# Patient Record
Sex: Female | Born: 1940
Health system: Southern US, Community
[De-identification: ages and names within clinical notes are randomized; demographics above are authoritative.]

## PROBLEM LIST (undated history)

## (undated) DIAGNOSIS — I10 Essential (primary) hypertension: Secondary | ICD-10-CM

## (undated) DIAGNOSIS — I209 Angina pectoris, unspecified: Secondary | ICD-10-CM

## (undated) DIAGNOSIS — E039 Hypothyroidism, unspecified: Secondary | ICD-10-CM

## (undated) DIAGNOSIS — K219 Gastro-esophageal reflux disease without esophagitis: Secondary | ICD-10-CM

## (undated) DIAGNOSIS — J449 Chronic obstructive pulmonary disease, unspecified: Secondary | ICD-10-CM

## (undated) DIAGNOSIS — M810 Age-related osteoporosis without current pathological fracture: Secondary | ICD-10-CM

## (undated) DIAGNOSIS — M4316 Spondylolisthesis, lumbar region: Secondary | ICD-10-CM

## (undated) DIAGNOSIS — I350 Nonrheumatic aortic (valve) stenosis: Secondary | ICD-10-CM

## (undated) DIAGNOSIS — M1990 Unspecified osteoarthritis, unspecified site: Secondary | ICD-10-CM

## (undated) DIAGNOSIS — K449 Diaphragmatic hernia without obstruction or gangrene: Secondary | ICD-10-CM

## (undated) DIAGNOSIS — E785 Hyperlipidemia, unspecified: Secondary | ICD-10-CM

## (undated) DIAGNOSIS — R0600 Dyspnea, unspecified: Secondary | ICD-10-CM

## (undated) DIAGNOSIS — C801 Malignant (primary) neoplasm, unspecified: Secondary | ICD-10-CM

## (undated) DIAGNOSIS — J189 Pneumonia, unspecified organism: Secondary | ICD-10-CM

## (undated) HISTORY — DX: Chronic obstructive pulmonary disease, unspecified: J44.9

## (undated) HISTORY — DX: Angina pectoris, unspecified: I20.9

## (undated) HISTORY — PX: APPENDECTOMY: SHX54

## (undated) HISTORY — DX: Unspecified osteoarthritis, unspecified site: M19.90

## (undated) HISTORY — PX: CERVICAL DISC ARTHROPLASTY: SHX587

## (undated) HISTORY — DX: Age-related osteoporosis without current pathological fracture: M81.0

## (undated) HISTORY — PX: COLONOSCOPY: SHX174

## (undated) HISTORY — DX: Essential (primary) hypertension: I10

## (undated) HISTORY — DX: Diaphragmatic hernia without obstruction or gangrene: K44.9

## (undated) HISTORY — DX: Hyperlipidemia, unspecified: E78.5

## (undated) HISTORY — PX: CATARACT EXTRACTION, BILATERAL: SHX1313

## (undated) HISTORY — DX: Gastro-esophageal reflux disease without esophagitis: K21.9

## (undated) HISTORY — DX: Malignant (primary) neoplasm, unspecified: C80.1

---

## 1999-04-24 ENCOUNTER — Other Ambulatory Visit: Admission: RE | Admit: 1999-04-24 | Discharge: 1999-04-24 | Payer: Self-pay | Admitting: Obstetrics and Gynecology

## 2000-06-15 ENCOUNTER — Encounter: Admission: RE | Admit: 2000-06-15 | Discharge: 2000-06-15 | Payer: Self-pay | Admitting: Internal Medicine

## 2000-06-15 ENCOUNTER — Encounter: Payer: Self-pay | Admitting: Internal Medicine

## 2000-07-19 ENCOUNTER — Encounter (INDEPENDENT_AMBULATORY_CARE_PROVIDER_SITE_OTHER): Payer: Self-pay

## 2000-07-19 ENCOUNTER — Ambulatory Visit (HOSPITAL_COMMUNITY): Admission: RE | Admit: 2000-07-19 | Discharge: 2000-07-19 | Payer: Self-pay | Admitting: *Deleted

## 2000-07-19 HISTORY — PX: ESOPHAGOGASTRODUODENOSCOPY: SHX1529

## 2001-07-25 ENCOUNTER — Other Ambulatory Visit: Admission: RE | Admit: 2001-07-25 | Discharge: 2001-07-25 | Payer: Self-pay | Admitting: Obstetrics and Gynecology

## 2003-07-03 ENCOUNTER — Other Ambulatory Visit: Admission: RE | Admit: 2003-07-03 | Discharge: 2003-07-03 | Payer: Self-pay | Admitting: Obstetrics and Gynecology

## 2003-08-08 ENCOUNTER — Ambulatory Visit (HOSPITAL_COMMUNITY): Admission: RE | Admit: 2003-08-08 | Discharge: 2003-08-08 | Payer: Self-pay | Admitting: Cardiology

## 2004-12-22 ENCOUNTER — Other Ambulatory Visit: Admission: RE | Admit: 2004-12-22 | Discharge: 2004-12-22 | Payer: Self-pay | Admitting: Obstetrics and Gynecology

## 2007-04-03 ENCOUNTER — Ambulatory Visit (HOSPITAL_COMMUNITY): Admission: RE | Admit: 2007-04-03 | Discharge: 2007-04-04 | Payer: Self-pay | Admitting: Orthopaedic Surgery

## 2009-07-03 ENCOUNTER — Encounter: Admission: RE | Admit: 2009-07-03 | Discharge: 2009-07-03 | Payer: Self-pay | Admitting: Internal Medicine

## 2009-07-23 ENCOUNTER — Inpatient Hospital Stay (HOSPITAL_COMMUNITY): Admission: RE | Admit: 2009-07-23 | Discharge: 2009-07-23 | Payer: Self-pay | Admitting: Neurosurgery

## 2009-10-23 ENCOUNTER — Encounter (HOSPITAL_COMMUNITY): Admission: RE | Admit: 2009-10-23 | Discharge: 2010-01-09 | Payer: Self-pay | Admitting: Internal Medicine

## 2009-11-18 ENCOUNTER — Encounter: Admission: RE | Admit: 2009-11-18 | Discharge: 2009-11-18 | Payer: Self-pay | Admitting: Neurosurgery

## 2010-01-23 ENCOUNTER — Encounter (HOSPITAL_COMMUNITY): Admission: RE | Admit: 2010-01-23 | Discharge: 2010-04-02 | Payer: Self-pay | Admitting: Internal Medicine

## 2010-04-09 ENCOUNTER — Encounter (HOSPITAL_COMMUNITY)
Admission: RE | Admit: 2010-04-09 | Discharge: 2010-06-02 | Payer: Self-pay | Source: Home / Self Care | Attending: Internal Medicine | Admitting: Internal Medicine

## 2010-05-25 ENCOUNTER — Encounter
Admission: RE | Admit: 2010-05-25 | Discharge: 2010-05-25 | Payer: Self-pay | Source: Home / Self Care | Attending: Gastroenterology | Admitting: Gastroenterology

## 2010-07-20 ENCOUNTER — Ambulatory Visit (HOSPITAL_COMMUNITY): Payer: Medicare Other | Attending: Internal Medicine

## 2010-07-20 DIAGNOSIS — M810 Age-related osteoporosis without current pathological fracture: Secondary | ICD-10-CM | POA: Insufficient documentation

## 2010-07-26 LAB — BASIC METABOLIC PANEL
BUN: 12 mg/dL (ref 6–23)
CO2: 29 mEq/L (ref 19–32)
Calcium: 9.8 mg/dL (ref 8.4–10.5)
Chloride: 103 mEq/L (ref 96–112)
Creatinine, Ser: 1.06 mg/dL (ref 0.4–1.2)
GFR calc Af Amer: >60 mL/min (ref >60)
GFR calc non Af Amer: 51 mL/min — ABNORMAL LOW (ref >60)
Glucose, Bld: 77 mg/dL (ref 70–99)
Potassium: 3.8 mEq/L (ref 3.5–5.1)
Sodium: 139 mEq/L (ref 135–145)

## 2010-07-26 LAB — CBC
HCT: 36.6 % (ref 36.0–46.0)
Hemoglobin: 12.2 g/dL (ref 12.0–15.0)
MCHC: 33.4 g/dL (ref 30.0–36.0)
MCV: 81.7 fL (ref 78.0–100.0)
Platelets: 473 10*3/uL — ABNORMAL HIGH (ref 150–400)
RBC: 4.48 MIL/uL (ref 3.87–5.11)
RDW: 17.3 % — ABNORMAL HIGH (ref 11.5–15.5)
WBC: 9.9 10*3/uL (ref 4.0–10.5)

## 2010-07-26 LAB — SURGICAL PCR SCREEN
MRSA, PCR: NEGATIVE
Staphylococcus aureus: NEGATIVE

## 2010-09-15 NOTE — Op Note (Signed)
Joann Barnes, Joann Barnes                 ACCOUNT NO.:  [id number]   MEDICAL RECORD NO.:  [id number]          PATIENT TYPE:  OIB   LOCATION:  5033                         FACILITY:  [facility]   PHYSICIAN:  Mark C. Ophelia Charter, M.D.    DATE OF BIRTH:  [birth year]   DATE OF PROCEDURE:  04/03/2007  DATE OF DISCHARGE:                               OPERATIVE REPORT   PREOPERATIVE DIAGNOSIS:  C4-5 spondylosis.   POSTOPERATIVE DIAGNOSIS:  C4-5 spondylosis.   PROCEDURE:  C4-5 anterior cervical diskectomy and fusion with allograft  and plating.   SURGEON:  Mark C. Ophelia Charter, M.D.   ASSISTANT:  Wende Neighbors, P.A.   ANESTHESIA:  GOT plus Marcaine skin local 5 mL.   ESTIMATED BLOOD LOSS:  Minimal.   DRAINS:  One Hemovac neck.   This patient had a previous cervical fusion at C5-6 and had  pseudoarthrosis and eventually had posterior wiring.  She has done well  for many years, then developed progressive spondylosis above the fusion,  with pain primarily axial and neck and shoulder pain.  MRI scan showed  spondylitic changes above the fusion.  All other levels looked good.  Her fusion had healed solidly after the posterior grafting and wiring.   PROCEDURE:  After induction of general anesthesia and ___HH_______  traction, prepping the neck with DuraPrep, the area was squared with  towels.  Betadine and VyDrape applied.  Sterile Mayo stand at the head,  followed by sheets and drapes.  The old incision  was used, after time-  out procedure was discussed and confirmed with all in the room.  Old  incision was opened.  Platysma was elevated proximally.  Blunt  dissection down and above the omohyoid was performed on the level of  longus colli.  There was a prominent band over some scar tissue, running  directly over the periosteum.  A needle was placed in a prominent spur.  It turned out this was the C3-4; one level above the operative level --  which made it even easier since this was closer in line with the  skin  incision.   After moving the needle one level down; cross-table lateral x-ray  confirmed this was C4-5.  A portion of the disk was removed with a 15  scalpel blade, and then ___teethed_______ Cloward plates were placed  right and left; smooth blades up and down.  Longus coli was used to  protect the esophagus on the right side.  Diskectomy was performed with  a scalpel blade and Cloward curets.  Following curets,  power bur and  then rasp.  Operative microscope was draped and brought in.  The  posterior longitudinal ligament was taken down.  Large spurs were  removed.  The disk space was relatively narrow and would barely allow  the 4 mm bur to fit.  Once spurs were trimmed back, the dura was  visualized and uncovertebral joints were stripped.  The bone spurs were  removed from that area.  Traction was applied by the C.R.N.A. and the 6  mm graft was inserted with tight fit; countersunk 1-2  mm.  Then a 16 mm  plate was selected.  I would confirm by checking AP and lateral  fluoroscopy and all four holes were then filled.  The hole where the  prong had been holding did not require hand drilling, and just had screw  insertion.  AP and lateral fluoroscopic pictures confirmed excellent  position and alignment.  The patient tolerated the procedure  well.  The wound was irrigated.  Hemovac was placed with in-and-out  technique in line with the skin incision.  The platysma closed with 3-0  Vicryl; 4-0 Vicryl subcuticular closure.  Tincture of Benzoin, Steri-  Strips, 4x4s, soft collar and tape.  The patient tolerated the procedure  well and taken to recovery room.      Mark C. Ophelia Charter, M.D.  Electronically Signed     MCY/MEDQ  D:  04/03/2007  T:  04/04/2007  Job:  [id number]

## 2010-09-18 NOTE — Procedures (Signed)
[facility]  Patient:    Joann Barnes, Joann Barnes                        MRN: [id number] Proc. Date: 07/19/00 Adm. Date:  [id number] Attending:  Sabino Gasser                           Procedure Report  PROCEDURE:  Upper endoscopy with biopsy.  INDICATION FOR PROCEDURE:  Reflux.  ANESTHESIA:  Demerol 70 mg, Versed 7 mg.  DESCRIPTION OF PROCEDURE:  With the patient mildly sedated in the left lateral decubitus position, the Olympus video endoscope was inserted in the mouth and passed under direct vision through the esophagus which appeared normal. The distal esophagus was approached and appeared relatively normal. We photographed this area and entered into the stomach. The fundus, body, antrum, duodenal bulb, and second portion of the duodenum were all well visualized and appeared normal. From this point, the endoscope was slowly withdrawn taking circumferential views of the entire duodenal mucosa until the endoscope was then pulled back into the stomach, placed in retroflexion to view the stomach from below and the gastroesophageal junction was widely open allowing a view of the distal esophagus from the stomach and this was photographed. The endoscope was then straightened and withdrawn taking circumferential views of the entire gastric and subsequently esophageal mucosa stopping only in the distal esophagus to biopsy the GE junction area to rule out Barretts. The endoscope was withdrawn. The patients vital signs and pulse oximeter remained stable. The patient tolerated the procedure well without apparent complications.  FINDINGS:  Broad Barretts esophagus by biopsy although this is probably normal. Large hiatal hernia as evidenced above.  PLAN:  Continue aggressive antireflux measures. Will have the patient follow-up with me as an outpatient. DD:  07/19/00 TD:  07/20/00 Job: [id number]

## 2010-10-21 ENCOUNTER — Encounter: Payer: Self-pay | Admitting: Internal Medicine

## 2010-10-22 ENCOUNTER — Ambulatory Visit (INDEPENDENT_AMBULATORY_CARE_PROVIDER_SITE_OTHER): Payer: Medicare Other | Admitting: Internal Medicine

## 2010-10-22 ENCOUNTER — Encounter: Payer: Self-pay | Admitting: Internal Medicine

## 2010-10-22 VITALS — BP 140/66 | HR 86 | Temp 98.3°F | Ht 66.0 in | Wt 158.8 lb

## 2010-10-22 DIAGNOSIS — R0600 Dyspnea, unspecified: Secondary | ICD-10-CM | POA: Insufficient documentation

## 2010-10-22 DIAGNOSIS — R0989 Other specified symptoms and signs involving the circulatory and respiratory systems: Secondary | ICD-10-CM

## 2010-10-22 DIAGNOSIS — F17200 Nicotine dependence, unspecified, uncomplicated: Secondary | ICD-10-CM

## 2010-10-22 DIAGNOSIS — R0609 Other forms of dyspnea: Secondary | ICD-10-CM

## 2010-10-22 NOTE — Assessment & Plan Note (Addendum)
Likely Due to copd or deconditioning or both. Doubt angina equivalent. Med technique and compliance could be issue as well. Did not desaturate walking 185 feet x 3 laps  Plan Full PFT Fu with NP for med calendar and PFT review and medication adjustment REfer pulmonary rehab for deconditoning AT fu will check alpha 1

## 2010-10-22 NOTE — Assessment & Plan Note (Signed)
3 min counseling done. For now work on quittin by self. Referred to [facility]. Counseling at rehab should help too. At fu wil discuss zyban

## 2010-10-22 NOTE — Progress Notes (Signed)
Subjective:    Patient ID: Joann Barnes, female    DOB: [birth year], 70 y.o.   MRN: [id number]  Shortness of Breath This is a new (70 year old overweight female.  Smoker 1/2 ppd minium since age 51) problem. The current episode started more than 1 year ago (Insidous onset, several years ago). The problem occurs daily. The problem has been gradually worsening (Progressively worse past few months. Unable to breahe as deep as before. Currently dyspnic walking to mail box and back. Notices it dueing yard work as well). Pertinent negatives include no abdominal pain, chest pain, claudication, coryza, ear pain, fever, headaches, hemoptysis, leg swelling, neck pain, orthopnea, PND, rash, rhinorrhea, sore throat, sputum production, swollen glands, syncope, vomiting or wheezing. The symptoms are aggravated by exercise, any activity and weather changes (heat makes dyspnea worse. Walking to mail box and back makes dyspnea worse). Associated symptoms comments: Feels she is unable to take a deep breath. Asspociated cough + but feels currently unable to expectorate. Reports normal cardiac stress test by Dr Aleen Campi 2005 approximately. Reports normal resting annual ekg. Risk factors include smoking. She has tried ipratropium inhalers and oral steroids (is on spiriva currently along with albuterol but no relief. has been on prednisone bursts with cold and cough which has helped  always. Sh e is confused about her medications) for the symptoms. The treatment provided no relief. There is no history of allergies, aspirin allergies, asthma, bronchiolitis, CAD, chronic lung disease, COPD, DVT, a heart failure, PE, pneumonia or a recent surgery. (Cxr nov 2008 reviewed - suggests hyperinflation. CT abd lung cut  05/25/2010 - kyphotic, possible emphsyemai in lung field)  Walking 185 feet x 3 laps in office she did not desaturate. Though she denies COPD, I noticed on med list she is on spirivia and advair; I did not get a chance to  question her about it   Review of Systems  Constitutional: Negative for fever and unexpected weight change.  HENT: Negative for ear pain, nosebleeds, congestion, sore throat, rhinorrhea, sneezing, trouble swallowing, neck pain, dental problem, postnasal drip and sinus pressure.   Eyes: Negative for redness and itching.  Respiratory: Positive for cough and shortness of breath. Negative for hemoptysis, sputum production, chest tightness and wheezing.   Cardiovascular: Negative for chest pain, palpitations, orthopnea, claudication, leg swelling, syncope and PND.  Gastrointestinal: Negative for nausea, vomiting and abdominal pain.  Genitourinary: Negative for dysuria.  Musculoskeletal: Positive for joint swelling.  Skin: Negative for rash.  Neurological: Negative for headaches.  Hematological: Does not bruise/bleed easily.  Psychiatric/Behavioral: Negative for dysphoric mood. The patient is not nervous/anxious.        Objective:   Physical Exam  Vitals reviewed. Constitutional: She is oriented to person, place, and time. She appears well-developed and well-nourished. No distress.  HENT:  Head: Normocephalic and atraumatic.  Right Ear: External ear normal.  Left Ear: External ear normal.  Mouth/Throat: Oropharynx is clear and moist. No oropharyngeal exudate.  Eyes: Conjunctivae and EOM are normal. Pupils are equal, round, and reactive to light. Right eye exhibits no discharge. Left eye exhibits no discharge. No scleral icterus.  Neck: Normal range of motion. Neck supple. No JVD present. No tracheal deviation present. No thyromegaly present.  Cardiovascular: Normal rate, regular rhythm, normal heart sounds and intact distal pulses.  Exam reveals no gallop and no friction rub.   No murmur heard. Pulmonary/Chest: Effort normal. No respiratory distress. She has wheezes. She has no rales. She exhibits no tenderness.  No distress Faint wheeze only Full sentences No acc muscle use    Abdominal: Soft. Bowel sounds are normal. She exhibits no distension and no mass. There is no tenderness. There is no rebound and no guarding.  Musculoskeletal: Normal range of motion. She exhibits no edema and no tenderness.  Lymphadenopathy:    She has no cervical adenopathy.  Neurological: She is alert and oriented to person, place, and time. She has normal reflexes. No cranial nerve deficit. She exhibits normal muscle tone. Coordination normal.  Skin: Skin is warm and dry. No rash noted. She is not diaphoretic. No erythema. No pallor.  Psychiatric: She has a normal mood and affect. Her behavior is normal. Judgment and thought content normal.          Assessment & Plan:

## 2010-10-22 NOTE — Patient Instructions (Signed)
#  Shortness of Breath -You likely have copd -You need to breathing test called PFT -Followup PFT results with my nurse Tammy. Based on results, she will adjust your medications -When you see Tammy please bring all your medications with you for her to do a med calendar and adjust your medications -I am also setting up pulmonary rehab exercise program for you which will help you #smoking  - work on quitting by self  - my nurse will give you contact for [facility] - at followup will discuss zyban #Followoup   With NP Tammy after PFT and for med calendar

## 2010-10-28 ENCOUNTER — Ambulatory Visit (HOSPITAL_COMMUNITY)
Admission: RE | Admit: 2010-10-28 | Discharge: 2010-10-28 | Disposition: A | Discharge status: Home / Self Care | Payer: Medicare Other | Source: Ambulatory Visit | Attending: Internal Medicine | Admitting: Internal Medicine

## 2010-10-28 DIAGNOSIS — M810 Age-related osteoporosis without current pathological fracture: Secondary | ICD-10-CM | POA: Insufficient documentation

## 2010-11-09 ENCOUNTER — Ambulatory Visit (INDEPENDENT_AMBULATORY_CARE_PROVIDER_SITE_OTHER): Payer: Medicare Other | Admitting: Internal Medicine

## 2010-11-09 DIAGNOSIS — R0989 Other specified symptoms and signs involving the circulatory and respiratory systems: Secondary | ICD-10-CM

## 2010-11-09 DIAGNOSIS — R0609 Other forms of dyspnea: Secondary | ICD-10-CM

## 2010-11-09 DIAGNOSIS — R0600 Dyspnea, unspecified: Secondary | ICD-10-CM

## 2010-11-09 DIAGNOSIS — F17200 Nicotine dependence, unspecified, uncomplicated: Secondary | ICD-10-CM

## 2010-11-09 LAB — PULMONARY FUNCTION TEST

## 2010-11-09 NOTE — Progress Notes (Signed)
PFT done today. 

## 2010-11-16 ENCOUNTER — Ambulatory Visit (INDEPENDENT_AMBULATORY_CARE_PROVIDER_SITE_OTHER)
Admission: RE | Admit: 2010-11-16 | Discharge: 2010-11-16 | Disposition: A | Discharge status: Home / Self Care | Payer: Medicare Other | Source: Ambulatory Visit | Attending: Adult Health | Admitting: Adult Health

## 2010-11-16 ENCOUNTER — Encounter: Payer: Self-pay | Admitting: Adult Health

## 2010-11-16 ENCOUNTER — Ambulatory Visit (INDEPENDENT_AMBULATORY_CARE_PROVIDER_SITE_OTHER): Payer: Medicare Other | Admitting: Adult Health

## 2010-11-16 ENCOUNTER — Other Ambulatory Visit: Payer: Self-pay | Admitting: *Deleted

## 2010-11-16 VITALS — BP 132/74 | HR 74 | Temp 96.9°F | Ht 66.0 in | Wt 157.4 lb

## 2010-11-16 DIAGNOSIS — J449 Chronic obstructive pulmonary disease, unspecified: Secondary | ICD-10-CM

## 2010-11-16 MED ORDER — FLUTICASONE PROPIONATE HFA 110 MCG/ACT IN AERO: 2.0000 | INHALATION_SPRAY | Freq: Two times a day (BID) | RESPIRATORY_TRACT | Status: DC

## 2010-11-16 MED ORDER — ATORVASTATIN CALCIUM 20 MG PO TABS: 20.0000 mg | ORAL_TABLET | Freq: Every day | ORAL | Status: DC

## 2010-11-16 MED ORDER — BUPROPION HCL ER (SMOKING DET) 150 MG PO TB12: 150.0000 mg | ORAL_TABLET | Freq: Two times a day (BID) | ORAL | Status: AC

## 2010-11-16 MED ORDER — OMEPRAZOLE 20 MG PO CPDR: 20.0000 mg | DELAYED_RELEASE_CAPSULE | Freq: Every day | ORAL | Status: DC

## 2010-11-16 MED ORDER — IBANDRONATE SODIUM 3 MG/3ML IV SOLN: 3.0000 mg | INTRAVENOUS | Status: DC

## 2010-11-16 MED ORDER — IPRATROPIUM-ALBUTEROL 0.5-2.5 (3) MG/3ML IN SOLN: 3.0000 mL | RESPIRATORY_TRACT | Status: DC | PRN

## 2010-11-16 MED ORDER — BUDESONIDE 3 MG PO CP24: ORAL_CAPSULE | ORAL | Status: DC

## 2010-11-16 MED ORDER — DM-GUAIFENESIN ER 30-600 MG PO TB12
1.0000 | ORAL_TABLET | Freq: Two times a day (BID) | ORAL | Status: AC | PRN
Start: 2010-11-16 — End: 2010-11-26

## 2010-11-16 MED ORDER — ALBUTEROL 90 MCG/ACT IN AERS: 2.0000 | INHALATION_SPRAY | RESPIRATORY_TRACT | Status: DC | PRN

## 2010-11-16 NOTE — Patient Instructions (Signed)
Begin Zyban 150mg  Twice daily  For smoking cesstation for 3 months  Set taget date to quit and then  No smoking after 1 week .  Use sugarless candy , gum, stress reducers .  Continue on Spiriva daily  Most important goal is to quit smoking.  follow up Dr. Marchelle Gearing in 4-6 weeks

## 2010-11-16 NOTE — Progress Notes (Signed)
Subjective:    Patient ID: Joann Barnes, female    DOB: [birth year], 70 y.o.   MRN: [id number]  HPI 70 yo female, smoker, began at age 51 ~1PPD   seen for initial consult 10/22/10 for dyspnea PFT 11/09/2010>>FEV1 2.00 l/m  (92%), ratio 68, no sign. Change with SABA Decreased mid flows 42%, DLCO 66%    10/22/10 Initial Consult  This is a new (70 year old overweight female.  Smoker 1/2 ppd minium since age 89) problem. The current episode started more than 1 year ago (Insidous onset, several years ago). The problem occurs daily. The problem has been gradually worsening (Progressively worse past few months. Unable to breahe as deep as before. Currently dyspnic walking to mail box and back. Notices it dueing yard work as well). Pertinent negatives include no abdominal pain, chest pain, claudication, coryza, ear pain, fever, headaches, hemoptysis, leg swelling, neck pain, orthopnea, PND, rash, rhinorrhea, sore throat, sputum production, swollen glands, syncope, vomiting or wheezing. The symptoms are aggravated by exercise, any activity and weather changes (heat makes dyspnea worse. Walking to mail box and back makes dyspnea worse). Associated symptoms comments: Feels she is unable to take a deep breath. Asspociated cough + but feels currently unable to expectorate. Reports normal cardiac stress test by Dr Aleen Campi 2005 approximately. Reports normal resting annual ekg. Risk factors include smoking. She has tried ipratropium inhalers and oral steroids (is on spiriva currently along with albuterol but no relief. has been on prednisone bursts with cold and cough which has helped  always. Sh e is confused about her medications) for the symptoms. The treatment provided no relief. There is no history of allergies, aspirin allergies, asthma, bronchiolitis, CAD, chronic lung disease, COPD, DVT, a heart failure, PE, pneumonia or a recent surgery. (Cxr nov 2008 reviewed - suggests hyperinflation. CT abd lung cut  05/25/2010 -  kyphotic, possible emphsyemai in lung field)  Walking 185 feet x 3 laps in office she did not desaturate. Though she denies COPD, I noticed on med list she is on spirivia and advair; I did not get a chance to question her about it >>referred to Pulm . Rehab.   11/16/2010 Follow up and Med review.  Pt returns for follow up and med review.  We reviewed all her meds and organized med into a med calendar with pt education .  Pt wants to start on Zyban to help with quitting smoking. She has tried Chantix in past with intolerance.  Nicotine patches cause localized rash.  She is currently on flovent and spiriva daily . Last week she had PFT which showed preserved lung fxn . Mid flows were decreased   PFT 11/09/2010>>FEV1 2.00 l/m  (92%), ratio 68, no sign. Change with SABA Decreased mid flows 42%, DLCO 66%        Review of Systems Constitutional:   No  weight loss, night sweats,  Fevers, chills, fatigue, or  lassitude.  HEENT:   No headaches,  Difficulty swallowing,  Tooth/dental problems, or  Sore throat,                No sneezing, itching, ear ache, nasal congestion, post nasal drip,   CV:  No chest pain,  Orthopnea, PND, swelling in lower extremities, anasarca, dizziness, palpitations, syncope.   GI  No heartburn, indigestion, abdominal pain, nausea, vomiting, diarrhea, change in bowel habits, loss of appetite, bloody stools.   Resp:    No coughing up of blood.  No change in color of mucus.  No wheezing.  No chest wall deformity  Skin: no rash or lesions.  GU: no dysuria, change in color of urine, no urgency or frequency.  No flank pain, no hematuria   MS:  No joint pain or swelling.  No decreased range of motion.  No back pain.  Psych:  No change in mood or affect. No depression or anxiety.  No memory loss.         Objective:   Physical Exam GEN: A/Ox3; pleasant , NAD,   HEENT:  [place]/AT,  EACs-clear, TMs-wnl, NOSE-clear, THROAT-clear, no lesions, no postnasal drip or exudate  noted.   NECK:  Supple w/ fair ROM; no JVD; normal carotid impulses w/o bruits; no thyromegaly or nodules palpated; no lymphadenopathy.  RESP  Clear  P & A; w/o, wheezes/ rales/ or rhonchi.no accessory muscle use, no dullness to percussion  CARD:  RRR, no m/r/g  , no peripheral edema, pulses intact, no cyanosis or clubbing.  GI:   Soft & nt; nml bowel sounds; no organomegaly or masses detected.  Musco: Warm bil, no deformities or joint swelling noted.   Neuro: alert, no focal deficits noted.    Skin: Warm, no lesions or rashes         Assessment & Plan:

## 2010-11-16 NOTE — Progress Notes (Signed)
7.16.12 med calendar update.

## 2010-11-16 NOTE — Assessment & Plan Note (Signed)
Mild COPD w/ reduction in mid flows on PFTs Advised on smoking cesstation -has agreed to begin on Zyban Helpful hints given.  Pulmonary rehab pending.   cxr pending. (no xray since 2011)

## 2011-01-08 ENCOUNTER — Encounter: Payer: Self-pay | Admitting: Internal Medicine

## 2011-01-08 ENCOUNTER — Ambulatory Visit (INDEPENDENT_AMBULATORY_CARE_PROVIDER_SITE_OTHER): Payer: Medicare Other | Admitting: Internal Medicine

## 2011-01-08 VITALS — BP 140/80 | HR 72 | Temp 97.8°F | Ht 66.0 in | Wt 157.8 lb

## 2011-01-08 DIAGNOSIS — F17200 Nicotine dependence, unspecified, uncomplicated: Secondary | ICD-10-CM

## 2011-01-08 DIAGNOSIS — Z23 Encounter for immunization: Secondary | ICD-10-CM

## 2011-01-08 DIAGNOSIS — J449 Chronic obstructive pulmonary disease, unspecified: Secondary | ICD-10-CM

## 2011-01-08 NOTE — Patient Instructions (Signed)
#  COPD  - continue medications per med calendar  - have flu shot today  - we will refer you to  Pulmonary rehab today - gold stage 1 copd and dyspnea  - next visit check alpha 1 genetic test for copd #Smoking  - cut down to 1 tablet zyban daily - take it after meals  - drink lot of water  - 1 week after starting zyban stop smoking - no need for nicotine patch for now #Followup  - 1 month with nurse Tammy or sooner if needed

## 2011-01-08 NOTE — Assessment & Plan Note (Signed)
Stable disease. GOld stage 1 copd (done while on spiriva and flovent)  PLAN #COPD  - continue medications per med calendar  - have flu shot today  - we will refer you to  Pulmonary rehab today - gold stage 1 copd and dyspnea  - next visit check alpha 1 genetic test for copd

## 2011-01-08 NOTE — Assessment & Plan Note (Addendum)
Intolerance to twice daily zyban. Having same problem as with chantix - dyspnea and nausea. Also, rash with nicotine patch. Benadryl for itching and rash making her sleepy  PLAN -= 5 minutes counseling   - RESTART ZYBAN AT  1 tablet zyban  daily - take it after meals; hopefull this will avoid nausea  - drink lot of water  - 1 week after starting zyban stop smoking

## 2011-01-08 NOTE — Progress Notes (Signed)
Subjective:    Patient ID: Joann Barnes, female    DOB: 1940-07-01, 70 y.o.   MRN: [id number]  HPI  70 yo female, smoker, began at age 53 ~1PPD    10/22/10 Initial Consult  DYspnea: This is a new (70 year old overweight female.  Smoker 1/2 ppd minium since age 59) problem. The current episode started more than 1 year ago (Insidous onset, several years ago). The problem occurs daily. The problem has been gradually worsening (Progressively worse past few months. Unable to breahe as deep as before. Currently dyspnic walking to mail box and back. Notices it dueing yard work as well). Pertinent negatives include no abdominal pain, chest pain, claudication, coryza, ear pain, fever, headaches, hemoptysis, leg swelling, neck pain, orthopnea, PND, rash, rhinorrhea, sore throat, sputum production, swollen glands, syncope, vomiting or wheezing. The symptoms are aggravated by exercise, any activity and weather changes (heat makes dyspnea worse. Walking to mail box and back makes dyspnea worse). Associated symptoms comments: Feels she is unable to take a deep breath. Asspociated cough + but feels currently unable to expectorate. Reports normal cardiac stress test by Dr Aleen Campi 2005 approximately. Reports normal resting annual ekg. Risk factors include smoking. She has tried ipratropium inhalers and oral steroids (is on spiriva currently along with albuterol but no relief. has been on prednisone bursts with cold and cough which has helped  always. Sh e is confused about her medications) for the symptoms. The treatment provided no relief. There is no history of allergies, aspirin allergies, asthma, bronchiolitis, CAD, chronic lung disease, COPD, DVT, a heart failure, PE, pneumonia or a recent surgery. (Cxr nov 2008 reviewed - suggests hyperinflation. CT abd lung cut  05/25/2010 - kyphotic, possible emphsyemai in lung field)  Walking 185 feet x 3 laps in office she did not desaturate. Though she denies COPD, I noticed on  med list she is on spirivia and advair; I did not get a chance to question her about it >>referred to Pulm . Rehab.    OV 01/08/2011:  Followup Gold stage 1 copd and Smoking. Last OV NP started her on zyban but having same intolerance to it like chantix; nausea and dyspnea. So she quit zyban. She started off with bid regimen on zyban and was not taking it after meals. Nicotine patch made her itch and local rash. Benadryl for it made sleepy. In terms of dyspnea, compliant with spiriva and inhaled steroid. Clas 1-2 dyspnea is stable. She is willing to attend rehab. Willing to have flu shot today. No cough. Some associated baseline edema that is unchanged. No fever, no sputum.  PFT 11/09/2010>>FEV1 2.00 l/m  (92%), ratio 68, no significant change with SABA. Decreased mid flows 42%, DLCO 66%  And reduced. RV 133% and positive for air trapping  Past Medical History  Diagnosis Date  . Hyperlipidemia   . HTN (hypertension)   . Other and unspecified angina pectoris   . GERD (gastroesophageal reflux disease)   . Hernia, hiatal   . Migraine   . OA (osteoarthritis)   . OP (osteoporosis)   . Hematuria   . COPD (chronic obstructive pulmonary disease)      Family History  Problem Relation Age of Onset  . Heart disease Mother   . Heart disease Father   . Heart disease Brother   . Cancer Sister   . Cancer Sister      History   Social History  . Marital Status: Widowed    Spouse Name: N/A  Number of Children: N/A  . Years of Education: N/A   Occupational History  . retired    Social History Main Topics  . Smoking status: Current Everyday Smoker -- 0.5 packs/day for 45 years    Types: Cigarettes  . Smokeless tobacco: Not on file   Comment: intolerance to chantix 2010 - got rash and got admited. Failed nicotine patch/gum - itching.  [place] tried zyban/wellbutrin  . Alcohol Use: Not on file  . Drug Use: Not on file  . Sexually Active: Not on file   Other Topics Concern  . Not on file    Social History Narrative  . No narrative on file     Allergies  Allergen Reactions  . Zyban (Bupropion Hcl) Shortness Of Breath and Nausea And Vomiting  . Ambien     Bad dreams  . Codeine Itching and Nausea And Vomiting  . Valium Nausea And Vomiting  . Benadryl (Altaryl)     drowsiness  . Chantix (Varenicline Tartrate) Hives and Rash  . Nicotine     Rash and itch with patch locally     Outpatient Prescriptions Prior to Visit  Medication Sig Dispense Refill  . albuterol (PROVENTIL,VENTOLIN) 90 MCG/ACT inhaler Inhale 2 puffs into the lungs every 4 (four) hours as needed for wheezing or shortness of breath.      Marland Kitchen amLODipine (NORVASC) 5 MG tablet Take 5 mg by mouth daily.        Marland Kitchen atorvastatin (LIPITOR) 20 MG tablet Take 1 tablet (20 mg total) by mouth at bedtime.      . budesonide (ENTOCORT EC) 3 MG 24 hr capsule 1 every 8 hours as needed for colitis flare      . fluticasone (FLOVENT HFA) 110 MCG/ACT inhaler Inhale 2 puffs into the lungs 2 (two) times daily.      . hydrochlorothiazide 25 MG tablet Take 25 mg by mouth daily.        Marland Kitchen ibandronate (BONIVA) 3 MG/3ML SOLN injection Inject 3 mLs (3 mg total) into the vein every 3 (three) months.      Marland Kitchen ipratropium-albuterol (DUONEB) 0.5-2.5 (3) MG/3ML SOLN Take 3 mLs by nebulization every 4 (four) hours as needed (for wheezing / shortness of breath).      Marland Kitchen levothyroxine (LEVOXYL) 25 MCG tablet Take 25 mcg by mouth daily.        Marland Kitchen omeprazole (PRILOSEC) 20 MG capsule Take 1 capsule (20 mg total) by mouth daily before breakfast.      . sertraline (ZOLOFT) 100 MG tablet Take 1 tablet by mouth Daily.      Marland Kitchen tiotropium (SPIRIVA) 18 MCG inhalation capsule Place 18 mcg into inhaler and inhale daily.           Review of Systems  Constitutional: Negative for fever and unexpected weight change.  HENT: Negative for ear pain, nosebleeds, congestion, sore throat, rhinorrhea, sneezing, trouble swallowing, dental problem, postnasal drip and sinus  pressure.   Eyes: Negative for redness and itching.  Respiratory: Negative for cough, chest tightness, shortness of breath and wheezing.   Cardiovascular: Negative for palpitations and leg swelling.  Gastrointestinal: Negative for nausea and vomiting.  Genitourinary: Negative for dysuria.  Musculoskeletal: Negative for joint swelling.  Skin: Negative for rash.  Neurological: Negative for headaches.  Hematological: Does not bruise/bleed easily.  Psychiatric/Behavioral: Negative for dysphoric mood. The patient is not nervous/anxious.            Objective:   Physical Exam  GEN: A/Ox3; pleasant ,  NAD,   HEENT:  [place]/AT,  EACs-clear, TMs-wnl, NOSE-clear, THROAT-clear, no lesions, no postnasal drip or exudate noted.   NECK:  Supple w/ fair ROM; no JVD; normal carotid impulses w/o bruits; no thyromegaly or nodules palpated; no lymphadenopathy.  RESP  Clear  P & A; w/o, wheezes/ rales/ or rhonchi.no accessory muscle use, no dullness to percussion  CARD:  RRR, no m/r/g  , no peripheral edema, pulses intact, no cyanosis or clubbing.  GI:   Soft & nt; nml bowel sounds; no organomegaly or masses detected.  Musco: Warm bil, no deformities or joint swelling noted.   Neuro: alert, no focal deficits noted.    Skin: Warm, no lesions or rashes         Assessment & Plan:

## 2011-01-20 ENCOUNTER — Encounter (HOSPITAL_COMMUNITY): Payer: Medicare Other

## 2011-02-08 ENCOUNTER — Ambulatory Visit: Payer: Medicare Other | Admitting: Adult Health

## 2011-02-09 ENCOUNTER — Encounter: Payer: Self-pay | Admitting: Adult Health

## 2011-02-09 ENCOUNTER — Ambulatory Visit (INDEPENDENT_AMBULATORY_CARE_PROVIDER_SITE_OTHER): Payer: Medicare Other | Admitting: Adult Health

## 2011-02-09 VITALS — BP 140/70 | HR 76 | Temp 97.6°F | Ht 66.0 in | Wt 162.8 lb

## 2011-02-09 DIAGNOSIS — J449 Chronic obstructive pulmonary disease, unspecified: Secondary | ICD-10-CM

## 2011-02-09 LAB — URINE MICROSCOPIC-ADD ON

## 2011-02-09 LAB — DIFFERENTIAL
Basophils Absolute: 0
Basophils Relative: 0
Eosinophils Absolute: 0 — ABNORMAL LOW
Eosinophils Relative: 1
Lymphocytes Relative: 21
Lymphs Abs: 2.1
Monocytes Absolute: 0.8
Monocytes Relative: 8
Neutro Abs: 7
Neutrophils Relative %: 70

## 2011-02-09 LAB — COMPREHENSIVE METABOLIC PANEL
ALT: 17
AST: 22
Albumin: 4.2
Alkaline Phosphatase: 90
BUN: 12
CO2: 28
Calcium: 9.7
Chloride: 100
Creatinine, Ser: 1.25 — ABNORMAL HIGH
GFR calc Af Amer: 52 — ABNORMAL LOW
GFR calc non Af Amer: 43 — ABNORMAL LOW
Glucose, Bld: 77
Potassium: 3.6
Sodium: 136
Total Bilirubin: 0.8
Total Protein: 6.9

## 2011-02-09 LAB — URINALYSIS, ROUTINE W REFLEX MICROSCOPIC
Bilirubin Urine: NEGATIVE
Glucose, UA: NEGATIVE
Hgb urine dipstick: NEGATIVE
Ketones, ur: 15 — AB
Nitrite: NEGATIVE
Protein, ur: NEGATIVE
Specific Gravity, Urine: 1.023
Urobilinogen, UA: 1
pH: 7

## 2011-02-09 LAB — CBC
HCT: 40.8
Hemoglobin: 13.7
MCHC: 33.5
MCV: 88.7
Platelets: 454 — ABNORMAL HIGH
RBC: 4.59
RDW: 13.6
WBC: 9.9

## 2011-02-09 LAB — PROTIME-INR
INR: 0.9
Prothrombin Time: 12.8

## 2011-02-09 LAB — APTT: aPTT: 28

## 2011-02-09 NOTE — Assessment & Plan Note (Signed)
Compensated on present regimen Alpha 1 today  Encouraged on smoking cesstation .   Plan;

## 2011-02-09 NOTE — Patient Instructions (Signed)
Continue on Spiriva  daily  MOST IMPORTANT GOAL IS TO QUIT SMOKING We will call with Alpha 1 genetic test.  follow up Dr. Marchelle Gearing in 2 months  And As needed

## 2011-02-09 NOTE — Progress Notes (Signed)
Subjective:    Patient ID: Joann Barnes, female    DOB: 07-05-1940, 70 y.o.   MRN: [id number]  HPI 70 yo female, smoker, began at age 53 ~1PPD   10/22/10 Initial Consult  DYspnea: This is a new (70 year old overweight female.  Smoker 1/2 ppd minium since age 57) problem. The current episode started more than 1 year ago (Insidous onset, several years ago). The problem occurs daily. The problem has been gradually worsening (Progressively worse past few months. Unable to breahe as deep as before. Currently dyspnic walking to mail box and back. Notices it dueing yard work as well). Pertinent negatives include no abdominal pain, chest pain, claudication, coryza, ear pain, fever, headaches, hemoptysis, leg swelling, neck pain, orthopnea, PND, rash, rhinorrhea, sore throat, sputum production, swollen glands, syncope, vomiting or wheezing. The symptoms are aggravated by exercise, any activity and weather changes (heat makes dyspnea worse. Walking to mail box and back makes dyspnea worse). Associated symptoms comments: Feels she is unable to take a deep breath. Asspociated cough + but feels currently unable to expectorate. Reports normal cardiac stress test by Dr Aleen Campi 2005 approximately. Reports normal resting annual ekg. Risk factors include smoking. She has tried ipratropium inhalers and oral steroids (is on spiriva currently along with albuterol but no relief. has been on prednisone bursts with cold and cough which has helped  always. Sh e is confused about her medications) for the symptoms. The treatment provided no relief. There is no history of allergies, aspirin allergies, asthma, bronchiolitis, CAD, chronic lung disease, COPD, DVT, a heart failure, PE, pneumonia or a recent surgery. (Cxr nov 2008 reviewed - suggests hyperinflation. CT abd lung cut  05/25/2010 - kyphotic, possible emphsyemai in lung field)  Walking 185 feet x 3 laps in office she did not desaturate. Though she denies COPD, I noticed on med  list she is on spirivia and advair; I did not get a chance to question her about it >>referred to Pulm . Rehab.    OV 01/08/2011:  Followup Gold stage 1 copd and Smoking. Last OV NP started her on zyban but having same intolerance to it like chantix; nausea and dyspnea. So she quit zyban. She started off with bid regimen on zyban and was not taking it after meals. Nicotine patch made her itch and local rash. Benadryl for it made sleepy. In terms of dyspnea, compliant with spiriva and inhaled steroid. Clas 1-2 dyspnea is stable. She is willing to attend rehab. Willing to have flu shot today. No cough. Some associated baseline edema that is unchanged. No fever, no sputum.  PFT 11/09/2010>>FEV1 2.00 l/m  (92%), ratio 68, no significant change with SABA. Decreased mid flows 42%, DLCO 66%  And reduced. RV 133% and positive for air trapping >>restarted Zyban   02/09/2011 Follow UP OV  Pt returns for follow up .  Last visit , restarted on Zyban . Unable to tolerate due to nausea and anxiety.  Plans on electronic cigarette this week.  Tolerating Spiriva well.  No flare , dyspnea, ER or Hospital visits. No hemoptysis or weight loss.     Past Medical History  Diagnosis Date  . Hyperlipidemia   . HTN (hypertension)   . Other and unspecified angina pectoris   . GERD (gastroesophageal reflux disease)   . Hernia, hiatal   . Migraine   . OA (osteoarthritis)   . OP (osteoporosis)   . Hematuria   . COPD (chronic obstructive pulmonary disease)  Family History  Problem Relation Age of Onset  . Heart disease Mother   . Heart disease Father   . Heart disease Brother   . Cancer Sister   . Cancer Sister      History   Social History  . Marital Status: Widowed    Spouse Name: N/A    Number of Children: N/A  . Years of Education: N/A   Occupational History  . retired    Social History Main Topics  . Smoking status: Current Everyday Smoker -- 0.5 packs/day for 45 years    Types: Cigarettes    . Smokeless tobacco: Not on file   Comment: intolerance to chantix 2010 - got rash and got admited. Failed nicotine patch/gum - itching.  [place] tried zyban/wellbutrin  . Alcohol Use: Not on file  . Drug Use: Not on file  . Sexually Active: Not on file   Other Topics Concern  . Not on file   Social History Narrative  . No narrative on file     Allergies  Allergen Reactions  . Zyban (Bupropion Hcl) Shortness Of Breath and Nausea And Vomiting  . Ambien     Bad dreams  . Codeine Itching and Nausea And Vomiting  . Valium Nausea And Vomiting  . Benadryl (Altaryl)     drowsiness  . Chantix (Varenicline Tartrate) Hives and Rash  . Nicotine     Rash and itch with patch locally     Outpatient Prescriptions Prior to Visit  Medication Sig Dispense Refill  . albuterol (PROVENTIL,VENTOLIN) 90 MCG/ACT inhaler Inhale 2 puffs into the lungs every 4 (four) hours as needed for wheezing or shortness of breath.      Marland Kitchen amLODipine (NORVASC) 5 MG tablet Take 5 mg by mouth daily.        Marland Kitchen atorvastatin (LIPITOR) 20 MG tablet Take 1 tablet (20 mg total) by mouth at bedtime.      . budesonide (ENTOCORT EC) 3 MG 24 hr capsule 1 every 8 hours as needed for colitis flare      . fluticasone (FLOVENT HFA) 110 MCG/ACT inhaler Inhale 2 puffs into the lungs 2 (two) times daily.      . hydrochlorothiazide 25 MG tablet Take 25 mg by mouth daily.        Marland Kitchen ibandronate (BONIVA) 3 MG/3ML SOLN injection Inject 3 mLs (3 mg total) into the vein every 3 (three) months.      Marland Kitchen ipratropium-albuterol (DUONEB) 0.5-2.5 (3) MG/3ML SOLN Take 3 mLs by nebulization every 4 (four) hours as needed (for wheezing / shortness of breath).      Marland Kitchen levothyroxine (LEVOXYL) 25 MCG tablet Take 25 mcg by mouth daily.        Marland Kitchen omeprazole (PRILOSEC) 20 MG capsule Take 1 capsule (20 mg total) by mouth daily before breakfast.      . sertraline (ZOLOFT) 100 MG tablet Take 1 tablet by mouth Daily.      Marland Kitchen tiotropium (SPIRIVA) 18 MCG inhalation  capsule Place 18 mcg into inhaler and inhale daily.           Review of Systems  Constitutional: Negative for fever and unexpected weight change.  HENT: Negative for ear pain, nosebleeds, congestion, sore throat, rhinorrhea, sneezing, trouble swallowing, dental problem, postnasal drip and sinus pressure.   Eyes: Negative for redness and itching.  Respiratory: Negative for cough, chest tightness Cardiovascular: Negative for palpitations and leg swelling.  Gastrointestinal: Negative for nausea and vomiting.  Genitourinary: Negative for dysuria.  Musculoskeletal: Negative for joint swelling.  Skin: Negative for rash.  Neurological: Negative for headaches.  Hematological: Does not bruise/bleed easily.  Psychiatric/Behavioral: Negative for dysphoric mood. The patient + nervous/anxious.            Objective:   Physical Exam  GEN: A/Ox3; pleasant , NAD,   HEENT:  [place]/AT,  EACs-clear, TMs-wnl, NOSE-clear, THROAT-clear, no lesions, no postnasal drip or exudate noted.   NECK:  Supple w/ fair ROM; no JVD; normal carotid impulses w/o bruits; no thyromegaly or nodules palpated; no lymphadenopathy.  RESP  Coarse BS : no accessory muscle use, no dullness to percussion  CARD:  RRR, no m/r/g  , no peripheral edema, pulses intact, no cyanosis or clubbing.  GI:   Soft & nt; nml bowel sounds; no organomegaly or masses detected.  Musco: Warm bil, no deformities or joint swelling noted.   Neuro: alert, no focal deficits noted.    Skin: Warm, no lesions or rashes         Assessment & Plan:

## 2011-03-04 ENCOUNTER — Encounter: Payer: Self-pay | Admitting: Internal Medicine

## 2011-03-18 ENCOUNTER — Encounter: Payer: Self-pay | Admitting: Internal Medicine

## 2011-03-23 ENCOUNTER — Other Ambulatory Visit (HOSPITAL_COMMUNITY): Payer: Self-pay | Admitting: *Deleted

## 2011-03-29 ENCOUNTER — Encounter (HOSPITAL_COMMUNITY)
Admission: RE | Admit: 2011-03-29 | Discharge: 2011-03-29 | Disposition: A | Discharge status: Home / Self Care | Payer: Medicare Other | Source: Ambulatory Visit | Attending: Internal Medicine | Admitting: Internal Medicine

## 2011-03-29 DIAGNOSIS — M1990 Unspecified osteoarthritis, unspecified site: Secondary | ICD-10-CM | POA: Insufficient documentation

## 2011-03-29 DIAGNOSIS — M810 Age-related osteoporosis without current pathological fracture: Secondary | ICD-10-CM | POA: Insufficient documentation

## 2011-03-29 MED ORDER — IBANDRONATE SODIUM 3 MG/3ML IV SOLN
3.0000 mg | Freq: Once | INTRAVENOUS | Status: AC
  Administered 2011-03-29: 3 mg via INTRAVENOUS
  Filled 2011-03-29: qty 3

## 2011-04-12 ENCOUNTER — Ambulatory Visit: Payer: Medicare Other | Admitting: Internal Medicine

## 2011-07-21 ENCOUNTER — Other Ambulatory Visit (HOSPITAL_COMMUNITY): Payer: Self-pay | Admitting: *Deleted

## 2011-07-26 ENCOUNTER — Encounter (HOSPITAL_COMMUNITY)
Admission: RE | Admit: 2011-07-26 | Discharge: 2011-07-26 | Disposition: A | Discharge status: Home / Self Care | Payer: Medicare Other | Source: Ambulatory Visit | Attending: Internal Medicine | Admitting: Internal Medicine

## 2011-07-26 DIAGNOSIS — M1990 Unspecified osteoarthritis, unspecified site: Secondary | ICD-10-CM | POA: Insufficient documentation

## 2011-07-26 DIAGNOSIS — M810 Age-related osteoporosis without current pathological fracture: Secondary | ICD-10-CM | POA: Insufficient documentation

## 2011-07-26 MED ORDER — IBANDRONATE SODIUM 3 MG/3ML IV SOLN
3.0000 mg | Freq: Once | INTRAVENOUS | Status: AC
  Administered 2011-07-26: 3 mg via INTRAVENOUS
  Filled 2011-07-26: qty 3

## 2011-07-26 MED ORDER — SODIUM CHLORIDE 0.9 % IV SOLN
Freq: Once | INTRAVENOUS | Status: AC
  Administered 2011-07-26: 10:00:00 via INTRAVENOUS

## 2011-09-10 ENCOUNTER — Other Ambulatory Visit: Payer: Self-pay | Admitting: Neurosurgery

## 2011-09-10 DIAGNOSIS — IMO0002 Reserved for concepts with insufficient information to code with codable children: Secondary | ICD-10-CM

## 2011-09-10 DIAGNOSIS — M545 Low back pain, unspecified: Secondary | ICD-10-CM

## 2011-09-16 ENCOUNTER — Ambulatory Visit
Admission: RE | Admit: 2011-09-16 | Discharge: 2011-09-16 | Disposition: A | Discharge status: Home / Self Care | Payer: Medicare Other | Source: Ambulatory Visit | Attending: Neurosurgery | Admitting: Neurosurgery

## 2011-09-16 DIAGNOSIS — M545 Low back pain, unspecified: Secondary | ICD-10-CM

## 2011-09-16 DIAGNOSIS — IMO0002 Reserved for concepts with insufficient information to code with codable children: Secondary | ICD-10-CM

## 2011-09-16 MED ORDER — GADOBENATE DIMEGLUMINE 529 MG/ML IV SOLN
15.0000 mL | Freq: Once | INTRAVENOUS | Status: AC | PRN
  Administered 2011-09-16: 15 mL via INTRAVENOUS

## 2011-10-25 ENCOUNTER — Other Ambulatory Visit (HOSPITAL_COMMUNITY): Payer: Self-pay | Admitting: *Deleted

## 2011-10-26 ENCOUNTER — Encounter (HOSPITAL_COMMUNITY)
Admission: RE | Admit: 2011-10-26 | Discharge: 2011-10-26 | Disposition: A | Discharge status: Home / Self Care | Payer: Medicare Other | Source: Ambulatory Visit | Attending: Internal Medicine | Admitting: Internal Medicine

## 2011-10-26 DIAGNOSIS — M810 Age-related osteoporosis without current pathological fracture: Secondary | ICD-10-CM | POA: Insufficient documentation

## 2011-10-26 DIAGNOSIS — M1990 Unspecified osteoarthritis, unspecified site: Secondary | ICD-10-CM | POA: Insufficient documentation

## 2011-10-26 MED ORDER — IBANDRONATE SODIUM 3 MG/3ML IV SOLN
3.0000 mg | Freq: Once | INTRAVENOUS | Status: AC
  Administered 2011-10-26: 3 mg via INTRAVENOUS
  Filled 2011-10-26: qty 3

## 2011-10-26 MED ORDER — SODIUM CHLORIDE 0.9 % IV SOLN
Freq: Once | INTRAVENOUS | Status: AC
  Administered 2011-10-26: 10:00:00 via INTRAVENOUS

## 2012-01-17 ENCOUNTER — Other Ambulatory Visit (HOSPITAL_COMMUNITY): Payer: Self-pay | Admitting: *Deleted

## 2012-01-18 ENCOUNTER — Encounter (HOSPITAL_COMMUNITY)
Admission: RE | Admit: 2012-01-18 | Discharge: 2012-01-18 | Disposition: A | Discharge status: Home / Self Care | Payer: Medicare Other | Source: Ambulatory Visit | Attending: Internal Medicine | Admitting: Internal Medicine

## 2012-01-18 DIAGNOSIS — M810 Age-related osteoporosis without current pathological fracture: Secondary | ICD-10-CM | POA: Insufficient documentation

## 2012-01-18 MED ORDER — IBANDRONATE SODIUM 3 MG/3ML IV SOLN
3.0000 mg | Freq: Once | INTRAVENOUS | Status: AC
  Administered 2012-01-18: 3 mg via INTRAVENOUS
  Filled 2012-01-18: qty 3

## 2012-01-18 MED ORDER — SODIUM CHLORIDE 0.9 % IV SOLN
Freq: Once | INTRAVENOUS | Status: AC
  Administered 2012-01-18: 250 mL via INTRAVENOUS

## 2012-04-17 ENCOUNTER — Other Ambulatory Visit (HOSPITAL_COMMUNITY): Payer: Self-pay | Admitting: *Deleted

## 2012-04-18 ENCOUNTER — Encounter (HOSPITAL_COMMUNITY)
Admission: RE | Admit: 2012-04-18 | Discharge: 2012-04-18 | Disposition: A | Discharge status: Home / Self Care | Payer: Medicare Other | Source: Ambulatory Visit | Attending: Internal Medicine | Admitting: Internal Medicine

## 2012-04-18 DIAGNOSIS — M810 Age-related osteoporosis without current pathological fracture: Secondary | ICD-10-CM | POA: Insufficient documentation

## 2012-04-18 MED ORDER — IBANDRONATE SODIUM 3 MG/3ML IV SOLN
3.0000 mg | Freq: Once | INTRAVENOUS | Status: AC
  Administered 2012-04-18: 3 mg via INTRAVENOUS

## 2012-04-18 MED ORDER — IBANDRONATE SODIUM 3 MG/3ML IV SOLN
INTRAVENOUS | Status: AC
  Filled 2012-04-18: qty 3

## 2012-04-18 MED ORDER — SODIUM CHLORIDE 0.9 % IV SOLN
INTRAVENOUS | Status: DC
  Administered 2012-04-18: 10:00:00 via INTRAVENOUS

## 2012-05-03 HISTORY — PX: HAND SURGERY: SHX662

## 2012-09-01 ENCOUNTER — Other Ambulatory Visit (HOSPITAL_COMMUNITY): Payer: Self-pay | Admitting: *Deleted

## 2012-09-04 ENCOUNTER — Encounter (HOSPITAL_COMMUNITY)
Admission: RE | Admit: 2012-09-04 | Discharge: 2012-09-04 | Disposition: A | Discharge status: Home / Self Care | Payer: Medicare Other | Source: Ambulatory Visit | Attending: Internal Medicine | Admitting: Internal Medicine

## 2012-09-04 DIAGNOSIS — M810 Age-related osteoporosis without current pathological fracture: Secondary | ICD-10-CM | POA: Insufficient documentation

## 2012-09-04 MED ORDER — IBANDRONATE SODIUM 3 MG/3ML IV SOLN
3.0000 mg | Freq: Once | INTRAVENOUS | Status: AC
  Administered 2012-09-04: 3 mg via INTRAVENOUS

## 2012-09-04 MED ORDER — IBANDRONATE SODIUM 3 MG/3ML IV SOLN
INTRAVENOUS | Status: AC
  Filled 2012-09-04: qty 3

## 2012-12-04 ENCOUNTER — Other Ambulatory Visit (HOSPITAL_COMMUNITY): Payer: Self-pay | Admitting: *Deleted

## 2012-12-05 ENCOUNTER — Encounter (HOSPITAL_COMMUNITY)
Admission: RE | Admit: 2012-12-05 | Discharge: 2012-12-05 | Disposition: A | Discharge status: Home / Self Care | Payer: Medicare Other | Source: Ambulatory Visit | Attending: Internal Medicine | Admitting: Internal Medicine

## 2012-12-05 DIAGNOSIS — M810 Age-related osteoporosis without current pathological fracture: Secondary | ICD-10-CM | POA: Insufficient documentation

## 2012-12-05 MED ORDER — SODIUM CHLORIDE 0.9 % IV SOLN
Freq: Once | INTRAVENOUS | Status: AC
  Administered 2012-12-05: 250 mL via INTRAVENOUS

## 2012-12-05 MED ORDER — IBANDRONATE SODIUM 3 MG/3ML IV SOLN
3.0000 mg | Freq: Once | INTRAVENOUS | Status: AC
  Administered 2012-12-05: 3 mg via INTRAVENOUS

## 2012-12-05 MED ORDER — IBANDRONATE SODIUM 3 MG/3ML IV SOLN
INTRAVENOUS | Status: AC
  Administered 2012-12-05: 3 mg via INTRAVENOUS
  Filled 2012-12-05: qty 3

## 2013-03-05 ENCOUNTER — Other Ambulatory Visit (HOSPITAL_COMMUNITY): Payer: Self-pay | Admitting: *Deleted

## 2013-03-06 ENCOUNTER — Encounter (HOSPITAL_COMMUNITY)
Admission: RE | Admit: 2013-03-06 | Discharge: 2013-03-06 | Disposition: A | Discharge status: Home / Self Care | Payer: Medicare Other | Source: Ambulatory Visit | Attending: Internal Medicine | Admitting: Internal Medicine

## 2013-03-06 DIAGNOSIS — M810 Age-related osteoporosis without current pathological fracture: Secondary | ICD-10-CM | POA: Insufficient documentation

## 2013-03-06 MED ORDER — IBANDRONATE SODIUM 3 MG/3ML IV SOLN
INTRAVENOUS | Status: AC
  Administered 2013-03-06: 3 mg via INTRAVENOUS
  Filled 2013-03-06: qty 3

## 2013-03-06 MED ORDER — IBANDRONATE SODIUM 3 MG/3ML IV SOLN
3.0000 mg | Freq: Once | INTRAVENOUS | Status: AC
  Administered 2013-03-06: 3 mg via INTRAVENOUS

## 2013-06-04 ENCOUNTER — Other Ambulatory Visit (HOSPITAL_COMMUNITY): Payer: Self-pay | Admitting: *Deleted

## 2013-06-05 ENCOUNTER — Encounter (HOSPITAL_COMMUNITY)
Admission: RE | Admit: 2013-06-05 | Discharge: 2013-06-05 | Disposition: A | Discharge status: Home / Self Care | Payer: Medicare Other | Source: Ambulatory Visit | Attending: Internal Medicine | Admitting: Internal Medicine

## 2013-06-05 DIAGNOSIS — M810 Age-related osteoporosis without current pathological fracture: Secondary | ICD-10-CM | POA: Insufficient documentation

## 2013-06-05 MED ORDER — IBANDRONATE SODIUM 3 MG/3ML IV SOLN
3.0000 mg | Freq: Once | INTRAVENOUS | Status: AC
  Administered 2013-06-05: 3 mg via INTRAVENOUS

## 2013-06-05 MED ORDER — SODIUM CHLORIDE 0.9 % IV SOLN
Freq: Once | INTRAVENOUS | Status: AC
  Administered 2013-06-05: 10:00:00 via INTRAVENOUS

## 2013-06-05 MED ORDER — IBANDRONATE SODIUM 3 MG/3ML IV SOLN
INTRAVENOUS | Status: AC
  Filled 2013-06-05: qty 3

## 2013-09-04 ENCOUNTER — Encounter (HOSPITAL_COMMUNITY)
Admission: RE | Admit: 2013-09-04 | Discharge: 2013-09-04 | Disposition: A | Discharge status: Home / Self Care | Payer: Medicare Other | Source: Ambulatory Visit | Attending: Internal Medicine | Admitting: Internal Medicine

## 2013-09-04 DIAGNOSIS — M810 Age-related osteoporosis without current pathological fracture: Secondary | ICD-10-CM | POA: Insufficient documentation

## 2013-09-04 MED ORDER — IBANDRONATE SODIUM 3 MG/3ML IV SOLN
INTRAVENOUS | Status: AC
  Administered 2013-09-04: 3 mg via INTRAVENOUS
  Filled 2013-09-04: qty 3

## 2013-09-04 MED ORDER — IBANDRONATE SODIUM 3 MG/3ML IV SOLN
3.0000 mg | Freq: Once | INTRAVENOUS | Status: AC
  Administered 2013-09-04: 3 mg via INTRAVENOUS

## 2013-12-04 ENCOUNTER — Other Ambulatory Visit (HOSPITAL_COMMUNITY): Payer: Self-pay | Admitting: *Deleted

## 2013-12-05 ENCOUNTER — Encounter (HOSPITAL_COMMUNITY)
Admission: RE | Admit: 2013-12-05 | Discharge: 2013-12-05 | Disposition: A | Discharge status: Home / Self Care | Payer: Medicare Other | Source: Ambulatory Visit | Attending: Internal Medicine | Admitting: Internal Medicine

## 2013-12-05 DIAGNOSIS — M810 Age-related osteoporosis without current pathological fracture: Secondary | ICD-10-CM | POA: Diagnosis not present

## 2013-12-05 MED ORDER — IBANDRONATE SODIUM 3 MG/3ML IV SOLN
3.0000 mg | Freq: Once | INTRAVENOUS | Status: AC
  Administered 2013-12-05: 3 mg via INTRAVENOUS

## 2013-12-05 MED ORDER — SODIUM CHLORIDE 0.9 % IV SOLN: INTRAVENOUS | Status: DC

## 2013-12-05 MED ORDER — IBANDRONATE SODIUM 3 MG/3ML IV SOLN
INTRAVENOUS | Status: AC
  Filled 2013-12-05: qty 3

## 2014-03-05 ENCOUNTER — Other Ambulatory Visit (HOSPITAL_COMMUNITY): Payer: Self-pay | Admitting: *Deleted

## 2014-03-06 ENCOUNTER — Encounter (HOSPITAL_COMMUNITY): Payer: Self-pay

## 2014-03-12 ENCOUNTER — Encounter (HOSPITAL_COMMUNITY)
Admission: RE | Admit: 2014-03-12 | Discharge: 2014-03-12 | Disposition: A | Discharge status: Home / Self Care | Payer: Medicare Other | Source: Ambulatory Visit | Attending: Internal Medicine | Admitting: Internal Medicine

## 2014-03-12 DIAGNOSIS — M810 Age-related osteoporosis without current pathological fracture: Secondary | ICD-10-CM | POA: Diagnosis present

## 2014-03-12 MED ORDER — IBANDRONATE SODIUM 3 MG/3ML IV SOLN
3.0000 mg | INTRAVENOUS | Status: DC
  Administered 2014-03-12: 3 mg via INTRAVENOUS

## 2014-03-12 MED ORDER — IBANDRONATE SODIUM 3 MG/3ML IV SOLN
INTRAVENOUS | Status: AC
  Filled 2014-03-12: qty 3

## 2014-03-21 ENCOUNTER — Encounter (HOSPITAL_COMMUNITY): Payer: Medicare Other

## 2014-04-22 ENCOUNTER — Emergency Department (HOSPITAL_COMMUNITY)
Admission: EM | Admit: 2014-04-22 | Discharge: 2014-04-22 | Disposition: A | Discharge status: Home / Self Care | Payer: Medicare Other | Attending: Emergency Medicine | Admitting: Emergency Medicine

## 2014-04-22 ENCOUNTER — Emergency Department (HOSPITAL_COMMUNITY): Payer: Medicare Other

## 2014-04-22 ENCOUNTER — Encounter (HOSPITAL_COMMUNITY): Payer: Self-pay | Admitting: Family Medicine

## 2014-04-22 DIAGNOSIS — K219 Gastro-esophageal reflux disease without esophagitis: Secondary | ICD-10-CM | POA: Insufficient documentation

## 2014-04-22 DIAGNOSIS — R0789 Other chest pain: Secondary | ICD-10-CM | POA: Insufficient documentation

## 2014-04-22 DIAGNOSIS — E785 Hyperlipidemia, unspecified: Secondary | ICD-10-CM | POA: Diagnosis not present

## 2014-04-22 DIAGNOSIS — Z720 Tobacco use: Secondary | ICD-10-CM | POA: Diagnosis not present

## 2014-04-22 DIAGNOSIS — Z79899 Other long term (current) drug therapy: Secondary | ICD-10-CM | POA: Insufficient documentation

## 2014-04-22 DIAGNOSIS — Z7951 Long term (current) use of inhaled steroids: Secondary | ICD-10-CM | POA: Diagnosis not present

## 2014-04-22 DIAGNOSIS — R200 Anesthesia of skin: Secondary | ICD-10-CM | POA: Insufficient documentation

## 2014-04-22 DIAGNOSIS — R202 Paresthesia of skin: Secondary | ICD-10-CM

## 2014-04-22 DIAGNOSIS — G43909 Migraine, unspecified, not intractable, without status migrainosus: Secondary | ICD-10-CM | POA: Diagnosis not present

## 2014-04-22 DIAGNOSIS — H02401 Unspecified ptosis of right eyelid: Secondary | ICD-10-CM | POA: Insufficient documentation

## 2014-04-22 DIAGNOSIS — R079 Chest pain, unspecified: Secondary | ICD-10-CM | POA: Diagnosis present

## 2014-04-22 DIAGNOSIS — I10 Essential (primary) hypertension: Secondary | ICD-10-CM | POA: Insufficient documentation

## 2014-04-22 DIAGNOSIS — Z791 Long term (current) use of non-steroidal anti-inflammatories (NSAID): Secondary | ICD-10-CM | POA: Insufficient documentation

## 2014-04-22 DIAGNOSIS — J449 Chronic obstructive pulmonary disease, unspecified: Secondary | ICD-10-CM | POA: Diagnosis not present

## 2014-04-22 LAB — BASIC METABOLIC PANEL
Anion gap: 14 (ref 5–15)
BUN: 22 mg/dL (ref 6–23)
CO2: 23 mEq/L (ref 19–32)
Calcium: 8.9 mg/dL (ref 8.4–10.5)
Chloride: 103 mEq/L (ref 96–112)
Creatinine, Ser: 1.15 mg/dL — ABNORMAL HIGH (ref 0.50–1.10)
GFR calc Af Amer: 53 mL/min — ABNORMAL LOW (ref >90)
GFR calc non Af Amer: 46 mL/min — ABNORMAL LOW (ref >90)
Glucose, Bld: 105 mg/dL — ABNORMAL HIGH (ref 70–99)
Potassium: 4 mEq/L (ref 3.7–5.3)
Sodium: 140 mEq/L (ref 137–147)

## 2014-04-22 LAB — CBC
HCT: 37.5 % (ref 36.0–46.0)
Hemoglobin: 12.6 g/dL (ref 12.0–15.0)
MCH: 29.7 pg (ref 26.0–34.0)
MCHC: 33.6 g/dL (ref 30.0–36.0)
MCV: 88.4 fL (ref 78.0–100.0)
Platelets: 329 10*3/uL (ref 150–400)
RBC: 4.24 MIL/uL (ref 3.87–5.11)
RDW: 14.7 % (ref 11.5–15.5)
WBC: 11.5 10*3/uL — ABNORMAL HIGH (ref 4.0–10.5)

## 2014-04-22 LAB — I-STAT TROPONIN, ED: Troponin i, poc: 0.01 ng/mL (ref 0.00–0.08)

## 2014-04-22 LAB — TROPONIN I: Troponin I: <0.3 ng/mL (ref <0.30)

## 2014-04-22 LAB — PRO B NATRIURETIC PEPTIDE: Pro B Natriuretic peptide (BNP): 134.7 pg/mL — ABNORMAL HIGH (ref 0–125)

## 2014-04-22 NOTE — ED Notes (Signed)
Pt off floor with xray

## 2014-04-22 NOTE — ED Provider Notes (Signed)
CSN: [id number]     Arrival date & time 04/22/14  1804 History   First MD Initiated Contact with Patient 04/22/14 1816     Chief Complaint  Patient presents with  . Chest Pain  . Numbness      HPI  Patient presents for evaluation of 3 complaints. One is that she has some tingling in her right hand and all fingers. That she little burning in her forearm earlier in the week and this is gone. States that she's had some numbness in her fingertips for a few years. Had a tendon transfer surgery done a few years ago by a Copy. She states that she did ask about her numbness at that time was told to take B-6 and additional testing may be required. This is not different today. However the burning in her forearm a few days ago was new. This has resolved.  She states that she is also noted recently the right eyelid is "droopy" she knows [place] this when putting on her mascara. No vision changes. No erythema or an undue swelling of the face. No pupil changes. No vision changes. No lower face weakness. No right arm or leg weakness.  She states that she has some tenderness in her right chest. She she fell about 3 weeks ago and did hurt for a few days. She states that it seemed to go away. However, in the last few days it is been noticeable to her again. She states it is tender to touch on. She presents for evaluation.  He has not had cough sputum pressure difficult breathing. No difficulty with swallowing or speech. No numbness weakness of the left upper lower extremity. No difficult with balance or coordination or vision.    Past Medical History  Diagnosis Date  . Hyperlipidemia   . HTN (hypertension)   . Other and unspecified angina pectoris   . GERD (gastroesophageal reflux disease)   . Hernia, hiatal   . Migraine   . OA (osteoarthritis)   . OP (osteoporosis)   . Hematuria   . COPD (chronic obstructive pulmonary disease)    Past Surgical History  Procedure Laterality Date  . Cervical  disc arthroplasty      x 2  . Appendectomy    . Esophagogastroduodenoscopy  07/19/00   Family History  Problem Relation Age of Onset  . Heart disease Mother   . Heart disease Father   . Heart disease Brother   . Cancer Sister   . Cancer Sister    History  Substance Use Topics  . Smoking status: Current Every Day Smoker -- 0.50 packs/day for 45 years    Types: Cigarettes  . Smokeless tobacco: Not on file     Comment: intolerance to chantix 2010 - got rash and got admited. Failed nicotine patch/gum - itching.  [place] tried zyban/wellbutrin  . Alcohol Use: No   OB History    No data available     Review of Systems  Constitutional: Negative for fever, chills, diaphoresis, appetite change and fatigue.  HENT: Negative for mouth sores, sore throat and trouble swallowing.   Eyes: Negative for visual disturbance.       "Droopy lid".  Respiratory: Negative for cough, chest tightness, shortness of breath and wheezing.   Cardiovascular: Positive for chest pain.  Gastrointestinal: Negative for nausea, vomiting, abdominal pain, diarrhea and abdominal distention.  Endocrine: Negative for polydipsia, polyphagia and polyuria.  Genitourinary: Negative for dysuria, frequency and hematuria.  Musculoskeletal: Negative for gait problem.  Skin: Negative for color change, pallor and rash.  Neurological: Positive for numbness. Negative for dizziness, syncope, light-headedness and headaches.       Numbness to the tips of the right hand, all fingers.  Hematological: Does not bruise/bleed easily.  Psychiatric/Behavioral: Negative for behavioral problems and confusion.      Allergies  Bactrim; Zyban; Codeine; Zolpidem tartrate; Benadryl; Chantix; and Nicotine  Home Medications   Prior to Admission medications   Medication Sig Start Date End Date Taking? Authorizing Provider  albuterol (PROVENTIL,VENTOLIN) 90 MCG/ACT inhaler Inhale 2 puffs into the lungs every 4 (four) hours as needed for  wheezing or shortness of breath. 11/16/10   Tammy S Parrett, NP  amitriptyline (ELAVIL) 25 MG tablet Take 1 tablet by mouth At bedtime as needed. 12/08/10   Historical Provider, MD  amLODipine (NORVASC) 5 MG tablet Take 5 mg by mouth daily.      Historical Provider, MD  atorvastatin (LIPITOR) 20 MG tablet Take 1 tablet (20 mg total) by mouth at bedtime. 11/16/10   Tammy S Parrett, NP  budesonide (ENTOCORT EC) 3 MG 24 hr capsule 1 every 8 hours as needed for colitis flare 11/16/10   Tammy S Parrett, NP  escitalopram (LEXAPRO) 10 MG tablet Take 10 mg by mouth daily.    Historical Provider, MD  fluticasone (FLOVENT HFA) 110 MCG/ACT inhaler Inhale 2 puffs into the lungs 2 (two) times daily. 11/16/10   Tammy S Parrett, NP  hydrochlorothiazide 25 MG tablet Take 25 mg by mouth daily.      Historical Provider, MD  ibandronate (BONIVA) 3 MG/3ML SOLN injection Inject 3 mLs (3 mg total) into the vein every 3 (three) months. 11/16/10   Tammy S Parrett, NP  ibuprofen (ADVIL,MOTRIN) 200 MG tablet Take 400 mg by mouth 2 (two) times daily.    Historical Provider, MD  ipratropium-albuterol (DUONEB) 0.5-2.5 (3) MG/3ML SOLN Take 3 mLs by nebulization every 4 (four) hours as needed (for wheezing / shortness of breath). 11/16/10   Tammy S Parrett, NP  levothyroxine (LEVOXYL) 25 MCG tablet Take 25 mcg by mouth daily.      Historical Provider, MD  omeprazole (PRILOSEC) 20 MG capsule Take 1 capsule (20 mg total) by mouth daily before breakfast. 11/16/10   Tammy S Parrett, NP  pyridOXINE (VITAMIN B-6) 100 MG tablet Take 200 mg by mouth daily.    Historical Provider, MD  sertraline (ZOLOFT) 100 MG tablet Take 1 tablet by mouth Daily. 10/14/10   Historical Provider, MD  tiotropium (SPIRIVA) 18 MCG inhalation capsule Place 18 mcg into inhaler and inhale daily.      Historical Provider, MD   BP 154/57 mmHg  Pulse 80  Temp(Src) 98.3 F (36.8 C) (Oral)  Resp 18  SpO2 97% Physical Exam  Constitutional: She is oriented to person,  place, and time. She appears well-developed and well-nourished. No distress.  Awake alert. Interactive. Appears younger than her stated age.  HENT:  Head: Normocephalic.    Eyes: Conjunctivae are normal. Pupils are equal, round, and reactive to light. No scleral icterus.    Neck: Normal range of motion. Neck supple. No thyromegaly present.  Cardiovascular: Normal rate and regular rhythm.  Exam reveals no gallop and no friction rub.   No murmur heard. Pulmonary/Chest: Effort normal and breath sounds normal. No respiratory distress. She has no wheezes. She has no rales.    Tenderness across right lateral chest. No rash or vesicles across the chest neck back or arm.  Abdominal: Soft. Bowel  sounds are normal. She exhibits no distension. There is no tenderness. There is no rebound.  Musculoskeletal: Normal range of motion.       Arms: Neurological: She is alert and oriented to person, place, and time.  Skin: Skin is warm and dry. No rash noted.  Psychiatric: She has a normal mood and affect. Her behavior is normal.    ED Course  Procedures (including critical care time) Labs Review Labs Reviewed  CBC - Abnormal; Notable for the following:    WBC 11.5 (*)    All other components within normal limits  BASIC METABOLIC PANEL - Abnormal; Notable for the following:    Glucose, Bld 105 (*)    Creatinine, Ser 1.15 (*)    GFR calc non Af Amer 46 (*)    GFR calc Af Amer 53 (*)    All other components within normal limits  PRO B NATRIURETIC PEPTIDE - Abnormal; Notable for the following:    Pro B Natriuretic peptide (BNP) 134.7 (*)    All other components within normal limits  TROPONIN I  I-STAT TROPOININ, ED    Imaging Review Dg Chest 2 View  04/22/2014   CLINICAL DATA:  Right-sided chest pain radiating down the right arm, smoker  EXAM: CHEST  2 VIEW  COMPARISON:  11/16/2010  FINDINGS: Heart size is normal. Coarsening of the interstitial markings and hyperinflation suggests emphysema.  No pleural effusion. No focal pulmonary opacity. Biapical pleural thickening noted. Right humeral bone anchors are present.  IMPRESSION: No acute abnormality.  Emphysematous change reidentified.   Electronically Signed   By: Conchita Paris M.D.   On: 04/22/2014 21:10   Mr Brain Wo Contrast  04/22/2014   CLINICAL DATA:  73 year old hypertensive female with hyperlipidemia and history of migraine headaches presenting with right arm numbness and burning for the past 5 days. Right eye droop more than usual. Initial encounter.  EXAM: MRI HEAD WITHOUT CONTRAST  TECHNIQUE: Multiplanar, multiecho pulse sequences of the brain and surrounding structures were obtained without intravenous contrast.  COMPARISON:  None.  FINDINGS: No acute infarct.  No intracranial hemorrhage.  Minimal white matter changes.  No intracranial mass lesion noted on this unenhanced exam.  Major intracranial vascular structures are patent with what appears to be a congenitally small left vertebral artery.  No hydrocephalus.  Degenerative changes C1-2 with bony erosion of the dens and mild transverse ligament hypertrophy. Prior cervical spine surgery.  Cervical medullary junction, pituitary region, pineal region and orbital structures unremarkable.  Mild age related atrophy without hydrocephalus.  Partial opacification right mastoid air cells without obstructing lesion of the eustachian tube detected.  IMPRESSION: No acute infarct.  Partial opacification right mastoid air cells.  C1-2 degenerative changes with bony erosion of the dens and transverse ligament hypertrophy.   Electronically Signed   By: Chauncey Cruel M.D.   On: 04/22/2014 20:10     EKG Interpretation   Date/Time:  Monday April 22 2014 18:12:17 EST Ventricular Rate:  87 PR Interval:  148 QRS Duration: 74 QT Interval:  374 QTC Calculation: 450 R Axis:   -20 Text Interpretation:  Normal sinus rhythm Low voltage QRS Nonspecific ST  abnormality Abnormal ECG Confirmed by  Jeneen Rinks  MD, [place] ([id number]) on  04/22/2014 6:41:22 PM      MDM   Final diagnoses:  Chest wall pain  Ptosis, right  Numbness and tingling in right hand    Patient's evaluation is reassuring. Has a normal EKG. She has a normal troponin after  4 days of symptoms.  Has simple ptosis of the right eye without signs of Horner syndrome. No stroke on MRI. No lower facial weakness or sign of Bell's palsy. I think this may be very likely be age-related ptosis and not neurogenic.  She states that she has had the tingling in her fingers for several years. She states that her hand surgeon told her to take B-6 for a year, and if it didn't get better they would do "nerve testing". This is not changed today. She has normal exam with exception of decreased sensation to her fingertips of all digits of the right finger. Otherwise normal neurovascular exam. Her chest wall tenderness is reproducible. I think she is appropriate for outpatient treatment. Tylenol for pain. Avoid heavy lifting or exertion. Follow-up with her primary care physician in her hand surgeon.    Tanna Furry, MD 04/22/14 2131

## 2014-04-22 NOTE — ED Notes (Signed)
Pt back to floor from x-ray

## 2014-04-22 NOTE — ED Notes (Signed)
Patient transported to X-ray 

## 2014-04-22 NOTE — ED Notes (Signed)
MD at bedside. Dr. Jeneen Rinks.

## 2014-04-22 NOTE — ED Notes (Signed)
Per pt sts right sided chest pain radiating down right arm with numbness and burning since Thursday. sts also she noticed her right eye more drooped then normal. No other deficits noted.

## 2014-04-22 NOTE — ED Notes (Signed)
Patient transported to MRI 

## 2014-04-22 NOTE — Discharge Instructions (Signed)
°  Continue to follow with your hand Dr. About your right hand numbness. The drooping of your eyelid (Ptosis) is not from a stroke. It can simply be from aging and weakness of the eyelid muscle. Avoid heavy lifting, or strenuous activity until the pain in your right chest wall goes away. Tylenol for pain. Chest Wall Pain Chest wall pain is pain felt in or around the chest bones and muscles. It may take up to 6 weeks to get better. It may take longer if you are active. Chest wall pain can happen on its own. Other times, things like germs, injury, coughing, or exercise can cause the pain. HOME CARE   Avoid activities that make you tired or cause pain. Try not to use your chest, belly (abdominal), or side muscles. Do not use heavy weights.  Put ice on the sore area.  Put ice in a plastic bag.  Place a towel between your skin and the bag.  Leave the ice on for 15-20 minutes for the first 2 days.  Only take medicine as told by your doctor. GET HELP RIGHT AWAY IF:   You have more pain or are very uncomfortable.  You have a fever.  Your chest pain gets worse.  You have new problems.  You feel sick to your stomach (nauseous) or throw up (vomit).  You start to sweat or feel lightheaded.  You have a cough with mucus (phlegm).  You cough up blood. MAKE SURE YOU:   Understand these instructions.  Will watch your condition.  Will get help right away if you are not doing well or get worse. Document Released: 10/06/2007 Document Revised: 07/12/2011 Document Reviewed: 12/14/2010 [facility] Patient Information 2015 [place], [place]. This information is not intended to replace advice given to you by your health care provider. Make sure you discuss any questions you have with your health care provider.

## 2014-05-15 ENCOUNTER — Other Ambulatory Visit: Payer: Self-pay | Admitting: Orthopaedic Surgery

## 2014-05-15 DIAGNOSIS — M542 Cervicalgia: Secondary | ICD-10-CM

## 2014-05-16 ENCOUNTER — Ambulatory Visit
Admission: RE | Admit: 2014-05-16 | Discharge: 2014-05-16 | Disposition: A | Discharge status: Home / Self Care | Payer: Medicare Other | Source: Ambulatory Visit | Attending: Orthopaedic Surgery | Admitting: Orthopaedic Surgery

## 2014-05-16 DIAGNOSIS — M542 Cervicalgia: Secondary | ICD-10-CM

## 2014-06-11 ENCOUNTER — Other Ambulatory Visit (HOSPITAL_COMMUNITY): Payer: Self-pay | Admitting: *Deleted

## 2014-06-11 ENCOUNTER — Encounter (HOSPITAL_COMMUNITY)
Admission: RE | Admit: 2014-06-11 | Discharge: 2014-06-11 | Disposition: A | Discharge status: Home / Self Care | Payer: Medicare Other | Source: Ambulatory Visit | Attending: Internal Medicine | Admitting: Internal Medicine

## 2014-06-11 DIAGNOSIS — M810 Age-related osteoporosis without current pathological fracture: Secondary | ICD-10-CM | POA: Insufficient documentation

## 2014-06-11 MED ORDER — IBANDRONATE SODIUM 3 MG/3ML IV SOLN
3.0000 mg | Freq: Once | INTRAVENOUS | Status: AC
  Administered 2014-06-11: 3 mg via INTRAVENOUS

## 2014-06-11 MED ORDER — IBANDRONATE SODIUM 3 MG/3ML IV SOLN
INTRAVENOUS | Status: AC
  Administered 2014-06-11: 3 mg via INTRAVENOUS
  Filled 2014-06-11: qty 3

## 2014-09-10 ENCOUNTER — Encounter (HOSPITAL_COMMUNITY): Payer: Medicare Other

## 2014-09-11 ENCOUNTER — Encounter (HOSPITAL_COMMUNITY)
Admission: RE | Admit: 2014-09-11 | Discharge: 2014-09-11 | Disposition: A | Discharge status: Home / Self Care | Payer: Medicare Other | Source: Ambulatory Visit | Attending: Internal Medicine | Admitting: Internal Medicine

## 2014-09-11 DIAGNOSIS — M810 Age-related osteoporosis without current pathological fracture: Secondary | ICD-10-CM | POA: Insufficient documentation

## 2014-09-11 MED ORDER — IBANDRONATE SODIUM 3 MG/3ML IV SOLN
INTRAVENOUS | Status: AC
  Filled 2014-09-11: qty 3

## 2014-09-11 MED ORDER — IBANDRONATE SODIUM 3 MG/3ML IV SOLN
3.0000 mg | Freq: Once | INTRAVENOUS | Status: AC
  Administered 2014-09-11: 3 mg via INTRAVENOUS

## 2014-12-11 ENCOUNTER — Encounter (HOSPITAL_COMMUNITY)
Admission: RE | Admit: 2014-12-11 | Discharge: 2014-12-11 | Disposition: A | Discharge status: Home / Self Care | Payer: Medicare Other | Source: Ambulatory Visit | Attending: Internal Medicine | Admitting: Internal Medicine

## 2014-12-11 DIAGNOSIS — M810 Age-related osteoporosis without current pathological fracture: Secondary | ICD-10-CM | POA: Diagnosis not present

## 2014-12-11 MED ORDER — IBANDRONATE SODIUM 3 MG/3ML IV SOLN
3.0000 mg | Freq: Once | INTRAVENOUS | Status: AC
  Administered 2014-12-11: 3 mg via INTRAVENOUS

## 2014-12-11 MED ORDER — IBANDRONATE SODIUM 3 MG/3ML IV SOLN
INTRAVENOUS | Status: AC
  Filled 2014-12-11: qty 3

## 2015-03-12 ENCOUNTER — Other Ambulatory Visit (HOSPITAL_COMMUNITY): Payer: Self-pay | Admitting: *Deleted

## 2015-03-13 ENCOUNTER — Encounter (HOSPITAL_COMMUNITY)
Admission: RE | Admit: 2015-03-13 | Discharge: 2015-03-13 | Disposition: A | Discharge status: Home / Self Care | Payer: Medicare Other | Source: Ambulatory Visit | Attending: Internal Medicine | Admitting: Internal Medicine

## 2015-03-13 DIAGNOSIS — M810 Age-related osteoporosis without current pathological fracture: Secondary | ICD-10-CM | POA: Insufficient documentation

## 2015-03-13 MED ORDER — IBANDRONATE SODIUM 3 MG/3ML IV SOLN
INTRAVENOUS | Status: AC
  Filled 2015-03-13: qty 3

## 2015-03-13 MED ORDER — IBANDRONATE SODIUM 3 MG/3ML IV SOLN
3.0000 mg | Freq: Once | INTRAVENOUS | Status: AC
  Administered 2015-03-13: 3 mg via INTRAVENOUS

## 2015-06-18 ENCOUNTER — Other Ambulatory Visit (HOSPITAL_COMMUNITY): Payer: Self-pay

## 2015-06-19 ENCOUNTER — Ambulatory Visit (HOSPITAL_COMMUNITY)
Admission: RE | Admit: 2015-06-19 | Discharge: 2015-06-19 | Disposition: A | Discharge status: Home / Self Care | Payer: Medicare Other | Source: Ambulatory Visit | Attending: Internal Medicine | Admitting: Internal Medicine

## 2015-06-19 DIAGNOSIS — M810 Age-related osteoporosis without current pathological fracture: Secondary | ICD-10-CM | POA: Insufficient documentation

## 2015-06-19 MED ORDER — IBANDRONATE SODIUM 3 MG/3ML IV SOLN: 3.0000 mg | Freq: Once | INTRAVENOUS | Status: DC

## 2015-06-19 MED ORDER — IBANDRONATE SODIUM 3 MG/3ML IV SOLN
INTRAVENOUS | Status: AC
  Administered 2015-06-19: 3 mg via INTRAVENOUS
  Filled 2015-06-19: qty 3

## 2015-09-18 ENCOUNTER — Encounter (HOSPITAL_COMMUNITY)
Admission: RE | Admit: 2015-09-18 | Discharge: 2015-09-18 | Disposition: A | Discharge status: Home / Self Care | Payer: Medicare Other | Source: Ambulatory Visit | Attending: Internal Medicine | Admitting: Internal Medicine

## 2015-09-18 DIAGNOSIS — M810 Age-related osteoporosis without current pathological fracture: Secondary | ICD-10-CM | POA: Diagnosis present

## 2015-09-18 MED ORDER — IBANDRONATE SODIUM 3 MG/3ML IV SOLN
3.0000 mg | Freq: Once | INTRAVENOUS | Status: AC
  Administered 2015-09-18: 3 mg via INTRAVENOUS

## 2015-09-18 MED ORDER — IBANDRONATE SODIUM 3 MG/3ML IV SOLN
INTRAVENOUS | Status: AC
  Administered 2015-09-18: 3 mg via INTRAVENOUS
  Filled 2015-09-18: qty 3

## 2016-01-07 ENCOUNTER — Other Ambulatory Visit (HOSPITAL_COMMUNITY): Payer: Self-pay | Admitting: *Deleted

## 2016-01-08 ENCOUNTER — Encounter (HOSPITAL_COMMUNITY)
Admission: RE | Admit: 2016-01-08 | Discharge: 2016-01-08 | Disposition: A | Discharge status: Home / Self Care | Payer: Medicare Other | Source: Ambulatory Visit | Attending: Family Medicine | Admitting: Family Medicine

## 2016-01-08 DIAGNOSIS — M810 Age-related osteoporosis without current pathological fracture: Secondary | ICD-10-CM | POA: Diagnosis present

## 2016-01-08 MED ORDER — IBANDRONATE SODIUM 3 MG/3ML IV SOLN
INTRAVENOUS | Status: AC
  Filled 2016-01-08: qty 3

## 2016-01-08 MED ORDER — IBANDRONATE SODIUM 3 MG/3ML IV SOLN
3.0000 mg | Freq: Once | INTRAVENOUS | Status: AC
  Administered 2016-01-08: 3 mg via INTRAVENOUS

## 2016-01-08 NOTE — Progress Notes (Signed)
Pt tolerated boniva well, VSS upon DC, pt DC without complaint.

## 2016-02-04 ENCOUNTER — Observation Stay (HOSPITAL_COMMUNITY)
Admission: EM | Admit: 2016-02-04 | Discharge: 2016-02-06 | Disposition: A | Discharge status: Home / Self Care | Payer: Medicare Other | Attending: Internal Medicine | Admitting: Internal Medicine

## 2016-02-04 ENCOUNTER — Emergency Department (HOSPITAL_COMMUNITY): Payer: Medicare Other

## 2016-02-04 ENCOUNTER — Encounter (HOSPITAL_COMMUNITY): Payer: Self-pay | Admitting: Internal Medicine

## 2016-02-04 DIAGNOSIS — I10 Essential (primary) hypertension: Secondary | ICD-10-CM | POA: Diagnosis present

## 2016-02-04 DIAGNOSIS — E039 Hypothyroidism, unspecified: Secondary | ICD-10-CM | POA: Diagnosis not present

## 2016-02-04 DIAGNOSIS — R0789 Other chest pain: Secondary | ICD-10-CM | POA: Diagnosis present

## 2016-02-04 DIAGNOSIS — J449 Chronic obstructive pulmonary disease, unspecified: Secondary | ICD-10-CM | POA: Diagnosis present

## 2016-02-04 DIAGNOSIS — I129 Hypertensive chronic kidney disease with stage 1 through stage 4 chronic kidney disease, or unspecified chronic kidney disease: Secondary | ICD-10-CM | POA: Diagnosis not present

## 2016-02-04 DIAGNOSIS — R079 Chest pain, unspecified: Secondary | ICD-10-CM | POA: Diagnosis not present

## 2016-02-04 DIAGNOSIS — N189 Chronic kidney disease, unspecified: Secondary | ICD-10-CM | POA: Diagnosis not present

## 2016-02-04 DIAGNOSIS — F17210 Nicotine dependence, cigarettes, uncomplicated: Secondary | ICD-10-CM | POA: Insufficient documentation

## 2016-02-04 DIAGNOSIS — IMO0001 Reserved for inherently not codable concepts without codable children: Secondary | ICD-10-CM | POA: Diagnosis present

## 2016-02-04 DIAGNOSIS — Z79899 Other long term (current) drug therapy: Secondary | ICD-10-CM | POA: Diagnosis not present

## 2016-02-04 DIAGNOSIS — I350 Nonrheumatic aortic (valve) stenosis: Secondary | ICD-10-CM | POA: Diagnosis not present

## 2016-02-04 DIAGNOSIS — K219 Gastro-esophageal reflux disease without esophagitis: Secondary | ICD-10-CM | POA: Diagnosis present

## 2016-02-04 DIAGNOSIS — E7800 Pure hypercholesterolemia, unspecified: Secondary | ICD-10-CM | POA: Diagnosis not present

## 2016-02-04 DIAGNOSIS — J438 Other emphysema: Secondary | ICD-10-CM | POA: Diagnosis not present

## 2016-02-04 DIAGNOSIS — R011 Cardiac murmur, unspecified: Secondary | ICD-10-CM

## 2016-02-04 DIAGNOSIS — E7849 Other hyperlipidemia: Secondary | ICD-10-CM

## 2016-02-04 DIAGNOSIS — F17200 Nicotine dependence, unspecified, uncomplicated: Secondary | ICD-10-CM | POA: Diagnosis present

## 2016-02-04 DIAGNOSIS — E876 Hypokalemia: Secondary | ICD-10-CM | POA: Diagnosis present

## 2016-02-04 DIAGNOSIS — E785 Hyperlipidemia, unspecified: Secondary | ICD-10-CM | POA: Diagnosis present

## 2016-02-04 DIAGNOSIS — N179 Acute kidney failure, unspecified: Secondary | ICD-10-CM | POA: Diagnosis present

## 2016-02-04 DIAGNOSIS — E038 Other specified hypothyroidism: Secondary | ICD-10-CM

## 2016-02-04 DIAGNOSIS — Z720 Tobacco use: Secondary | ICD-10-CM | POA: Insufficient documentation

## 2016-02-04 HISTORY — DX: Hypothyroidism, unspecified: E03.9

## 2016-02-04 HISTORY — DX: Dyspnea, unspecified: R06.00

## 2016-02-04 LAB — TROPONIN I
Troponin I: <0.03 ng/mL (ref <0.03)
Troponin I: <0.03 ng/mL (ref <0.03)

## 2016-02-04 LAB — DIFFERENTIAL
Basophils Absolute: 0 10*3/uL (ref 0.0–0.1)
Basophils Relative: 0 %
Eosinophils Absolute: 0.2 10*3/uL (ref 0.0–0.7)
Eosinophils Relative: 1 %
Lymphocytes Relative: 10 %
Lymphs Abs: 1.4 10*3/uL (ref 0.7–4.0)
Monocytes Absolute: 1.4 10*3/uL — ABNORMAL HIGH (ref 0.1–1.0)
Monocytes Relative: 10 %
Neutro Abs: 11.5 10*3/uL — ABNORMAL HIGH (ref 1.7–7.7)
Neutrophils Relative %: 79 %

## 2016-02-04 LAB — CBC
HCT: 41.9 % (ref 36.0–46.0)
Hemoglobin: 14.2 g/dL (ref 12.0–15.0)
MCH: 31 pg (ref 26.0–34.0)
MCHC: 33.9 g/dL (ref 30.0–36.0)
MCV: 91.5 fL (ref 78.0–100.0)
Platelets: 325 10*3/uL (ref 150–400)
RBC: 4.58 MIL/uL (ref 3.87–5.11)
RDW: 14 % (ref 11.5–15.5)
WBC: 14.5 10*3/uL — ABNORMAL HIGH (ref 4.0–10.5)

## 2016-02-04 LAB — TSH: TSH: 2.428 u[IU]/mL (ref 0.350–4.500)

## 2016-02-04 LAB — LIPID PANEL
Cholesterol: 207 mg/dL — ABNORMAL HIGH (ref 0–200)
Cholesterol: 207 mg/dL — ABNORMAL HIGH (ref 0–200)
HDL: 47 mg/dL (ref >40)
HDL: 48 mg/dL (ref >40)
LDL Cholesterol: 138 mg/dL — ABNORMAL HIGH (ref 0–99)
LDL Cholesterol: 135 mg/dL — ABNORMAL HIGH (ref 0–99)
Total CHOL/HDL Ratio: 4.4 RATIO
Total CHOL/HDL Ratio: 4.3 RATIO
Triglycerides: 111 mg/dL (ref <150)
Triglycerides: 118 mg/dL (ref <150)
VLDL: 22 mg/dL (ref 0–40)
VLDL: 24 mg/dL (ref 0–40)

## 2016-02-04 LAB — APTT: aPTT: 31 seconds (ref 24–36)

## 2016-02-04 LAB — COMPREHENSIVE METABOLIC PANEL
ALT: 13 U/L — ABNORMAL LOW (ref 14–54)
AST: 14 U/L — ABNORMAL LOW (ref 15–41)
Albumin: 3.4 g/dL — ABNORMAL LOW (ref 3.5–5.0)
Alkaline Phosphatase: 58 U/L (ref 38–126)
Anion gap: 10 (ref 5–15)
BUN: 16 mg/dL (ref 6–20)
CO2: 27 mmol/L (ref 22–32)
Calcium: 9.3 mg/dL (ref 8.9–10.3)
Chloride: 101 mmol/L (ref 101–111)
Creatinine, Ser: 1.47 mg/dL — ABNORMAL HIGH (ref 0.44–1.00)
GFR calc Af Amer: 39 mL/min — ABNORMAL LOW (ref >60)
GFR calc non Af Amer: 33 mL/min — ABNORMAL LOW (ref >60)
Glucose, Bld: 104 mg/dL — ABNORMAL HIGH (ref 65–99)
Potassium: 3.4 mmol/L — ABNORMAL LOW (ref 3.5–5.1)
Sodium: 138 mmol/L (ref 135–145)
Total Bilirubin: 0.8 mg/dL (ref 0.3–1.2)
Total Protein: 6.7 g/dL (ref 6.5–8.1)

## 2016-02-04 LAB — PROTIME-INR
INR: 1.05
Prothrombin Time: 13.7 seconds (ref 11.4–15.2)

## 2016-02-04 LAB — I-STAT TROPONIN, ED: Troponin i, poc: 0 ng/mL (ref 0.00–0.08)

## 2016-02-04 MED ORDER — IBANDRONATE SODIUM 3 MG/3ML IV SOLN: 3.0000 mg | INTRAVENOUS | Status: DC

## 2016-02-04 MED ORDER — PRAVASTATIN SODIUM 40 MG PO TABS
40.0000 mg | ORAL_TABLET | Freq: Every day | ORAL | Status: DC
  Administered 2016-02-04 – 2016-02-05 (×3): 40 mg via ORAL
  Filled 2016-02-04 (×2): qty 1

## 2016-02-04 MED ORDER — GI COCKTAIL ~~LOC~~
30.0000 mL | Freq: Once | ORAL | Status: AC
  Administered 2016-02-04: 30 mL via ORAL
  Filled 2016-02-04: qty 30

## 2016-02-04 MED ORDER — ASPIRIN 81 MG PO CHEW: 324.0000 mg | CHEWABLE_TABLET | Freq: Once | ORAL | Status: DC

## 2016-02-04 MED ORDER — AMLODIPINE BESYLATE 5 MG PO TABS
5.0000 mg | ORAL_TABLET | Freq: Every day | ORAL | Status: DC
  Administered 2016-02-04 – 2016-02-06 (×3): 5 mg via ORAL
  Filled 2016-02-04 (×3): qty 1

## 2016-02-04 MED ORDER — LEVOTHYROXINE SODIUM 25 MCG PO TABS
25.0000 ug | ORAL_TABLET | Freq: Every day | ORAL | Status: DC
  Filled 2016-02-04: qty 1

## 2016-02-04 MED ORDER — DARIFENACIN HYDROBROMIDE ER 7.5 MG PO TB24
7.5000 mg | ORAL_TABLET | Freq: Every day | ORAL | Status: DC
  Filled 2016-02-04: qty 1

## 2016-02-04 MED ORDER — DICLOFENAC POTASSIUM 50 MG PO TABS: 50.0000 mg | ORAL_TABLET | Freq: Every day | ORAL | Status: DC

## 2016-02-04 MED ORDER — IBUPROFEN 200 MG PO TABS: 600.0000 mg | ORAL_TABLET | Freq: Three times a day (TID) | ORAL | Status: DC | PRN

## 2016-02-04 MED ORDER — ONDANSETRON HCL 4 MG/2ML IJ SOLN: 4.0000 mg | Freq: Four times a day (QID) | INTRAMUSCULAR | Status: DC | PRN

## 2016-02-04 MED ORDER — ALBUTEROL SULFATE (2.5 MG/3ML) 0.083% IN NEBU: 2.5000 mg | INHALATION_SOLUTION | RESPIRATORY_TRACT | Status: DC | PRN

## 2016-02-04 MED ORDER — ASPIRIN EC 325 MG PO TBEC
325.0000 mg | DELAYED_RELEASE_TABLET | Freq: Every day | ORAL | Status: DC
  Administered 2016-02-05: 325 mg via ORAL
  Filled 2016-02-04: qty 1

## 2016-02-04 MED ORDER — ALBUTEROL 90 MCG/ACT IN AERS: 2.0000 | INHALATION_SPRAY | RESPIRATORY_TRACT | Status: DC | PRN

## 2016-02-04 MED ORDER — SERTRALINE HCL 50 MG PO TABS: 100.0000 mg | ORAL_TABLET | Freq: Every day | ORAL | Status: DC

## 2016-02-04 MED ORDER — GABAPENTIN 300 MG PO CAPS
300.0000 mg | ORAL_CAPSULE | Freq: Every day | ORAL | Status: DC
  Administered 2016-02-04 – 2016-02-06 (×3): 300 mg via ORAL
  Filled 2016-02-04 (×3): qty 1

## 2016-02-04 MED ORDER — TIOTROPIUM BROMIDE MONOHYDRATE 18 MCG IN CAPS
18.0000 ug | ORAL_CAPSULE | Freq: Every day | RESPIRATORY_TRACT | Status: DC
  Administered 2016-02-05: 18 ug via RESPIRATORY_TRACT
  Filled 2016-02-04: qty 5

## 2016-02-04 MED ORDER — ENOXAPARIN SODIUM 40 MG/0.4ML ~~LOC~~ SOLN
40.0000 mg | SUBCUTANEOUS | Status: DC
  Administered 2016-02-04 – 2016-02-05 (×2): 40 mg via SUBCUTANEOUS
  Filled 2016-02-04 (×2): qty 0.4

## 2016-02-04 MED ORDER — DIAZEPAM 5 MG PO TABS: 5.0000 mg | ORAL_TABLET | Freq: Two times a day (BID) | ORAL | Status: DC | PRN

## 2016-02-04 MED ORDER — KETOROLAC TROMETHAMINE 10 MG PO TABS
10.0000 mg | ORAL_TABLET | Freq: Four times a day (QID) | ORAL | Status: DC | PRN
  Filled 2016-02-04: qty 1

## 2016-02-04 MED ORDER — ESCITALOPRAM OXALATE 10 MG PO TABS
10.0000 mg | ORAL_TABLET | Freq: Every day | ORAL | Status: DC
  Administered 2016-02-04 – 2016-02-06 (×3): 10 mg via ORAL
  Filled 2016-02-04 (×3): qty 1

## 2016-02-04 MED ORDER — GI COCKTAIL ~~LOC~~: 30.0000 mL | Freq: Four times a day (QID) | ORAL | Status: DC | PRN

## 2016-02-04 MED ORDER — POTASSIUM CHLORIDE CRYS ER 20 MEQ PO TBCR
40.0000 meq | EXTENDED_RELEASE_TABLET | Freq: Once | ORAL | Status: AC
  Administered 2016-02-04: 40 meq via ORAL
  Filled 2016-02-04: qty 2

## 2016-02-04 MED ORDER — MORPHINE SULFATE (PF) 2 MG/ML IV SOLN
2.0000 mg | INTRAVENOUS | Status: DC | PRN
  Administered 2016-02-05 – 2016-02-06 (×4): 2 mg via INTRAVENOUS
  Filled 2016-02-04 (×4): qty 1

## 2016-02-04 MED ORDER — SODIUM CHLORIDE 0.9 % IV SOLN: 10.0000 mL/h | INTRAVENOUS | Status: DC

## 2016-02-04 MED ORDER — MOMETASONE FURO-FORMOTEROL FUM 200-5 MCG/ACT IN AERO
2.0000 | INHALATION_SPRAY | Freq: Two times a day (BID) | RESPIRATORY_TRACT | Status: DC
  Administered 2016-02-04 – 2016-02-05 (×3): 2 via RESPIRATORY_TRACT
  Filled 2016-02-04: qty 8.8

## 2016-02-04 MED ORDER — PANTOPRAZOLE SODIUM 40 MG PO TBEC
40.0000 mg | DELAYED_RELEASE_TABLET | Freq: Every day | ORAL | Status: DC
  Administered 2016-02-04 – 2016-02-06 (×3): 40 mg via ORAL
  Filled 2016-02-04 (×3): qty 1

## 2016-02-04 MED ORDER — SODIUM CHLORIDE 0.9 % IV SOLN
INTRAVENOUS | Status: DC
  Administered 2016-02-04: 19:00:00 via INTRAVENOUS

## 2016-02-04 MED ORDER — TIZANIDINE HCL 4 MG PO TABS: 4.0000 mg | ORAL_TABLET | Freq: Three times a day (TID) | ORAL | Status: DC | PRN

## 2016-02-04 MED ORDER — AMITRIPTYLINE HCL 10 MG PO TABS
10.0000 mg | ORAL_TABLET | Freq: Every evening | ORAL | Status: DC | PRN
  Filled 2016-02-04: qty 1

## 2016-02-04 MED ORDER — ACETAMINOPHEN 325 MG PO TABS
650.0000 mg | ORAL_TABLET | ORAL | Status: DC | PRN
Start: 2016-02-04 — End: 2016-02-06

## 2016-02-04 MED ORDER — DICLOFENAC SODIUM 50 MG PO TBEC
50.0000 mg | DELAYED_RELEASE_TABLET | Freq: Every day | ORAL | Status: DC
  Administered 2016-02-05: 50 mg via ORAL
  Filled 2016-02-04 (×2): qty 1

## 2016-02-04 MED ORDER — ALBUTEROL SULFATE 1.25 MG/3ML IN NEBU: 1.0000 | INHALATION_SOLUTION | Freq: Two times a day (BID) | RESPIRATORY_TRACT | Status: DC | PRN

## 2016-02-04 NOTE — Consult Note (Signed)
Reason for Consult:   Chest pain  Requesting Physician: [facility] Cardiologist New  HPI:   Pleasant 75 y/o widow, lives with her sister, with a history of COPD-1ppd smoker, HTN, and HLD. She had seen Dr Glade Lloyd in 2005 for chest pain-nuclear stress then was normal. The pt tells me she developed Lt side chest pain "like an ache" that radiated to her Lt arm on Sunday prior to admission. She took a muscle relaxer with some relief. Monday she had recurrent discomfort and took "cough syrup" thinking her symptoms might be bronchitis. Tuesday she again noted chest discomfort and took one of her old NTG pills with relief of her chest pain. She made an appointment with her PCP this am and he sent to [facility] ED. She is currently pain free. Troponin negative x 2. EKG NSR with atrial bigeminy. She denies any associated nausea, vomiting, or diaphoresis. She thinks her discomfort did get worse when she used her Lt arm.   PMHx:  Past Medical History:  Diagnosis Date  . COPD (chronic obstructive pulmonary disease) ([place])   . GERD (gastroesophageal reflux disease)   . Hematuria   . Hernia, hiatal   . HTN (hypertension)   . Hyperlipidemia   . Hypothyroid   . Migraine   . OA (osteoarthritis)   . OP (osteoporosis)   . Other and unspecified angina pectoris     Past Surgical History:  Procedure Laterality Date  . APPENDECTOMY    . CERVICAL DISC ARTHROPLASTY     x 2  . ESOPHAGOGASTRODUODENOSCOPY  07/19/00    SOCHx:  reports that she has been smoking Cigarettes.  She has a 22.50 pack-year smoking history. She does not have any smokeless tobacco history on file. She reports that she does not drink alcohol or use drugs.  FAMHx: Family History  Problem Relation Age of Onset  . Heart disease Mother   . Heart disease Father   . Heart disease Brother   . Cancer Sister   . Cancer Sister     ALLERGIES: Allergies  Allergen Reactions  . Bactrim [Sulfamethoxazole-Trimethoprim]  Shortness Of Breath  . Zyban [Bupropion Hcl] Shortness Of Breath and Nausea And Vomiting  . Codeine Itching and Nausea And Vomiting  . Benadryl [Diphenhydramine Hcl] Other (See Comments)    drowsiness  . Chantix [Varenicline Tartrate] Hives and Rash  . Nicotine Other (See Comments)    Rash and itch with patch locally  . Zolpidem Tartrate Other (See Comments)    Bad dreams    ROS: Review of Systems: General: negative for chills, fever, night sweats or weight changes.  Cardiovascular: negative for dyspnea on exertion, edema, orthopnea, palpitations, paroxysmal nocturnal dyspnea or shortness of breath HEENT: negative for any visual disturbances, blindness, glaucoma Dermatological: negative for rash Respiratory: negative for cough, hemoptysis, or wheezing Urologic: negative for hematuria or dysuria Abdominal: negative for nausea, vomiting, diarrhea, bright red blood per rectum, melena, or hematemesis Neurologic: negative for visual changes, syncope, or dizziness Musculoskeletal: negative for back pain, joint pain, or swelling Psych: cooperative and appropriate All other systems reviewed and are otherwise negative except as noted above.   HOME MEDICATIONS: Prior to Admission medications   Medication Sig Start Date End Date Taking? Authorizing Provider  albuterol (ACCUNEB) 1.25 MG/3ML nebulizer solution Take 1 ampule by nebulization 2 (two) times daily as needed for wheezing or shortness of breath.   Yes Historical Provider, MD  albuterol (PROVENTIL,VENTOLIN) 90 MCG/ACT inhaler Inhale 2  puffs into the lungs every 4 (four) hours as needed for wheezing or shortness of breath. 11/16/10  Yes Tammy S Parrett, NP  amitriptyline (ELAVIL) 10 MG tablet Take 10 mg by mouth At bedtime as needed for sleep.  12/08/10  Yes Historical Provider, MD  amLODipine (NORVASC) 5 MG tablet Take 5 mg by mouth daily.     Yes Historical Provider, MD  Budesonide (UCERIS) 9 MG TB24 Take 9 mg by mouth daily as needed  (for bowels).   Yes Historical Provider, MD  diazepam (VALIUM) 5 MG tablet Take 5 mg by mouth 2 (two) times daily as needed for anxiety.   Yes Historical Provider, MD  diclofenac (CATAFLAM) 50 MG tablet Take 50 mg by mouth daily.   Yes Historical Provider, MD  escitalopram (LEXAPRO) 10 MG tablet Take 10 mg by mouth daily.   Yes Historical Provider, MD  Fluticasone-Salmeterol (ADVAIR) 250-50 MCG/DOSE AEPB Inhale 1 puff into the lungs 2 (two) times daily.   Yes Historical Provider, MD  gabapentin (NEURONTIN) 300 MG capsule Take 300 mg by mouth daily.   Yes Historical Provider, MD  hydrochlorothiazide 25 MG tablet Take 25 mg by mouth daily.     Yes Historical Provider, MD  ibandronate (BONIVA) 3 MG/3ML SOLN injection Inject 3 mLs (3 mg total) into the vein every 3 (three) months. 11/16/10  Yes Tammy S Parrett, NP  ibuprofen (ADVIL,MOTRIN) 200 MG tablet Take 600 mg by mouth every 8 (eight) hours as needed for headache or moderate pain.    Yes Historical Provider, MD  levothyroxine (LEVOXYL) 25 MCG tablet Take 25 mcg by mouth daily.     Yes Historical Provider, MD  omeprazole (PRILOSEC) 20 MG capsule Take 1 capsule (20 mg total) by mouth daily before breakfast. 11/16/10  Yes Tammy S Parrett, NP  pravastatin (PRAVACHOL) 40 MG tablet Take 40 mg by mouth at bedtime.   Yes Historical Provider, MD  solifenacin (VESICARE) 5 MG tablet Take 5 mg by mouth at bedtime.   Yes Historical Provider, MD  tiotropium (SPIRIVA) 18 MCG inhalation capsule Place 18 mcg into inhaler and inhale daily.     Yes Historical Provider, MD  tiZANidine (ZANAFLEX) 4 MG tablet Take 4 mg by mouth every 8 (eight) hours as needed for muscle spasms.   Yes Historical Provider, MD  sertraline (ZOLOFT) 100 MG tablet Take 1 tablet by mouth Daily. 10/14/10   Historical Provider, MD    HOSPITAL MEDICATIONS: I have reviewed the patient's current medications.  VITALS: Blood pressure 123/67, pulse 73, temperature 98.1 F (36.7 C), temperature  source Oral, resp. rate 18, SpO2 97 %.  PHYSICAL EXAM: General appearance: alert, cooperative and no distress Neck: no JVD and transmitted murmur to LCA Lungs: clear to auscultation bilaterally Heart: regular rate and rhythm and 2/6 systolic mumrur heard at AOV and Lt axillary area Abdomen: soft, non-tender; bowel sounds normal; no masses,  no organomegaly Extremities: extremities normal, atraumatic, no cyanosis or edema Pulses: 2+ and symmetric Skin: Skin color, texture, turgor normal. No rashes or lesions Neurologic: Grossly normal  LABS: Results for orders placed or performed during the hospital encounter of 02/04/16 (from the past 24 hour(s))  CBC     Status: Abnormal   Collection Time: 02/04/16 12:34 PM  Result Value Ref Range   WBC 14.5 (H) 4.0 - 10.5 K/uL   RBC 4.58 3.87 - 5.11 MIL/uL   Hemoglobin 14.2 12.0 - 15.0 g/dL   HCT 41.9 36.0 - 46.0 %   MCV 91.5  78.0 - 100.0 fL   MCH 31.0 26.0 - 34.0 pg   MCHC 33.9 30.0 - 36.0 g/dL   RDW 14.0 11.5 - 15.5 %   Platelets 325 150 - 400 K/uL  Differential     Status: Abnormal   Collection Time: 02/04/16 12:34 PM  Result Value Ref Range   Neutrophils Relative % 79 %   Neutro Abs 11.5 (H) 1.7 - 7.7 K/uL   Lymphocytes Relative 10 %   Lymphs Abs 1.4 0.7 - 4.0 K/uL   Monocytes Relative 10 %   Monocytes Absolute 1.4 (H) 0.1 - 1.0 K/uL   Eosinophils Relative 1 %   Eosinophils Absolute 0.2 0.0 - 0.7 K/uL   Basophils Relative 0 %   Basophils Absolute 0.0 0.0 - 0.1 K/uL  Protime-INR     Status: None   Collection Time: 02/04/16 12:34 PM  Result Value Ref Range   Prothrombin Time 13.7 11.4 - 15.2 seconds   INR 1.05   APTT     Status: None   Collection Time: 02/04/16 12:34 PM  Result Value Ref Range   aPTT 31 24 - 36 seconds  Comprehensive metabolic panel     Status: Abnormal   Collection Time: 02/04/16 12:34 PM  Result Value Ref Range   Sodium 138 135 - 145 mmol/L   Potassium 3.4 (L) 3.5 - 5.1 mmol/L   Chloride 101 101 - 111  mmol/L   CO2 27 22 - 32 mmol/L   Glucose, Bld 104 (H) 65 - 99 mg/dL   BUN 16 6 - 20 mg/dL   Creatinine, Ser 1.47 (H) 0.44 - 1.00 mg/dL   Calcium 9.3 8.9 - 10.3 mg/dL   Total Protein 6.7 6.5 - 8.1 g/dL   Albumin 3.4 (L) 3.5 - 5.0 g/dL   AST 14 (L) 15 - 41 U/L   ALT 13 (L) 14 - 54 U/L   Alkaline Phosphatase 58 38 - 126 U/L   Total Bilirubin 0.8 0.3 - 1.2 mg/dL   GFR calc non Af Amer 33 (L) >60 mL/min   GFR calc Af Amer 39 (L) >60 mL/min   Anion gap 10 5 - 15  Troponin I     Status: None   Collection Time: 02/04/16 12:34 PM  Result Value Ref Range   Troponin I <0.03 <0.03 ng/mL  Lipid panel     Status: Abnormal   Collection Time: 02/04/16 12:34 PM  Result Value Ref Range   Cholesterol 207 (H) 0 - 200 mg/dL   Triglycerides 111 <150 mg/dL   HDL 47 >40 mg/dL   Total CHOL/HDL Ratio 4.4 RATIO   VLDL 22 0 - 40 mg/dL   LDL Cholesterol 138 (H) 0 - 99 mg/dL  I-stat troponin, ED (not at [facility], [facility]     Status: None   Collection Time: 02/04/16 12:45 PM  Result Value Ref Range   Troponin i, poc 0.00 0.00 - 0.08 ng/mL   Comment 3            EKG: NSR, atrial bigimeny  IMAGING: Dg Chest 2 View  Result Date: 02/04/2016 CLINICAL DATA:  Abnormal electrocardiogram. Chest pain. Tobacco use. EXAM: CHEST  2 VIEW COMPARISON:  April 22, 2014 FINDINGS: There is bilateral apical scarring, stable. There is no evident edema or consolidation. Heart size and pulmonary vascularity are normal. There is atherosclerotic calcification in the aorta. No adenopathy. There is degenerative change in the thoracic spine. There is calcification in the left anterior descending coronary artery. No pneumothorax.  There is postoperative change in the lower cervical spine. IMPRESSION: Stable bilateral apical scarring. No edema or consolidation. Aortic atherosclerosis noted as well as coronary artery calcification. Electronically Signed   By: Lowella Grip III M.D.   On: 02/04/2016 13:51    IMPRESSION: Principal  Problem:   Chest pain with moderate risk of acute coronary syndrome Active Problems:   Smoking   COPD- 1ppday smoker   Hyperlipidemia   GERD (gastroesophageal reflux disease)   HTN (hypertension)   Acute kidney injury- SCr 1.47    RECOMMENDATION: Chest pain with some typical and atypical symptoms. Troponin negative so far reassuring. She has CAD (lateral view on CXR shows Ca++ CAD) and a murmur suspicious for AS. Check echo. Continue ASA and statin, try either PO or topical NTG if she has more chest pain.   MD to see- ? Myoview unless echo shows severe AS or significant LVD.   Time Spent Directly with Patient: 8 minutes  Kerin Ransom, [place] beeper 02/04/2016, 4:20 PM   Personally seen and examined. Agree with above.  75 year old female with COPD, smoker, hyperlipidemia, essential hypertension with acute kidney injury here with chest discomfort, somewhat atypical, systolic murmur right upper sternal border.   - Agree with echocardiogram to assess for possible aortic stenosis.  - Nuclear stress test to exclude ischemia is certainly a reasonable way to proceed unless significant left ventricular dysfunction is noted on echocardiogram.   - Troponin thus far negative. CP mostly right sided. May be worse with cough.   - EKG demonstrates atrial bigeminy with nonspecific ST-T wave changes. Personally viewed.   - Tobacco cessation discussed.   Candee Furbish, MD

## 2016-02-04 NOTE — ED Provider Notes (Signed)
[facility] DEPT Provider Note   CSN: [id number] Arrival date & time: 02/04/16  1216     History   Chief Complaint Chief Complaint  Patient presents with  . Chest Pain    HPI  Blood pressure (!) 116/52, pulse 70, temperature 98.1 F (36.7 C), temperature source Oral, resp. rate 24, SpO2 98 %.  Joann Barnes is a 75 y.o. female medical history significant for tobacco use, COPD, hypertension, hyperlipidemia and angina complaining of a left-sided pressure-like chest pain onset 4 days ago described as like a elephant sitting on my chest, radiating to left arm. Not alleviated with muscle relaxers, no associated diaphoresis, nausea, vomiting, cough, shortness of breath, dizziness. She states that the pain lasted for 3 days and was completely relieved with sublingual nitroglycerin. She woke up this morning and had a right sided chest pain which was milder, she saw her primary care physician who advised her to come to the ED for evaluation of EKG changes and chest pain suspicious for angina. Infrequent this time, she was given a full dose aspirin and route. She does not take a daily aspirin. She states that her sister took her blood pressure this morning and her systolic was 76.  HPI  Past Medical History:  Diagnosis Date  . COPD (chronic obstructive pulmonary disease) ([place])   . GERD (gastroesophageal reflux disease)   . Hematuria   . Hernia, hiatal   . HTN (hypertension)   . Hyperlipidemia   . Hypothyroid   . Migraine   . OA (osteoarthritis)   . OP (osteoporosis)   . Other and unspecified angina pectoris     Patient Active Problem List   Diagnosis Date Noted  . Acute kidney injury superimposed on chronic kidney disease ([place]) 02/04/2016  . Chest pain with moderate risk of acute coronary syndrome 02/04/2016  . Tobacco use 02/04/2016  . Hypokalemia 02/04/2016  . Murmur, cardiac-suspect this is AS 02/04/2016  . Hyperlipidemia   . GERD (gastroesophageal reflux disease)   . HTN  (hypertension)   . Hypothyroid   . COPD (chronic obstructive pulmonary disease) ([place]) 11/16/2010  . Dyspnea 10/22/2010  . Smoking 10/22/2010    Past Surgical History:  Procedure Laterality Date  . APPENDECTOMY    . CERVICAL DISC ARTHROPLASTY     x 2  . ESOPHAGOGASTRODUODENOSCOPY  07/19/00    OB History    No data available       Home Medications    Prior to Admission medications   Medication Sig Start Date End Date Taking? Authorizing Provider  albuterol (ACCUNEB) 1.25 MG/3ML nebulizer solution Take 1 ampule by nebulization 2 (two) times daily as needed for wheezing or shortness of breath.   Yes Historical Provider, MD  albuterol (PROVENTIL,VENTOLIN) 90 MCG/ACT inhaler Inhale 2 puffs into the lungs every 4 (four) hours as needed for wheezing or shortness of breath. 11/16/10  Yes Tammy S Parrett, NP  amitriptyline (ELAVIL) 10 MG tablet Take 10 mg by mouth At bedtime as needed for sleep.  12/08/10  Yes Historical Provider, MD  amLODipine (NORVASC) 5 MG tablet Take 5 mg by mouth daily.     Yes Historical Provider, MD  Budesonide (UCERIS) 9 MG TB24 Take 9 mg by mouth daily as needed (for bowels).   Yes Historical Provider, MD  diazepam (VALIUM) 5 MG tablet Take 5 mg by mouth 2 (two) times daily as needed for anxiety.   Yes Historical Provider, MD  diclofenac (CATAFLAM) 50 MG tablet Take 50 mg by mouth  daily.   Yes Historical Provider, MD  escitalopram (LEXAPRO) 10 MG tablet Take 10 mg by mouth daily.   Yes Historical Provider, MD  Fluticasone-Salmeterol (ADVAIR) 250-50 MCG/DOSE AEPB Inhale 1 puff into the lungs 2 (two) times daily.   Yes Historical Provider, MD  gabapentin (NEURONTIN) 300 MG capsule Take 300 mg by mouth daily.   Yes Historical Provider, MD  hydrochlorothiazide 25 MG tablet Take 25 mg by mouth daily.     Yes Historical Provider, MD  ibandronate (BONIVA) 3 MG/3ML SOLN injection Inject 3 mLs (3 mg total) into the vein every 3 (three) months. 11/16/10  Yes Tammy S Parrett, NP    ibuprofen (ADVIL,MOTRIN) 200 MG tablet Take 600 mg by mouth every 8 (eight) hours as needed for headache or moderate pain.    Yes Historical Provider, MD  levothyroxine (LEVOXYL) 25 MCG tablet Take 25 mcg by mouth daily.     Yes Historical Provider, MD  omeprazole (PRILOSEC) 20 MG capsule Take 1 capsule (20 mg total) by mouth daily before breakfast. 11/16/10  Yes Tammy S Parrett, NP  pravastatin (PRAVACHOL) 40 MG tablet Take 40 mg by mouth at bedtime.   Yes Historical Provider, MD  solifenacin (VESICARE) 5 MG tablet Take 5 mg by mouth at bedtime.   Yes Historical Provider, MD  tiotropium (SPIRIVA) 18 MCG inhalation capsule Place 18 mcg into inhaler and inhale daily.     Yes Historical Provider, MD  tiZANidine (ZANAFLEX) 4 MG tablet Take 4 mg by mouth every 8 (eight) hours as needed for muscle spasms.   Yes Historical Provider, MD  sertraline (ZOLOFT) 100 MG tablet Take 1 tablet by mouth Daily. 10/14/10   Historical Provider, MD    Family History Family History  Problem Relation Age of Onset  . Heart disease Mother   . Heart disease Father   . Heart disease Brother   . Cancer Sister   . Cancer Sister     Social History Social History  Substance Use Topics  . Smoking status: Current Every Day Smoker    Packs/day: 0.50    Years: 45.00    Types: Cigarettes  . Smokeless tobacco: Not on file     Comment: intolerance to chantix 2010 - got rash and got admited. Failed nicotine patch/gum - itching.  [place] tried zyban/wellbutrin  . Alcohol use No     Allergies   Bactrim [sulfamethoxazole-trimethoprim]; Zyban [bupropion hcl]; Codeine; Benadryl [diphenhydramine hcl]; Chantix [varenicline tartrate]; Nicotine; and Zolpidem tartrate   Review of Systems Review of Systems  10 systems reviewed and found to be negative, except as noted in the HPI.   Physical Exam Updated Vital Signs BP 123/67   Pulse 73   Temp 98.1 F (36.7 C) (Oral)   Resp 18   SpO2 97%   Physical Exam   Constitutional: She is oriented to person, place, and time. She appears well-developed and well-nourished. No distress.  HENT:  Head: Normocephalic and atraumatic.  Mouth/Throat: Oropharynx is clear and moist.  Eyes: Conjunctivae and EOM are normal. Pupils are equal, round, and reactive to light.  Neck: Normal range of motion. No JVD present. No tracheal deviation present.  Cardiovascular: Normal rate, regular rhythm and intact distal pulses.   Radial pulse equal bilaterally  Pulmonary/Chest: Effort normal and breath sounds normal. No stridor. No respiratory distress. She has no wheezes. She has no rales. She exhibits no tenderness.  Abdominal: Soft. She exhibits no distension and no mass. There is no tenderness. There is no rebound  and no guarding.  Musculoskeletal: Normal range of motion. She exhibits no edema or tenderness.  No calf asymmetry, superficial collaterals, palpable cords, edema, Homans sign negative bilaterally.    Neurological: She is alert and oriented to person, place, and time.  Skin: Skin is warm. She is not diaphoretic.  Psychiatric: She has a normal mood and affect.  Nursing note and vitals reviewed.    ED Treatments / Results  Labs (all labs ordered are listed, but only abnormal results are displayed) Labs Reviewed  CBC - Abnormal; Notable for the following:       Result Value   WBC 14.5 (*)    All other components within normal limits  DIFFERENTIAL - Abnormal; Notable for the following:    Neutro Abs 11.5 (*)    Monocytes Absolute 1.4 (*)    All other components within normal limits  COMPREHENSIVE METABOLIC PANEL - Abnormal; Notable for the following:    Potassium 3.4 (*)    Glucose, Bld 104 (*)    Creatinine, Ser 1.47 (*)    Albumin 3.4 (*)    AST 14 (*)    ALT 13 (*)    GFR calc non Af Amer 33 (*)    GFR calc Af Amer 39 (*)    All other components within normal limits  LIPID PANEL - Abnormal; Notable for the following:    Cholesterol 207 (*)     LDL Cholesterol 138 (*)    All other components within normal limits  PROTIME-INR  APTT  TROPONIN I  TROPONIN I  LIPID PANEL  CBC  TSH  I-STAT TROPOININ, ED    EKG  EKG Interpretation  Date/Time:  Wednesday February 04 2016 12:33:36 EDT Ventricular Rate:  81 PR Interval:    QRS Duration: 95 QT Interval:  419 QTC Calculation: 422 R Axis:   2 Text Interpretation:  Sinus rhythm Supraventricular bigeminy Borderline low voltage, extremity leads Abnormal R-wave progression, early transition Confirmed by Wilson Singer  MD, STEPHEN [phone]) on 02/04/2016 1:00:13 PM       Radiology Dg Chest 2 View  Result Date: 02/04/2016 CLINICAL DATA:  Abnormal electrocardiogram. Chest pain. Tobacco use. EXAM: CHEST  2 VIEW COMPARISON:  April 22, 2014 FINDINGS: There is bilateral apical scarring, stable. There is no evident edema or consolidation. Heart size and pulmonary vascularity are normal. There is atherosclerotic calcification in the aorta. No adenopathy. There is degenerative change in the thoracic spine. There is calcification in the left anterior descending coronary artery. No pneumothorax. There is postoperative change in the lower cervical spine. IMPRESSION: Stable bilateral apical scarring. No edema or consolidation. Aortic atherosclerosis noted as well as coronary artery calcification. Electronically Signed   By: Lowella Grip III M.D.   On: 02/04/2016 13:51    Procedures Procedures (including critical care time)  Medications Ordered in ED Medications  0.9 %  sodium chloride infusion (not administered)  albuterol (ACCUNEB) nebulizer solution 1.25 mg (not administered)  diazepam (VALIUM) tablet 5 mg (not administered)  diclofenac (CATAFLAM) tablet 50 mg (not administered)  mometasone-formoterol (DULERA) 200-5 MCG/ACT inhaler 2 puff (not administered)  gabapentin (NEURONTIN) capsule 300 mg (not administered)  pravastatin (PRAVACHOL) tablet 40 mg (not administered)  darifenacin (ENABLEX)  24 hr tablet 7.5 mg (not administered)  escitalopram (LEXAPRO) tablet 10 mg (not administered)  amitriptyline (ELAVIL) tablet 10 mg (not administered)  albuterol (PROVENTIL,VENTOLIN) inhaler 2 puff (not administered)  ibandronate (BONIVA) injection 3 mg (not administered)  pantoprazole (PROTONIX) EC tablet 40 mg (not administered)  amLODipine (NORVASC) tablet 5 mg (not administered)  levothyroxine (SYNTHROID, LEVOTHROID) tablet 25 mcg (not administered)  tiotropium (SPIRIVA) inhalation capsule 18 mcg (not administered)  acetaminophen (TYLENOL) tablet 650 mg (not administered)  ondansetron (ZOFRAN) injection 4 mg (not administered)  enoxaparin (LOVENOX) injection 40 mg (not administered)  morphine 2 MG/ML injection 2 mg (not administered)  gi cocktail (Maalox,Lidocaine,Donnatal) (not administered)  aspirin EC tablet 325 mg (not administered)  potassium chloride SA (K-DUR,KLOR-CON) CR tablet 40 mEq (not administered)  gi cocktail (Maalox,Lidocaine,Donnatal) (not administered)  ketorolac (TORADOL) tablet 10 mg (not administered)  0.9 %  sodium chloride infusion (not administered)     Initial Impression / Assessment and Plan / ED Course  I have reviewed the triage vital signs and the nursing notes.  Pertinent labs & imaging results that were available during my care of the patient were reviewed by me and considered in my medical decision making (see chart for details).  Clinical Course    Vitals:   02/04/16 1415 02/04/16 1545 02/04/16 1600 02/04/16 1615  BP: 127/66 120/84 132/59 123/67  Pulse: 70 70 (!) 58 73  Resp:      Temp:      TempSrc:      SpO2: 93% 96% 97% 97%    Medications  0.9 %  sodium chloride infusion (not administered)  albuterol (ACCUNEB) nebulizer solution 1.25 mg (not administered)  diazepam (VALIUM) tablet 5 mg (not administered)  diclofenac (CATAFLAM) tablet 50 mg (not administered)  mometasone-formoterol (DULERA) 200-5 MCG/ACT inhaler 2 puff (not  administered)  gabapentin (NEURONTIN) capsule 300 mg (not administered)  pravastatin (PRAVACHOL) tablet 40 mg (not administered)  darifenacin (ENABLEX) 24 hr tablet 7.5 mg (not administered)  escitalopram (LEXAPRO) tablet 10 mg (not administered)  amitriptyline (ELAVIL) tablet 10 mg (not administered)  albuterol (PROVENTIL,VENTOLIN) inhaler 2 puff (not administered)  ibandronate (BONIVA) injection 3 mg (not administered)  pantoprazole (PROTONIX) EC tablet 40 mg (not administered)  amLODipine (NORVASC) tablet 5 mg (not administered)  levothyroxine (SYNTHROID, LEVOTHROID) tablet 25 mcg (not administered)  tiotropium (SPIRIVA) inhalation capsule 18 mcg (not administered)  acetaminophen (TYLENOL) tablet 650 mg (not administered)  ondansetron (ZOFRAN) injection 4 mg (not administered)  enoxaparin (LOVENOX) injection 40 mg (not administered)  morphine 2 MG/ML injection 2 mg (not administered)  gi cocktail (Maalox,Lidocaine,Donnatal) (not administered)  aspirin EC tablet 325 mg (not administered)  potassium chloride SA (K-DUR,KLOR-CON) CR tablet 40 mEq (not administered)  gi cocktail (Maalox,Lidocaine,Donnatal) (not administered)  ketorolac (TORADOL) tablet 10 mg (not administered)  0.9 %  sodium chloride infusion (not administered)    Joann Barnes is 75 y.o. female presenting with Chest pain concerning for ACS, now fully resolved after nitroglycerin was taken yesterday. Significant cardiac risk factors. Patient has bigeminy with some ST depression in the inferior leads and no reciprocal changes. Troponin negative, chest x-ray negative. Patient will need admission for chest pain rule out. Discussed with NP black who accepts admission, discussed with Trish, cardiology will evaluate. Patient received full dose aspirin by EMS this a.m.  Final Clinical Impressions(s) / ED Diagnoses   Final diagnoses:  Chest pain, unspecified type    New Prescriptions New Prescriptions   No medications on file      Monico Blitz, PA-C 02/04/16 [place], PA-C 02/04/16 1639    Virgel Manifold, MD 02/09/16 [phone]

## 2016-02-04 NOTE — ED Notes (Signed)
MD at bedside. 

## 2016-02-04 NOTE — H&P (Signed)
History and Physical    Joann Barnes [id number] DOB: Mar 17, 1941 DOA: 02/04/2016  PCP: Merrilee Seashore, MD Patient coming from: home  Chief Complaint: chest pain  HPI: Joann Barnes is a very pleasant  75 y.o. female with medical history significant for tobacco use, hypertension, hyperlipidemia since emergency Department chief complaint three-day history of chest pain. Presentation concerning for angina.  Information is obtained from the patient. She reports aching up 3 days ago with left anterior chest pain. She describes the pain as "1000 alpha onset normal chest" radiated to the left shoulder worse with movement of the left arm. She denies any headache dizziness palpitations shortness of breath. She reports she does smoke so she stays somewhat short of breath all the time but no worse. She reports the pain has been constant for 3 days. She took a muscle relaxer and got "some relief". She went to her PCP today was given nitroglycerin with "good relief". She states that 2 days prior to the onset of this pain she spent a good portion of the day "raking leaves". She also reports 2 weeks ago she had 2 nights where she had emesis that she believes is related to "indigestion". She denies coffee ground emesis. She denies any diaphoresis abdominal pain nausea vomiting. She denies cough fever chills lower extremity edema or orthopnea. She denies dysuria hematuria frequency or urgency. She denies diarrhea constipation melena or bright red blood per rectum. She states she went to see her primary care provider who advised she come to the emergency department for evaluation of EKG changes   ED Course: In the emergency department she is given an aspirin and gentle IV fluids. She is afebrile hemodynamically stable and not hypoxic  Review of Systems: As per HPI otherwise 10 point review of systems negative.   Ambulatory Status: She ambulates independently no recent falls  Past Medical History:    Diagnosis Date  . COPD (chronic obstructive pulmonary disease) ([place])   . GERD (gastroesophageal reflux disease)   . Hematuria   . Hernia, hiatal   . HTN (hypertension)   . Hyperlipidemia   . Hypothyroid   . Migraine   . OA (osteoarthritis)   . OP (osteoporosis)   . Other and unspecified angina pectoris     Past Surgical History:  Procedure Laterality Date  . APPENDECTOMY    . CERVICAL DISC ARTHROPLASTY     x 2  . ESOPHAGOGASTRODUODENOSCOPY  07/19/00    Social History   Social History  . Marital status: Widowed    Spouse name: N/A  . Number of children: N/A  . Years of education: N/A   Occupational History  . retired Retired   Social History Main Topics  . Smoking status: Current Every Day Smoker    Packs/day: 0.50    Years: 45.00    Types: Cigarettes  . Smokeless tobacco: Not on file     Comment: intolerance to chantix 2010 - got rash and got admited. Failed nicotine patch/gum - itching.  [place] tried zyban/wellbutrin  . Alcohol use No  . Drug use: No  . Sexual activity: Not on file   Other Topics Concern  . Not on file   Social History Narrative  . No narrative on file    Allergies  Allergen Reactions  . Bactrim [Sulfamethoxazole-Trimethoprim] Shortness Of Breath  . Zyban [Bupropion Hcl] Shortness Of Breath and Nausea And Vomiting  . Codeine Itching and Nausea And Vomiting  . Benadryl [Diphenhydramine Hcl] Other (See Comments)  drowsiness  . Chantix [Varenicline Tartrate] Hives and Rash  . Nicotine Other (See Comments)    Rash and itch with patch locally  . Zolpidem Tartrate Other (See Comments)    Bad dreams    Family History  Problem Relation Age of Onset  . Heart disease Mother   . Heart disease Father   . Heart disease Brother   . Cancer Sister   . Cancer Sister     Prior to Admission medications   Medication Sig Start Date End Date Taking? Authorizing Provider  albuterol (ACCUNEB) 1.25 MG/3ML nebulizer solution Take 1 ampule by  nebulization 2 (two) times daily as needed for wheezing or shortness of breath.   Yes Historical Provider, MD  albuterol (PROVENTIL,VENTOLIN) 90 MCG/ACT inhaler Inhale 2 puffs into the lungs every 4 (four) hours as needed for wheezing or shortness of breath. 11/16/10  Yes Tammy S Parrett, NP  amitriptyline (ELAVIL) 10 MG tablet Take 10 mg by mouth At bedtime as needed for sleep.  12/08/10  Yes Historical Provider, MD  amLODipine (NORVASC) 5 MG tablet Take 5 mg by mouth daily.     Yes Historical Provider, MD  Budesonide (UCERIS) 9 MG TB24 Take 9 mg by mouth daily as needed (for bowels).   Yes Historical Provider, MD  diazepam (VALIUM) 5 MG tablet Take 5 mg by mouth 2 (two) times daily as needed for anxiety.   Yes Historical Provider, MD  diclofenac (CATAFLAM) 50 MG tablet Take 50 mg by mouth daily.   Yes Historical Provider, MD  escitalopram (LEXAPRO) 10 MG tablet Take 10 mg by mouth daily.   Yes Historical Provider, MD  Fluticasone-Salmeterol (ADVAIR) 250-50 MCG/DOSE AEPB Inhale 1 puff into the lungs 2 (two) times daily.   Yes Historical Provider, MD  gabapentin (NEURONTIN) 300 MG capsule Take 300 mg by mouth daily.   Yes Historical Provider, MD  hydrochlorothiazide 25 MG tablet Take 25 mg by mouth daily.     Yes Historical Provider, MD  ibandronate (BONIVA) 3 MG/3ML SOLN injection Inject 3 mLs (3 mg total) into the vein every 3 (three) months. 11/16/10  Yes Tammy S Parrett, NP  ibuprofen (ADVIL,MOTRIN) 200 MG tablet Take 600 mg by mouth every 8 (eight) hours as needed for headache or moderate pain.    Yes Historical Provider, MD  levothyroxine (LEVOXYL) 25 MCG tablet Take 25 mcg by mouth daily.     Yes Historical Provider, MD  omeprazole (PRILOSEC) 20 MG capsule Take 1 capsule (20 mg total) by mouth daily before breakfast. 11/16/10  Yes Tammy S Parrett, NP  pravastatin (PRAVACHOL) 40 MG tablet Take 40 mg by mouth at bedtime.   Yes Historical Provider, MD  solifenacin (VESICARE) 5 MG tablet Take 5 mg by  mouth at bedtime.   Yes Historical Provider, MD  tiotropium (SPIRIVA) 18 MCG inhalation capsule Place 18 mcg into inhaler and inhale daily.     Yes Historical Provider, MD  tiZANidine (ZANAFLEX) 4 MG tablet Take 4 mg by mouth every 8 (eight) hours as needed for muscle spasms.   Yes Historical Provider, MD  sertraline (ZOLOFT) 100 MG tablet Take 1 tablet by mouth Daily. 10/14/10   Historical Provider, MD    Physical Exam: Vitals:   02/04/16 1300 02/04/16 1315 02/04/16 1407 02/04/16 1415  BP: 139/82 132/66 136/57 127/66  Pulse: 69 77 74 70  Resp: 16 26 18    Temp:   98.1 F (36.7 C)   TempSrc:   Oral   SpO2: 99% 94%  97% 93%     General:  Appears calm and comfortable, sitting up in bed no acute distress Eyes:  PERRL, EOMI, normal lids, iris ENT:  grossly normal hearing, lips & tongue, mucous membranes of her mouth are moist and pink Neck:  no LAD, masses or thyromegaly Cardiovascular:  regularly irregular positive murmur. No LE edema. Pedal pulses present and palpable. Left chest slightly tender to palpation Respiratory:  CTA bilaterally, no w/r/r. Normal respiratory effort. Abdomen:  soft, ntnd, positive bowel sounds throughout no guarding or rebounding Skin:  no rash or induration seen on limited exam Musculoskeletal:  grossly normal tone BUE/BLE, good ROM, no bony abnormality Psychiatric:  grossly normal mood and affect, speech fluent and appropriate, AOx3 Neurologic:  CN 2-12 grossly intact, moves all extremities in coordinated fashion, sensation intact  Labs on Admission: I have personally reviewed following labs and imaging studies  CBC:  Recent Labs Lab 02/04/16 1234  WBC 14.5*  NEUTROABS 11.5*  HGB 14.2  HCT 41.9  MCV 91.5  PLT [id number]   Basic Metabolic Panel:  Recent Labs Lab 02/04/16 1234  NA 138  K 3.4*  CL 101  CO2 27  GLUCOSE 104*  BUN 16  CREATININE 1.47*  CALCIUM 9.3   GFR: CrCl cannot be calculated (Unknown ideal weight.). Liver Function  Tests:  Recent Labs Lab 02/04/16 1234  AST 14*  ALT 13*  ALKPHOS 58  BILITOT 0.8  PROT 6.7  ALBUMIN 3.4*   No results for input(s): LIPASE, AMYLASE in the last 168 hours. No results for input(s): AMMONIA in the last 168 hours. Coagulation Profile:  Recent Labs Lab 02/04/16 1234  INR 1.05   Cardiac Enzymes:  Recent Labs Lab 02/04/16 1234  TROPONINI <0.03   BNP (last 3 results) No results for input(s): PROBNP in the last 8760 hours. HbA1C: No results for input(s): HGBA1C in the last 72 hours. CBG: No results for input(s): GLUCAP in the last 168 hours. Lipid Profile:  Recent Labs  02/04/16 1234  CHOL 207*  HDL 47  LDLCALC 138*  TRIG 111  CHOLHDL 4.4   Thyroid Function Tests: No results for input(s): TSH, T4TOTAL, FREET4, T3FREE, THYROIDAB in the last 72 hours. Anemia Panel: No results for input(s): VITAMINB12, FOLATE, FERRITIN, TIBC, IRON, RETICCTPCT in the last 72 hours. Urine analysis:    Component Value Date/Time   COLORURINE YELLOW 03/29/2007 [place] 03/29/2007 1449   LABSPEC 1.023 03/29/2007 1449   PHURINE 7.0 03/29/2007 1449   GLUCOSEU NEGATIVE 03/29/2007 1449   HGBUR NEGATIVE 03/29/2007 1449   BILIRUBINUR NEGATIVE 03/29/2007 1449   KETONESUR 15 (A) 03/29/2007 1449   PROTEINUR NEGATIVE 03/29/2007 1449   UROBILINOGEN 1.0 03/29/2007 1449   NITRITE NEGATIVE 03/29/2007 1449   LEUKOCYTESUR SMALL (A) 03/29/2007 1449    Creatinine Clearance: CrCl cannot be calculated (Unknown ideal weight.).  Sepsis Labs: @LABRCNTIP (procalcitonin:4,lacticidven:4) )No results found for this or any previous visit (from the past 240 hour(s)).   Radiological Exams on Admission: Dg Chest 2 View  Result Date: 02/04/2016 CLINICAL DATA:  Abnormal electrocardiogram. Chest pain. Tobacco use. EXAM: CHEST  2 VIEW COMPARISON:  April 22, 2014 FINDINGS: There is bilateral apical scarring, stable. There is no evident edema or consolidation. Heart size and  pulmonary vascularity are normal. There is atherosclerotic calcification in the aorta. No adenopathy. There is degenerative change in the thoracic spine. There is calcification in the left anterior descending coronary artery. No pneumothorax. There is postoperative change in the lower cervical spine. IMPRESSION:  Stable bilateral apical scarring. No edema or consolidation. Aortic atherosclerosis noted as well as coronary artery calcification. Electronically Signed   By: Lowella Grip III M.D.   On: 02/04/2016 13:51    EKG: Independently reviewed. Sinus rhythm Supraventricular bigeminy Borderline low voltage, extremity leads Abnormal R-wave progression, early transition  Assessment/Plan Principal Problem:   Chest pain at rest Active Problems:   Smoking   COPD (chronic obstructive pulmonary disease) ([facility])   Hyperlipidemia   GERD (gastroesophageal reflux disease)   HTN (hypertension)   Acute kidney injury superimposed on chronic kidney disease ([facility])   Hypokalemia   Hypothyroid   #1. Chest pain at rest. Etiology uncertain. Cardiac vs Yoe vs GI. Some typical and atypical features. Heart score is 5. Risk factors include hypertension and hyperlipidemia, long-term smoker family history. Could be delayed onset muscle soreness after raking leaves. Worsening GI symptoms last week. NTG and muscle relaxer helped pain.  Initial troponin negative. EKG is sinus rhythm supraventricular bigeminy. Chest x-ray with aortic atherosclerosis as well as coronary artery calcification no infiltrate. -Admit to telemetry -Cycle troponin -Serial EKG -Obtain a lipid panel -Continue daily aspirin -Continue  Statin -GI cocktail -NSAID -Supportive therapy -Cardiology consult. May benefit from stress test but could likely be done outpatient if appropriate  #2. Acute kidney injury superimposed on chronic kidney disease stage II. Creatinine 1.4 on admission. Chart review indicates creatinine 1.1 last year 1.25 several  years ago. -Gentle IV fluids -Hold nephrotoxins -Monitor urine output -Recheck in the morning  #3. Hypokalemia. Mild. Potassium 3.4 on admission. -Replete -Recheck  #4. Hypertension. Controlled in the emergency department. Home medication includes hydrochlorothiazide and amlodipine. -We'll hold hydrochlorothiazide for now -Monitor -anticipate resuming hydrochlorothiazide once #2 resolved  #5. Hypothyroidism. Home medications include levoxyl -Check a TSH -Continue home meds  #6. GERD. Stable at baseline -Continue home meds -GI cocktail as noted above  #7. smoker -Cessation counseling offered  #8. COPD. Not on home oxygen. Didn't smoking for decades. Appears stable at baseline. Oxygen saturation level greater than 90% on room air -Continue home meds -Monitor oxygen saturation level  DVT prophylaxis: lovenox  Code Status: full  Family Communication: sister at bedside  Disposition Plan: home when ready  Consults called: cardiology per ED MD  Admission status: obs    Radene Gunning MD [facility]  If 7PM-7AM, please contact night-coverage [email or web address] Password [email or web address]  02/04/2016, 3:40 PM

## 2016-02-04 NOTE — ED Notes (Signed)
Joann Barnes in lab notified of add on lipid panel

## 2016-02-04 NOTE — ED Triage Notes (Signed)
Per EMS - pt from PCP. C/o intermittent CP since Sunday. Pt took muscle relaxer w/o relief. Took nitro w/ some relief. Denies CP at this time. Sinus arrhythmia on 12-lead. Denies shortness of breath. 324mg  aspirin. Placed on 4L [place]. Hx HTN.

## 2016-02-05 ENCOUNTER — Observation Stay (HOSPITAL_BASED_OUTPATIENT_CLINIC_OR_DEPARTMENT_OTHER): Payer: Medicare Other

## 2016-02-05 ENCOUNTER — Encounter (HOSPITAL_COMMUNITY): Payer: Self-pay | Admitting: General Practice

## 2016-02-05 DIAGNOSIS — R0789 Other chest pain: Secondary | ICD-10-CM | POA: Diagnosis not present

## 2016-02-05 DIAGNOSIS — R079 Chest pain, unspecified: Secondary | ICD-10-CM

## 2016-02-05 DIAGNOSIS — J449 Chronic obstructive pulmonary disease, unspecified: Secondary | ICD-10-CM | POA: Diagnosis not present

## 2016-02-05 DIAGNOSIS — I129 Hypertensive chronic kidney disease with stage 1 through stage 4 chronic kidney disease, or unspecified chronic kidney disease: Secondary | ICD-10-CM | POA: Diagnosis not present

## 2016-02-05 DIAGNOSIS — E039 Hypothyroidism, unspecified: Secondary | ICD-10-CM | POA: Diagnosis not present

## 2016-02-05 LAB — ECHOCARDIOGRAM COMPLETE
AO mean calculated velocity dopler: 135 cm/s
AV Area VTI index: 0.56 cm2/m2
AV Area VTI: 0.93 cm2
AV Area mean vel: 0.97 cm2
AV Mean grad: 9 mmHg
AV Peak grad: 17 mmHg
AV VEL mean LVOT/AV: 0.55
AV area mean vel ind: 0.56 cm2/m2
AV peak Index: 0.54
AV pk vel: 208 cm/s
AV vel: 0.97
Ao pk vel: 0.52 m/s
E decel time: 218 msec
E/e' ratio: 11.69
FS: 27 % — AB (ref 28–44)
Height: 63 in
IVS/LV PW RATIO, ED: 0.93
LA ID, A-P, ES: 39 mm
LA diam end sys: 39 mm
LA diam index: 2.25 cm/m2
LA vol A4C: 51.8 ml
LA vol index: 29.9 mL/m2
LA vol: 51.8 mL
LV E/e' medial: 11.69
LV E/e'average: 11.69
LV PW d: 10.4 mm — AB (ref 0.6–1.1)
LV e' LATERAL: 8.05 cm/s
LVOT SV: 45 mL
LVOT VTI: 25.5 cm
LVOT area: 1.77 cm2
LVOT diameter: 15 mm
LVOT peak VTI: 0.55 cm
LVOT peak vel: 109 cm/s
Lateral S' vel: 16 cm/s
MV Dec: 218
MV Peak grad: 4 mmHg
MV pk A vel: 75.2 m/s
MV pk E vel: 94.1 m/s
RV sys press: 28 mmHg
Reg peak vel: 248 cm/s
TDI e' lateral: 8.05
TDI e' medial: 7.62
TR max vel: 248 cm/s
VTI: 46.3 cm
Valve area index: 0.56
Valve area: 0.97 cm2
Weight: 2459.2 oz

## 2016-02-05 LAB — BASIC METABOLIC PANEL
Anion gap: 12 (ref 5–15)
BUN: 15 mg/dL (ref 6–20)
CO2: 26 mmol/L (ref 22–32)
Calcium: 8.7 mg/dL — ABNORMAL LOW (ref 8.9–10.3)
Chloride: 101 mmol/L (ref 101–111)
Creatinine, Ser: 1.34 mg/dL — ABNORMAL HIGH (ref 0.44–1.00)
GFR calc Af Amer: 43 mL/min — ABNORMAL LOW (ref >60)
GFR calc non Af Amer: 37 mL/min — ABNORMAL LOW (ref >60)
Glucose, Bld: 99 mg/dL (ref 65–99)
Potassium: 3.7 mmol/L (ref 3.5–5.1)
Sodium: 139 mmol/L (ref 135–145)

## 2016-02-05 LAB — CBC
HCT: 36.4 % (ref 36.0–46.0)
Hemoglobin: 12.1 g/dL (ref 12.0–15.0)
MCH: 30.3 pg (ref 26.0–34.0)
MCHC: 33.2 g/dL (ref 30.0–36.0)
MCV: 91 fL (ref 78.0–100.0)
Platelets: 293 10*3/uL (ref 150–400)
RBC: 4 MIL/uL (ref 3.87–5.11)
RDW: 13.9 % (ref 11.5–15.5)
WBC: 12 10*3/uL — ABNORMAL HIGH (ref 4.0–10.5)

## 2016-02-05 MED ORDER — ASPIRIN EC 81 MG PO TBEC
81.0000 mg | DELAYED_RELEASE_TABLET | Freq: Every day | ORAL | Status: DC
  Administered 2016-02-06: 81 mg via ORAL
  Filled 2016-02-05: qty 1

## 2016-02-05 NOTE — Progress Notes (Signed)
  Echocardiogram 2D Echocardiogram has been performed.  Tresa Res 02/05/2016, 12:37 PM

## 2016-02-05 NOTE — Progress Notes (Addendum)
The client had chest pain this morning 7 out of 10. An EKG was taken, VS taken (please see flow sheet) morphine was given. The client is resting now pain free. I notified Schorr with [facility] to see if she wanted nitro SL ordered or more tropoins. Two negative yesterday in the ED. Awaiting a call back. I will continue to monitor the client closely.   Saddie Benders RN  Addendum:  Schorr placed orders to contact Cardiology with chest pain this morning. I notified Bhagat on call. Awaiting call back. Client still pain free at this time. Will continue to monitor the client closely.   Saddie Benders RN

## 2016-02-05 NOTE — Progress Notes (Signed)
Nuc study could not be done today will be done in AM.

## 2016-02-05 NOTE — Care Management Obs Status (Signed)
[place] [facility] NOTIFICATION   Patient Details  Name: Joann Barnes MRN: [id number] Date of Birth: [birth year]   Medicare Observation Status Notification Given:  Yes    Bethena Roys, RN 02/05/2016, 1:32 PM

## 2016-02-05 NOTE — Progress Notes (Addendum)
Patient Name: Joann Barnes Date of Encounter: 02/05/2016  Primary Cardiologist: [facility] Problem List     Principal Problem:   Chest pain with moderate risk of acute coronary syndrome Active Problems:   Smoking   COPD (chronic obstructive pulmonary disease) (H[facility])   Hyperlipidemia   GERD (gastroesophageal reflux disease)   HTN (hypertension)   Acute kidney injury superimposed on chronic kidney disease ([facility])   Hypokalemia   Hypothyroid   Murmur, cardiac-suspect this is AS   Chest pain     Subjective   Had an episode of CP earlier this AM. Right sided. Pain free now. Eager to get something done.   Inpatient Medications    Scheduled Meds: . amLODipine  5 mg Oral Daily  . aspirin EC  325 mg Oral Daily  . diclofenac  50 mg Oral Daily  . enoxaparin (LOVENOX) injection  40 mg Subcutaneous Q24H  . escitalopram  10 mg Oral Daily  . gabapentin  300 mg Oral Daily  . levothyroxine  25 mcg Oral QAC breakfast  . mometasone-formoterol  2 puff Inhalation BID  . pantoprazole  40 mg Oral Daily  . pravastatin  40 mg Oral QHS  . tiotropium  18 mcg Inhalation Daily   Continuous Infusions: . sodium chloride     PRN Meds:.acetaminophen, albuterol, amitriptyline, diazepam, gi cocktail, ketorolac, morphine injection, ondansetron (ZOFRAN) IV   Vital Signs    Vitals:   02/05/16 0526 02/05/16 0533 02/05/16 0934 02/05/16 0936  BP: (!) 131/46     Pulse: 72     Resp: 18     Temp: 99.2 F (37.3 C)     TempSrc: Oral     SpO2: 93%  94% 94%  Weight:  153 lb 11.2 oz (69.7 kg)    Height:        Intake/Output Summary (Last 24 hours) at 02/05/16 1016 Last data filed at 02/05/16 0800  Gross per 24 hour  Intake           396.25 ml  Output              350 ml  Net            46.25 ml   Filed Weights   02/04/16 1809 02/05/16 0533  Weight: 153 lb 4.8 oz (69.5 kg) 153 lb 11.2 oz (69.7 kg)    Physical Exam    GEN: Well nourished, well developed, in no acute distress.  HEENT:  Grossly normal.  Neck: Supple, no JVD, carotid bruits, or masses. Cardiac: RRR, 2/6 SEM, no rubs, or gallops. No clubbing, cyanosis, edema.  Radials/DP/PT 2+ and equal bilaterally.  Respiratory:  Respirations regular and unlabored, clear to auscultation bilaterally. GI: Soft, nontender, nondistended, BS + x 4. MS: no deformity or atrophy. Skin: warm and dry, no rash. Neuro:  Strength and sensation are intact. Psych: AAOx3.  Normal affect.  Labs    CBC  Recent Labs  02/04/16 1234 02/05/16 0512  WBC 14.5* 12.0*  NEUTROABS 11.5*  --   HGB 14.2 12.1  HCT 41.9 36.4  MCV 91.5 91.0  PLT 325 0000000   Basic Metabolic Panel  Recent Labs  02/04/16 1234 02/05/16 0512  NA 138 139  K 3.4* 3.7  CL 101 101  CO2 27 26  GLUCOSE 104* 99  BUN 16 15  CREATININE 1.47* 1.34*  CALCIUM 9.3 8.7*   Liver Function Tests  Recent Labs  02/04/16 1234  AST 14*  ALT 13*  ALKPHOS  58  BILITOT 0.8  PROT 6.7  ALBUMIN 3.4*   No results for input(s): LIPASE, AMYLASE in the last 72 hours. Cardiac Enzymes  Recent Labs  02/04/16 1234 02/04/16 1652  TROPONINI <0.03 <0.03   Recent Labs  02/04/16 1234  CHOL 207*  207*  HDL 48  47  LDLCALC 135*  138*  TRIG 118  111  CHOLHDL 4.3  4.4   Thyroid Function Tests  Recent Labs  02/04/16 1653  TSH 2.428    Telemetry    No adverse rhythms, occasional PVC.  - Personally Reviewed  ECG    No ST changes - Personally Reviewed  Radiology    Dg Chest 2 View  Result Date: 02/04/2016 CLINICAL DATA:  Abnormal electrocardiogram. Chest pain. Tobacco use. EXAM: CHEST  2 VIEW COMPARISON:  April 22, 2014 FINDINGS: There is bilateral apical scarring, stable. There is no evident edema or consolidation. Heart size and pulmonary vascularity are normal. There is atherosclerotic calcification in the aorta. No adenopathy. There is degenerative change in the thoracic spine. There is calcification in the left anterior descending coronary artery. No  pneumothorax. There is postoperative change in the lower cervical spine. IMPRESSION: Stable bilateral apical scarring. No edema or consolidation. Aortic atherosclerosis noted as well as coronary artery calcification. Electronically Signed   By: Lowella Grip III M.D.   On: 02/04/2016 13:51     Cardiac Studies   ECHO pending  Patient Profile     75 year old female with COPD, smoker, hyperlipidemia, essential hypertension with acute kidney injury here with chest discomfort, somewhat atypical, systolic murmur right upper sternal border.  Assessment & Plan     - Agree with echocardiogram to assess for possible aortic stenosis. Have asked nursing to call ECHO lab to see if we can get this earlier to help [place] make decision about further testing.   - Nuclear stress test to exclude ischemia is certainly a reasonable way to proceed unless significant left ventricular dysfunction is noted on echocardiogram.   - Troponin thus far negative. CP mostly right sided. May be worse with cough.   - EKG demonstrates atrial bigeminy with nonspecific ST-T wave changes. Personally viewed.   - Tobacco cessation discussed.    Signed, Candee Furbish, MD  02/05/2016, 10:16 AM   ADDEN: - Left ventricle: The cavity size was normal. Wall thickness was   normal. Systolic function was normal. The estimated ejection   fraction was in the range of 60% to 65%. Left ventricular   diastolic function parameters were normal. - Aortic valve: There was mild stenosis. Valve area (VTI): 0.97   cm^2. Valve area (Vmax): 0.93 cm^2. Valve area (Vmean): 0.97   cm^2. - Mitral valve: There was mild regurgitation. - Left atrium: The atrium was mildly dilated. - Atrial septum: No defect or patent foramen ovale was identified.  -order NUC stress  Candee Furbish, MD

## 2016-02-05 NOTE — Progress Notes (Signed)
PROGRESS NOTE    Joann Barnes  [id number] DOB: 01/05/1941 DOA: 02/04/2016 PCP: Merrilee Seashore, MD   Outpatient Specialists:     Brief Narrative:  Joann Barnes is a very pleasant  75 y.o. female with medical history significant for tobacco use, hypertension, hyperlipidemia since emergency Department chief complaint three-day history of chest pain. Presentation concerning for angina.  Information is obtained from the patient. She reports aching up 3 days ago with left anterior chest pain. She describes the pain as "1000 alpha onset normal chest" radiated to the left shoulder worse with movement of the left arm. She denies any headache dizziness palpitations shortness of breath. She reports she does smoke so she stays somewhat short of breath all the time but no worse. She reports the pain has been constant for 3 days. She took a muscle relaxer and got "some relief". She went to her PCP today was given nitroglycerin with "good relief". She states that 2 days prior to the onset of this pain she spent a good portion of the day "raking leaves". She also reports 2 weeks ago she had 2 nights where she had emesis that she believes is related to "indigestion". She denies coffee ground emesis. She denies any diaphoresis abdominal pain nausea vomiting. She denies cough fever chills lower extremity edema or orthopnea. She denies dysuria hematuria frequency or urgency. She denies diarrhea constipation melena or bright red blood per rectum. She states she went to see her primary care provider who advised she come to the emergency department for evaluation of EKG changes    Assessment & Plan:   Principal Problem:   Chest pain with moderate risk of acute coronary syndrome Active Problems:   Smoking   COPD (chronic obstructive pulmonary disease) (HCC)   Hyperlipidemia   GERD (gastroesophageal reflux disease)   HTN (hypertension)   Acute kidney injury superimposed on chronic kidney disease (HCC)  Hypokalemia   Hypothyroid   Murmur, cardiac-suspect this is AS   Chest pain    Chest pain at rest CE negative -for stress test in AM  Acute kidney injury superimposed on chronic kidney disease stage II. Creatinine 1.4 on admission. Chart review indicates creatinine 1.1 last year 1.25 several years ago. -BMP in AM  Hypokalemia. repleted and recheck in AM  Hypertension.  -stable   Hypothyroidism.  -resume home meds   GERD. Stable at baseline -Continue home meds -GI cocktail as noted above   smoker -Cessation counseling offered   COPD. Not on home oxygen.  -Oxygen saturation level greater than 90% on room air   DVT prophylaxis:  SCD's  Code Status: Full Code   Family Communication: patient  Disposition Plan:  Stress test in AM   Consultants:   cardiology      Subjective: No further chest pain  Objective: Vitals:   02/05/16 0526 02/05/16 0533 02/05/16 0934 02/05/16 0936  BP: (!) 131/46     Pulse: 72     Resp: 18     Temp: 99.2 F (37.3 C)     TempSrc: Oral     SpO2: 93%  94% 94%  Weight:  69.7 kg (153 lb 11.2 oz)    Height:        Intake/Output Summary (Last 24 hours) at 02/05/16 1352 Last data filed at 02/05/16 0800  Gross per 24 hour  Intake           396.25 ml  Output  350 ml  Net            46.25 ml   Filed Weights   02/04/16 1809 02/05/16 0533  Weight: 69.5 kg (153 lb 4.8 oz) 69.7 kg (153 lb 11.2 oz)    Examination:  General exam: Appears calm and comfortable  Respiratory system: Clear to auscultation. Respiratory effort normal. Cardiovascular system: S1 & S2 heard, RRR. No JVD, murmurs, rubs, gallops or clicks. No pedal edema. Gastrointestinal system: Abdomen is nondistended, soft and nontender. No organomegaly or masses felt. Normal bowel sounds heard.     Data Reviewed: I have personally reviewed following labs and imaging studies  CBC:  Recent Labs Lab 02/04/16 1234 02/05/16 0512  WBC 14.5*  12.0*  NEUTROABS 11.5*  --   HGB 14.2 12.1  HCT 41.9 36.4  MCV 91.5 91.0  PLT 325 0000000   Basic Metabolic Panel:  Recent Labs Lab 02/04/16 1234 02/05/16 0512  NA 138 139  K 3.4* 3.7  CL 101 101  CO2 27 26  GLUCOSE 104* 99  BUN 16 15  CREATININE 1.47* 1.34*  CALCIUM 9.3 8.7*   GFR: Estimated Creatinine Clearance: 33.4 mL/min (by C-G formula based on SCr of 1.34 mg/dL (H)). Liver Function Tests:  Recent Labs Lab 02/04/16 1234  AST 14*  ALT 13*  ALKPHOS 58  BILITOT 0.8  PROT 6.7  ALBUMIN 3.4*   No results for input(s): LIPASE, AMYLASE in the last 168 hours. No results for input(s): AMMONIA in the last 168 hours. Coagulation Profile:  Recent Labs Lab 02/04/16 1234  INR 1.05   Cardiac Enzymes:  Recent Labs Lab 02/04/16 1234 02/04/16 1652  TROPONINI <0.03 <0.03   BNP (last 3 results) No results for input(s): PROBNP in the last 8760 hours. HbA1C: No results for input(s): HGBA1C in the last 72 hours. CBG: No results for input(s): GLUCAP in the last 168 hours. Lipid Profile:  Recent Labs  02/04/16 1234  CHOL 207*  207*  HDL 48  47  LDLCALC 135*  138*  TRIG 118  111  CHOLHDL 4.3  4.4   Thyroid Function Tests:  Recent Labs  02/04/16 1653  TSH 2.428   Anemia Panel: No results for input(s): VITAMINB12, FOLATE, FERRITIN, TIBC, IRON, RETICCTPCT in the last 72 hours. Urine analysis:    Component Value Date/Time   COLORURINE YELLOW 03/29/2007 [place] 03/29/2007 1449   LABSPEC 1.023 03/29/2007 1449   PHURINE 7.0 03/29/2007 1449   GLUCOSEU NEGATIVE 03/29/2007 1449   HGBUR NEGATIVE 03/29/2007 1449   BILIRUBINUR NEGATIVE 03/29/2007 1449   KETONESUR 15 (A) 03/29/2007 1449   PROTEINUR NEGATIVE 03/29/2007 1449   UROBILINOGEN 1.0 03/29/2007 1449   NITRITE NEGATIVE 03/29/2007 1449   LEUKOCYTESUR SMALL (A) 03/29/2007 1449    )No results found for this or any previous visit (from the past 240 hour(s)).    Anti-infectives     None       Radiology Studies: Dg Chest 2 View  Result Date: 02/04/2016 CLINICAL DATA:  Abnormal electrocardiogram. Chest pain. Tobacco use. EXAM: CHEST  2 VIEW COMPARISON:  April 22, 2014 FINDINGS: There is bilateral apical scarring, stable. There is no evident edema or consolidation. Heart size and pulmonary vascularity are normal. There is atherosclerotic calcification in the aorta. No adenopathy. There is degenerative change in the thoracic spine. There is calcification in the left anterior descending coronary artery. No pneumothorax. There is postoperative change in the lower cervical spine. IMPRESSION: Stable bilateral apical scarring. No edema  or consolidation. Aortic atherosclerosis noted as well as coronary artery calcification. Electronically Signed   By: Lowella Grip III M.D.   On: 02/04/2016 13:51        Scheduled Meds: . amLODipine  5 mg Oral Daily  . [START ON 02/06/2016] aspirin EC  81 mg Oral Daily  . diclofenac  50 mg Oral Daily  . enoxaparin (LOVENOX) injection  40 mg Subcutaneous Q24H  . escitalopram  10 mg Oral Daily  . gabapentin  300 mg Oral Daily  . levothyroxine  25 mcg Oral QAC breakfast  . mometasone-formoterol  2 puff Inhalation BID  . pantoprazole  40 mg Oral Daily  . pravastatin  40 mg Oral QHS  . tiotropium  18 mcg Inhalation Daily   Continuous Infusions: . sodium chloride       LOS: 0 days    Time spent:25 min    Joann Barnes Alison Stalling, DO [facility] Pager [phone]  If 7PM-7AM, please contact night-coverage [email or web address] Password [email or web address] 02/05/2016, 1:52 PM

## 2016-02-06 ENCOUNTER — Observation Stay (HOSPITAL_BASED_OUTPATIENT_CLINIC_OR_DEPARTMENT_OTHER): Payer: Medicare Other

## 2016-02-06 ENCOUNTER — Observation Stay (HOSPITAL_COMMUNITY): Payer: Medicare Other

## 2016-02-06 DIAGNOSIS — J438 Other emphysema: Secondary | ICD-10-CM | POA: Diagnosis not present

## 2016-02-06 DIAGNOSIS — E7800 Pure hypercholesterolemia, unspecified: Secondary | ICD-10-CM | POA: Diagnosis not present

## 2016-02-06 DIAGNOSIS — E038 Other specified hypothyroidism: Secondary | ICD-10-CM

## 2016-02-06 DIAGNOSIS — N189 Chronic kidney disease, unspecified: Secondary | ICD-10-CM

## 2016-02-06 DIAGNOSIS — I10 Essential (primary) hypertension: Secondary | ICD-10-CM

## 2016-02-06 DIAGNOSIS — R0789 Other chest pain: Secondary | ICD-10-CM | POA: Diagnosis not present

## 2016-02-06 DIAGNOSIS — N179 Acute kidney failure, unspecified: Secondary | ICD-10-CM | POA: Diagnosis not present

## 2016-02-06 DIAGNOSIS — I350 Nonrheumatic aortic (valve) stenosis: Secondary | ICD-10-CM | POA: Diagnosis not present

## 2016-02-06 DIAGNOSIS — R079 Chest pain, unspecified: Secondary | ICD-10-CM | POA: Diagnosis not present

## 2016-02-06 LAB — CBC
HCT: 35.9 % — ABNORMAL LOW (ref 36.0–46.0)
Hemoglobin: 11.6 g/dL — ABNORMAL LOW (ref 12.0–15.0)
MCH: 29.6 pg (ref 26.0–34.0)
MCHC: 32.3 g/dL (ref 30.0–36.0)
MCV: 91.6 fL (ref 78.0–100.0)
Platelets: 301 10*3/uL (ref 150–400)
RBC: 3.92 MIL/uL (ref 3.87–5.11)
RDW: 14 % (ref 11.5–15.5)
WBC: 11 10*3/uL — ABNORMAL HIGH (ref 4.0–10.5)

## 2016-02-06 LAB — NM MYOCAR MULTI W/SPECT W/WALL MOTION / EF
Estimated workload: 1 METS
Exercise duration (min): 0 min
Exercise duration (sec): 0 s
LV dias vol: 49 mL (ref 46–106)
LV sys vol: 14 mL
MPHR: 144 {beats}/min
Peak HR: 83 {beats}/min
Percent HR: 57 %
RATE: 0
Rest HR: 67 {beats}/min
SDS: 2
SRS: 3
SSS: 5
TID: 1.07

## 2016-02-06 LAB — BASIC METABOLIC PANEL
Anion gap: 10 (ref 5–15)
BUN: 20 mg/dL (ref 6–20)
CO2: 27 mmol/L (ref 22–32)
Calcium: 8.7 mg/dL — ABNORMAL LOW (ref 8.9–10.3)
Chloride: 102 mmol/L (ref 101–111)
Creatinine, Ser: 1.42 mg/dL — ABNORMAL HIGH (ref 0.44–1.00)
GFR calc Af Amer: 40 mL/min — ABNORMAL LOW (ref >60)
GFR calc non Af Amer: 35 mL/min — ABNORMAL LOW (ref >60)
Glucose, Bld: 100 mg/dL — ABNORMAL HIGH (ref 65–99)
Potassium: 4 mmol/L (ref 3.5–5.1)
Sodium: 139 mmol/L (ref 135–145)

## 2016-02-06 MED ORDER — TECHNETIUM TC 99M TETROFOSMIN IV KIT
30.0000 | PACK | Freq: Once | INTRAVENOUS | Status: AC | PRN
  Administered 2016-02-06: 30 via INTRAVENOUS

## 2016-02-06 MED ORDER — ASPIRIN 81 MG PO TBEC: 81.0000 mg | DELAYED_RELEASE_TABLET | Freq: Every day | ORAL | Status: DC

## 2016-02-06 MED ORDER — REGADENOSON 0.4 MG/5ML IV SOLN
0.4000 mg | Freq: Once | INTRAVENOUS | Status: AC
Start: 2016-02-06 — End: 2016-02-06
  Administered 2016-02-06: 0.4 mg via INTRAVENOUS
  Filled 2016-02-06: qty 5

## 2016-02-06 MED ORDER — REGADENOSON 0.4 MG/5ML IV SOLN
INTRAVENOUS | Status: AC
  Filled 2016-02-06: qty 5

## 2016-02-06 MED ORDER — TECHNETIUM TC 99M TETROFOSMIN IV KIT
10.0000 | PACK | Freq: Once | INTRAVENOUS | Status: AC | PRN
  Administered 2016-02-06: 10 via INTRAVENOUS

## 2016-02-06 NOTE — Discharge Instructions (Signed)

## 2016-02-06 NOTE — Progress Notes (Addendum)
  1-day Nuclear Stress Test performed, images and final report pending. Being read by [facility]  Signed, Erma Heritage, PA-C 02/06/2016, 9:28 AM Pager: [phone]   Stress test showed:    There was no ST segment deviation noted during stress.  No T wave inversion was noted during stress.  Defect 1: There is a defect present in the basal inferior location.  This is a low risk study.  The left ventricular ejection fraction is hyperdynamic (>65%).  Normal perfusion and minimal soft tissue attenuation (diaphragm) NO ischemia or scar   Admitting team made aware. No further cardiac workup indicated at this time.   Signed, Erma Heritage, PA-C 02/06/2016, 3:02 PM Pager: [phone]

## 2016-02-06 NOTE — Discharge Summary (Signed)
Physician Discharge Summary  Joann Barnes [id number] DOB: [birth year] DOA: 02/04/2016  PCP: Merrilee Seashore, MD  Admit date: 02/04/2016 Discharge date: 02/06/2016   Recommendations for Outpatient Follow-Up:   1. Smoking cessation   Discharge Diagnosis:   Principal Problem:   Chest pain with moderate risk of acute coronary syndrome Active Problems:   Smoking   COPD (chronic obstructive pulmonary disease) (HCC)   Hyperlipidemia   GERD (gastroesophageal reflux disease)   HTN (hypertension)   Acute kidney injury superimposed on chronic kidney disease (HCC)   Hypokalemia   Hypothyroid   Murmur, cardiac-suspect this is AS   Chest pain   Discharge disposition:  Home.  Discharge Condition: Improved.  Diet recommendation: Low sodium, heart healthy.  Ca  Wound care: None.   History of Present Illness:   Joann Barnes is a very pleasant  75 y.o. female with medical history significant for tobacco use, hypertension, hyperlipidemia since emergency Department chief complaint three-day history of chest pain. Presentation concerning for angina.  Information is obtained from the patient. She reports aching up 3 days ago with left anterior chest pain. She describes the pain as "1000 alpha onset normal chest" radiated to the left shoulder worse with movement of the left arm. She denies any headache dizziness palpitations shortness of breath. She reports she does smoke so she stays somewhat short of breath all the time but no worse. She reports the pain has been constant for 3 days. She took a muscle relaxer and got "some relief". She went to her PCP today was given nitroglycerin with "good relief". She states that 2 days prior to the onset of this pain she spent a good portion of the day "raking leaves". She also reports 2 weeks ago she had 2 nights where she had emesis that she believes is related to "indigestion". She denies coffee ground emesis. She denies any diaphoresis abdominal  pain nausea vomiting. She denies cough fever chills lower extremity edema or orthopnea. She denies dysuria hematuria frequency or urgency. She denies diarrhea constipation melena or bright red blood per rectum. She states she went to see her primary care provider who advised she come to the emergency department for evaluation of EKG changes   Hospital Course by Problem:   Chest pain at rest CE negative -stress test low risk  Acute kidney injury superimposed on chronic kidney disease stage II. Creatinine 1.4 on admission.Chart review indicates creatinine 1.1 last year 1.25 several years ago. -follow up outpatient  Hypokalemia. repleted   Hypertension.  -stable   Hypothyroidism.  -resume home meds   GERD. Stable at baseline -Continue home meds -GI cocktail as noted above   smoker -Cessation counseling offered   COPD. Not on home oxygen.  -Oxygen saturation level greater than 90% on room air    Medical Consultants:    cardiology.   Discharge Exam:   Vitals:   02/06/16 0927 02/06/16 1333  BP: (!) 139/52 (!) 115/40  Pulse:  66  Resp:  17  Temp:  97.9 F (36.6 C)   Vitals:   02/06/16 0924 02/06/16 0926 02/06/16 0927 02/06/16 1333  BP: 136/70 133/60 (!) 139/52 (!) 115/40  Pulse:    66  Resp:    17  Temp:    97.9 F (36.6 C)  TempSrc:    Oral  SpO2:    94%  Weight:      Height:        Gen:  NAD   The results of significant diagnostics  from this hospitalization (including imaging, microbiology, ancillary and laboratory) are listed below for reference.     Procedures and Diagnostic Studies:   Dg Chest 2 View  Result Date: 02/04/2016 CLINICAL DATA:  Abnormal electrocardiogram. Chest pain. Tobacco use. EXAM: CHEST  2 VIEW COMPARISON:  April 22, 2014 FINDINGS: There is bilateral apical scarring, stable. There is no evident edema or consolidation. Heart size and pulmonary vascularity are normal. There is atherosclerotic calcification in the aorta.  No adenopathy. There is degenerative change in the thoracic spine. There is calcification in the left anterior descending coronary artery. No pneumothorax. There is postoperative change in the lower cervical spine. IMPRESSION: Stable bilateral apical scarring. No edema or consolidation. Aortic atherosclerosis noted as well as coronary artery calcification. Electronically Signed   By: Lowella Grip III M.D.   On: 02/04/2016 13:51     Labs:   Basic Metabolic Panel:  Recent Labs Lab 02/04/16 1234 02/05/16 0512 02/06/16 0409  NA 138 139 139  K 3.4* 3.7 4.0  CL 101 101 102  CO2 27 26 27   GLUCOSE 104* 99 100*  BUN 16 15 20   CREATININE 1.47* 1.34* 1.42*  CALCIUM 9.3 8.7* 8.7*   GFR Estimated Creatinine Clearance: 31.6 mL/min (by C-G formula based on SCr of 1.42 mg/dL (H)). Liver Function Tests:  Recent Labs Lab 02/04/16 1234  AST 14*  ALT 13*  ALKPHOS 58  BILITOT 0.8  PROT 6.7  ALBUMIN 3.4*   No results for input(s): LIPASE, AMYLASE in the last 168 hours. No results for input(s): AMMONIA in the last 168 hours. Coagulation profile  Recent Labs Lab 02/04/16 1234  INR 1.05    CBC:  Recent Labs Lab 02/04/16 1234 02/05/16 0512 02/06/16 0409  WBC 14.5* 12.0* 11.0*  NEUTROABS 11.5*  --   --   HGB 14.2 12.1 11.6*  HCT 41.9 36.4 35.9*  MCV 91.5 91.0 91.6  PLT 325 293 301   Cardiac Enzymes:  Recent Labs Lab 02/04/16 1234 02/04/16 1652  TROPONINI <0.03 <0.03   BNP: Invalid input(s): POCBNP CBG: No results for input(s): GLUCAP in the last 168 hours. D-Dimer No results for input(s): DDIMER in the last 72 hours. Hgb A1c No results for input(s): HGBA1C in the last 72 hours. Lipid Profile  Recent Labs  02/04/16 1234  CHOL 207*  207*  HDL 48  47  LDLCALC 135*  138*  TRIG 118  111  CHOLHDL 4.3  4.4   Thyroid function studies  Recent Labs  02/04/16 1653  TSH 2.428   Anemia work up No results for input(s): VITAMINB12, FOLATE, FERRITIN,  TIBC, IRON, RETICCTPCT in the last 72 hours. Microbiology No results found for this or any previous visit (from the past 240 hour(s)).   Discharge Instructions:   Discharge Instructions    Diet - low sodium heart healthy    Complete by:  As directed    Increase activity slowly    Complete by:  As directed        Medication List    STOP taking these medications   hydrochlorothiazide 25 MG tablet Commonly known as:  HYDRODIURIL     TAKE these medications   albuterol 1.25 MG/3ML nebulizer solution Commonly known as:  ACCUNEB Take 1 ampule by nebulization 2 (two) times daily as needed for wheezing or shortness of breath.   albuterol 90 MCG/ACT inhaler Commonly known as:  PROVENTIL,VENTOLIN Inhale 2 puffs into the lungs every 4 (four) hours as needed for wheezing or shortness of  breath.   amitriptyline 10 MG tablet Commonly known as:  ELAVIL Take 10 mg by mouth At bedtime as needed for sleep.   amLODipine 5 MG tablet Commonly known as:  NORVASC Take 5 mg by mouth daily.   aspirin 81 MG EC tablet Take 1 tablet (81 mg total) by mouth daily. Start taking on:  02/07/2016   diazepam 5 MG tablet Commonly known as:  VALIUM Take 5 mg by mouth 2 (two) times daily as needed for anxiety.   diclofenac 50 MG tablet Commonly known as:  CATAFLAM Take 50 mg by mouth daily.   escitalopram 10 MG tablet Commonly known as:  LEXAPRO Take 10 mg by mouth daily.   Fluticasone-Salmeterol 250-50 MCG/DOSE Aepb Commonly known as:  ADVAIR Inhale 1 puff into the lungs 2 (two) times daily.   gabapentin 300 MG capsule Commonly known as:  NEURONTIN Take 300 mg by mouth daily.   ibandronate 3 MG/3ML Soln injection Commonly known as:  BONIVA Inject 3 mLs (3 mg total) into the vein every 3 (three) months.   ibuprofen 200 MG tablet Commonly known as:  ADVIL,MOTRIN Take 600 mg by mouth every 8 (eight) hours as needed for headache or moderate pain.   LEVOXYL 25 MCG tablet Generic drug:   levothyroxine Take 25 mcg by mouth daily.   omeprazole 20 MG capsule Commonly known as:  PRILOSEC Take 1 capsule (20 mg total) by mouth daily before breakfast.   pravastatin 40 MG tablet Commonly known as:  PRAVACHOL Take 40 mg by mouth at bedtime.   sertraline 100 MG tablet Commonly known as:  ZOLOFT Take 1 tablet by mouth Daily.   solifenacin 5 MG tablet Commonly known as:  VESICARE Take 5 mg by mouth at bedtime.   tiotropium 18 MCG inhalation capsule Commonly known as:  SPIRIVA Place 18 mcg into inhaler and inhale daily.   tiZANidine 4 MG tablet Commonly known as:  ZANAFLEX Take 4 mg by mouth every 8 (eight) hours as needed for muscle spasms.   UCERIS 9 MG Tb24 Generic drug:  Budesonide Take 9 mg by mouth daily as needed (for bowels).      Follow-up Information    RAMACHANDRAN,AJITH, MD Follow up in 1 week(s).   Specialty:  Internal Medicine Contact information: [address] [place] [address] [phone]            Time coordinating discharge: 35 min  Signed:  Alissah Redmon Alison Stalling   [facility] 02/06/2016, 2:50 PM

## 2016-02-06 NOTE — Progress Notes (Signed)
Patient Name: Joann Barnes Date of Encounter: 02/06/2016  Primary Cardiologist: [facility] Problem List     Principal Problem:   Chest pain with moderate risk of acute coronary syndrome Active Problems:   Smoking   COPD (chronic obstructive pulmonary disease) (HCC)   Hyperlipidemia   GERD (gastroesophageal reflux disease)   HTN (hypertension)   Acute kidney injury superimposed on chronic kidney disease (HCC)   Hypokalemia   Hypothyroid   Murmur, cardiac-suspect this is AS   Chest pain     Subjective   Saw her in hallway going for NUC stress. No complaints.   Inpatient Medications    Scheduled Meds: . amLODipine  5 mg Oral Daily  . aspirin EC  81 mg Oral Daily  . diclofenac  50 mg Oral Daily  . enoxaparin (LOVENOX) injection  40 mg Subcutaneous Q24H  . escitalopram  10 mg Oral Daily  . gabapentin  300 mg Oral Daily  . levothyroxine  25 mcg Oral QAC breakfast  . mometasone-formoterol  2 puff Inhalation BID  . pantoprazole  40 mg Oral Daily  . pravastatin  40 mg Oral QHS  . tiotropium  18 mcg Inhalation Daily   Continuous Infusions: . sodium chloride     PRN Meds:.acetaminophen, albuterol, amitriptyline, diazepam, gi cocktail, ketorolac, morphine injection, ondansetron (ZOFRAN) IV   Vital Signs    Vitals:   02/05/16 0934 02/05/16 0936 02/05/16 1435 02/05/16 2038  BP:   (!) 102/55 (!) 142/49  Pulse:   78 72  Resp:   16 16  Temp:   97.8 F (36.6 C) 98 F (36.7 C)  TempSrc:   Axillary Axillary  SpO2: 94% 94% 95% 98%  Weight:      Height:        Intake/Output Summary (Last 24 hours) at 02/06/16 0746 Last data filed at 02/05/16 2000  Gross per 24 hour  Intake              480 ml  Output                0 ml  Net              480 ml   Filed Weights   02/04/16 1809 02/05/16 0533  Weight: 153 lb 4.8 oz (69.5 kg) 153 lb 11.2 oz (69.7 kg)    Physical Exam    GEN: Well nourished, well developed, in no acute distress.  HEENT: Grossly normal.    Neck: Supple, no JVD, carotid bruits, or masses. Cardiac: RRR, 2/6 SEM, no rubs, or gallops. No clubbing, cyanosis, edema.  Radials/DP/PT 2+ and equal bilaterally.  Respiratory:  Respirations regular and unlabored, clear to auscultation bilaterally. GI: Soft, nontender, nondistended, BS + x 4. MS: no deformity or atrophy. Skin: warm and dry, no rash. Neuro:  Strength and sensation are intact. Psych: AAOx3.  Normal affect.  Labs    CBC  Recent Labs  02/04/16 1234 02/05/16 0512 02/06/16 0409  WBC 14.5* 12.0* 11.0*  NEUTROABS 11.5*  --   --   HGB 14.2 12.1 11.6*  HCT 41.9 36.4 35.9*  MCV 91.5 91.0 91.6  PLT 325 293 [id number]   Basic Metabolic Panel  Recent Labs  02/05/16 0512 02/06/16 0409  NA 139 139  K 3.7 4.0  CL 101 102  CO2 26 27  GLUCOSE 99 100*  BUN 15 20  CREATININE 1.34* 1.42*  CALCIUM 8.7* 8.7*   Liver Function Tests  Recent Labs  02/04/16  1234  AST 14*  ALT 13*  ALKPHOS 58  BILITOT 0.8  PROT 6.7  ALBUMIN 3.4*   No results for input(s): LIPASE, AMYLASE in the last 72 hours. Cardiac Enzymes  Recent Labs  02/04/16 1234 02/04/16 1652  TROPONINI <0.03 <0.03    Recent Labs  02/04/16 1234  CHOL 207*  207*  HDL 48  47  LDLCALC 135*  138*  TRIG 118  111  CHOLHDL 4.3  4.4   Thyroid Function Tests  Recent Labs  02/04/16 1653  TSH 2.428    Telemetry    No adverse rhythms, occasional PVC.  - Personally Reviewed  ECG    No ST changes - Personally Reviewed  Radiology    Dg Chest 2 View  Result Date: 02/04/2016 CLINICAL DATA:  Abnormal electrocardiogram. Chest pain. Tobacco use. EXAM: CHEST  2 VIEW COMPARISON:  April 22, 2014 FINDINGS: There is bilateral apical scarring, stable. There is no evident edema or consolidation. Heart size and pulmonary vascularity are normal. There is atherosclerotic calcification in the aorta. No adenopathy. There is degenerative change in the thoracic spine. There is calcification in the left anterior  descending coronary artery. No pneumothorax. There is postoperative change in the lower cervical spine. IMPRESSION: Stable bilateral apical scarring. No edema or consolidation. Aortic atherosclerosis noted as well as coronary artery calcification. Electronically Signed   By: Lowella Grip III M.D.   On: 02/04/2016 13:51     Cardiac Studies   ECHO 02/05/16: - Left ventricle: The cavity size was normal. Wall thickness was   normal. Systolic function was normal. The estimated ejection   fraction was in the range of 60% to 65%. Left ventricular   diastolic function parameters were normal. - Aortic valve: There was mild stenosis. Valve area (VTI): 0.97   cm^2. Valve area (Vmax): 0.93 cm^2. Valve area (Vmean): 0.97   cm^2. - Mitral valve: There was mild regurgitation. - Left atrium: The atrium was mildly dilated. - Atrial septum: No defect or patent foramen ovale was identified.  Patient Profile     75 year old female with COPD, smoker, hyperlipidemia, essential hypertension with acute kidney injury here with chest discomfort, somewhat atypical, systolic murmur right upper sternal border.  Assessment & Plan     - Only mild aortic stenosis noted. Reassuring EF.  - Nuclear stress test to exclude ischemia  - Troponin negative. CP mostly right sided. May be worse with cough.   - EKG demonstrates atrial bigeminy with nonspecific ST-T wave changes. Personally viewed.   - Tobacco cessation discussed.   If NUC low risk, OK from cardiac perspective for DC. May follow up with PCP.   Signed, Candee Furbish, MD  02/06/2016, 7:46 AM

## 2016-04-15 ENCOUNTER — Encounter (HOSPITAL_COMMUNITY): Payer: Medicare Other

## 2016-04-16 ENCOUNTER — Other Ambulatory Visit (HOSPITAL_COMMUNITY): Payer: Self-pay | Admitting: *Deleted

## 2016-04-19 ENCOUNTER — Encounter (HOSPITAL_COMMUNITY)
Admission: RE | Admit: 2016-04-19 | Discharge: 2016-04-19 | Disposition: A | Discharge status: Home / Self Care | Payer: Medicare Other | Source: Ambulatory Visit | Attending: Internal Medicine | Admitting: Internal Medicine

## 2016-04-19 DIAGNOSIS — M810 Age-related osteoporosis without current pathological fracture: Secondary | ICD-10-CM | POA: Insufficient documentation

## 2016-04-19 MED ORDER — IBANDRONATE SODIUM 3 MG/3ML IV SOLN
INTRAVENOUS | Status: AC
  Filled 2016-04-19: qty 3

## 2016-04-19 MED ORDER — IBANDRONATE SODIUM 3 MG/3ML IV SOLN
3.0000 mg | Freq: Once | INTRAVENOUS | Status: AC
  Administered 2016-04-19: 3 mg via INTRAVENOUS

## 2016-06-24 DIAGNOSIS — J449 Chronic obstructive pulmonary disease, unspecified: Secondary | ICD-10-CM | POA: Diagnosis not present

## 2016-06-24 DIAGNOSIS — E782 Mixed hyperlipidemia: Secondary | ICD-10-CM | POA: Diagnosis not present

## 2016-07-01 DIAGNOSIS — J449 Chronic obstructive pulmonary disease, unspecified: Secondary | ICD-10-CM | POA: Diagnosis not present

## 2016-07-01 DIAGNOSIS — G43109 Migraine with aura, not intractable, without status migrainosus: Secondary | ICD-10-CM | POA: Diagnosis not present

## 2016-07-01 DIAGNOSIS — E039 Hypothyroidism, unspecified: Secondary | ICD-10-CM | POA: Diagnosis not present

## 2016-07-01 DIAGNOSIS — Z0000 Encounter for general adult medical examination without abnormal findings: Secondary | ICD-10-CM | POA: Diagnosis not present

## 2016-07-01 DIAGNOSIS — E782 Mixed hyperlipidemia: Secondary | ICD-10-CM | POA: Diagnosis not present

## 2016-07-28 ENCOUNTER — Other Ambulatory Visit (HOSPITAL_COMMUNITY): Payer: Self-pay | Admitting: *Deleted

## 2016-07-29 ENCOUNTER — Ambulatory Visit (HOSPITAL_COMMUNITY)
Admission: RE | Admit: 2016-07-29 | Discharge: 2016-07-29 | Disposition: A | Discharge status: Home / Self Care | Payer: PPO | Source: Ambulatory Visit | Attending: Internal Medicine | Admitting: Internal Medicine

## 2016-07-29 DIAGNOSIS — M810 Age-related osteoporosis without current pathological fracture: Secondary | ICD-10-CM | POA: Insufficient documentation

## 2016-07-29 MED ORDER — IBANDRONATE SODIUM 3 MG/3ML IV SOLN
INTRAVENOUS | Status: AC
Start: 2016-07-29 — End: 2016-07-29
  Filled 2016-07-29: qty 3

## 2016-07-29 MED ORDER — IBANDRONATE SODIUM 3 MG/3ML IV SOLN
3.0000 mg | Freq: Once | INTRAVENOUS | Status: AC
  Administered 2016-07-29: 3 mg via INTRAVENOUS

## 2016-08-05 DIAGNOSIS — K52831 Collagenous colitis: Secondary | ICD-10-CM | POA: Diagnosis not present

## 2016-08-05 DIAGNOSIS — R194 Change in bowel habit: Secondary | ICD-10-CM | POA: Diagnosis not present

## 2016-08-05 DIAGNOSIS — K219 Gastro-esophageal reflux disease without esophagitis: Secondary | ICD-10-CM | POA: Diagnosis not present

## 2016-09-06 DIAGNOSIS — J441 Chronic obstructive pulmonary disease with (acute) exacerbation: Secondary | ICD-10-CM | POA: Diagnosis not present

## 2016-09-06 DIAGNOSIS — M25532 Pain in left wrist: Secondary | ICD-10-CM | POA: Diagnosis not present

## 2016-10-26 DIAGNOSIS — J449 Chronic obstructive pulmonary disease, unspecified: Secondary | ICD-10-CM | POA: Diagnosis not present

## 2016-10-26 DIAGNOSIS — R600 Localized edema: Secondary | ICD-10-CM | POA: Diagnosis not present

## 2016-11-08 ENCOUNTER — Other Ambulatory Visit: Payer: Self-pay

## 2016-11-08 NOTE — Patient Outreach (Signed)
    Unsuccessful attempt to contact patient via telephone on [phone]. Will make another attempt to contact patient in the next 14 days.

## 2016-11-08 NOTE — Patient Outreach (Signed)
    Successful telephone contact made with member for this HTA Screening call. Patient states she is able to afford her co-payments, has no issues with food or shelter. Patient states she is currently on prednisone for the 2nd time this year. Patient states her medications are through mail order which she reports is very affordable. Patient agrees to receive information on via mail on [facility] Programs for future use if necessary.

## 2016-11-25 DIAGNOSIS — H40013 Open angle with borderline findings, low risk, bilateral: Secondary | ICD-10-CM | POA: Diagnosis not present

## 2016-11-25 DIAGNOSIS — H01003 Unspecified blepharitis right eye, unspecified eyelid: Secondary | ICD-10-CM | POA: Diagnosis not present

## 2016-11-25 DIAGNOSIS — H04123 Dry eye syndrome of bilateral lacrimal glands: Secondary | ICD-10-CM | POA: Diagnosis not present

## 2016-11-25 DIAGNOSIS — H43813 Vitreous degeneration, bilateral: Secondary | ICD-10-CM | POA: Diagnosis not present

## 2016-12-21 DIAGNOSIS — Z803 Family history of malignant neoplasm of breast: Secondary | ICD-10-CM | POA: Diagnosis not present

## 2016-12-21 DIAGNOSIS — Z1231 Encounter for screening mammogram for malignant neoplasm of breast: Secondary | ICD-10-CM | POA: Diagnosis not present

## 2016-12-27 DIAGNOSIS — M545 Low back pain: Secondary | ICD-10-CM | POA: Diagnosis not present

## 2016-12-27 DIAGNOSIS — M25562 Pain in left knee: Secondary | ICD-10-CM | POA: Diagnosis not present

## 2016-12-30 DIAGNOSIS — M25562 Pain in left knee: Secondary | ICD-10-CM | POA: Diagnosis not present

## 2016-12-30 DIAGNOSIS — R609 Edema, unspecified: Secondary | ICD-10-CM | POA: Diagnosis not present

## 2016-12-30 DIAGNOSIS — M5431 Sciatica, right side: Secondary | ICD-10-CM | POA: Diagnosis not present

## 2017-01-06 DIAGNOSIS — E782 Mixed hyperlipidemia: Secondary | ICD-10-CM | POA: Diagnosis not present

## 2017-01-06 DIAGNOSIS — J449 Chronic obstructive pulmonary disease, unspecified: Secondary | ICD-10-CM | POA: Diagnosis not present

## 2017-01-12 ENCOUNTER — Ambulatory Visit (INDEPENDENT_AMBULATORY_CARE_PROVIDER_SITE_OTHER): Payer: PPO | Admitting: Surgery

## 2017-01-12 DIAGNOSIS — M25562 Pain in left knee: Secondary | ICD-10-CM

## 2017-01-12 MED ORDER — BUPIVACAINE HCL 0.25 % IJ SOLN
6.0000 mL | INTRAMUSCULAR | Status: AC | PRN
  Administered 2017-01-12: 6 mL via INTRA_ARTICULAR

## 2017-01-12 MED ORDER — LIDOCAINE HCL 1 % IJ SOLN
1.0000 mL | INTRAMUSCULAR | Status: AC | PRN
  Administered 2017-01-12: 1 mL

## 2017-01-12 MED ORDER — METHYLPREDNISOLONE ACETATE 40 MG/ML IJ SUSP
80.0000 mg | INTRAMUSCULAR | Status: AC | PRN
  Administered 2017-01-12: 80 mg

## 2017-01-12 NOTE — Progress Notes (Signed)
Office Visit Note   Patient: Joann Barnes           Date of Birth: [birth year]           MRN: [id number] Visit Date: 01/12/2017              Requested by: Merrilee Seashore, [address], [place] [address] PCP: Merrilee Seashore, MD   Assessment & Plan: Visit Diagnoses:  1. Acute pain of left knee   Left knee pain and mechanical symptoms. Question medial meniscal tear.  Plan: Attempts to give patient some relief of knee pain offered injection. After patient consent left knee was prepped and intra-articular Marcaine/Depo-Medrol injection was performed. Follow-up in 3 weeks for recheck. A couple of days before appointment if knee pain and mechanical symptoms continue she will call and let me know and I will schedule MRI to rule out meniscus tear and evaluate the extent of degenerative changes. She is also having ongoing issues with right sided sciatica and will keep appointment with Dr. Arnoldo Morale neurosurgeon as scheduled.  Follow-Up Instructions: Return in about 3 weeks (around 02/02/2017) for On Jemarion [place] schedule.   Orders:  No orders of the defined types were placed in this encounter.  No orders of the defined types were placed in this encounter.     Procedures: Large Joint Inj Date/Time: 01/12/2017 10:17 AM Performed by: Lanae Crumbly Authorized by: Lanae Crumbly   Consent Given by:  Patient Timeout: prior to procedure the correct patient, procedure, and site was verified   Location:  Knee Site:  L knee Prep: patient was prepped and draped in usual sterile fashion   Needle Size:  25 G Needle Length:  1.5 inches Approach:  Anterolateral Ultrasound Guidance: No   Fluoroscopic Guidance: No   Arthrogram: No   Medications:  1 mL lidocaine 1 %; 80 mg methylPREDNISolone acetate 40 MG/ML; 6 mL bupivacaine 0.25 % Aspiration Attempted: No       Clinical Data: No additional findings.   Subjective: Chief Complaint  Patient presents with  . Left  Knee - Pain    HPI Patient comes in today with complaints of left knee pain and catching. Symptoms more along the medial aspect. Some swelling. Pain started about 2 months ago and was doing fine before onset. No specific injury that she can recall. Increased Symptoms last couple weeks. At times feels like the knee was to give out. She was seen by the orthopedic urgent care at [facility] 12/27/2016 and had x-rays taken. At that time patient was also complaining of right sided sciatica and was given prednisone taper to treat both areas addressed. Did not have any improvement of her symptoms. States that Dr. Arnoldo Morale neurosurgeon has performed lumbar discectomy a few years ago and has an appointment scheduled to see him in a couple weeks. Review of Systems No current cardiac pulmonary GI GU issues.  Objective: Vital Signs: There were no vitals taken for this visit.  Physical Exam  Constitutional: She is oriented to person, place, and time. No distress.  HENT:  Head: Normocephalic and atraumatic.  Eyes: Pupils are equal, round, and reactive to light.  Neck: Normal range of motion.  Pulmonary/Chest: No respiratory distress.  Abdominal: She exhibits no distension.  Musculoskeletal:  Gait is somewhat antalgic. Negative logroll left hip. Left knee good range of motion. No much by way patellofemoral crepitus. She is exquisitely tender at the medial joint line and lesser lateral. Positive McMurray's test. Does  have some swelling without large effusion. Ligaments are stable. Calf nontender. Neurovascularly intact.   Neurological: She is alert and oriented to person, place, and time.  Skin: Skin is warm and dry.  Psychiatric: She has a normal mood and affect.    Ortho Exam  Specialty Comments:  No specialty comments available.  Imaging: No results found.   PMFS History: Patient Active Problem List   Diagnosis Date Noted  . Acute kidney injury superimposed on chronic kidney disease ([place])  02/04/2016  . Chest pain with moderate risk of acute coronary syndrome 02/04/2016  . Tobacco use 02/04/2016  . Hypokalemia 02/04/2016  . Murmur, cardiac-suspect this is AS 02/04/2016  . Hyperlipidemia   . GERD (gastroesophageal reflux disease)   . HTN (hypertension)   . Hypothyroid   . Chest pain   . COPD (chronic obstructive pulmonary disease) ([place]) 11/16/2010  . Dyspnea 10/22/2010  . Smoking 10/22/2010   Past Medical History:  Diagnosis Date  . COPD (chronic obstructive pulmonary disease) ([place])   . Dyspnea   . GERD (gastroesophageal reflux disease)   . Hematuria   . Hernia, hiatal   . HTN (hypertension)   . Hyperlipidemia   . Hypothyroid   . Migraine   . OA (osteoarthritis)   . OP (osteoporosis)   . Other and unspecified angina pectoris     Family History  Problem Relation Age of Onset  . Heart disease Mother   . Heart disease Father   . Heart disease Brother   . Cancer Sister   . Cancer Sister     Past Surgical History:  Procedure Laterality Date  . APPENDECTOMY    . CERVICAL DISC ARTHROPLASTY     x 2  . ESOPHAGOGASTRODUODENOSCOPY  07/19/00  . HAND SURGERY Right 2014   Social History   Occupational History  . retired Retired   Social History Main Topics  . Smoking status: Current Every Day Smoker    Packs/day: 0.50    Years: 45.00    Types: Cigarettes  . Smokeless tobacco: Never Used     Comment: intolerance to chantix 2010 - got rash and got admited. Failed nicotine patch/gum - itching.  [place] tried zyban/wellbutrin  . Alcohol use No  . Drug use: No  . Sexual activity: Not on file   X-rays I did review left knee films done at [facility]. Patient brought in a disc. X-ray showed some tricompartmental narrowing nothing bone-on-bone. Looks good for patient's age. No acute findings.

## 2017-01-13 DIAGNOSIS — H1132 Conjunctival hemorrhage, left eye: Secondary | ICD-10-CM | POA: Diagnosis not present

## 2017-01-13 DIAGNOSIS — I10 Essential (primary) hypertension: Secondary | ICD-10-CM | POA: Diagnosis not present

## 2017-01-13 DIAGNOSIS — J449 Chronic obstructive pulmonary disease, unspecified: Secondary | ICD-10-CM | POA: Diagnosis not present

## 2017-01-13 DIAGNOSIS — E782 Mixed hyperlipidemia: Secondary | ICD-10-CM | POA: Diagnosis not present

## 2017-01-13 DIAGNOSIS — K219 Gastro-esophageal reflux disease without esophagitis: Secondary | ICD-10-CM | POA: Diagnosis not present

## 2017-01-13 DIAGNOSIS — M150 Primary generalized (osteo)arthritis: Secondary | ICD-10-CM | POA: Diagnosis not present

## 2017-01-13 DIAGNOSIS — E039 Hypothyroidism, unspecified: Secondary | ICD-10-CM | POA: Diagnosis not present

## 2017-01-13 DIAGNOSIS — F329 Major depressive disorder, single episode, unspecified: Secondary | ICD-10-CM | POA: Diagnosis not present

## 2017-01-13 DIAGNOSIS — Z23 Encounter for immunization: Secondary | ICD-10-CM | POA: Diagnosis not present

## 2017-01-19 DIAGNOSIS — R3916 Straining to void: Secondary | ICD-10-CM | POA: Diagnosis not present

## 2017-01-19 DIAGNOSIS — R3912 Poor urinary stream: Secondary | ICD-10-CM | POA: Diagnosis not present

## 2017-01-19 DIAGNOSIS — N393 Stress incontinence (female) (male): Secondary | ICD-10-CM | POA: Diagnosis not present

## 2017-01-25 DIAGNOSIS — I10 Essential (primary) hypertension: Secondary | ICD-10-CM | POA: Diagnosis not present

## 2017-01-25 DIAGNOSIS — M5416 Radiculopathy, lumbar region: Secondary | ICD-10-CM | POA: Diagnosis not present

## 2017-01-25 DIAGNOSIS — Z6825 Body mass index (BMI) 25.0-25.9, adult: Secondary | ICD-10-CM | POA: Diagnosis not present

## 2017-01-25 DIAGNOSIS — M4316 Spondylolisthesis, lumbar region: Secondary | ICD-10-CM | POA: Diagnosis not present

## 2017-02-02 ENCOUNTER — Ambulatory Visit (INDEPENDENT_AMBULATORY_CARE_PROVIDER_SITE_OTHER): Payer: PPO | Admitting: Surgery

## 2017-02-03 DIAGNOSIS — M4316 Spondylolisthesis, lumbar region: Secondary | ICD-10-CM | POA: Diagnosis not present

## 2017-02-08 DIAGNOSIS — M4316 Spondylolisthesis, lumbar region: Secondary | ICD-10-CM | POA: Diagnosis not present

## 2017-02-08 DIAGNOSIS — M5126 Other intervertebral disc displacement, lumbar region: Secondary | ICD-10-CM | POA: Diagnosis not present

## 2017-02-11 DIAGNOSIS — I10 Essential (primary) hypertension: Secondary | ICD-10-CM | POA: Diagnosis not present

## 2017-02-11 DIAGNOSIS — M5416 Radiculopathy, lumbar region: Secondary | ICD-10-CM | POA: Diagnosis not present

## 2017-02-11 DIAGNOSIS — M4316 Spondylolisthesis, lumbar region: Secondary | ICD-10-CM | POA: Diagnosis not present

## 2017-02-11 DIAGNOSIS — M9983 Other biomechanical lesions of lumbar region: Secondary | ICD-10-CM | POA: Diagnosis not present

## 2017-02-18 ENCOUNTER — Other Ambulatory Visit (HOSPITAL_COMMUNITY): Payer: Self-pay

## 2017-02-21 ENCOUNTER — Ambulatory Visit (HOSPITAL_COMMUNITY)
Admission: RE | Admit: 2017-02-21 | Discharge: 2017-02-21 | Disposition: A | Discharge status: Home / Self Care | Payer: PPO | Source: Ambulatory Visit | Attending: Internal Medicine | Admitting: Internal Medicine

## 2017-02-21 DIAGNOSIS — M810 Age-related osteoporosis without current pathological fracture: Secondary | ICD-10-CM | POA: Insufficient documentation

## 2017-02-21 MED ORDER — IBANDRONATE SODIUM 3 MG/3ML IV SOLN
3.0000 mg | Freq: Once | INTRAVENOUS | Status: AC
  Administered 2017-02-21: 3 mg via INTRAVENOUS

## 2017-02-21 MED ORDER — IBANDRONATE SODIUM 3 MG/3ML IV SOLN
INTRAVENOUS | Status: AC
  Filled 2017-02-21: qty 3

## 2017-02-24 DIAGNOSIS — Z6825 Body mass index (BMI) 25.0-25.9, adult: Secondary | ICD-10-CM | POA: Diagnosis not present

## 2017-02-24 DIAGNOSIS — M9983 Other biomechanical lesions of lumbar region: Secondary | ICD-10-CM | POA: Diagnosis not present

## 2017-03-09 DIAGNOSIS — M9983 Other biomechanical lesions of lumbar region: Secondary | ICD-10-CM | POA: Diagnosis not present

## 2017-03-09 DIAGNOSIS — M5416 Radiculopathy, lumbar region: Secondary | ICD-10-CM | POA: Diagnosis not present

## 2017-04-05 DIAGNOSIS — M5416 Radiculopathy, lumbar region: Secondary | ICD-10-CM | POA: Diagnosis not present

## 2017-05-10 DIAGNOSIS — I10 Essential (primary) hypertension: Secondary | ICD-10-CM | POA: Diagnosis not present

## 2017-05-10 DIAGNOSIS — M4316 Spondylolisthesis, lumbar region: Secondary | ICD-10-CM | POA: Diagnosis not present

## 2017-05-10 DIAGNOSIS — M5416 Radiculopathy, lumbar region: Secondary | ICD-10-CM | POA: Diagnosis not present

## 2017-05-10 DIAGNOSIS — Z6825 Body mass index (BMI) 25.0-25.9, adult: Secondary | ICD-10-CM | POA: Diagnosis not present

## 2017-05-12 ENCOUNTER — Other Ambulatory Visit: Payer: Self-pay | Admitting: Neurosurgery

## 2017-05-19 NOTE — Pre-Procedure Instructions (Signed)
Nygeria Lager Fess  05/19/2017      [place], [place] [address] Phone: [phone] Fax: [phone]  [id number] - [place], [place] - [address] [place] [address] Phone: [phone] Fax: [phone]    Your procedure is scheduled on .Jan 21  Report to [facility] at 530 A.M.  Call this number if you have problems the morning of surgery:  [phone]   Remember:  Do not eat food or drink liquids after midnight.  Take these medicines the morning of surgery with A SIP OF WATER Albuterol inhaler If needed, Albuterol neb if needed, amlodipine (Norvasc), Diazepam (Valium), escitalopram (Lexapro), Advair inhaler, gabapentin (Neurontin), Levothyroxine (Levoxyl), Omeprazole (Prilosec), Tiotropium (Spiriva) inhaler   Stop taking aspirin as directed by your Dr.  Margretta Sidle your inhalers with you on the day of surgery.  Stop taking Celebrex, BC's, goodys', Herbal medications, Fish Oil, Ibuprofen, Advil, Motrin, Aleve    Do not wear jewelry, make-up or nail polish.  Do not wear lotions, powders, or perfumes, or deodorant.  Do not shave 48 hours prior to surgery.  Men may shave face and neck.  Do not bring valuables to the hospital.  [facility] is not responsible for any belongings or valuables.  Contacts, dentures or bridgework may not be worn into surgery.  Leave your suitcase in the car.  After surgery it may be brought to your room.  For patients admitted to the hospital, discharge time will be determined by your treatment team.  Patients discharged the day of surgery will not be allowed to drive home.   Special instructions:  [facility] - Preparing for Surgery  Before surgery, you can play an important role.  Because skin is not sterile, your skin needs to be as free of germs as possible.  You can reduce the number of germs on you skin by  washing with CHG (chlorahexidine gluconate) soap before surgery.  CHG is an antiseptic cleaner which kills germs and bonds with the skin to continue killing germs even after washing.  Please DO NOT use if you have an allergy to CHG or antibacterial soaps.  If your skin becomes reddened/irritated stop using the CHG and inform your nurse when you arrive at Short Stay.  Do not shave (including legs and underarms) for at least 48 hours prior to the first CHG shower.  You may shave your face.  Please follow these instructions carefully:   1.  Shower with CHG Soap the night before surgery and the                                morning of Surgery.  2.  If you choose to wash your hair, wash your hair first as usual with your       normal shampoo.  3.  After you shampoo, rinse your hair and body thoroughly to remove the                      Shampoo.  4.  Use CHG as you would any other liquid soap.  You can apply chg directly       to the skin and wash gently with scrungie or a clean washcloth.  5.  Apply the CHG Soap to your body ONLY FROM  THE NECK DOWN.        Do not use on open wounds or open sores.  Avoid contact with your eyes,       ears, mouth and genitals (private parts).  Wash genitals (private parts)       with your normal soap.  6.  Wash thoroughly, paying special attention to the area where your surgery        will be performed.  7.  Thoroughly rinse your body with warm water from the neck down.  8.  DO NOT shower/wash with your normal soap after using and rinsing off       the CHG Soap.  9.  Pat yourself dry with a clean towel.            10.  Wear clean pajamas.            11.  Place clean sheets on your bed the night of your first shower and do not        sleep with pets.  Day of Surgery  Do not apply any lotions/deoderants the morning of surgery.  Please wear clean clothes to the hospital/surgery center.    Please read over the following fact sheets that you were given. Pain Booklet,  Coughing and Deep Breathing, MRSA Information and Surgical Site Infection Prevention

## 2017-05-20 ENCOUNTER — Other Ambulatory Visit: Payer: Self-pay

## 2017-05-20 ENCOUNTER — Encounter (HOSPITAL_COMMUNITY)
Admission: RE | Admit: 2017-05-20 | Discharge: 2017-05-20 | Disposition: A | Discharge status: Home / Self Care | Payer: PPO | Source: Ambulatory Visit | Attending: Neurosurgery | Admitting: Neurosurgery

## 2017-05-20 ENCOUNTER — Encounter (HOSPITAL_COMMUNITY): Payer: Self-pay

## 2017-05-20 DIAGNOSIS — Z7951 Long term (current) use of inhaled steroids: Secondary | ICD-10-CM | POA: Diagnosis not present

## 2017-05-20 DIAGNOSIS — Z881 Allergy status to other antibiotic agents status: Secondary | ICD-10-CM | POA: Diagnosis not present

## 2017-05-20 DIAGNOSIS — M4807 Spinal stenosis, lumbosacral region: Secondary | ICD-10-CM | POA: Diagnosis not present

## 2017-05-20 DIAGNOSIS — Z79890 Hormone replacement therapy (postmenopausal): Secondary | ICD-10-CM | POA: Diagnosis not present

## 2017-05-20 DIAGNOSIS — M810 Age-related osteoporosis without current pathological fracture: Secondary | ICD-10-CM | POA: Diagnosis present

## 2017-05-20 DIAGNOSIS — M4316 Spondylolisthesis, lumbar region: Secondary | ICD-10-CM | POA: Diagnosis present

## 2017-05-20 DIAGNOSIS — Z885 Allergy status to narcotic agent status: Secondary | ICD-10-CM | POA: Diagnosis not present

## 2017-05-20 DIAGNOSIS — M5416 Radiculopathy, lumbar region: Secondary | ICD-10-CM | POA: Diagnosis present

## 2017-05-20 DIAGNOSIS — Z79899 Other long term (current) drug therapy: Secondary | ICD-10-CM | POA: Diagnosis not present

## 2017-05-20 DIAGNOSIS — J449 Chronic obstructive pulmonary disease, unspecified: Secondary | ICD-10-CM | POA: Diagnosis present

## 2017-05-20 DIAGNOSIS — Z7982 Long term (current) use of aspirin: Secondary | ICD-10-CM | POA: Diagnosis not present

## 2017-05-20 DIAGNOSIS — M48062 Spinal stenosis, lumbar region with neurogenic claudication: Secondary | ICD-10-CM | POA: Diagnosis present

## 2017-05-20 DIAGNOSIS — I129 Hypertensive chronic kidney disease with stage 1 through stage 4 chronic kidney disease, or unspecified chronic kidney disease: Secondary | ICD-10-CM | POA: Diagnosis present

## 2017-05-20 DIAGNOSIS — E785 Hyperlipidemia, unspecified: Secondary | ICD-10-CM | POA: Diagnosis present

## 2017-05-20 DIAGNOSIS — I350 Nonrheumatic aortic (valve) stenosis: Secondary | ICD-10-CM | POA: Diagnosis present

## 2017-05-20 DIAGNOSIS — Z8701 Personal history of pneumonia (recurrent): Secondary | ICD-10-CM | POA: Diagnosis not present

## 2017-05-20 DIAGNOSIS — Z8249 Family history of ischemic heart disease and other diseases of the circulatory system: Secondary | ICD-10-CM | POA: Diagnosis not present

## 2017-05-20 DIAGNOSIS — K219 Gastro-esophageal reflux disease without esophagitis: Secondary | ICD-10-CM | POA: Diagnosis present

## 2017-05-20 DIAGNOSIS — Z888 Allergy status to other drugs, medicaments and biological substances status: Secondary | ICD-10-CM | POA: Diagnosis not present

## 2017-05-20 DIAGNOSIS — F17210 Nicotine dependence, cigarettes, uncomplicated: Secondary | ICD-10-CM | POA: Diagnosis present

## 2017-05-20 DIAGNOSIS — M1990 Unspecified osteoarthritis, unspecified site: Secondary | ICD-10-CM | POA: Diagnosis present

## 2017-05-20 DIAGNOSIS — M4327 Fusion of spine, lumbosacral region: Secondary | ICD-10-CM | POA: Diagnosis not present

## 2017-05-20 DIAGNOSIS — N183 Chronic kidney disease, stage 3 (moderate): Secondary | ICD-10-CM | POA: Diagnosis present

## 2017-05-20 DIAGNOSIS — Z972 Presence of dental prosthetic device (complete) (partial): Secondary | ICD-10-CM | POA: Diagnosis not present

## 2017-05-20 DIAGNOSIS — E039 Hypothyroidism, unspecified: Secondary | ICD-10-CM | POA: Diagnosis present

## 2017-05-20 DIAGNOSIS — M545 Low back pain: Secondary | ICD-10-CM | POA: Diagnosis not present

## 2017-05-20 HISTORY — DX: Nonrheumatic aortic (valve) stenosis: I35.0

## 2017-05-20 HISTORY — DX: Pneumonia, unspecified organism: J18.9

## 2017-05-20 LAB — BASIC METABOLIC PANEL
Anion gap: 11 (ref 5–15)
BUN: 14 mg/dL (ref 6–20)
CO2: 23 mmol/L (ref 22–32)
Calcium: 9.3 mg/dL (ref 8.9–10.3)
Chloride: 105 mmol/L (ref 101–111)
Creatinine, Ser: 1.38 mg/dL — ABNORMAL HIGH (ref 0.44–1.00)
GFR calc Af Amer: 42 mL/min — ABNORMAL LOW (ref >60)
GFR calc non Af Amer: 36 mL/min — ABNORMAL LOW (ref >60)
Glucose, Bld: 109 mg/dL — ABNORMAL HIGH (ref 65–99)
Potassium: 3.6 mmol/L (ref 3.5–5.1)
Sodium: 139 mmol/L (ref 135–145)

## 2017-05-20 LAB — CBC
HCT: 37.9 % (ref 36.0–46.0)
Hemoglobin: 12.3 g/dL (ref 12.0–15.0)
MCH: 28.4 pg (ref 26.0–34.0)
MCHC: 32.5 g/dL (ref 30.0–36.0)
MCV: 87.5 fL (ref 78.0–100.0)
Platelets: 351 10*3/uL (ref 150–400)
RBC: 4.33 MIL/uL (ref 3.87–5.11)
RDW: 14.4 % (ref 11.5–15.5)
WBC: 10.6 10*3/uL — ABNORMAL HIGH (ref 4.0–10.5)

## 2017-05-20 LAB — TYPE AND SCREEN
ABO/RH(D): A POS
Antibody Screen: NEGATIVE

## 2017-05-20 LAB — SURGICAL PCR SCREEN
MRSA, PCR: NEGATIVE
Staphylococcus aureus: NEGATIVE

## 2017-05-20 LAB — ABO/RH: ABO/RH(D): A POS

## 2017-05-20 NOTE — Progress Notes (Addendum)
PCP is Dr. Merrilee Seashore Denies ever seeing a cardiologist. Denies chest pain, fever, or cough, States she last took aspirin about a week ago. Echo and stress test 2017 Denies ever having a card cath.

## 2017-05-20 NOTE — Progress Notes (Signed)
Anesthesia Chart Review: Patient is a 77 year old female scheduled for L4-5, L5-S1 PLIF on 05/23/16 (first case) by Dr. Newman Pies. PAT at 3:00 PM Friday 05/20/17.  History includes smoking, hyperlipidemia, hypertension, GERD, hiatal hernia, migraines, osteoporosis, hematuria, COPD, hypothyroidism, dyspnea, appendectomy, chest pain 02/2016 (negative stress test), mild AS by 02/2016 echo.  - PCP is listed as Merrilee Seashore.  - She was seen by cardiologist Dr. Candee Furbish on 02/06/16 during hospitalization for chest pain. Nuclear stress test was non-ischemic. Echo showed mild AS. By 02/06/16 note, he felt she could follow up with her PCP.  Meds include albuterol nebulizer, albuterol MDI, amitriptyline, amlodipine, aspirin 81 mg (last dose ~ 1 week ago; takes sporadically), Valium, Lexapro, Advair, Lasix, Neurontin, Norco, Boniva, levothyroxine, Prilosec, pravastatin, Spiriva.  BP (!) 142/55   Pulse 89   Temp 37.3 C (Oral)   Resp 18   Ht 5\' 6"  (1.676 m)   Wt 154 lb 5 oz (70 kg)   SpO2 96%   BMI 24.91 kg/m   EKG 05/20/17: NSR, LAD.  Nuclear stress test 02/06/16:   There was no ST segment deviation noted during stress.  No T wave inversion was noted during stress.  Defect 1: There is a defect present in the basal inferior location.  This is a low risk study.  The left ventricular ejection fraction is hyperdynamic (>65%).  Normal perfusion and minimal soft tissue attenuation (diaphragm) NO ischemia or scar  Echo 02/05/16: Study Conclusions - Left ventricle: The cavity size was normal. Wall thickness was   normal. Systolic function was normal. The estimated ejection   fraction was in the range of 60% to 65%. Left ventricular   diastolic function parameters were normal. - Aortic valve: There was mild stenosis. Valve area (VTI): 0.97   cm^2. Valve area (Vmax): 0.93 cm^2. Valve area (Vmean): 0.97   cm^2. Mean gradient (S): 9 mm Hg. Peak gradient (S): 17 mm Hg. - Mitral valve:  There was mild regurgitation. - Left atrium: The atrium was mildly dilated. - Atrial septum: No defect or patent foramen ovale was identified.  PFTs 11/09/10: Interpretation:  There is a minimal obstructive lung defect. The airway obstruction is confirmed by the decrease in flow rate at peak flow and flow at 50% and 75% of the flow volume curve. An obstructive lung defect is confirmed by an increased RV. There is a mild decrease in diffusing capacity. This is interpreted as an insignificant response to bronchodilator.   Preoperative labs noted. Glucose 109. Cr 1.38. (Cr in 02/2016 was ~ 1.4.) WBC 11.0. H/H 11.6/35.9. PLT 301.   Only mild AS by echo just over a year ago. Non-ischemic stress test in 2017. Denied chest pain. Based on currently available information, I anticipate that she can proceed as planned if no acute changes.  Anesthesiologist to evaluate on the day of surgery.  [facility] Short Stay Center/Anesthesiology Phone [phone] 05/20/2017 5:43 PM

## 2017-05-23 ENCOUNTER — Inpatient Hospital Stay (HOSPITAL_COMMUNITY): Payer: PPO | Admitting: Anesthesiology

## 2017-05-23 ENCOUNTER — Inpatient Hospital Stay (HOSPITAL_COMMUNITY)
Admission: RE | Disposition: A | Discharge status: Home / Self Care | Payer: Self-pay | Source: Ambulatory Visit | Attending: Neurosurgery

## 2017-05-23 ENCOUNTER — Other Ambulatory Visit: Payer: Self-pay

## 2017-05-23 ENCOUNTER — Inpatient Hospital Stay (HOSPITAL_COMMUNITY): Payer: PPO

## 2017-05-23 ENCOUNTER — Inpatient Hospital Stay (HOSPITAL_COMMUNITY)
Admission: RE | Admit: 2017-05-23 | Discharge: 2017-05-24 | DRG: 455 | Disposition: A | Discharge status: Home / Self Care | Payer: PPO | Source: Ambulatory Visit | Attending: Neurosurgery | Admitting: Neurosurgery

## 2017-05-23 ENCOUNTER — Inpatient Hospital Stay (HOSPITAL_COMMUNITY): Payer: PPO | Admitting: Vascular Surgery

## 2017-05-23 ENCOUNTER — Encounter (HOSPITAL_COMMUNITY): Payer: Self-pay

## 2017-05-23 DIAGNOSIS — Z7951 Long term (current) use of inhaled steroids: Secondary | ICD-10-CM | POA: Diagnosis not present

## 2017-05-23 DIAGNOSIS — E039 Hypothyroidism, unspecified: Secondary | ICD-10-CM | POA: Diagnosis present

## 2017-05-23 DIAGNOSIS — E785 Hyperlipidemia, unspecified: Secondary | ICD-10-CM | POA: Diagnosis present

## 2017-05-23 DIAGNOSIS — J449 Chronic obstructive pulmonary disease, unspecified: Secondary | ICD-10-CM | POA: Diagnosis present

## 2017-05-23 DIAGNOSIS — I129 Hypertensive chronic kidney disease with stage 1 through stage 4 chronic kidney disease, or unspecified chronic kidney disease: Secondary | ICD-10-CM | POA: Diagnosis present

## 2017-05-23 DIAGNOSIS — Z972 Presence of dental prosthetic device (complete) (partial): Secondary | ICD-10-CM

## 2017-05-23 DIAGNOSIS — M810 Age-related osteoporosis without current pathological fracture: Secondary | ICD-10-CM | POA: Diagnosis present

## 2017-05-23 DIAGNOSIS — M4316 Spondylolisthesis, lumbar region: Secondary | ICD-10-CM | POA: Diagnosis present

## 2017-05-23 DIAGNOSIS — Z79890 Hormone replacement therapy (postmenopausal): Secondary | ICD-10-CM

## 2017-05-23 DIAGNOSIS — Z8701 Personal history of pneumonia (recurrent): Secondary | ICD-10-CM | POA: Diagnosis not present

## 2017-05-23 DIAGNOSIS — M5416 Radiculopathy, lumbar region: Secondary | ICD-10-CM | POA: Diagnosis present

## 2017-05-23 DIAGNOSIS — Z881 Allergy status to other antibiotic agents status: Secondary | ICD-10-CM

## 2017-05-23 DIAGNOSIS — Z7982 Long term (current) use of aspirin: Secondary | ICD-10-CM

## 2017-05-23 DIAGNOSIS — I350 Nonrheumatic aortic (valve) stenosis: Secondary | ICD-10-CM | POA: Diagnosis present

## 2017-05-23 DIAGNOSIS — Z79899 Other long term (current) drug therapy: Secondary | ICD-10-CM | POA: Diagnosis not present

## 2017-05-23 DIAGNOSIS — F17210 Nicotine dependence, cigarettes, uncomplicated: Secondary | ICD-10-CM | POA: Diagnosis present

## 2017-05-23 DIAGNOSIS — Z888 Allergy status to other drugs, medicaments and biological substances status: Secondary | ICD-10-CM

## 2017-05-23 DIAGNOSIS — Z8249 Family history of ischemic heart disease and other diseases of the circulatory system: Secondary | ICD-10-CM | POA: Diagnosis not present

## 2017-05-23 DIAGNOSIS — M48062 Spinal stenosis, lumbar region with neurogenic claudication: Secondary | ICD-10-CM | POA: Diagnosis present

## 2017-05-23 DIAGNOSIS — N183 Chronic kidney disease, stage 3 (moderate): Secondary | ICD-10-CM | POA: Diagnosis present

## 2017-05-23 DIAGNOSIS — K219 Gastro-esophageal reflux disease without esophagitis: Secondary | ICD-10-CM | POA: Diagnosis present

## 2017-05-23 DIAGNOSIS — Z885 Allergy status to narcotic agent status: Secondary | ICD-10-CM | POA: Diagnosis not present

## 2017-05-23 DIAGNOSIS — Z419 Encounter for procedure for purposes other than remedying health state, unspecified: Secondary | ICD-10-CM

## 2017-05-23 DIAGNOSIS — M1990 Unspecified osteoarthritis, unspecified site: Secondary | ICD-10-CM | POA: Diagnosis present

## 2017-05-23 DIAGNOSIS — M4327 Fusion of spine, lumbosacral region: Secondary | ICD-10-CM | POA: Diagnosis not present

## 2017-05-23 HISTORY — PX: LAMINECTOMY: SHX219

## 2017-05-23 HISTORY — DX: Spondylolisthesis, lumbar region: M43.16

## 2017-05-23 SURGERY — POSTERIOR LUMBAR FUSION 2 LEVEL
Anesthesia: General

## 2017-05-23 MED ORDER — ONDANSETRON HCL 4 MG PO TABS: 4.0000 mg | ORAL_TABLET | Freq: Four times a day (QID) | ORAL | Status: DC | PRN

## 2017-05-23 MED ORDER — FENTANYL CITRATE (PF) 250 MCG/5ML IJ SOLN
INTRAMUSCULAR | Status: AC
  Filled 2017-05-23: qty 5

## 2017-05-23 MED ORDER — ROCURONIUM BROMIDE 100 MG/10ML IV SOLN
INTRAVENOUS | Status: DC | PRN
  Administered 2017-05-23: 50 mg via INTRAVENOUS
  Administered 2017-05-23: 10 mg via INTRAVENOUS

## 2017-05-23 MED ORDER — ARTIFICIAL TEARS OPHTHALMIC OINT
TOPICAL_OINTMENT | OPHTHALMIC | Status: DC | PRN
  Administered 2017-05-23: 1 via OPHTHALMIC

## 2017-05-23 MED ORDER — LIDOCAINE HCL (CARDIAC) 20 MG/ML IV SOLN
INTRAVENOUS | Status: DC | PRN
  Administered 2017-05-23: 40 mg via INTRAVENOUS

## 2017-05-23 MED ORDER — BACITRACIN ZINC 500 UNIT/GM EX OINT
TOPICAL_OINTMENT | CUTANEOUS | Status: DC | PRN
  Administered 2017-05-23: 1 via TOPICAL

## 2017-05-23 MED ORDER — GABAPENTIN 300 MG PO CAPS
300.0000 mg | ORAL_CAPSULE | Freq: Every day | ORAL | Status: DC
  Administered 2017-05-24: 300 mg via ORAL
  Filled 2017-05-23: qty 1

## 2017-05-23 MED ORDER — ACETAMINOPHEN 650 MG RE SUPP: 650.0000 mg | RECTAL | Status: DC | PRN

## 2017-05-23 MED ORDER — ONDANSETRON HCL 4 MG/2ML IJ SOLN: 4.0000 mg | Freq: Four times a day (QID) | INTRAMUSCULAR | Status: DC | PRN

## 2017-05-23 MED ORDER — PROPOFOL 10 MG/ML IV BOLUS
INTRAVENOUS | Status: AC
  Filled 2017-05-23: qty 20

## 2017-05-23 MED ORDER — ALBUTEROL 90 MCG/ACT IN AERS: 2.0000 | INHALATION_SPRAY | RESPIRATORY_TRACT | Status: DC | PRN

## 2017-05-23 MED ORDER — BUPIVACAINE LIPOSOME 1.3 % IJ SUSP
INTRAMUSCULAR | Status: DC | PRN
  Administered 2017-05-23: 20 mL

## 2017-05-23 MED ORDER — DEXAMETHASONE SODIUM PHOSPHATE 10 MG/ML IJ SOLN
INTRAMUSCULAR | Status: DC | PRN
  Administered 2017-05-23: 10 mg via INTRAVENOUS

## 2017-05-23 MED ORDER — FUROSEMIDE 20 MG PO TABS: 20.0000 mg | ORAL_TABLET | Freq: Every day | ORAL | Status: DC | PRN

## 2017-05-23 MED ORDER — MORPHINE SULFATE (PF) 4 MG/ML IV SOLN: 4.0000 mg | INTRAVENOUS | Status: DC | PRN

## 2017-05-23 MED ORDER — OXYCODONE HCL 5 MG PO TABS: 10.0000 mg | ORAL_TABLET | ORAL | Status: DC | PRN

## 2017-05-23 MED ORDER — THROMBIN 20000 UNITS EX SOLR
CUTANEOUS | Status: AC
  Filled 2017-05-23: qty 20000

## 2017-05-23 MED ORDER — CELECOXIB 200 MG PO CAPS
200.0000 mg | ORAL_CAPSULE | Freq: Every day | ORAL | Status: DC
  Administered 2017-05-24: 200 mg via ORAL
  Filled 2017-05-23: qty 1

## 2017-05-23 MED ORDER — SODIUM CHLORIDE 0.9 % IR SOLN
Status: DC | PRN
  Administered 2017-05-23: 07:00:00

## 2017-05-23 MED ORDER — ONDANSETRON HCL 4 MG/2ML IJ SOLN
INTRAMUSCULAR | Status: DC | PRN
  Administered 2017-05-23: 4 mg via INTRAVENOUS

## 2017-05-23 MED ORDER — PROPOFOL 10 MG/ML IV BOLUS
INTRAVENOUS | Status: DC | PRN
  Administered 2017-05-23: 30 mg via INTRAVENOUS
  Administered 2017-05-23: 60 mg via INTRAVENOUS

## 2017-05-23 MED ORDER — ALBUTEROL SULFATE 1.25 MG/3ML IN NEBU: 1.0000 | INHALATION_SOLUTION | Freq: Two times a day (BID) | RESPIRATORY_TRACT | Status: DC | PRN

## 2017-05-23 MED ORDER — FENTANYL CITRATE (PF) 100 MCG/2ML IJ SOLN
INTRAMUSCULAR | Status: DC | PRN
  Administered 2017-05-23: 50 ug via INTRAVENOUS
  Administered 2017-05-23: 100 ug via INTRAVENOUS
  Administered 2017-05-23 (×2): 50 ug via INTRAVENOUS

## 2017-05-23 MED ORDER — DEXAMETHASONE SODIUM PHOSPHATE 10 MG/ML IJ SOLN
INTRAMUSCULAR | Status: AC
  Filled 2017-05-23: qty 1

## 2017-05-23 MED ORDER — ACETAMINOPHEN 325 MG PO TABS: 650.0000 mg | ORAL_TABLET | ORAL | Status: DC | PRN

## 2017-05-23 MED ORDER — LIDOCAINE 2% (20 MG/ML) 5 ML SYRINGE
INTRAMUSCULAR | Status: AC
  Filled 2017-05-23: qty 5

## 2017-05-23 MED ORDER — THROMBIN (RECOMBINANT) 5000 UNITS EX SOLR
CUTANEOUS | Status: DC | PRN
  Administered 2017-05-23: 07:00:00 via TOPICAL

## 2017-05-23 MED ORDER — LACTATED RINGERS IV SOLN
INTRAVENOUS | Status: DC | PRN
  Administered 2017-05-23 (×2): via INTRAVENOUS

## 2017-05-23 MED ORDER — SODIUM CHLORIDE 0.9% FLUSH
3.0000 mL | Freq: Two times a day (BID) | INTRAVENOUS | Status: DC
  Administered 2017-05-23: 3 mL via INTRAVENOUS

## 2017-05-23 MED ORDER — CEFAZOLIN SODIUM-DEXTROSE 2-4 GM/100ML-% IV SOLN
INTRAVENOUS | Status: AC
  Filled 2017-05-23: qty 100

## 2017-05-23 MED ORDER — SODIUM CHLORIDE 0.9% FLUSH: 3.0000 mL | INTRAVENOUS | Status: DC | PRN

## 2017-05-23 MED ORDER — PHENOL 1.4 % MT LIQD: 1.0000 | OROMUCOSAL | Status: DC | PRN

## 2017-05-23 MED ORDER — CHLORHEXIDINE GLUCONATE CLOTH 2 % EX PADS: 6.0000 | MEDICATED_PAD | Freq: Once | CUTANEOUS | Status: DC

## 2017-05-23 MED ORDER — ESCITALOPRAM OXALATE 10 MG PO TABS
10.0000 mg | ORAL_TABLET | Freq: Every day | ORAL | Status: DC
  Administered 2017-05-24: 10 mg via ORAL
  Filled 2017-05-23: qty 1

## 2017-05-23 MED ORDER — ALBUTEROL SULFATE (2.5 MG/3ML) 0.083% IN NEBU: 2.5000 mg | INHALATION_SOLUTION | RESPIRATORY_TRACT | Status: DC | PRN

## 2017-05-23 MED ORDER — DIAZEPAM 5 MG PO TABS
5.0000 mg | ORAL_TABLET | Freq: Two times a day (BID) | ORAL | Status: DC | PRN
  Administered 2017-05-24: 5 mg via ORAL
  Filled 2017-05-23: qty 1

## 2017-05-23 MED ORDER — SUGAMMADEX SODIUM 200 MG/2ML IV SOLN
INTRAVENOUS | Status: AC
  Filled 2017-05-23: qty 2

## 2017-05-23 MED ORDER — BUPIVACAINE-EPINEPHRINE (PF) 0.5% -1:200000 IJ SOLN
INTRAMUSCULAR | Status: DC | PRN
  Administered 2017-05-23: 10 mL via PERINEURAL

## 2017-05-23 MED ORDER — HYDROCODONE-ACETAMINOPHEN 5-325 MG PO TABS
1.0000 | ORAL_TABLET | ORAL | Status: DC | PRN
Start: 2017-05-23 — End: 2017-05-24
  Administered 2017-05-23 – 2017-05-24 (×5): 1 via ORAL
  Filled 2017-05-23 (×3): qty 1
  Filled 2017-05-23: qty 2
  Filled 2017-05-23 (×2): qty 1

## 2017-05-23 MED ORDER — DOCUSATE SODIUM 100 MG PO CAPS
100.0000 mg | ORAL_CAPSULE | Freq: Two times a day (BID) | ORAL | Status: DC
  Administered 2017-05-23 – 2017-05-24 (×3): 100 mg via ORAL
  Filled 2017-05-23 (×3): qty 1

## 2017-05-23 MED ORDER — THROMBIN (RECOMBINANT) 20000 UNITS EX SOLR
CUTANEOUS | Status: DC | PRN
  Administered 2017-05-23: 10:00:00 via TOPICAL

## 2017-05-23 MED ORDER — PHENYLEPHRINE HCL 10 MG/ML IJ SOLN
INTRAMUSCULAR | Status: DC | PRN
  Administered 2017-05-23: 80 ug via INTRAVENOUS
  Administered 2017-05-23: 40 ug via INTRAVENOUS

## 2017-05-23 MED ORDER — CEFAZOLIN SODIUM-DEXTROSE 2-4 GM/100ML-% IV SOLN
2.0000 g | Freq: Three times a day (TID) | INTRAVENOUS | Status: AC
  Administered 2017-05-23 – 2017-05-24 (×2): 2 g via INTRAVENOUS
  Filled 2017-05-23 (×2): qty 100

## 2017-05-23 MED ORDER — ROCURONIUM BROMIDE 10 MG/ML (PF) SYRINGE
PREFILLED_SYRINGE | INTRAVENOUS | Status: AC
  Filled 2017-05-23: qty 5

## 2017-05-23 MED ORDER — OXYCODONE HCL 5 MG PO TABS: 5.0000 mg | ORAL_TABLET | ORAL | Status: DC | PRN

## 2017-05-23 MED ORDER — ONDANSETRON HCL 4 MG/2ML IJ SOLN
INTRAMUSCULAR | Status: AC
  Filled 2017-05-23: qty 2

## 2017-05-23 MED ORDER — VANCOMYCIN HCL 1000 MG IV SOLR
INTRAVENOUS | Status: DC | PRN
  Administered 2017-05-23: 1000 mg

## 2017-05-23 MED ORDER — PHENYLEPHRINE 40 MCG/ML (10ML) SYRINGE FOR IV PUSH (FOR BLOOD PRESSURE SUPPORT)
PREFILLED_SYRINGE | INTRAVENOUS | Status: AC
  Filled 2017-05-23: qty 10

## 2017-05-23 MED ORDER — LEVOTHYROXINE SODIUM 25 MCG PO TABS
25.0000 ug | ORAL_TABLET | Freq: Every day | ORAL | Status: DC
  Administered 2017-05-24: 25 ug via ORAL
  Filled 2017-05-23: qty 1

## 2017-05-23 MED ORDER — SUGAMMADEX SODIUM 200 MG/2ML IV SOLN
INTRAVENOUS | Status: DC | PRN
  Administered 2017-05-23: 139.8 mg via INTRAVENOUS

## 2017-05-23 MED ORDER — VANCOMYCIN HCL 1000 MG IV SOLR
INTRAVENOUS | Status: AC
  Filled 2017-05-23: qty 1000

## 2017-05-23 MED ORDER — BISACODYL 10 MG RE SUPP
10.0000 mg | Freq: Every day | RECTAL | Status: DC | PRN
Start: 2017-05-23 — End: 2017-05-24

## 2017-05-23 MED ORDER — MENTHOL 3 MG MT LOZG: 1.0000 | LOZENGE | OROMUCOSAL | Status: DC | PRN

## 2017-05-23 MED ORDER — BACITRACIN ZINC 500 UNIT/GM EX OINT
TOPICAL_OINTMENT | CUTANEOUS | Status: AC
  Filled 2017-05-23: qty 28.35

## 2017-05-23 MED ORDER — PHENYLEPHRINE HCL 10 MG/ML IJ SOLN
INTRAVENOUS | Status: DC | PRN
  Administered 2017-05-23: 25 ug/min via INTRAVENOUS

## 2017-05-23 MED ORDER — BUPIVACAINE LIPOSOME 1.3 % IJ SUSP
20.0000 mL | Freq: Once | INTRAMUSCULAR | Status: DC
  Filled 2017-05-23: qty 20

## 2017-05-23 MED ORDER — ALBUMIN HUMAN 5 % IV SOLN
INTRAVENOUS | Status: DC | PRN
  Administered 2017-05-23 (×2): via INTRAVENOUS

## 2017-05-23 MED ORDER — THROMBIN 5000 UNITS EX SOLR
CUTANEOUS | Status: AC
  Filled 2017-05-23: qty 5000

## 2017-05-23 MED ORDER — PRAVASTATIN SODIUM 40 MG PO TABS
80.0000 mg | ORAL_TABLET | Freq: Every day | ORAL | Status: DC
  Administered 2017-05-23: 80 mg via ORAL
  Filled 2017-05-23: qty 2

## 2017-05-23 MED ORDER — BUPIVACAINE-EPINEPHRINE (PF) 0.5% -1:200000 IJ SOLN
INTRAMUSCULAR | Status: AC
  Filled 2017-05-23: qty 30

## 2017-05-23 MED ORDER — AMITRIPTYLINE HCL 10 MG PO TABS
10.0000 mg | ORAL_TABLET | Freq: Every evening | ORAL | Status: DC | PRN
  Filled 2017-05-23: qty 1

## 2017-05-23 MED ORDER — PANTOPRAZOLE SODIUM 40 MG PO TBEC
40.0000 mg | DELAYED_RELEASE_TABLET | Freq: Every day | ORAL | Status: DC
Start: 2017-05-24 — End: 2017-05-24
  Administered 2017-05-24: 40 mg via ORAL
  Filled 2017-05-23: qty 1

## 2017-05-23 MED ORDER — TIOTROPIUM BROMIDE MONOHYDRATE 18 MCG IN CAPS
18.0000 ug | ORAL_CAPSULE | Freq: Every day | RESPIRATORY_TRACT | Status: DC
  Filled 2017-05-23: qty 5

## 2017-05-23 MED ORDER — CYCLOBENZAPRINE HCL 10 MG PO TABS
10.0000 mg | ORAL_TABLET | Freq: Three times a day (TID) | ORAL | Status: DC | PRN
  Administered 2017-05-23: 10 mg via ORAL
  Filled 2017-05-23: qty 1

## 2017-05-23 MED ORDER — AMLODIPINE BESYLATE 5 MG PO TABS
5.0000 mg | ORAL_TABLET | Freq: Every day | ORAL | Status: DC
  Administered 2017-05-24: 5 mg via ORAL
  Filled 2017-05-23: qty 1

## 2017-05-23 MED ORDER — CEFAZOLIN SODIUM-DEXTROSE 2-4 GM/100ML-% IV SOLN
2.0000 g | INTRAVENOUS | Status: AC
  Administered 2017-05-23: 2 g via INTRAVENOUS

## 2017-05-23 MED ORDER — MOMETASONE FURO-FORMOTEROL FUM 200-5 MCG/ACT IN AERO
2.0000 | INHALATION_SPRAY | Freq: Two times a day (BID) | RESPIRATORY_TRACT | Status: DC
  Administered 2017-05-23 – 2017-05-24 (×2): 2 via RESPIRATORY_TRACT
  Filled 2017-05-23: qty 8.8

## 2017-05-23 MED ORDER — 0.9 % SODIUM CHLORIDE (POUR BTL) OPTIME
TOPICAL | Status: DC | PRN
  Administered 2017-05-23: 1000 mL

## 2017-05-23 SURGICAL SUPPLY — 77 items
APL SKNCLS STERI-STRIP NONHPOA (GAUZE/BANDAGES/DRESSINGS) ×1
BAG DECANTER FOR FLEXI CONT (MISCELLANEOUS) ×3 IMPLANT
BASKET BONE COLLECTION (BASKET) ×2 IMPLANT
BENZOIN TINCTURE PRP APPL 2/3 (GAUZE/BANDAGES/DRESSINGS) ×3 IMPLANT
BLADE CLIPPER SURG (BLADE) IMPLANT
BUR MATCHSTICK NEURO 3.0 LAGG (BURR) ×3 IMPLANT
BUR PRECISION FLUTE 6.0 (BURR) ×3 IMPLANT
CAGE ALTERA 10X31MM-10-14-15 (Cage) ×2 IMPLANT
CAGE ALTERA 10X31X10-14 15D (Cage) ×2 IMPLANT
CANISTER SUCT 3000ML PPV (MISCELLANEOUS) ×3 IMPLANT
CAP REVERE LOCKING (Cap) ×12 IMPLANT
CARTRIDGE OIL MAESTRO DRILL (MISCELLANEOUS) ×1 IMPLANT
CLOSURE WOUND 1/2 X4 (GAUZE/BANDAGES/DRESSINGS) ×1
CONT SPEC 4OZ CLIKSEAL STRL BL (MISCELLANEOUS) ×7 IMPLANT
COVER BACK TABLE 60X90IN (DRAPES) ×6 IMPLANT
DECANTER SPIKE VIAL GLASS SM (MISCELLANEOUS) ×3 IMPLANT
DIFFUSER DRILL AIR PNEUMATIC (MISCELLANEOUS) ×3 IMPLANT
DRAPE C-ARM 42X72 X-RAY (DRAPES) ×6 IMPLANT
DRAPE HALF SHEET 40X57 (DRAPES) ×3 IMPLANT
DRAPE LAPAROTOMY 100X72X124 (DRAPES) ×3 IMPLANT
DRAPE SURG 17X23 STRL (DRAPES) ×12 IMPLANT
ELECT BLADE 4.0 EZ CLEAN MEGAD (MISCELLANEOUS) ×3
ELECT REM PT RETURN 9FT ADLT (ELECTROSURGICAL) ×3
ELECTRODE BLDE 4.0 EZ CLN MEGD (MISCELLANEOUS) ×1 IMPLANT
ELECTRODE REM PT RTRN 9FT ADLT (ELECTROSURGICAL) ×1 IMPLANT
GAUZE SPONGE 4X4 12PLY STRL (GAUZE/BANDAGES/DRESSINGS) ×3 IMPLANT
GAUZE SPONGE 4X4 16PLY XRAY LF (GAUZE/BANDAGES/DRESSINGS) ×3 IMPLANT
GLOVE BIO SURGEON STRL SZ8 (GLOVE) ×6 IMPLANT
GLOVE BIO SURGEON STRL SZ8.5 (GLOVE) ×6 IMPLANT
GLOVE BIOGEL PI IND STRL 6.5 (GLOVE) IMPLANT
GLOVE BIOGEL PI IND STRL 7.0 (GLOVE) IMPLANT
GLOVE BIOGEL PI IND STRL 7.5 (GLOVE) IMPLANT
GLOVE BIOGEL PI INDICATOR 6.5 (GLOVE) ×8
GLOVE BIOGEL PI INDICATOR 7.0 (GLOVE) ×2
GLOVE BIOGEL PI INDICATOR 7.5 (GLOVE) ×2
GLOVE ECLIPSE 9.0 STRL (GLOVE) ×2 IMPLANT
GLOVE EXAM NITRILE LRG STRL (GLOVE) IMPLANT
GLOVE EXAM NITRILE XL STR (GLOVE) IMPLANT
GLOVE EXAM NITRILE XS STR PU (GLOVE) IMPLANT
GOWN STRL REUS W/ TWL LRG LVL3 (GOWN DISPOSABLE) IMPLANT
GOWN STRL REUS W/ TWL XL LVL3 (GOWN DISPOSABLE) ×2 IMPLANT
GOWN STRL REUS W/TWL 2XL LVL3 (GOWN DISPOSABLE) IMPLANT
GOWN STRL REUS W/TWL LRG LVL3 (GOWN DISPOSABLE) ×9
GOWN STRL REUS W/TWL XL LVL3 (GOWN DISPOSABLE) ×9
HEMOSTAT POWDER KIT SURGIFOAM (HEMOSTASIS) ×3 IMPLANT
KIT BASIN OR (CUSTOM PROCEDURE TRAY) ×3 IMPLANT
KIT ROOM TURNOVER OR (KITS) ×3 IMPLANT
MILL MEDIUM DISP (BLADE) ×3 IMPLANT
NDL HYPO 21X1.5 SAFETY (NEEDLE) IMPLANT
NEEDLE HYPO 21X1.5 SAFETY (NEEDLE) ×3 IMPLANT
NEEDLE HYPO 22GX1.5 SAFETY (NEEDLE) ×3 IMPLANT
NS IRRIG 1000ML POUR BTL (IV SOLUTION) ×3 IMPLANT
OIL CARTRIDGE MAESTRO DRILL (MISCELLANEOUS) ×3
PACK LAMINECTOMY NEURO (CUSTOM PROCEDURE TRAY) ×3 IMPLANT
PAD ARMBOARD 7.5X6 YLW CONV (MISCELLANEOUS) ×9 IMPLANT
PATTIES SURGICAL .5 X.5 (GAUZE/BANDAGES/DRESSINGS) ×2 IMPLANT
PATTIES SURGICAL .5 X1 (DISPOSABLE) IMPLANT
PATTIES SURGICAL 1X1 (DISPOSABLE) ×2 IMPLANT
ROD REVERE CURVED 65MM (Rod) ×4 IMPLANT
SCREW 7.5X45MM (Screw) ×4 IMPLANT
SCREW 7.5X50MM (Screw) ×4 IMPLANT
SCREW REVERE 6.35 7.5X40 (Screw) ×4 IMPLANT
SPONGE LAP 4X18 X RAY DECT (DISPOSABLE) IMPLANT
SPONGE NEURO XRAY DETECT 1X3 (DISPOSABLE) ×2 IMPLANT
SPONGE SURGIFOAM ABS GEL 100 (HEMOSTASIS) ×2 IMPLANT
STRIP BIOACTIVE 20CC 25X100X8 (Miscellaneous) ×4 IMPLANT
STRIP CLOSURE SKIN 1/2X4 (GAUZE/BANDAGES/DRESSINGS) ×2 IMPLANT
SUT PROLENE 6 0 BV (SUTURE) ×6 IMPLANT
SUT VIC AB 1 CT1 18XBRD ANBCTR (SUTURE) ×2 IMPLANT
SUT VIC AB 1 CT1 8-18 (SUTURE) ×6
SUT VIC AB 2-0 CP2 18 (SUTURE) ×6 IMPLANT
SYR 20CC LL (SYRINGE) IMPLANT
TAPE CLOTH SURG 4X10 WHT LF (GAUZE/BANDAGES/DRESSINGS) ×2 IMPLANT
TOWEL GREEN STERILE (TOWEL DISPOSABLE) ×3 IMPLANT
TOWEL GREEN STERILE FF (TOWEL DISPOSABLE) ×3 IMPLANT
TRAY FOLEY W/METER SILVER 16FR (SET/KITS/TRAYS/PACK) ×3 IMPLANT
WATER STERILE IRR 1000ML POUR (IV SOLUTION) ×3 IMPLANT

## 2017-05-23 NOTE — Anesthesia Preprocedure Evaluation (Addendum)
Anesthesia Evaluation  Patient identified by MRN, date of birth, ID band Patient awake    Reviewed: Allergy & Precautions, NPO status , Patient's Chart, lab work & pertinent test results  History of Anesthesia Complications Negative for: history of anesthetic complications  Airway Mallampati: III  TM Distance: >3 FB Neck ROM: limited    Dental  (+) Edentulous Upper, Edentulous Lower, Dental Advidsory Given, Upper Dentures, Lower Dentures   Pulmonary shortness of breath and with exertion, COPD,  COPD inhaler, Current Smoker,    breath sounds clear to auscultation       Cardiovascular hypertension, Pt. on medications (-) angina+ Valvular Problems/Murmurs AS  Rhythm:regular + Systolic murmurs    Neuro/Psych  Headaches,  Neuromuscular disease negative psych ROS   GI/Hepatic hiatal hernia, GERD  Controlled and Medicated,  Endo/Other  Hypothyroidism   Renal/GU CRFRenal disease     Musculoskeletal  (+) Arthritis ,   Abdominal   Peds  Hematology negative hematology ROS (+)   Anesthesia Other Findings History includes smoking, hyperlipidemia, hypertension, GERD, hiatal hernia, migraines, osteoporosis, hematuria, COPD, hypothyroidism, dyspnea, appendectomy, chest pain 02/2016 (negative stress test), mild AS by 02/2016 echo.  - PCP is listed as Merrilee Seashore.  - She was seen by cardiologist Dr. Candee Furbish on 02/06/16 during hospitalization for chest pain. Nuclear stress test was non-ischemic. Echo showed mild AS. By 02/06/16 note, he felt she could follow up with her PCP.  Meds include albuterol nebulizer, albuterol MDI, amitriptyline, amlodipine, aspirin 81 mg (last dose ~ 1 week ago; takes sporadically), Valium, Lexapro, Advair, Lasix, Neurontin, Norco, Boniva, levothyroxine, Prilosec, pravastatin, Spiriva.  BP (!) 142/55   Pulse 89   Temp 37.3 C (Oral)   Resp 18   Ht 5\' 6"  (1.676 m)   Wt 154 lb 5 oz (70 kg)    SpO2 96%   BMI 24.91 kg/m   EKG 05/20/17: NSR, LAD.  Nuclear stress test 02/06/16:   There was no ST segment deviation noted during stress.  No T wave inversion was noted during stress.  Defect 1: There is a defect present in the basal inferior location.  This is a low risk study.  The left ventricular ejection fraction is hyperdynamic (>65%).  Normal perfusion and minimal soft tissue attenuation (diaphragm) NO ischemia or scar  Echo 02/05/16: Study Conclusions - Left ventricle: The cavity size was normal. Wall thickness was normal. Systolic function was normal. The estimated ejection fraction was in the range of 60% to 65%. Left ventricular diastolic function parameters were normal. - Aortic valve: There was mild stenosis. Valve area (VTI): 0.97 cm^2. Valve area (Vmax): 0.93 cm^2. Valve area (Vmean): 0.97 cm^2. Mean gradient (S): 9 mm Hg. Peak gradient (S): 17 mm Hg. - Mitral valve: There was mild regurgitation. - Left atrium: The atrium was mildly dilated. - Atrial septum: No defect or patent foramen ovale was identified.  PFTs 11/09/10: Interpretation:  There is a minimal obstructive lung defect. The airway obstruction is confirmed by the decrease in flow rate at peak flow and flow at 50% and 75% of the flow volume curve. An obstructive lung defect is confirmed by an increased RV. There is a mild decrease in diffusing capacity. This is interpreted as an insignificant response to bronchodilator.      Reproductive/Obstetrics                           Anesthesia Physical Anesthesia Plan  ASA: III  Anesthesia Plan: General  Post-op Pain Management:    Induction: Intravenous  PONV Risk Score and Plan: 2 and Ondansetron and Dexamethasone  Airway Management Planned: Oral ETT  Additional Equipment: None  Intra-op Plan:   Post-operative Plan: Extubation in OR  Informed Consent: I have reviewed the patients History and Physical,  chart, labs and discussed the procedure including the risks, benefits and alternatives for the proposed anesthesia with the patient or authorized representative who has indicated his/her understanding and acceptance.   Dental Advisory Given  Plan Discussed with: Anesthesiologist, CRNA and Surgeon  Anesthesia Plan Comments:        Anesthesia Quick Evaluation

## 2017-05-23 NOTE — Op Note (Signed)
Brief history: The patient is a 77 year old white female who has complained of back, buttock and leg pain consistent with neurogenic claudication.  She has failed medical management and was worked up with a lumbar MRI and lumbar x-rays.  These studies demonstrated a lumbar spondylolisthesis with spinal stenosis.  I discussed the various treatment options with the patient including surgery.  She has weighed the risks, benefits, and alternatives of surgery and decided proceed with an L4-5 and L5-S1 decompression, instrumentation, and fusion.  Preoperative diagnosis: L4-5 spondylolisthesis, L4-5 and L5-S1 spinal stenosis foraminal stenosis compressing the L4, L5 and S1 nerve roots; lumbago; lumbar radiculopathy; neurogenic claudication  Postoperative diagnosis: The same  Procedure: L4-5 and L5-S1 laminectomy laminotomy/foraminotomies to decompress the bilateral L4, L5 and S1 nerve roots(the work required to do this was in addition to the work required to do the posterior lumbar interbody fusion because of the patient's spinal stenosis, facet arthropathy. Etc. requiring a wide decompression of the nerve roots.);  L4-5 and L5-S1 transforaminal lumbar interbody fusion with local morselized autograft bone and Kinnex graft extender; insertion of interbody prosthesis at L4-5 and L5-S1 (globus peek expandable interbody prosthesis); posterior segmental instrumentation from L4 to S1 with globus titanium pedicle screws and rods; posterior lateral arthrodesis at L4-5 and L5-S1 with local morselized autograft bone and Kinnex bone graft extender.  Surgeon: Dr. Earle Gell  Asst.: Dr. Annette Stable  Anesthesia: Gen. endotracheal  Estimated blood loss: 400 cc  Drains: None  Complications: None  Description of procedure: The patient was brought to the operating room by the anesthesia team. General endotracheal anesthesia was induced. The patient was turned to the prone position on the Wilson frame. The patient's  lumbosacral region was then prepared with Betadine scrub and Betadine solution. Sterile drapes were applied.  I then injected the area to be incised with Marcaine with epinephrine solution. I then used the scalpel to make a linear midline incision over the L4-5 and L5-S1 interspace. I then used electrocautery to perform a bilateral subperiosteal dissection exposing the spinous process and lamina of L4, L5 and the upper sacrum. We then obtained intraoperative radiograph to confirm our location. We then inserted the Verstrac retractor to provide exposure.  I used the Leksell rongeur to remove the L4 and L5 spinous process.  I began the decompression by using the high speed drill to perform laminotomies at L4-5 and L5-S1 bilaterally. We then used the Kerrison punches to complete the L5 laminectomy and to widen the at L4-5 and removed the ligamentum flavum at L5 and L5-S1. We used the Kerrison punches to remove the medial facets at L4-5 and L5-S1. We performed wide foraminotomies about the bilateral L4, L5 and S1 nerve roots completing the decompression.  We did encounter a durotomy and L4-5 on the left.  We repaired it with 6-0 Prolene suture.  We now turned our attention to the posterior lumbar interbody fusion. I used a scalpel to incise the intervertebral disc at L4-5 and L5-S1 bilaterally. I then performed a partial intervertebral discectomy at L4-5 and L5-S1 bilaterally using the pituitary forceps. We prepared the vertebral endplates at T6-1 and W4-R1 bilaterally for the fusion by removing the soft tissues with the curettes. We then used the trial spacers to pick the appropriate sized interbody prosthesis. We prefilled his prosthesis with a combination of local morselized autograft bone that we obtained during the decompression as well as Kinnex bone graft extender. We inserted the prefilled prosthesis into the interspace at L4-5 and L5-S1 from the right,  we then expanded the prosthesis. There was a good  snug fit of the prosthesis in the interspace. We then filled and the remainder of the intervertebral disc space with local morselized autograft bone and Kinnex. This completed the posterior lumbar interbody arthrodesis.  We now turned attention to the instrumentation. Under fluoroscopic guidance we cannulated the bilateral L4, L5 and S1 pedicles with the bone probe. We then removed the bone probe. We then tapped the pedicle with a Dix 0.5 millimeter tap. We then removed the tap. We probed inside the tapped pedicle with a ball probe to rule out cortical breaches. We then inserted a 7.5 x 40, 45 and 50 millimeter pedicle screw into the L4, L5 and S1 pedicles bilaterally under fluoroscopic guidance. We then palpated along the medial aspect of the pedicles to rule out cortical breaches. There were none. The nerve roots were not injured. We then connected the unilateral pedicle screws with a lordotic rod. We compressed the construct and secured the rod in place with the caps. We then tightened the caps appropriately. This completed the instrumentation from L4-S1 bilaterally.  We now turned our attention to the posterior lateral arthrodesis at L4-5 and L5-S1 bilaterally. We used the high-speed drill to decorticate the remainder of the facets, pars, transverse process at L4-5 and L5-S1 bilaterally. We then applied a combination of local morselized autograft bone and Kinnex bone graft extender over these decorticated posterior lateral structures. This completed the posterior lateral arthrodesis.  We then obtained hemostasis using bipolar electrocautery. We irrigated the wound out with bacitracin solution. We inspected the thecal sac and nerve roots and noted they were well decompressed. We then removed the retractor. We placed vancomycin powder in the wound. We reapproximated patient's thoracolumbar fascia with interrupted #1 Vicryl suture. We reapproximated patient's subcutaneous tissue with interrupted 2-0 Vicryl  suture. The reapproximated patient's skin with Steri-Strips and benzoin. The wound was then coated with bacitracin ointment. A sterile dressing was applied. The drapes were removed. The patient was subsequently returned to the supine position where they were extubated by the anesthesia team. He was then transported to the post anesthesia care unit in stable condition. All sponge instrument and needle counts were reportedly correct at the end of this case.

## 2017-05-23 NOTE — Anesthesia Procedure Notes (Signed)
Procedure Name: Intubation Date/Time: 05/23/2017 7:30 AM Performed by: Neldon Newport, CRNA Pre-anesthesia Checklist: Timeout performed, Patient being monitored, Suction available, Emergency Drugs available and Patient identified Patient Re-evaluated:Patient Re-evaluated prior to induction Oxygen Delivery Method: Circle system utilized Preoxygenation: Pre-oxygenation with 100% oxygen Induction Type: IV induction Ventilation: Mask ventilation without difficulty Laryngoscope Size: Mac and 3 Grade View: Grade I Tube type: Oral Tube size: 7.0 mm Number of attempts: 1 Placement Confirmation: breath sounds checked- equal and bilateral,  positive ETCO2 and ETT inserted through vocal cords under direct vision Secured at: 21 cm Tube secured with: Tape Dental Injury: Teeth and Oropharynx as per pre-operative assessment

## 2017-05-23 NOTE — Progress Notes (Signed)
Subjective: The patient is somnolent but easily arousable.  She is in no apparent distress.  She looks well.  Objective: Vital signs in last 24 hours: Temp:  [97.2 F (36.2 C)-98.2 F (36.8 C)] 97.2 F (36.2 C) (01/21 1245) Pulse Rate:  [73-79] 76 (01/21 1300) Resp:  [15-20] 15 (01/21 1300) BP: (127-159)/(53-57) 127/56 (01/21 1259) SpO2:  [100 %] 100 % (01/21 1300) Weight:  [69.9 kg (154 lb)] 69.9 kg (154 lb) (01/21 0629) Estimated body mass index is 24.86 kg/m as calculated from the following:   Height as of this encounter: 5\' 6"  (1.676 m).   Weight as of this encounter: 69.9 kg (154 lb).   Intake/Output from previous day: No intake/output data recorded. Intake/Output this shift: Total I/O In: 2600 [I.V.:2100; IV Piggyback:500] Out: 70 [Urine:110; Blood:300]  Physical exam the patient is somnolent but arousable.  She is moving her lower extremities well.  Lab Results: Recent Labs    05/20/17 1526  WBC 10.6*  HGB 12.3  HCT 37.9  PLT 351   BMET Recent Labs    05/20/17 1526  NA 139  K 3.6  CL 105  CO2 23  GLUCOSE 109*  BUN 14  CREATININE 1.38*  CALCIUM 9.3    Studies/Results: Dg Lumbar Spine 2-3 Views  Result Date: 05/23/2017 CLINICAL DATA:  L4-S1 fusion EXAM: LUMBAR SPINE - 2-3 VIEW; DG C-ARM 61-120 MIN COMPARISON:  None. FINDINGS: Intraoperative spot images demonstrate changes of posterior fusion from L4-S1. No visible hardware or bony complicating feature. IMPRESSION: L4-S1 posterior fusion.  No visible complicating feature. Electronically Signed   By: Rolm Baptise M.D.   On: 05/23/2017 12:00   Dg Lumbar Spine 2-3 Views  Result Date: 05/23/2017 CLINICAL DATA:  Lumbar disc protrusion. EXAM: LUMBAR SPINE - 2-3 VIEW COMPARISON:  MRI dated 02/08/2017 FINDINGS: Lateral portable image 1 demonstrates instruments at the L4-5 level. IMPRESSION: Instruments at L4-5. Electronically Signed   By: Lorriane Shire M.D.   On: 05/23/2017 08:57   Dg C-arm 1-60  Min  Result Date: 05/23/2017 CLINICAL DATA:  L4-S1 fusion EXAM: LUMBAR SPINE - 2-3 VIEW; DG C-ARM 61-120 MIN COMPARISON:  None. FINDINGS: Intraoperative spot images demonstrate changes of posterior fusion from L4-S1. No visible hardware or bony complicating feature. IMPRESSION: L4-S1 posterior fusion.  No visible complicating feature. Electronically Signed   By: Rolm Baptise M.D.   On: 05/23/2017 12:00   Dg C-arm 1-60 Min  Result Date: 05/23/2017 CLINICAL DATA:  L4-S1 fusion EXAM: LUMBAR SPINE - 2-3 VIEW; DG C-ARM 61-120 MIN COMPARISON:  None. FINDINGS: Intraoperative spot images demonstrate changes of posterior fusion from L4-S1. No visible hardware or bony complicating feature. IMPRESSION: L4-S1 posterior fusion.  No visible complicating feature. Electronically Signed   By: Rolm Baptise M.D.   On: 05/23/2017 12:00    Assessment/Plan: The patient is doing well.  I spoke with her family.  LOS: 0 days     Ophelia Charter 05/23/2017, 1:15 PM

## 2017-05-23 NOTE — H&P (Signed)
Subjective: The patient is a 77 year old white back and leg pain consistent with neurogenic claudication.  She has failed medical management and was worked up with a lumbar MRI and x-rays.  This demonstrated an L4-5 and L5-S1 spondylolisthesis, facet arthropathy, stenosis, etc.  I discussed the various treatment options with the patient including surgery.  She has weighed the risks, benefits, and alternatives of surgery and decided proceed with a L4-5 and L5-S1 decompression, instrumentation and fusion.  Past Medical History:  Diagnosis Date  . Aortic stenosis    Mild AS 02/2016 echo  . COPD (chronic obstructive pulmonary disease) ([place])   . Dyspnea   . GERD (gastroesophageal reflux disease)   . Hematuria   . Hernia, hiatal   . HTN (hypertension)   . Hyperlipidemia   . Hypothyroid   . Migraine   . OA (osteoarthritis)   . OP (osteoporosis)   . Other and unspecified angina pectoris    02/2016 - had non-ischemic stress test  . Pneumonia   . Spondylolisthesis of lumbar region     Past Surgical History:  Procedure Laterality Date  . APPENDECTOMY    . CERVICAL DISC ARTHROPLASTY     x 2  . COLONOSCOPY    . ESOPHAGOGASTRODUODENOSCOPY  07/19/00  . HAND SURGERY Right 2014    Allergies  Allergen Reactions  . Bactrim [Sulfamethoxazole-Trimethoprim] Shortness Of Breath  . Zyban [Bupropion Hcl] Shortness Of Breath and Nausea And Vomiting  . Codeine Itching and Nausea And Vomiting  . Benadryl [Diphenhydramine Hcl] Other (See Comments)    drowsiness  . Chantix [Varenicline Tartrate] Hives and Rash  . Chantix [Varenicline] Itching and Rash  . Nicotine Other (See Comments)    Rash and itch with patch locally  . Zolpidem Tartrate Other (See Comments)    Bad dreams    Social History   Tobacco Use  . Smoking status: Current Every Day Smoker    Packs/day: 1.00    Years: 45.00    Pack years: 45.00    Types: Cigarettes  . Smokeless tobacco: Never Used  . Tobacco comment: intolerance to  chantix 2010 - got rash and got admited. Failed nicotine patch/gum - itching.  [place] tried zyban/wellbutrin  Substance Use Topics  . Alcohol use: No    Family History  Problem Relation Age of Onset  . Heart disease Mother   . Heart disease Father   . Heart disease Brother   . Cancer Sister   . Cancer Sister    Prior to Admission medications   Medication Sig Start Date End Date Taking? Authorizing Provider  albuterol (ACCUNEB) 1.25 MG/3ML nebulizer solution Take 1 ampule by nebulization 2 (two) times daily as needed for wheezing or shortness of breath.   Yes [provider]  albuterol (PROVENTIL,VENTOLIN) 90 MCG/ACT inhaler Inhale 2 puffs into the lungs every 4 (four) hours as needed for wheezing or shortness of breath. 11/16/10  Yes Parrett, Tammy S, NP  amitriptyline (ELAVIL) 10 MG tablet Take 10 mg by mouth at bedtime as needed for sleep.  12/08/10  Yes [provider]  amLODipine (NORVASC) 5 MG tablet Take 5 mg by mouth daily.     Yes [provider]  aspirin EC 81 MG EC tablet Take 1 tablet (81 mg total) by mouth daily. Patient taking differently: Take 81 mg by mouth 2 (two) times a week.  02/07/16  Yes Eulogio Bear U, DO  celecoxib (CELEBREX) 200 MG capsule Take 200 mg by mouth daily.    Yes  [provider]  diazepam (VALIUM) 5 MG tablet Take 5 mg by mouth 2 (two) times daily as needed for anxiety.   Yes [provider]  escitalopram (LEXAPRO) 10 MG tablet Take 10 mg by mouth daily.   Yes [provider]  Fluticasone-Salmeterol (ADVAIR) 250-50 MCG/DOSE AEPB Inhale 1 puff into the lungs 2 (two) times daily.   Yes [provider]  furosemide (LASIX) 40 MG tablet Take 20 mg by mouth daily as needed for fluid.   Yes [provider]  gabapentin (NEURONTIN) 300 MG capsule Take 300 mg by mouth daily.   Yes [provider]  HYDROcodone-acetaminophen (NORCO/VICODIN) 5-325 MG tablet Take 1 tablet by mouth every 6 (six)  hours as needed for moderate pain.   Yes [provider]  levothyroxine (LEVOXYL) 25 MCG tablet Take 25 mcg by mouth daily.     Yes [provider]  omeprazole (PRILOSEC) 20 MG capsule Take 1 capsule (20 mg total) by mouth daily before breakfast. Patient taking differently: Take 40 mg by mouth daily before breakfast.  11/16/10  Yes Parrett, Tammy S, NP  pravastatin (PRAVACHOL) 80 MG tablet Take 80 mg by mouth at bedtime.    Yes [provider]  tiotropium (SPIRIVA) 18 MCG inhalation capsule Place 18 mcg into inhaler and inhale daily.     Yes [provider]  ibandronate (BONIVA) 3 MG/3ML SOLN injection Inject 3 mLs (3 mg total) into the vein every 3 (three) months. 11/16/10   Parrett, Fonnie Mu, NP     Review of Systems  Positive ROS: As above  All other systems have been reviewed and were otherwise negative with the exception of those mentioned in the HPI and as above.  Objective: Vital signs in last 24 hours: Temp:  [98.2 F (36.8 C)] 98.2 F (36.8 C) (01/21 0629) Pulse Rate:  [73] 73 (01/21 0629) Resp:  [20] 20 (01/21 0629) BP: (159)/(53) 159/53 (01/21 0629) Weight:  [69.9 kg (154 lb)] 69.9 kg (154 lb) (01/21 0629) Estimated body mass index is 24.86 kg/m as calculated from the following:   Height as of this encounter: 5\' 6"  (1.676 m).   Weight as of this encounter: 69.9 kg (154 lb).   General Appearance: Alert Head: Normocephalic, without obvious abnormality, atraumatic Eyes: PERRL, conjunctiva/corneas clear, EOM's intact,    Ears: Normal  Throat: Normal  Neck: Supple, Back: unremarkable Lungs: Clear to auscultation bilaterally, respirations unlabored Heart: Regular rate and rhythm, no murmur, rub or gallop Abdomen: Soft, non-tender Extremities: Extremities normal, atraumatic, no cyanosis or edema Skin: unremarkable  NEUROLOGIC:   Mental status: alert and oriented,Motor Exam - grossly normal Sensory Exam - grossly normal Reflexes:   Coordination - grossly normal Gait - grossly normal Balance - grossly normal Cranial Nerves: I: smell Not tested  II: visual acuity  OS: Normal  OD: Normal   II: visual fields Full to confrontation  II: pupils Equal, round, reactive to light  III,VII: ptosis None  III,IV,VI: extraocular muscles  Full ROM  V: mastication Normal  V: facial light touch sensation  Normal  V,VII: corneal reflex  Present  VII: facial muscle function - upper  Normal  VII: facial muscle function - lower Normal  VIII: hearing Not tested  IX: soft palate elevation  Normal  IX,X: gag reflex Present  XI: trapezius strength  5/5  XI: sternocleidomastoid strength 5/5  XI: neck flexion strength  5/5  XII: tongue strength  Normal    Data Review Lab Results  Component Value Date   WBC 10.6 (H) 05/20/2017   HGB 12.3 05/20/2017   HCT 37.9 05/20/2017   MCV 87.5 05/20/2017   PLT 351 05/20/2017   Lab Results  Component Value Date   NA 139 05/20/2017   K 3.6 05/20/2017   CL 105 05/20/2017   CO2 23 05/20/2017   BUN 14 05/20/2017   CREATININE 1.38 (H) 05/20/2017   GLUCOSE 109 (H) 05/20/2017   Lab Results  Component Value Date   INR 1.05 02/04/2016    Assessment/Plan: L4-5 and L5-S1 spondylolisthesis, spinal stenosis, lumbago, lumbar radiculopathy, neurogenic claudication: I have discussed the situation with the patient.  I have reviewed her imaging studies with her and pointed out the abnormalities.  We have discussed the various treatment options including surgery.  I have described the surgical treatment option of an L4-5 and L5-S1 decompression, nstrumentation, and fusion.  I have shown her surgical models.  I have given her a surgical pamphlet.  We have discussed the risks, benefits, alternatives, expected postoperative course, and likelihood of achieving our goals with surgery.  I have answered all the patient's questions.  She has decided to proceed with surgery.   Joann Barnes 05/23/2017 7:23  AM

## 2017-05-23 NOTE — Transfer of Care (Signed)
Immediate Anesthesia Transfer of Care Note  Patient: [place]  Procedure(s) Performed: POSTERIOR LUMBAR INTERBODY FUSION, INTERBODY PROSTHESIS, POSTERIOR INSTRUMENTATION LUMBAR FOUR- LUMBAR FIVE LUMBAR FIVE- SACRALONE (N/A )  Patient Location: PACU  Anesthesia Type:General  Level of Consciousness: awake, alert  and oriented  Airway & Oxygen Therapy: Patient Spontanous Breathing and Patient connected to face mask oxygen  Post-op Assessment: Report given to RN, Post -op Vital signs reviewed and stable and Patient moving all extremities X 4  Post vital signs: Reviewed and stable  Last Vitals:  Vitals:   05/23/17 0629 05/23/17 1245  BP: (!) 159/53 (!) 151/57  Pulse: 73 78  Resp: 20 18  Temp: 36.8 C (!) 36.2 C  SpO2:  100%    Last Pain:  Vitals:   05/23/17 0629  TempSrc: Oral  PainSc: 0-No pain      Patients Stated Pain Goal: 2 (84/53/64 6803)  Complications: No apparent anesthesia complications

## 2017-05-24 LAB — CBC
HCT: 25.4 % — ABNORMAL LOW (ref 36.0–46.0)
Hemoglobin: 8.3 g/dL — ABNORMAL LOW (ref 12.0–15.0)
MCH: 29 pg (ref 26.0–34.0)
MCHC: 32.7 g/dL (ref 30.0–36.0)
MCV: 88.8 fL (ref 78.0–100.0)
Platelets: 222 10*3/uL (ref 150–400)
RBC: 2.86 MIL/uL — ABNORMAL LOW (ref 3.87–5.11)
RDW: 15 % (ref 11.5–15.5)
WBC: 11.4 10*3/uL — ABNORMAL HIGH (ref 4.0–10.5)

## 2017-05-24 LAB — BASIC METABOLIC PANEL
Anion gap: 10 (ref 5–15)
BUN: 15 mg/dL (ref 6–20)
CO2: 23 mmol/L (ref 22–32)
Calcium: 8 mg/dL — ABNORMAL LOW (ref 8.9–10.3)
Chloride: 105 mmol/L (ref 101–111)
Creatinine, Ser: 1.35 mg/dL — ABNORMAL HIGH (ref 0.44–1.00)
GFR calc Af Amer: 43 mL/min — ABNORMAL LOW (ref >60)
GFR calc non Af Amer: 37 mL/min — ABNORMAL LOW (ref >60)
Glucose, Bld: 120 mg/dL — ABNORMAL HIGH (ref 65–99)
Potassium: 4 mmol/L (ref 3.5–5.1)
Sodium: 138 mmol/L (ref 135–145)

## 2017-05-24 MED ORDER — OXYCODONE HCL 5 MG PO TABS: 5.0000 mg | ORAL_TABLET | ORAL | 0 refills | Status: DC | PRN

## 2017-05-24 MED ORDER — CYCLOBENZAPRINE HCL 5 MG PO TABS: 5.0000 mg | ORAL_TABLET | Freq: Three times a day (TID) | ORAL | Status: DC | PRN

## 2017-05-24 MED ORDER — CYCLOBENZAPRINE HCL 5 MG PO TABS: 5.0000 mg | ORAL_TABLET | Freq: Three times a day (TID) | ORAL | 1 refills | Status: DC | PRN

## 2017-05-24 MED ORDER — DOCUSATE SODIUM 100 MG PO CAPS: 100.0000 mg | ORAL_CAPSULE | Freq: Two times a day (BID) | ORAL | 0 refills | Status: DC

## 2017-05-24 MED FILL — Thrombin For Soln 5000 Unit: CUTANEOUS | Qty: 5000 | Status: AC

## 2017-05-24 MED FILL — Thrombin For Soln Kit 20000 Unit: CUTANEOUS | Qty: 1 | Status: AC

## 2017-05-24 NOTE — Progress Notes (Signed)
Physical Therapy Treatment Patient Details Name: Joann Barnes MRN: [id number] DOB: Jun 17, 1940 Today's Date: 05/24/2017    History of Present Illness Pt is a 77 y/o female who presents s/p L4-S1 PLIF on 05/23/17.    PT Comments    Pt admitted with above diagnosis. Pt currently with functional limitations due to the deficits listed below (see PT Problem List). At the time of PT eval pt was able to perform transfers and ambulation with gross min guard assist for balance support and safety. Pt anticipating using rollator at d/c for energy conservation. Will need to practice stairs prior to d/c. Pt will benefit from skilled PT to increase their independence and safety with mobility to allow discharge to the venue listed below.      Follow Up Recommendations  No PT follow up;Supervision for mobility/OOB     Equipment Recommendations  3in1 (PT)(if pt does not already have)    Recommendations for Other Services       Precautions / Restrictions Precautions Precautions: Fall;Back Precaution Booklet Issued: Yes (comment) Precaution Comments: Reviewed handout with pt. Pt was cued for precautions during functional mobility.  Required Braces or Orthoses: Spinal Brace Spinal Brace: Lumbar corset;Applied in standing position Restrictions Weight Bearing Restrictions: No    Mobility  Bed Mobility               General bed mobility comments: Pt sitting up on EOB when PT arrived.   Transfers Overall transfer level: Needs assistance Equipment used: None Transfers: Sit to/from Stand Sit to Stand: Supervision         General transfer comment: Close supervision for safety. Pt was able to power-up to full stand without assistance. VC's for hand placement on seated surface for safety.   Ambulation/Gait Ambulation/Gait assistance: Min guard Ambulation Distance (Feet): 100 Feet Assistive device: 1 person hand held assist;None Gait Pattern/deviations: Step-through pattern;Decreased stride  length;Trunk flexed Gait velocity: Decreased Gait velocity interpretation: Below normal speed for age/gender General Gait Details: Initially with no UE support and then HHA provided towards end of gait training due to fatigue and SOB. Pt reports her ankles are weak and this is what was mainly limiting her during gait training.    Stairs            Wheelchair Mobility    Modified Rankin (Stroke Patients Only)       Balance Overall balance assessment: Needs assistance Sitting-balance support: Feet supported;No upper extremity supported Sitting balance-Leahy Scale: Fair     Standing balance support: No upper extremity supported;During functional activity Standing balance-Leahy Scale: Fair                              Cognition Arousal/Alertness: Awake/alert Behavior During Therapy: WFL for tasks assessed/performed Overall Cognitive Status: Within Functional Limits for tasks assessed                                        Exercises      General Comments        Pertinent Vitals/Pain Pain Assessment: Faces Faces Pain Scale: Hurts little more Pain Location: incision site Pain Descriptors / Indicators: Operative site guarding Pain Intervention(s): Limited activity within patient's tolerance;Monitored during session;Repositioned    Home Living Family/patient expects to be discharged to:: Private residence Living Arrangements: Other relatives(sister) Available Help at Discharge: Family;Available 24 hours/day Type of  Home: House Home Access: Stairs to enter   Home Layout: One level Home Equipment: Environmental consultant - 4 wheels      Prior Function Level of Independence: Independent with assistive device(s)  Gait / Transfers Assistance Needed: Used rollator for ambulation       PT Goals (current goals can now be found in the care plan section) Acute Rehab PT Goals Patient Stated Goal: home this afternoon PT Goal Formulation: With patient Time  For Goal Achievement: 05/31/17 Potential to Achieve Goals: Good    Frequency    Min 5X/week      PT Plan      Co-evaluation              AM-PAC PT "6 Clicks" Daily Activity  Outcome Measure  Difficulty turning over in bed (including adjusting bedclothes, sheets and blankets)?: None Difficulty moving from lying on back to sitting on the side of the bed? : A Little Difficulty sitting down on and standing up from a chair with arms (e.g., wheelchair, bedside commode, etc,.)?: A Little Help needed moving to and from a bed to chair (including a wheelchair)?: A Little Help needed walking in hospital room?: A Little Help needed climbing 3-5 steps with a railing? : A Little 6 Click Score: 19    End of Session Equipment Utilized During Treatment: Gait belt;Back brace Activity Tolerance: Patient tolerated treatment well Patient left: in chair;with call bell/phone within reach Nurse Communication: Mobility status PT Visit Diagnosis: Unsteadiness on feet (R26.81);Pain;Other symptoms and signs involving the nervous system (R29.898) Pain - part of body: (back)     Time: 1540-0867 PT Time Calculation (min) (ACUTE ONLY): 20 min  Charges:                       G Codes:       Joann Barnes, PT, DPT Acute Rehabilitation Services Pager: [phone]    Joann Barnes 05/24/2017, 11:43 AM

## 2017-05-24 NOTE — Progress Notes (Signed)
Physical Therapy Treatment Patient Details Name: Joann Barnes MRN: [id number] DOB: 08/26/1940 Today's Date: 05/24/2017    History of Present Illness Pt is a 77 y/o female who presents s/p L4-S1 PLIF on 05/23/17.    PT Comments    Pt progressing towards physical therapy goals. Was able to perform transfers and ambulation with gross supervision for safety. Focus of session was stair training, and pt was able to complete at a min guard assist level overall. Family present for education, including brace application, precautions, and general safety with mobility. Will continue to follow and progress as able per POC.    Follow Up Recommendations  No PT follow up;Supervision for mobility/OOB     Equipment Recommendations  None recommended by PT    Recommendations for Other Services       Precautions / Restrictions Precautions Precautions: Fall;Back Precaution Booklet Issued: Yes (comment) Precaution Comments: Reviewed precautions verbally Required Braces or Orthoses: Spinal Brace Spinal Brace: Lumbar corset;Applied in standing position Restrictions Weight Bearing Restrictions: No    Mobility  Bed Mobility               General bed mobility comments: Pt sitting up on EOB when PT arrived.   Transfers Overall transfer level: Needs assistance Equipment used: None Transfers: Sit to/from Stand Sit to Stand: Supervision         General transfer comment: Light supervision for safety. No assist required.   Ambulation/Gait Ambulation/Gait assistance: Min guard;Supervision Ambulation Distance (Feet): 75 Feet Assistive device: None Gait Pattern/deviations: Step-through pattern;Decreased stride length;Trunk flexed Gait velocity: Decreased Gait velocity interpretation: Below normal speed for age/gender General Gait Details: Guarded however no unsteadiness noted. Only ambulated to/from stairs for training.   Stairs Stairs: Yes   Stair Management: Two rails;Step to  pattern;Forwards Number of Stairs: 6 General stair comments: VC's for sequencing and general safety with stair negotiation. Sister-in-law present for education.  Wheelchair Mobility    Modified Rankin (Stroke Patients Only)       Balance Overall balance assessment: Needs assistance Sitting-balance support: Feet supported;No upper extremity supported Sitting balance-Leahy Scale: Fair     Standing balance support: No upper extremity supported;During functional activity Standing balance-Leahy Scale: Fair                              Cognition Arousal/Alertness: Awake/alert Behavior During Therapy: WFL for tasks assessed/performed Overall Cognitive Status: Within Functional Limits for tasks assessed                                        Exercises      General Comments        Pertinent Vitals/Pain Pain Assessment: Faces Faces Pain Scale: Hurts a little bit Pain Location: incision site Pain Descriptors / Indicators: Operative site guarding Pain Intervention(s): Limited activity within patient's tolerance;Monitored during session;Repositioned    Home Living Family/patient expects to be discharged to:: Private residence Living Arrangements: Other relatives(sister) Available Help at Discharge: Family;Available 24 hours/day Type of Home: House Home Access: Stairs to enter   Home Layout: One level Home Equipment: Environmental consultant - 4 wheels      Prior Function Level of Independence: Independent with assistive device(s)  Gait / Transfers Assistance Needed: Used rollator for ambulation       PT Goals (current goals can now be found in the care plan section) Acute  Rehab PT Goals Patient Stated Goal: home this afternoon PT Goal Formulation: With patient Time For Goal Achievement: 05/31/17 Potential to Achieve Goals: Good Progress towards PT goals: Progressing toward goals    Frequency    Min 5X/week      PT Plan Current plan remains  appropriate    Co-evaluation              AM-PAC PT "6 Clicks" Daily Activity  Outcome Measure  Difficulty turning over in bed (including adjusting bedclothes, sheets and blankets)?: None Difficulty moving from lying on back to sitting on the side of the bed? : A Little Difficulty sitting down on and standing up from a chair with arms (e.g., wheelchair, bedside commode, etc,.)?: A Little Help needed moving to and from a bed to chair (including a wheelchair)?: A Little Help needed walking in hospital room?: A Little Help needed climbing 3-5 steps with a railing? : A Little 6 Click Score: 19    End of Session Equipment Utilized During Treatment: Gait belt Activity Tolerance: Patient tolerated treatment well Patient left: with call bell/phone within reach;with family/visitor present;Other (comment)(Sitting EOB) Nurse Communication: Mobility status PT Visit Diagnosis: Unsteadiness on feet (R26.81);Pain;Other symptoms and signs involving the nervous system (R29.898) Pain - part of body: (back)     Time: 8381-8403 PT Time Calculation (min) (ACUTE ONLY): 19 min  Charges:  $Gait Training: 8-22 mins                    G Codes:       Rolinda Roan, PT, DPT Acute Rehabilitation Services Pager: [place] 05/24/2017, 12:56 PM

## 2017-05-24 NOTE — Progress Notes (Signed)
Pt and family given D/C instructions with Rx's, verbal understanding was provided. Pt's incision is clean and dry with no sign of infection. Pt's IV was removed prior to D/C. Pt D/C'd home via wheelchair @ 1330 per MD order. Pt is stable @ D/C and has no other needs at this time. Holli Humbles, RN

## 2017-05-24 NOTE — Anesthesia Postprocedure Evaluation (Signed)
Anesthesia Post Note  Patient: Joann Barnes  Procedure(s) Performed: POSTERIOR LUMBAR INTERBODY FUSION, INTERBODY PROSTHESIS, POSTERIOR INSTRUMENTATION LUMBAR FOUR- LUMBAR FIVE LUMBAR FIVE- SACRALONE (N/A )     Patient location during evaluation: PACU Anesthesia Type: General Level of consciousness: awake and alert Pain management: pain level controlled Vital Signs Assessment: post-procedure vital signs reviewed and stable Respiratory status: spontaneous breathing, nonlabored ventilation, respiratory function stable and patient connected to nasal cannula oxygen Cardiovascular status: blood pressure returned to baseline and stable Postop Assessment: no apparent nausea or vomiting Anesthetic complications: no    Last Vitals:  Vitals:   05/24/17 0347 05/24/17 0758  BP: (!) 109/47 (!) 107/44  Pulse: 89 87  Resp: 18 18  Temp: 37.3 C 37.3 C  SpO2: 92% 92%    Last Pain:  Vitals:   05/24/17 0900  TempSrc:   PainSc: 2                  [name]

## 2017-05-24 NOTE — Discharge Summary (Signed)
Physician Discharge Summary  Patient ID: Joann Barnes MRN: [id number] DOB/AGE: March 16, 1941 77 y.o.  Admit date: 05/23/2017 Discharge date: 05/24/2017  Admission Diagnoses: L4-5 and L5-S1 spinal stenosis, L4-5 spinal listhesis, lumbago, lumbar radiculopathy, neurogenic claudication  Discharge Diagnoses: The same Active Problems:   Spondylolisthesis of lumbar region   Discharged Condition: good  Hospital Course: I performed an L4-5 and L5-S1 decompression, instrumentation, and fusion on the patient on 05/23/2017.  On postoperative day #1 the patient is doing well.  She may go home later today.  I gave her discharge instruction and answered all her questions.  Consults: Physical therapy Significant Diagnostic Studies: None Treatments: L4-5 and L5-S1 decompression, instrumentation and fusion,  Discharge Exam: Blood pressure (!) 109/47, pulse 89, temperature 99.1 F (37.3 C), temperature source Oral, resp. rate 18, height 5\' 6"  (1.676 m), weight 69.9 kg (154 lb), SpO2 92 %. The patient is alert and pleasant.  Her strength is grossly normal lower extremities.  Her dressing is clean and dry.  Disposition: Home      Signed: Ophelia Charter 05/24/2017, 7:45 AM

## 2017-05-24 NOTE — Discharge Instructions (Signed)
Wound Care Keep incision area dry.  You may remove outer bandage after 3 days and shower.  Do not put any creams, lotions, or ointments on incision. Leave steri-strips on back.  They will fall off by themselves. Activity Walk each and every day, increasing distance each day. No lifting greater than 5 lbs.  Avoid excessive neck motion. No driving for 2 weeks; may ride as a passenger locally. Wear you brace when out of bed Diet Resume your normal diet.  Return to Work Will be discussed at you follow up appointment. Call Your Doctor If Any of These Occur Redness, drainage, or swelling at the wound.  Temperature greater than 101 degrees. Severe pain not relieved by pain medication. Increased difficulty swallowing.  Incision starts to come apart. Follow Up Appt Call today and ask for [facility] for appointment 3weeks [phone]) or for problems.  If you have any hardware placed in your spine, you will need an x-ray before your appointment.

## 2017-05-25 MED FILL — Sodium Chloride IV Soln 0.9%: INTRAVENOUS | Qty: 1000 | Status: AC

## 2017-05-25 MED FILL — Heparin Sodium (Porcine) Inj 1000 Unit/ML: INTRAMUSCULAR | Qty: 30 | Status: AC

## 2017-06-02 DIAGNOSIS — M12812 Other specific arthropathies, not elsewhere classified, left shoulder: Secondary | ICD-10-CM | POA: Diagnosis not present

## 2017-06-14 DIAGNOSIS — M4316 Spondylolisthesis, lumbar region: Secondary | ICD-10-CM | POA: Diagnosis not present

## 2017-06-23 DIAGNOSIS — H40013 Open angle with borderline findings, low risk, bilateral: Secondary | ICD-10-CM | POA: Diagnosis not present

## 2017-06-27 ENCOUNTER — Other Ambulatory Visit (HOSPITAL_COMMUNITY): Payer: Self-pay | Admitting: *Deleted

## 2017-06-28 ENCOUNTER — Ambulatory Visit (HOSPITAL_COMMUNITY)
Admission: RE | Admit: 2017-06-28 | Discharge: 2017-06-28 | Disposition: A | Discharge status: Home / Self Care | Payer: PPO | Source: Ambulatory Visit | Attending: Internal Medicine | Admitting: Internal Medicine

## 2017-06-28 DIAGNOSIS — M810 Age-related osteoporosis without current pathological fracture: Secondary | ICD-10-CM | POA: Insufficient documentation

## 2017-06-28 MED ORDER — IBANDRONATE SODIUM 3 MG/3ML IV SOLN
INTRAVENOUS | Status: AC
  Filled 2017-06-28: qty 3

## 2017-06-28 MED ORDER — IBANDRONATE SODIUM 3 MG/3ML IV SOLN
3.0000 mg | Freq: Once | INTRAVENOUS | Status: AC
  Administered 2017-06-28: 3 mg via INTRAVENOUS

## 2017-07-07 DIAGNOSIS — Z981 Arthrodesis status: Secondary | ICD-10-CM | POA: Diagnosis not present

## 2017-07-25 DIAGNOSIS — E782 Mixed hyperlipidemia: Secondary | ICD-10-CM | POA: Diagnosis not present

## 2017-07-25 DIAGNOSIS — Z23 Encounter for immunization: Secondary | ICD-10-CM | POA: Diagnosis not present

## 2017-07-25 DIAGNOSIS — M150 Primary generalized (osteo)arthritis: Secondary | ICD-10-CM | POA: Diagnosis not present

## 2017-07-25 DIAGNOSIS — M810 Age-related osteoporosis without current pathological fracture: Secondary | ICD-10-CM | POA: Diagnosis not present

## 2017-07-25 DIAGNOSIS — E039 Hypothyroidism, unspecified: Secondary | ICD-10-CM | POA: Diagnosis not present

## 2017-07-25 DIAGNOSIS — Z0000 Encounter for general adult medical examination without abnormal findings: Secondary | ICD-10-CM | POA: Diagnosis not present

## 2017-07-25 DIAGNOSIS — I10 Essential (primary) hypertension: Secondary | ICD-10-CM | POA: Diagnosis not present

## 2017-08-01 DIAGNOSIS — R011 Cardiac murmur, unspecified: Secondary | ICD-10-CM | POA: Diagnosis not present

## 2017-08-01 DIAGNOSIS — D508 Other iron deficiency anemias: Secondary | ICD-10-CM | POA: Diagnosis not present

## 2017-08-01 DIAGNOSIS — K449 Diaphragmatic hernia without obstruction or gangrene: Secondary | ICD-10-CM | POA: Diagnosis not present

## 2017-08-01 DIAGNOSIS — M810 Age-related osteoporosis without current pathological fracture: Secondary | ICD-10-CM | POA: Diagnosis not present

## 2017-08-01 DIAGNOSIS — K529 Noninfective gastroenteritis and colitis, unspecified: Secondary | ICD-10-CM | POA: Diagnosis not present

## 2017-08-01 DIAGNOSIS — M150 Primary generalized (osteo)arthritis: Secondary | ICD-10-CM | POA: Diagnosis not present

## 2017-08-01 DIAGNOSIS — I10 Essential (primary) hypertension: Secondary | ICD-10-CM | POA: Diagnosis not present

## 2017-08-01 DIAGNOSIS — J449 Chronic obstructive pulmonary disease, unspecified: Secondary | ICD-10-CM | POA: Diagnosis not present

## 2017-08-01 DIAGNOSIS — E782 Mixed hyperlipidemia: Secondary | ICD-10-CM | POA: Diagnosis not present

## 2017-08-02 ENCOUNTER — Other Ambulatory Visit: Payer: Self-pay | Admitting: Internal Medicine

## 2017-08-02 DIAGNOSIS — R011 Cardiac murmur, unspecified: Secondary | ICD-10-CM

## 2017-08-05 ENCOUNTER — Ambulatory Visit (HOSPITAL_COMMUNITY): Payer: PPO | Attending: Cardiovascular Disease

## 2017-08-05 ENCOUNTER — Other Ambulatory Visit: Payer: Self-pay

## 2017-08-05 DIAGNOSIS — R011 Cardiac murmur, unspecified: Secondary | ICD-10-CM | POA: Diagnosis not present

## 2017-08-05 DIAGNOSIS — I083 Combined rheumatic disorders of mitral, aortic and tricuspid valves: Secondary | ICD-10-CM | POA: Diagnosis not present

## 2017-08-05 DIAGNOSIS — R079 Chest pain, unspecified: Secondary | ICD-10-CM | POA: Insufficient documentation

## 2017-08-05 DIAGNOSIS — J449 Chronic obstructive pulmonary disease, unspecified: Secondary | ICD-10-CM | POA: Diagnosis not present

## 2017-08-08 DIAGNOSIS — D508 Other iron deficiency anemias: Secondary | ICD-10-CM | POA: Diagnosis not present

## 2017-09-19 ENCOUNTER — Telehealth (INDEPENDENT_AMBULATORY_CARE_PROVIDER_SITE_OTHER): Payer: Self-pay | Admitting: Surgery

## 2017-09-19 NOTE — Telephone Encounter (Signed)
Is this ok?  I assume she means troch injection?

## 2017-09-19 NOTE — Telephone Encounter (Signed)
Patient requesting hip injection with Jeneen Rinks at her appt scheduled for 5/29

## 2017-09-20 DIAGNOSIS — D508 Other iron deficiency anemias: Secondary | ICD-10-CM | POA: Diagnosis not present

## 2017-09-20 DIAGNOSIS — I10 Essential (primary) hypertension: Secondary | ICD-10-CM | POA: Diagnosis not present

## 2017-09-27 DIAGNOSIS — E039 Hypothyroidism, unspecified: Secondary | ICD-10-CM | POA: Diagnosis not present

## 2017-09-27 DIAGNOSIS — I10 Essential (primary) hypertension: Secondary | ICD-10-CM | POA: Diagnosis not present

## 2017-09-27 DIAGNOSIS — J449 Chronic obstructive pulmonary disease, unspecified: Secondary | ICD-10-CM | POA: Diagnosis not present

## 2017-09-27 DIAGNOSIS — R05 Cough: Secondary | ICD-10-CM | POA: Diagnosis not present

## 2017-09-27 DIAGNOSIS — E782 Mixed hyperlipidemia: Secondary | ICD-10-CM | POA: Diagnosis not present

## 2017-09-28 ENCOUNTER — Encounter (INDEPENDENT_AMBULATORY_CARE_PROVIDER_SITE_OTHER): Payer: Self-pay | Admitting: Surgery

## 2017-09-28 ENCOUNTER — Other Ambulatory Visit (HOSPITAL_COMMUNITY): Payer: Self-pay | Admitting: Respiratory Therapy

## 2017-09-28 ENCOUNTER — Ambulatory Visit (INDEPENDENT_AMBULATORY_CARE_PROVIDER_SITE_OTHER): Payer: PPO | Admitting: Surgery

## 2017-09-28 ENCOUNTER — Ambulatory Visit (INDEPENDENT_AMBULATORY_CARE_PROVIDER_SITE_OTHER): Payer: PPO

## 2017-09-28 VITALS — BP 134/72 | HR 73 | Ht 63.75 in | Wt 148.0 lb

## 2017-09-28 DIAGNOSIS — M7061 Trochanteric bursitis, right hip: Secondary | ICD-10-CM | POA: Diagnosis not present

## 2017-09-28 DIAGNOSIS — J441 Chronic obstructive pulmonary disease with (acute) exacerbation: Secondary | ICD-10-CM

## 2017-09-28 DIAGNOSIS — M25551 Pain in right hip: Secondary | ICD-10-CM | POA: Diagnosis not present

## 2017-09-28 MED ORDER — LIDOCAINE HCL 1 % IJ SOLN
3.0000 mL | INTRAMUSCULAR | Status: AC | PRN
  Administered 2017-09-28: 3 mL

## 2017-09-28 MED ORDER — BUPIVACAINE HCL 0.25 % IJ SOLN
6.0000 mL | INTRAMUSCULAR | Status: AC | PRN
  Administered 2017-09-28: 6 mL via INTRA_ARTICULAR

## 2017-09-28 MED ORDER — METHYLPREDNISOLONE ACETATE 40 MG/ML IJ SUSP
80.0000 mg | INTRAMUSCULAR | Status: AC | PRN
  Administered 2017-09-28: 80 mg

## 2017-09-28 NOTE — Progress Notes (Signed)
Office Visit Note   Patient: Joann Barnes           Date of Birth: [birth year]           MRN: [id number] Visit Date: 09/28/2017              Requested by: Merrilee Seashore, [address] [place], [place] [address] PCP: Merrilee Seashore, MD   Assessment & Plan: Visit Diagnoses:  1. Pain in right hip     Plan: In hopes of giving patient some relief of her lateral hip pain offered injection.  Patient sent right lateral hip was prepped with Betadine and Marcaine/Depo-Medrol injection was performed.  Tolerated without complication.  After sitting for a few minutes patient reported very good relief with Marcaine in place.  Will follow up with me in 3 months for recheck.  Keep appointment as scheduled for postop check with Dr. Arnoldo Morale neurosurgeon.  Patient did state that she has not been very compliant with wearing lumbar brace as instructed by him.  I did encourage her to wear this.  Follow-Up Instructions: Return in about 3 months (around 12/29/2017) for With Jeneen Rinks.   Orders:  Orders Placed This Encounter  Procedures  . XR HIP UNILAT W OR W/O PELVIS 2-3 VIEWS RIGHT   No orders of the defined types were placed in this encounter.     Procedures: Large Joint Inj: R greater trochanter on 09/28/2017 10:04 AM Details: 22 G 3.5 in needle, lateral approach Medications: 3 mL lidocaine 1 %; 6 mL bupivacaine 0.25 %; 80 mg methylPREDNISolone acetate 40 MG/ML Consent was given by the patient.       Clinical Data: No additional findings.   Subjective: Chief Complaint  Patient presents with  . Right Hip - Pain    HPI Patient was in today with complaints of right lateral hip pain.  States that pain is been off and on for about a year.  Aggravated with ambulating and laying on her right side.  She is status post two-level instrument a lumbar fusion by Dr. Arnoldo Morale January 2019.  No longer has right lower extremity radicular pain.  She is scheduled to follow-up with  Dr. Arnoldo Morale next month for postop check.  Denies groin pain.  No injury. Review of Systems No current cardiac pulmonary GI GU issues Objective: Vital Signs: BP 134/72 (BP Location: Right Arm, Patient Position: Sitting, Cuff Size: Normal)   Pulse 73   Ht 5' 3.75" (1.619 m)   Wt 148 lb (67.1 kg)   BMI 25.60 kg/m   Physical Exam  Constitutional: She is oriented to person, place, and time. No distress.  HENT:  Head: Normocephalic and atraumatic.  Eyes: Pupils are equal, round, and reactive to light. EOM are normal.  Musculoskeletal:  Gait is antalgic.  Negative logroll.  Negative straight leg raise.  Patient is moderate to markedly tender over the right hip greater trochanter bursa.  Also tender down the course of the IT band.  Bilateral calves nontender.  Neurovascular intact.  No focal motor deficits  Neurological: She is alert and oriented to person, place, and time.  Skin: Skin is warm and dry.  Psychiatric: She has a normal mood and affect.    Ortho Exam  Specialty Comments:  No specialty comments available.  Imaging: No results found.   PMFS History: Patient Active Problem List   Diagnosis Date Noted  . Spondylolisthesis of lumbar region 05/23/2017  . Acute kidney injury superimposed on chronic kidney disease (  [place]) 02/04/2016  . Chest pain with moderate risk of acute coronary syndrome 02/04/2016  . Tobacco use 02/04/2016  . Hypokalemia 02/04/2016  . Murmur, cardiac-suspect this is AS 02/04/2016  . Hyperlipidemia   . GERD (gastroesophageal reflux disease)   . HTN (hypertension)   . Hypothyroid   . Chest pain   . COPD (chronic obstructive pulmonary disease) ([place]) 11/16/2010  . Dyspnea 10/22/2010  . Smoking 10/22/2010   Past Medical History:  Diagnosis Date  . Aortic stenosis    Mild AS 02/2016 echo  . COPD (chronic obstructive pulmonary disease) ([place])   . Dyspnea   . GERD (gastroesophageal reflux disease)   . Hematuria   . Hernia, hiatal   . HTN  (hypertension)   . Hyperlipidemia   . Hypothyroid   . Migraine   . OA (osteoarthritis)   . OP (osteoporosis)   . Other and unspecified angina pectoris    02/2016 - had non-ischemic stress test  . Pneumonia   . Spondylolisthesis of lumbar region     Family History  Problem Relation Age of Onset  . Heart disease Mother   . Heart disease Father   . Heart disease Brother   . Cancer Sister   . Cancer Sister     Past Surgical History:  Procedure Laterality Date  . APPENDECTOMY    . CERVICAL DISC ARTHROPLASTY     x 2  . COLONOSCOPY    . ESOPHAGOGASTRODUODENOSCOPY  07/19/00  . HAND SURGERY Right 2014   Social History   Occupational History  . Occupation: retired    Fish farm manager: RETIRED  Tobacco Use  . Smoking status: Current Every Day Smoker    Packs/day: 1.00    Years: 45.00    Pack years: 45.00    Types: Cigarettes  . Smokeless tobacco: Never Used  . Tobacco comment: intolerance to chantix 2010 - got rash and got admited. Failed nicotine patch/gum - itching.  [place] tried zyban/wellbutrin  Substance and Sexual Activity  . Alcohol use: No  . Drug use: No  . Sexual activity: Not on file

## 2017-10-10 ENCOUNTER — Ambulatory Visit (HOSPITAL_COMMUNITY)
Admission: RE | Admit: 2017-10-10 | Discharge: 2017-10-10 | Disposition: A | Discharge status: Home / Self Care | Payer: PPO | Source: Ambulatory Visit | Attending: Internal Medicine | Admitting: Internal Medicine

## 2017-10-10 DIAGNOSIS — J441 Chronic obstructive pulmonary disease with (acute) exacerbation: Secondary | ICD-10-CM | POA: Diagnosis not present

## 2017-10-10 LAB — SPIROMETRY WITH GRAPH
FEF 25-75 Post: 1.82 L/sec
FEF 25-75 Pre: 0.94 L/sec
FEF2575-%Change-Post: 93 %
FEF2575-%Pred-Post: 125 %
FEF2575-%Pred-Pre: 65 %
FEV1-%Change-Post: 22 %
FEV1-%Pred-Post: 77 %
FEV1-%Pred-Pre: 63 %
FEV1-Post: 1.49 L
FEV1-Pre: 1.22 L
FEV1FVC-%Change-Post: -2 %
FEV1FVC-%Pred-Pre: 100 %
FEV6-%Change-Post: 26 %
FEV6-%Pred-Post: 84 %
FEV6-%Pred-Pre: 66 %
FEV6-Post: 2.04 L
FEV6-Pre: 1.61 L
FEV6FVC-%Pred-Post: 105 %
FEV6FVC-%Pred-Pre: 105 %
FVC-%Change-Post: 25 %
FVC-%Pred-Post: 79 %
FVC-%Pred-Pre: 63 %
FVC-Post: 2.04 L
FVC-Pre: 1.63 L
Post FEV1/FVC ratio: 73 %
Post FEV6/FVC ratio: 100 %
Pre FEV1/FVC ratio: 75 %
Pre FEV6/FVC Ratio: 100 %

## 2017-10-10 MED ORDER — ALBUTEROL SULFATE (2.5 MG/3ML) 0.083% IN NEBU
2.5000 mg | INHALATION_SOLUTION | Freq: Once | RESPIRATORY_TRACT | Status: AC
  Administered 2017-10-10: 2.5 mg via RESPIRATORY_TRACT

## 2017-10-17 ENCOUNTER — Encounter (HOSPITAL_COMMUNITY): Payer: PPO

## 2017-10-17 DIAGNOSIS — H04123 Dry eye syndrome of bilateral lacrimal glands: Secondary | ICD-10-CM | POA: Diagnosis not present

## 2017-10-17 DIAGNOSIS — Z961 Presence of intraocular lens: Secondary | ICD-10-CM | POA: Diagnosis not present

## 2017-10-17 DIAGNOSIS — H26491 Other secondary cataract, right eye: Secondary | ICD-10-CM | POA: Diagnosis not present

## 2017-10-18 ENCOUNTER — Other Ambulatory Visit (HOSPITAL_COMMUNITY): Payer: Self-pay

## 2017-10-18 DIAGNOSIS — M4316 Spondylolisthesis, lumbar region: Secondary | ICD-10-CM | POA: Diagnosis not present

## 2017-10-18 DIAGNOSIS — I10 Essential (primary) hypertension: Secondary | ICD-10-CM | POA: Diagnosis not present

## 2017-10-19 ENCOUNTER — Ambulatory Visit (HOSPITAL_COMMUNITY)
Admission: RE | Admit: 2017-10-19 | Discharge: 2017-10-19 | Disposition: A | Discharge status: Home / Self Care | Payer: PPO | Source: Ambulatory Visit | Attending: Internal Medicine | Admitting: Internal Medicine

## 2017-10-19 DIAGNOSIS — M810 Age-related osteoporosis without current pathological fracture: Secondary | ICD-10-CM | POA: Insufficient documentation

## 2017-10-19 MED ORDER — IBANDRONATE SODIUM 3 MG/3ML IV SOLN
INTRAVENOUS | Status: AC
  Administered 2017-10-19: 3 mg
  Filled 2017-10-19: qty 3

## 2017-10-19 MED ORDER — IBANDRONATE SODIUM 3 MG/3ML IV SOLN: 3.0000 mg | Freq: Once | INTRAVENOUS | Status: DC

## 2017-11-08 DIAGNOSIS — D508 Other iron deficiency anemias: Secondary | ICD-10-CM | POA: Diagnosis not present

## 2017-11-08 DIAGNOSIS — E782 Mixed hyperlipidemia: Secondary | ICD-10-CM | POA: Diagnosis not present

## 2017-11-08 DIAGNOSIS — J449 Chronic obstructive pulmonary disease, unspecified: Secondary | ICD-10-CM | POA: Diagnosis not present

## 2017-11-08 DIAGNOSIS — I10 Essential (primary) hypertension: Secondary | ICD-10-CM | POA: Diagnosis not present

## 2017-11-16 DIAGNOSIS — L820 Inflamed seborrheic keratosis: Secondary | ICD-10-CM | POA: Diagnosis not present

## 2017-12-13 DIAGNOSIS — F17200 Nicotine dependence, unspecified, uncomplicated: Secondary | ICD-10-CM | POA: Diagnosis not present

## 2017-12-13 DIAGNOSIS — J439 Emphysema, unspecified: Secondary | ICD-10-CM | POA: Diagnosis not present

## 2017-12-13 DIAGNOSIS — R042 Hemoptysis: Secondary | ICD-10-CM | POA: Diagnosis not present

## 2017-12-13 DIAGNOSIS — R05 Cough: Secondary | ICD-10-CM | POA: Diagnosis not present

## 2017-12-15 ENCOUNTER — Emergency Department (HOSPITAL_COMMUNITY)
Admission: EM | Admit: 2017-12-15 | Discharge: 2017-12-15 | Disposition: A | Discharge status: Home / Self Care | Payer: PPO | Attending: Emergency Medicine | Admitting: Emergency Medicine

## 2017-12-15 ENCOUNTER — Emergency Department (HOSPITAL_COMMUNITY): Payer: PPO

## 2017-12-15 ENCOUNTER — Other Ambulatory Visit: Payer: Self-pay

## 2017-12-15 ENCOUNTER — Encounter (HOSPITAL_COMMUNITY): Payer: Self-pay | Admitting: Emergency Medicine

## 2017-12-15 DIAGNOSIS — J449 Chronic obstructive pulmonary disease, unspecified: Secondary | ICD-10-CM | POA: Diagnosis not present

## 2017-12-15 DIAGNOSIS — Z7902 Long term (current) use of antithrombotics/antiplatelets: Secondary | ICD-10-CM | POA: Insufficient documentation

## 2017-12-15 DIAGNOSIS — Z7982 Long term (current) use of aspirin: Secondary | ICD-10-CM | POA: Insufficient documentation

## 2017-12-15 DIAGNOSIS — F17210 Nicotine dependence, cigarettes, uncomplicated: Secondary | ICD-10-CM | POA: Diagnosis not present

## 2017-12-15 DIAGNOSIS — R079 Chest pain, unspecified: Secondary | ICD-10-CM | POA: Diagnosis not present

## 2017-12-15 DIAGNOSIS — Z79899 Other long term (current) drug therapy: Secondary | ICD-10-CM | POA: Insufficient documentation

## 2017-12-15 DIAGNOSIS — J180 Bronchopneumonia, unspecified organism: Secondary | ICD-10-CM

## 2017-12-15 DIAGNOSIS — E039 Hypothyroidism, unspecified: Secondary | ICD-10-CM | POA: Insufficient documentation

## 2017-12-15 DIAGNOSIS — R042 Hemoptysis: Secondary | ICD-10-CM | POA: Diagnosis not present

## 2017-12-15 DIAGNOSIS — R0602 Shortness of breath: Secondary | ICD-10-CM | POA: Diagnosis not present

## 2017-12-15 DIAGNOSIS — I10 Essential (primary) hypertension: Secondary | ICD-10-CM | POA: Diagnosis not present

## 2017-12-15 LAB — CBC
HCT: 41.3 % (ref 36.0–46.0)
Hemoglobin: 13.4 g/dL (ref 12.0–15.0)
MCH: 29.9 pg (ref 26.0–34.0)
MCHC: 32.4 g/dL (ref 30.0–36.0)
MCV: 92.2 fL (ref 78.0–100.0)
Platelets: 408 10*3/uL — ABNORMAL HIGH (ref 150–400)
RBC: 4.48 MIL/uL (ref 3.87–5.11)
RDW: 14 % (ref 11.5–15.5)
WBC: 9.3 10*3/uL (ref 4.0–10.5)

## 2017-12-15 LAB — I-STAT TROPONIN, ED
Troponin i, poc: 0 ng/mL (ref 0.00–0.08)
Troponin i, poc: 0 ng/mL (ref 0.00–0.08)

## 2017-12-15 LAB — TROPONIN I: Troponin I: <0.03 ng/mL (ref <0.03)

## 2017-12-15 LAB — BASIC METABOLIC PANEL
Anion gap: 7 (ref 5–15)
BUN: 11 mg/dL (ref 8–23)
CO2: 27 mmol/L (ref 22–32)
Calcium: 9.6 mg/dL (ref 8.9–10.3)
Chloride: 107 mmol/L (ref 98–111)
Creatinine, Ser: 1.2 mg/dL — ABNORMAL HIGH (ref 0.44–1.00)
GFR calc Af Amer: 49 mL/min — ABNORMAL LOW (ref >60)
GFR calc non Af Amer: 42 mL/min — ABNORMAL LOW (ref >60)
Glucose, Bld: 112 mg/dL — ABNORMAL HIGH (ref 70–99)
Potassium: 4.6 mmol/L (ref 3.5–5.1)
Sodium: 141 mmol/L (ref 135–145)

## 2017-12-15 LAB — BRAIN NATRIURETIC PEPTIDE: B Natriuretic Peptide: 102.4 pg/mL — ABNORMAL HIGH (ref 0.0–100.0)

## 2017-12-15 MED ORDER — IOPAMIDOL (ISOVUE-370) INJECTION 76%
100.0000 mL | Freq: Once | INTRAVENOUS | Status: AC
Start: 2017-12-15 — End: 2017-12-15
  Administered 2017-12-15: 80 mL via INTRAVENOUS

## 2017-12-15 MED ORDER — SODIUM CHLORIDE 0.9 % IV SOLN
500.0000 mg | Freq: Once | INTRAVENOUS | Status: DC
  Filled 2017-12-15: qty 500

## 2017-12-15 MED ORDER — IOPAMIDOL (ISOVUE-370) INJECTION 76%
INTRAVENOUS | Status: AC
  Filled 2017-12-15: qty 100

## 2017-12-15 MED ORDER — AZITHROMYCIN 250 MG PO TABS: 250.0000 mg | ORAL_TABLET | Freq: Every day | ORAL | 0 refills | Status: DC

## 2017-12-15 MED ORDER — SODIUM CHLORIDE 0.9 % IV SOLN
1.0000 g | Freq: Once | INTRAVENOUS | Status: AC
  Administered 2017-12-15: 1 g via INTRAVENOUS
  Filled 2017-12-15: qty 10

## 2017-12-15 MED ORDER — AMOXICILLIN-POT CLAVULANATE 875-125 MG PO TABS: 1.0000 | ORAL_TABLET | Freq: Two times a day (BID) | ORAL | 0 refills | Status: DC

## 2017-12-15 NOTE — ED Notes (Signed)
Patient transported to CT 

## 2017-12-15 NOTE — Discharge Instructions (Signed)
Follow-up with your primary care doctor for reevaluation.  Return to the emergency department if symptoms worsen, worsening shortness of breath, worsening cough or blood in sputum.  You have been offered admission today to the hospital however as you have chosen to go home we encourage you to take all antibiotics as prescribed and follow up very closely as soon as possible with your primary care doctor.

## 2017-12-15 NOTE — ED Notes (Signed)
CT notified of IV placement. 

## 2017-12-15 NOTE — ED Notes (Signed)
ED Provider at bedside. 

## 2017-12-15 NOTE — ED Provider Notes (Signed)
[facility] Provider Note   CSN: [id number] Arrival date & time: 12/15/17  1054     History   Chief Complaint Chief Complaint  Patient presents with  . Chest Pain  . Hematemesis    HPI Joann Barnes is a 77 y.o. female with aortic stenosis, hypertension, and hyperlipidemia who presents due to 1 week of pleuritic chest pain, shortness of breath, and intermittent episodes of hemoptysis.  She says that she has been coughing up dark blood clots multiple times daily for the past week.  She has no history of this in the past.  She also reports having some right shoulder pain.  She denies nausea and vomiting.  She denies diaphoresis.  She has no history of coronary artery disease or other VTE.  She denies recent fever, chills, nausea, vomiting, and abdominal pain.  Her pain is sharp and moderate.  HPI  Past Medical History:  Diagnosis Date  . Aortic stenosis    Mild AS 02/2016 echo  . COPD (chronic obstructive pulmonary disease) ([place])   . Dyspnea   . GERD (gastroesophageal reflux disease)   . Hematuria   . Hernia, hiatal   . HTN (hypertension)   . Hyperlipidemia   . Hypothyroid   . Migraine   . OA (osteoarthritis)   . OP (osteoporosis)   . Other and unspecified angina pectoris    02/2016 - had non-ischemic stress test  . Pneumonia   . Spondylolisthesis of lumbar region     Patient Active Problem List   Diagnosis Date Noted  . Spondylolisthesis of lumbar region 05/23/2017  . Acute kidney injury superimposed on chronic kidney disease ([place]) 02/04/2016  . Chest pain with moderate risk of acute coronary syndrome 02/04/2016  . Tobacco use 02/04/2016  . Hypokalemia 02/04/2016  . Murmur, cardiac-suspect this is AS 02/04/2016  . Hyperlipidemia   . GERD (gastroesophageal reflux disease)   . HTN (hypertension)   . Hypothyroid   . Chest pain   . COPD (chronic obstructive pulmonary disease) ([place]) 11/16/2010  . Dyspnea 10/22/2010  . Smoking  10/22/2010    Past Surgical History:  Procedure Laterality Date  . APPENDECTOMY    . CERVICAL DISC ARTHROPLASTY     x 2  . COLONOSCOPY    . ESOPHAGOGASTRODUODENOSCOPY  07/19/00  . HAND SURGERY Right 2014     OB History   None      Home Medications    Prior to Admission medications   Medication Sig Start Date End Date Taking? Authorizing Provider  albuterol (ACCUNEB) 1.25 MG/3ML nebulizer solution Take 1 ampule by nebulization daily as needed for wheezing or shortness of breath.    Yes [provider]  amitriptyline (ELAVIL) 10 MG tablet Take 10 mg by mouth at bedtime as needed for sleep.  12/08/10  Yes [provider]  amLODipine (NORVASC) 5 MG tablet Take 5 mg by mouth daily.     Yes [provider]  aspirin EC 81 MG EC tablet Take 1 tablet (81 mg total) by mouth daily. 02/07/16  Yes Vann, Jessica U, DO  benzonatate (TESSALON) 100 MG capsule Take 100 mg by mouth 3 (three) times daily as needed for cough.  08/29/17  Yes [provider]  calcium carbonate (TUMS - DOSED IN MG ELEMENTAL CALCIUM) 500 MG chewable tablet Chew 2 tablets by mouth daily.   Yes [provider]  Cholecalciferol (VITAMIN D3) 2000 units TABS Take 2,000 Units by mouth daily.   Yes  [provider]  diazepam (VALIUM) 5 MG tablet Take 5 mg by mouth 2 (two) times daily as needed (for back pain or anxiety).    Yes [provider]  escitalopram (LEXAPRO) 20 MG tablet Take 20 mg by mouth daily.  08/15/17  Yes [provider]  Fluticasone-Umeclidin-Vilant (TRELEGY ELLIPTA) 100-62.5-25 MCG/INH AEPB Inhale 1 puff into the lungs daily.   Yes [provider]  furosemide (LASIX) 40 MG tablet Take 40 mg by mouth daily as needed for fluid.    Yes [provider]  gabapentin (NEURONTIN) 300 MG capsule Take 300 mg by mouth daily.   Yes [provider]  ibandronate (BONIVA) 3 MG/3ML SOLN injection Inject 3 mLs (3 mg total) into the vein  every 3 (three) months. 11/16/10  Yes Parrett, Tammy S, NP  ibuprofen (ADVIL,MOTRIN) 200 MG tablet Take 600-800 mg by mouth every 6 (six) hours as needed (for pain).   Yes [provider]  levothyroxine (LEVOXYL) 25 MCG tablet Take 25 mcg by mouth daily.     Yes [provider]  Lidocaine HCl (ASPERCREME LIDOCAINE) 4 % LIQD Apply 1 application topically See admin instructions. aaa   Yes [provider]  omeprazole (PRILOSEC) 40 MG capsule Take 40 mg by mouth daily before breakfast.  09/20/17  Yes [provider]  pravastatin (PRAVACHOL) 80 MG tablet Take 80 mg by mouth at bedtime.    Yes [provider]  PROAIR HFA 108 (90 Base) MCG/ACT inhaler Inhale 2 puffs into the lungs 2 (two) times daily.  08/25/17  Yes [provider]  albuterol (PROVENTIL,VENTOLIN) 90 MCG/ACT inhaler Inhale 2 puffs into the lungs every 4 (four) hours as needed for wheezing or shortness of breath. Patient not taking: Reported on 12/15/2017 11/16/10   Parrett, Fonnie Mu, NP  celecoxib (CELEBREX) 200 MG capsule Take 200 mg by mouth daily.  09/28/17   [provider]  cyclobenzaprine (FLEXERIL) 5 MG tablet Take 1 tablet (5 mg total) by mouth 3 (three) times daily as needed for muscle spasms. 05/24/17   Newman Pies, MD  docusate sodium (COLACE) 100 MG capsule Take 1 capsule (100 mg total) by mouth 2 (two) times daily. 05/24/17   Newman Pies, MD  Fluticasone-Salmeterol (ADVAIR DISKUS) 250-50 MCG/DOSE AEPB Advair Diskus 250 mcg-50 mcg/dose powder for inhalation    [provider]  HYDROcodone-acetaminophen (NORCO/VICODIN) 5-325 MG tablet Take 1 tablet by mouth every 8 (eight) hours as needed. for pain 09/12/17   [provider]  omeprazole (PRILOSEC) 20 MG capsule Take 1 capsule (20 mg total) by mouth daily before breakfast. Patient not taking: Reported on 12/15/2017 11/16/10   Parrett, Fonnie Mu, NP  oxyCODONE (OXY IR/ROXICODONE) 5 MG immediate release  tablet Take 1 tablet (5 mg total) by mouth every 3 (three) hours as needed for moderate pain ((score 4 to 6)). Patient not taking: Reported on 12/15/2017 05/24/17   Newman Pies, MD    Family History Family History  Problem Relation Age of Onset  . Heart disease Mother   . Heart disease Father   . Heart disease Brother   . Cancer Sister   . Cancer Sister     Social History Social History   Tobacco Use  . Smoking status: Current Every Day Smoker    Packs/day: 1.00    Years: 45.00    Pack years: 45.00    Types: Cigarettes  . Smokeless tobacco: Never Used  . Tobacco comment: intolerance to chantix 2010 - got rash  and got admited. Failed nicotine patch/gum - itching.  Nevre tried zyban/wellbutrin  Substance Use Topics  . Alcohol use: No  . Drug use: No     Allergies   Bactrim [sulfamethoxazole-trimethoprim]; Nicotine; Zyban [bupropion hcl]; Codeine; Lipitor [atorvastatin]; Benadryl [diphenhydramine hcl]; Chantix [varenicline tartrate]; Chantix [varenicline]; and Zolpidem tartrate   Review of Systems Review of Systems Review of Systems   Constitutional  Negative for fever  Negative for chills  HENT  Negative for ear pain  Negative for sore throat  Negative for difficultly swallowing  Eyes  Negative for eye pain  Negative for visual disturbance  Respiratory  +for shortness of breath  Negative for cough  CV  +for chest pain  Negative for leg swelling  Abdomen  Negative for abdominal pain  Negative for nausea  Negative for vomiting  MSK  Negative for extremity pain  Negative for back pain  Skin  Negative for rash  Negative for wound  Neuro  Negative for syncope  Negative for difficultly speaking  Psych  Negative for confusion   The remainder of the ROS was reviewed and negative except as documented above.      Physical Exam Updated Vital Signs BP (!) 134/93   Pulse 61   Temp 98.5 F (36.9 C)   Resp 15   Ht 5\' 4"  (1.626 m)   Wt  67.1 kg   SpO2 96%   BMI 25.40 kg/m   Physical Exam Physical Exam Constitutional  Nursing notes reviewed  Vital signs reviewed  HEENT  No obvious trauma  Supple without meningismus, mass, or overt JVD  EOMI  No scleral icterus or injection  Respiratory  Effort normal  CTAB  No respiratory distress  CV  Normal rate  No obvious murmurs  No pitting edema  Equal pulses in all extremities  Chest not tender to palpation  Abdomen  Soft  Non-tender  Non-distended  No peritonitis  MSK  Atraumatic  No obvious deformity  ROM appropriate  Skin  Warm  Dry  Neuro  Awake and alert  EOMI  Moving all extremities  Denies numbness/tingling  Psychiatric  Mood and affect normal        ED Treatments / Results  Labs (all labs ordered are listed, but only abnormal results are displayed) Labs Reviewed  BASIC METABOLIC PANEL - Abnormal; Notable for the following components:      Result Value   Glucose, Bld 112 (*)    Creatinine, Ser 1.20 (*)    GFR calc non Af Amer 42 (*)    GFR calc Af Amer 49 (*)    All other components within normal limits  CBC - Abnormal; Notable for the following components:   Platelets 408 (*)    All other components within normal limits  BRAIN NATRIURETIC PEPTIDE - Abnormal; Notable for the following components:   B Natriuretic Peptide 102.4 (*)    All other components within normal limits  TROPONIN I  I-STAT TROPONIN, ED    EKG EKG Interpretation  Date/Time:  Thursday December 15 2017 11:02:22 EDT Ventricular Rate:  76 PR Interval:  146 QRS Duration: 74 QT Interval:  390 QTC Calculation: 438 R Axis:   -21 Text Interpretation:  Sinus rhythm with Premature atrial complexes Low voltage QRS Borderline ECG Confirmed by Julianne Rice [id number]) on 12/15/2017 12:47:02 PM   Radiology Ct Angio Chest Pe W And/or Wo Contrast  Result Date: 12/15/2017 CLINICAL DATA:  77 year old female with chest pain shortness of breath and  hemoptysis.  EXAM: CT ANGIOGRAPHY CHEST WITH CONTRAST TECHNIQUE: Multidetector CT imaging of the chest was performed using the standard protocol during bolus administration of intravenous contrast. Multiplanar CT image reconstructions and MIPs were obtained to evaluate the vascular anatomy. CONTRAST:  66mL ISOVUE-370 IOPAMIDOL (ISOVUE-370) INJECTION 76% COMPARISON:  Chest radiographs 12/13/2017. CT Abdomen and Pelvis 05/25/2010. FINDINGS: Cardiovascular: Excellent contrast bolus timing in the pulmonary arterial tree. No focal filling defect identified in the pulmonary arteries to suggest acute pulmonary embolism. Extensive calcified coronary artery and Calcified aortic atherosclerosis. Patent thoracic aorta and proximal great vessels. No cardiomegaly or pericardial effusion. Mediastinum/Nodes: Small gastric hiatal hernia. No mediastinal lymphadenopathy. Lungs/Pleura: The central airways are patent. There are opacified airways in both the right upper (series 7, image 42), and right lower (image 95) lobes. No associated airspace opacity in the right lower lobe, but there is patchy multifocal peribronchial right upper lobe ground-glass and more solid opacity. This is contiguous with right apical scarring. No extravasated contrast is identified in the lungs or airways. Superimposed large lung volumes. Mild generalized peribronchial thickening in both lungs. Mild left apical scarring. Mild distal airway nodularity in both anterior inferior upper lobes. No consolidation. No pleural effusion. Upper Abdomen: Negative visible liver, spleen, pancreas, adrenal glands, and left kidney. Negative visible bowel in the upper abdomen. Musculoskeletal: Partially visible lower cervical ACDF. Chronic T1-T2 spondylolisthesis with interbody and probably also posterior element ankylosis. Osteopenia. No acute osseous abnormality identified. Review of the MIP images confirms the above findings. IMPRESSION: 1.  Negative for acute pulmonary  embolus. 2. Chronic lung disease with hyperinflation. Subsegmental right upper and lower lobe airway opacification, associated with peribronchial opacity in the right upper lobe compatible with bronchopneumonia. No areas of active bleeding/contrast extravasation identified. No pleural effusion. 3. Calcified coronary artery and Aortic Atherosclerosis (ICD10-I70.0). Electronically Signed   By: Genevie Ann M.D.   On: 12/15/2017 15:39    Procedures Procedures (including critical care time)  Medications Ordered in ED Medications  iopamidol (ISOVUE-370) 76 % injection (has no administration in time range)  iopamidol (ISOVUE-370) 76 % injection 100 mL (80 mLs Intravenous Contrast Given 12/15/17 1501)     Initial Impression / Assessment and Plan / ED Course  I have reviewed the triage vital signs and the nursing notes.  Pertinent labs & imaging results that were available during my care of the patient were reviewed by me and considered in my medical decision making (see chart for details).  Clinical Course as of Dec 16 1615  Thu Dec 15, 2017  1348 NIANNA IGO presents due to hemoptysis, chest pain, and shortness of breath as per above.  She is HDS.  The differential for presentation includes PE, ACS, pulmonary infarction, UGIB, cancer, vasculitis, and valvular disorder.  Given her concomitant chest pain I am primarily concerned for ACS and PE.  I believe the other differentials are less likely given her overall presentation.Her ECG reveals no signs of acute ischemia or dysrhythmia.  She has no significant abnormalities on CBC or BMP.  She has a history of known renal issues and her creatinine is 1.2.  Her GFR is 42.  Given her symptoms, I believe that a CT PE study is indicated.   Serial troponins not elevated.  No other acute lab abnormalities.  At the time of signout, her CT PE study was pending.  Care transferred to Dr. Glory Rosebush.   [NA]    Clinical Course User Index [NA] Alford Highland, MD      Final Clinical Impressions(s) /  ED Diagnoses   Final diagnoses:  Shortness of breath    ED Discharge Orders    None       Alford Highland, MD 12/15/17 1621    Julianne Rice, MD 12/16/17 [phone]

## 2017-12-15 NOTE — ED Notes (Signed)
Pt called three time and no response.

## 2017-12-15 NOTE — ED Triage Notes (Addendum)
Pt states she has been coughing up blood since Friday, had a chest xray (unsure of results) and was referred to dr Joann Barnes with [facility] but cannot see him until next Friday. States that cp and coughing up blood has worsened but pulmonologist was unable to see her sooner. Pt has disc with cxray results with her

## 2017-12-15 NOTE — ED Provider Notes (Signed)
Care assumed from Dr. Drue Flirt at [phone].  Patient presents with hemoptysis, chest pain, shortness of air over the past 6 days.  Initial troponin within normal limits.  Plan to follow-up CTA to rule out pulmonary embolus as well as repeat troponin.  Has outpatient follow-up already scheduled with pulmonology.  Pending labs and radiologic findings, patient may require admission however patient would prefer discharge.  Physical Exam  BP (!) 151/58   Pulse 99   Temp 98.5 F (36.9 C)   Resp 19   Ht 5\' 4"  (1.626 m)   Wt 67.1 kg   SpO2 93%   BMI 25.40 kg/m   Physical Exam  ED Course/Procedures   Clinical Course as of Dec 15 1745  Thu Dec 15, 2017  1348 Joann Barnes presents due to hemoptysis, chest pain, and shortness of breath as per above.  She is HDS.  The differential for presentation includes PE, ACS, pulmonary infarction, UGIB, cancer, vasculitis, and valvular disorder.  Given her concomitant chest pain I am primarily concerned for ACS and PE.  I believe the other differentials are less likely given her overall presentation.Her ECG reveals no signs of acute ischemia or dysrhythmia.  She has no significant abnormalities on CBC or BMP.  She has a history of known renal issues and her creatinine is 1.2.  Her GFR is 42.  Given her symptoms, I believe that a CT PE study is indicated.   [NA]    Clinical Course User Index [NA] Alford Highland, MD    Procedures  MDM   Delta troponin both unremarkable.  CTA of the chest shows no PE.  Does have findings concerning for possible bronchopneumonia.  Intended for inpatient admission for IV antibiotics which was offered to patient however patient does not want to be admitted to hospital at this time and will follow up closely with her pulmonologist.  Patient was given 1 g of IV Rocephin in the emergency department.  She will be discharged on Augmentin and azithromycin.  She is hemodynamically stable on room air.  Well-appearing.  No distress.  Carries  conversation well.  Has capacity to make this decision on my evaluation of patient.  Will follow up very closely with her PCP.  Strict return precautions provided.  Stable at discharge.  Case and plan of care discussed with Dr. Zenia Resides.      Corrie Dandy, MD 12/15/17 Velta Addison    Julianne Rice, MD 12/16/17 1356

## 2017-12-23 ENCOUNTER — Ambulatory Visit: Payer: PPO | Admitting: Internal Medicine

## 2017-12-23 ENCOUNTER — Encounter: Payer: Self-pay | Admitting: Internal Medicine

## 2017-12-23 VITALS — BP 130/64 | HR 67 | Ht 63.5 in | Wt 149.6 lb

## 2017-12-23 DIAGNOSIS — R042 Hemoptysis: Secondary | ICD-10-CM

## 2017-12-23 DIAGNOSIS — F17200 Nicotine dependence, unspecified, uncomplicated: Secondary | ICD-10-CM | POA: Diagnosis not present

## 2017-12-23 DIAGNOSIS — J438 Other emphysema: Secondary | ICD-10-CM | POA: Diagnosis not present

## 2017-12-23 MED ORDER — PREDNISONE 10 MG PO TABS: ORAL_TABLET | ORAL | 0 refills | Status: DC

## 2017-12-23 NOTE — Patient Instructions (Addendum)
ICD-10-CM   1. Cough with hemoptysis R04.2   2. Other emphysema ([place]) J43.8   3. Smoking F17.200     I do not see evidence of blood clot or lung cancer causing this I think it is pneumonia that is on its way out I think you might have an element of COPD exacerbation The stool other principal reasons why you are coughing of blood  Plan = No role for antibiotic because he already had 2 courses = However I would like to try prednisone - Take prednisone 40 mg daily x 2 days, then 20mg  daily x 2 days, then 10mg  daily x 2 days, then 5mg  daily x 2 days and stop = continue trelegy - quit smoking  Followup 4 weeks or sooner if needed; If coughing up of blood gets worse go to the ER or come back  - Certainly will have to consider bronchoscopy and maybe autoimmune workup if things persist

## 2017-12-23 NOTE — Progress Notes (Signed)
Subjective:    Patient ID: Joann Barnes, female    DOB: [birth year], 77 y.o.   MRN: [id number]  PCP Merrilee Seashore, MD   HPI   10/22/10 Initial Consult  DYspnea: This is a new (77 year old overweight female.  Smoker 1/2 ppd minium since age 64) problem. The current episode started more than 1 year ago (Insidous onset, several years ago). The problem occurs daily. The problem has been gradually worsening (Progressively worse past few months. Unable to breahe as deep as before. Currently dyspnic walking to mail box and back. Notices it [place] yard work as well). Pertinent negatives include no abdominal pain, chest pain, claudication, coryza, ear pain, fever, headaches, hemoptysis, leg swelling, neck pain, orthopnea, PND, rash, rhinorrhea, sore throat, sputum production, swollen glands, syncope, vomiting or wheezing. The symptoms are aggravated by exercise, any activity and weather changes (heat makes dyspnea worse. Walking to mail box and back makes dyspnea worse). Associated symptoms comments: Feels she is unable to take a deep breath. Asspociated cough + but feels currently unable to expectorate. Reports normal cardiac stress test by Dr Glade Lloyd 2005 approximately. Reports normal resting annual ekg. Risk factors include smoking. She has tried ipratropium inhalers and oral steroids (is on spiriva currently along with albuterol but no relief. has been on prednisone bursts with cold and cough which has helped  always. Sh e is confused about her medications) for the symptoms. The treatment provided no relief. There is no history of allergies, aspirin allergies, asthma, bronchiolitis, CAD, chronic lung disease, COPD, DVT, a heart failure, PE, pneumonia or a recent surgery. (Cxr nov 2008 reviewed - suggests hyperinflation. CT abd lung cut  05/25/2010 - kyphotic, possible emphsyemai in lung field)   Walking 185 feet x 3 laps in office she did not desaturate. Though she denies COPD, I noticed on med list  she is on spirivia and advair; I did not get a chance to question her about it >>referred to Pulm . Rehab.    OV 01/08/2011:  Followup Gold stage 1 copd and Smoking. Last OV NP started her on zyban but having same intolerance to it like chantix; nausea and dyspnea. So she quit zyban. She started off with bid regimen on zyban and was not taking it after meals. Nicotine patch made her itch and local rash. Benadryl for it made sleepy. In terms of dyspnea, compliant with spiriva and inhaled steroid. Clas 1-2 dyspnea is stable. She is willing to attend rehab. Willing to have flu shot today. No cough. Some associated baseline edema that is unchanged. No fever, no sputum.  PFT 11/09/2010>>FEV1 2.00 l/m  (92%), ratio 68, no significant change with SABA. Decreased mid flows 42%, DLCO 66%  And reduced. RV 133% and positive for air trapping    IOV 12/23/2017  Chief Complaint  Patient presents with  . Consult    Referred by Dr. Merrilee Seashore. Pt is a former pt of MR last seen 04/12/11. Pt was spitting up blood and was seen in the ER last thursday, 12/15/17. Pt just finished up abx last night, 12/22/17. Pt states she was told she had pna. Pt has c/o SOB and occ. cough. Denies any CP.   77 year old female that I personally last saw 7 years ago for early stage COPD. After that lost to follow-up. She split follows up with her primary care physician. In the interim and appears she has continued to smoke. She is on triple inhaler therapy  TRELEGY n June 2019 pulmonary function  test suggested Gold stage II COPD. She is here because of new onset of hemoptysis.history is gained from her and review of the outside medical records and past chart.Outside  medical chart is from her primary care physician.it appears on 12/09/2017 while after using nico block to quit smoking she started having hemoptysis. This was mi A few days latershe saw primary care physician got antibiotic course. But the hemoptysis then got worse and  12/15/2017 she went to the emergency department where she had a CT scan of the chest that I personally visualized and as documented below.Ruled out for pulmonary embolism has emphysema but has right upper and lower lobe airway opacification with infiltrates. No masses no ILD. She was given another course of antibiotic which he just finished yesterday/today but she still has some ongoing intermittent mild hemoptysis. Overall the intensity of hemoptysis is better by over 50% according to her history. She says she also had significant wheezing but this is also better with just antibiotics. She never got any prednisone. She started quit smoking with the last cigarette being this morning but she admits she can easily relapse.   IMPRESSION: 1.  Negative for acute pulmonary embolus. 2. Chronic lung disease with hyperinflation. Subsegmental right upper and lower lobe airway opacification, associated with peribronchial opacity in the right upper lobe compatible with bronchopneumonia. No areas of active bleeding/contrast extravasation identified. No pleural effusion. 3. Calcified coronary artery and Aortic Atherosclerosis (ICD10-I70.0).   Electronically Signed   By: Genevie Ann M.D.   On: 12/15/2017 15:39    Results for Joann Barnes, Joann Barnes (MRN [id number]) as of 12/23/2017 15:18  Ref. Range 10/10/2017 10:29  FEV1-Post Latest Units: L 1.49  FEV1-%Pred-Post Latest Units: %   77  FEV1-%Change-Post Latest Units: % 22  Post FEV1/FVC ratio Latest Units: % 73    Results for Joann Barnes, Joann Barnes (MRN [id number]) as of 12/23/2017 15:18  Ref. Range 12/15/2017 11:13 12/15/2017 12:46 12/15/2017 14:53 12/15/2017 17:47  B Natriuretic Peptide Latest Ref Range: 0.0 - 100.0 pg/mL  102.4 (H)    Troponin I Latest Ref Range: <0.03 ng/mL   <0.03   Troponin i, poc Latest Ref Range: 0.00 - 0.08 ng/mL 0.00   0.00  Results for Joann Barnes, Joann Barnes (MRN [id number]) as of 12/23/2017 15:18  Ref. Range 12/15/2017 11:06  WBC Latest Ref Range: 4.0 -  10.5 K/uL 9.3  Results for Joann Barnes, Joann Barnes (MRN [id number]) as of 12/23/2017 15:18  Ref. Range 05/24/2017 06:52 12/15/2017 11:06  Creatinine Latest Ref Range: 0.44 - 1.00 mg/dL 1.35 (H) 1.20 (H)  Calcium Latest Ref Range: 8.9 - 10.3 mg/dL 8.0 (L) 9.6    has a past medical history of Aortic stenosis, COPD (chronic obstructive pulmonary disease) (HCC), Dyspnea, GERD (gastroesophageal reflux disease), Hematuria, Hernia, hiatal, HTN (hypertension), Hyperlipidemia, Hypothyroid, Migraine, OA (osteoarthritis), OP (osteoporosis), Other and unspecified angina pectoris, Pneumonia, and Spondylolisthesis of lumbar region.   reports that she has been smoking cigarettes. She has a 45.00 pack-year smoking history. She has never used smokeless tobacco.  Past Surgical History:  Procedure Laterality Date  . APPENDECTOMY    . CERVICAL DISC ARTHROPLASTY     x 2  . COLONOSCOPY    . ESOPHAGOGASTRODUODENOSCOPY  07/19/00  . HAND SURGERY Right 2014    Allergies  Allergen Reactions  . Bactrim [Sulfamethoxazole-Trimethoprim] Shortness Of Breath  . Nicotine Anaphylaxis and Other (See Comments)    Rash and itch with patch locally  Chewing the gum closed her throat  . Zyban [Bupropion Hcl]  Shortness Of Breath and Nausea And Vomiting  . Codeine Itching and Nausea And Vomiting  . Lipitor [Atorvastatin] Other (See Comments)    Muscle aches  . Benadryl [Diphenhydramine Hcl] Other (See Comments)    Drowsiness   . Chantix [Varenicline Tartrate] Hives, Itching, Nausea And Vomiting and Rash  . Chantix [Varenicline] Hives, Itching, Nausea And Vomiting and Rash  . Zolpidem Tartrate Other (See Comments)    Bad dreams    Immunization History  Administered Date(s) Administered  . Influenza Whole 01/31/2010, 01/08/2011  . Influenza, High Dose Seasonal PF 05/03/2016  . Influenza-Unspecified 12/06/2015    Family History  Problem Relation Age of Onset  . Heart disease Mother   . Heart disease Father   . Heart disease  Brother   . Cancer Sister   . Cancer Sister      Current Outpatient Medications:  .  albuterol (ACCUNEB) 1.25 MG/3ML nebulizer solution, Take 1 ampule by nebulization daily as needed for wheezing or shortness of breath. , Disp: , Rfl:  .  albuterol (PROVENTIL,VENTOLIN) 90 MCG/ACT inhaler, Inhale 2 puffs into the lungs every 4 (four) hours as needed for wheezing or shortness of breath., Disp: , Rfl:  .  amitriptyline (ELAVIL) 10 MG tablet, Take 10 mg by mouth at bedtime as needed for sleep. , Disp: , Rfl:  .  amLODipine (NORVASC) 5 MG tablet, Take 5 mg by mouth daily.  , Disp: , Rfl:  .  aspirin EC 81 MG EC tablet, Take 1 tablet (81 mg total) by mouth daily., Disp: , Rfl:  .  benzonatate (TESSALON) 100 MG capsule, Take 100 mg by mouth 3 (three) times daily as needed for cough. , Disp: , Rfl: 1 .  calcium carbonate (TUMS - DOSED IN MG ELEMENTAL CALCIUM) 500 MG chewable tablet, Chew 2 tablets by mouth daily., Disp: , Rfl:  .  Cholecalciferol (VITAMIN D3) 2000 units TABS, Take 2,000 Units by mouth daily., Disp: , Rfl:  .  diazepam (VALIUM) 5 MG tablet, Take 5 mg by mouth 2 (two) times daily as needed (for back pain or anxiety). , Disp: , Rfl:  .  escitalopram (LEXAPRO) 20 MG tablet, Take 20 mg by mouth daily. , Disp: , Rfl:  .  Fluticasone-Umeclidin-Vilant (TRELEGY ELLIPTA) 100-62.5-25 MCG/INH AEPB, Inhale 1 puff into the lungs daily., Disp: , Rfl:  .  furosemide (LASIX) 40 MG tablet, Take 40 mg by mouth daily as needed for fluid. , Disp: , Rfl:  .  gabapentin (NEURONTIN) 300 MG capsule, Take 300 mg by mouth daily., Disp: , Rfl:  .  ibandronate (BONIVA) 3 MG/3ML SOLN injection, Inject 3 mLs (3 mg total) into the vein every 3 (three) months., Disp: , Rfl:  .  ibuprofen (ADVIL,MOTRIN) 200 MG tablet, Take 600-800 mg by mouth every 6 (six) hours as needed (for pain)., Disp: , Rfl:  .  Investigational - Study Medication, Cholesterol study lasting for 5 years/originated from Dr. Katrine Coho office [phone]  [phone]: Take 2 capsules by mouth daily (are either "corn oil" or "unnamed medication"), Disp: , Rfl:  .  levothyroxine (LEVOXYL) 25 MCG tablet, Take 25 mcg by mouth daily.  , Disp: , Rfl:  .  Lidocaine HCl (ASPERCREME LIDOCAINE) 4 % LIQD, Apply 1 application topically See admin instructions. Apply daily as needed to affected areas/painful sites, Disp: , Rfl:  .  omeprazole (PRILOSEC) 40 MG capsule, Take 40 mg by mouth daily before breakfast. , Disp: , Rfl:  .  pravastatin (PRAVACHOL) 80 MG  tablet, Take 80 mg by mouth at bedtime. , Disp: , Rfl:  .  PROAIR HFA 108 (90 Base) MCG/ACT inhaler, Inhale 2 puffs into the lungs 2 (two) times daily. , Disp: , Rfl:  .  cyclobenzaprine (FLEXERIL) 5 MG tablet, Take 1 tablet (5 mg total) by mouth 3 (three) times daily as needed for muscle spasms. (Patient not taking: Reported on 12/15/2017), Disp: 50 tablet, Rfl: 1    Review of Systems  Constitutional: Negative for fever and unexpected weight change.  HENT: Negative for congestion, dental problem, ear pain, nosebleeds, postnasal drip, rhinorrhea, sinus pressure, sneezing, sore throat and trouble swallowing.   Eyes: Negative for redness and itching.  Respiratory: Positive for cough, shortness of breath and wheezing. Negative for chest tightness.   Cardiovascular: Negative for palpitations and leg swelling.  Gastrointestinal: Positive for nausea. Negative for vomiting.  Genitourinary: Negative for dysuria.  Musculoskeletal: Negative for joint swelling.  Skin: Negative for rash.  Allergic/Immunologic: Negative.  Negative for environmental allergies, food allergies and immunocompromised state.  Neurological: Positive for headaches.  Hematological: Bruises/bleeds easily.  Psychiatric/Behavioral: Negative for dysphoric mood.       Objective:   Physical Exam  Constitutional: She is oriented to person, place, and time. She appears well-developed and well-nourished. No distress.  HENT:  Head:  Normocephalic and atraumatic.  Right Ear: External ear normal.  Left Ear: External ear normal.  Mouth/Throat: Oropharynx is clear and moist. No oropharyngeal exudate.  Eyes: Pupils are equal, round, and reactive to light. Conjunctivae and EOM are normal. Right eye exhibits no discharge. Left eye exhibits no discharge. No scleral icterus.  Neck: Normal range of motion. Neck supple. No JVD present. No tracheal deviation present. No thyromegaly present.  Cardiovascular: Normal rate, regular rhythm, normal heart sounds and intact distal pulses. Exam reveals no gallop and no friction rub.  No murmur heard. Pulmonary/Chest: Effort normal. No respiratory distress. She has wheezes. She has no rales. She exhibits no tenderness.  Mild scattered wheezing  Abdominal: Soft. Bowel sounds are normal. She exhibits no distension and no mass. There is no tenderness. There is no rebound and no guarding.  Musculoskeletal: Normal range of motion. She exhibits no edema or tenderness.  Lymphadenopathy:    She has no cervical adenopathy.  Neurological: She is alert and oriented to person, place, and time. She has normal reflexes. No cranial nerve deficit. She exhibits normal muscle tone. Coordination normal.  Skin: Skin is warm and dry. No rash noted. She is not diaphoretic. No erythema. No pallor.  Psychiatric: She has a normal mood and affect. Her behavior is normal. Judgment and thought content normal.  Vitals reviewed.   Vitals:   12/23/17 1527  BP: 130/64  Pulse: 67  SpO2: 97%  Weight: 149 lb 9.6 oz (67.9 kg)  Height: 5' 3.5" (1.613 m)    Body mass index is 26.08 kg/m.       Assessment & Plan:     ICD-10-CM   1. Cough with hemoptysis R04.2   2. Other emphysema ([place]) J43.8   3. Smoking F17.200      I do not see evidence of blood clot or lung cancer causing this I think it is pneumonia that is on its way out I think you might have an element of COPD exacerbation The stool other principal  reasons why you are coughing of blood  Plan = No role for antibiotic because he already had 2 courses = However I would like to try prednisone - Take  prednisone 40 mg daily x 2 days, then 20mg  daily x 2 days, then 10mg  daily x 2 days, then 5mg  daily x 2 days and stop = continue trelegy - quit smoking  Followup 4 weeks or sooner if needed; If coughing up of blood gets worse go to the ER or come back  - Certainly will have to consider bronchoscopy and maybe autoimmune workup if things persist   Dr. Brand Males, M.D., [facility].C.P Pulmonary and Critical Care Medicine Staff Physician, [place] Director - Interstitial Lung Disease  Program  Pulmonary [place] [facility] [place], [place], [address]  Pager: [phone], If no answer or between  15:00h - 7:00h: call [phone] Telephone: [phone]

## 2017-12-23 NOTE — Progress Notes (Signed)
Subjective:     Patient ID: Joann Barnes, female   DOB: Jan 04, 1941, 77 y.o.   MRN: [id number]  PCP Merrilee Seashore, MD   HPI  IOV 12/23/2017  Chief Complaint  Patient presents with  . Consult     IMPRESSION: CT A chest 1.  Negative for acute pulmonary embolus. 2. Chronic lung disease with hyperinflation. Subsegmental right upper and lower lobe airway opacification, associated with peribronchial opacity in the right upper lobe compatible with bronchopneumonia. No areas of active bleeding/contrast extravasation identified. No pleural effusion. 3. Calcified coronary artery and Aortic Atherosclerosis (ICD10-I70.0).   Electronically Signed   By: Genevie Ann M.D.   On: 12/15/2017 15:39    has a past medical history of Aortic stenosis, COPD (chronic obstructive pulmonary disease) (HCC), Dyspnea, GERD (gastroesophageal reflux disease), Hematuria, Hernia, hiatal, HTN (hypertension), Hyperlipidemia, Hypothyroid, Migraine, OA (osteoarthritis), OP (osteoporosis), Other and unspecified angina pectoris, Pneumonia, and Spondylolisthesis of lumbar region.   reports that she has been smoking cigarettes. She has a 45.00 pack-year smoking history. She has never used smokeless tobacco.  Past Surgical History:  Procedure Laterality Date  . APPENDECTOMY    . CERVICAL DISC ARTHROPLASTY     x 2  . COLONOSCOPY    . ESOPHAGOGASTRODUODENOSCOPY  07/19/00  . HAND SURGERY Right 2014    Allergies  Allergen Reactions  . Bactrim [Sulfamethoxazole-Trimethoprim] Shortness Of Breath  . Nicotine Anaphylaxis and Other (See Comments)    Rash and itch with patch locally  Chewing the gum closed her throat  . Zyban [Bupropion Hcl] Shortness Of Breath and Nausea And Vomiting  . Codeine Itching and Nausea And Vomiting  . Lipitor [Atorvastatin] Other (See Comments)    Muscle aches  . Benadryl [Diphenhydramine Hcl] Other (See Comments)    Drowsiness   . Chantix [Varenicline Tartrate] Hives, Itching,  Nausea And Vomiting and Rash  . Chantix [Varenicline] Hives, Itching, Nausea And Vomiting and Rash  . Zolpidem Tartrate Other (See Comments)    Bad dreams    Immunization History  Administered Date(s) Administered  . Influenza Whole 01/31/2010, 01/08/2011  . Influenza-Unspecified 12/06/2015    Family History  Problem Relation Age of Onset  . Heart disease Mother   . Heart disease Father   . Heart disease Brother   . Cancer Sister   . Cancer Sister      Current Outpatient Medications:  .  albuterol (ACCUNEB) 1.25 MG/3ML nebulizer solution, Take 1 ampule by nebulization daily as needed for wheezing or shortness of breath. , Disp: , Rfl:  .  albuterol (PROVENTIL,VENTOLIN) 90 MCG/ACT inhaler, Inhale 2 puffs into the lungs every 4 (four) hours as needed for wheezing or shortness of breath. (Patient not taking: Reported on 12/15/2017), Disp: , Rfl:  .  amitriptyline (ELAVIL) 10 MG tablet, Take 10 mg by mouth at bedtime as needed for sleep. , Disp: , Rfl:  .  amLODipine (NORVASC) 5 MG tablet, Take 5 mg by mouth daily.  , Disp: , Rfl:  .  amoxicillin-clavulanate (AUGMENTIN) 875-125 MG tablet, Take 1 tablet by mouth every 12 (twelve) hours., Disp: 14 tablet, Rfl: 0 .  aspirin EC 81 MG EC tablet, Take 1 tablet (81 mg total) by mouth daily., Disp: , Rfl:  .  azithromycin (ZITHROMAX) 250 MG tablet, Take 1 tablet (250 mg total) by mouth daily. Take first 2 tablets together, then 1 every day until finished., Disp: 6 tablet, Rfl: 0 .  benzonatate (TESSALON) 100 MG capsule, Take 100 mg by mouth 3 (  three) times daily as needed for cough. , Disp: , Rfl: 1 .  calcium carbonate (TUMS - DOSED IN MG ELEMENTAL CALCIUM) 500 MG chewable tablet, Chew 2 tablets by mouth daily., Disp: , Rfl:  .  celecoxib (CELEBREX) 200 MG capsule, Take 200 mg by mouth daily. , Disp: , Rfl:  .  Cholecalciferol (VITAMIN D3) 2000 units TABS, Take 2,000 Units by mouth daily., Disp: , Rfl:  .  cyclobenzaprine (FLEXERIL) 5 MG  tablet, Take 1 tablet (5 mg total) by mouth 3 (three) times daily as needed for muscle spasms. (Patient not taking: Reported on 12/15/2017), Disp: 50 tablet, Rfl: 1 .  diazepam (VALIUM) 5 MG tablet, Take 5 mg by mouth 2 (two) times daily as needed (for back pain or anxiety). , Disp: , Rfl:  .  docusate sodium (COLACE) 100 MG capsule, Take 1 capsule (100 mg total) by mouth 2 (two) times daily. (Patient not taking: Reported on 12/15/2017), Disp: 60 capsule, Rfl: 0 .  escitalopram (LEXAPRO) 20 MG tablet, Take 20 mg by mouth daily. , Disp: , Rfl:  .  Fluticasone-Salmeterol (ADVAIR DISKUS) 250-50 MCG/DOSE AEPB, , Disp: , Rfl:  .  Fluticasone-Umeclidin-Vilant (TRELEGY ELLIPTA) 100-62.5-25 MCG/INH AEPB, Inhale 1 puff into the lungs daily., Disp: , Rfl:  .  furosemide (LASIX) 40 MG tablet, Take 40 mg by mouth daily as needed for fluid. , Disp: , Rfl:  .  gabapentin (NEURONTIN) 300 MG capsule, Take 300 mg by mouth daily., Disp: , Rfl:  .  ibandronate (BONIVA) 3 MG/3ML SOLN injection, Inject 3 mLs (3 mg total) into the vein every 3 (three) months., Disp: , Rfl:  .  ibuprofen (ADVIL,MOTRIN) 200 MG tablet, Take 600-800 mg by mouth every 6 (six) hours as needed (for pain)., Disp: , Rfl:  .  Investigational - Study Medication, Cholesterol study lasting for 5 years/originated from Dr. Katrine Coho office [phone]: Take 2 capsules by mouth daily (are either "corn oil" or "unnamed medication"), Disp: , Rfl:  .  levothyroxine (LEVOXYL) 25 MCG tablet, Take 25 mcg by mouth daily.  , Disp: , Rfl:  .  Lidocaine HCl (ASPERCREME LIDOCAINE) 4 % LIQD, Apply 1 application topically See admin instructions. Apply daily as needed to affected areas/painful sites, Disp: , Rfl:  .  omeprazole (PRILOSEC) 20 MG capsule, Take 1 capsule (20 mg total) by mouth daily before breakfast. (Patient not taking: Reported on 12/15/2017), Disp: , Rfl:  .  omeprazole (PRILOSEC) 40 MG capsule, Take 40 mg by mouth daily before breakfast. , Disp:  , Rfl:  .  oxyCODONE (OXY IR/ROXICODONE) 5 MG immediate release tablet, Take 1 tablet (5 mg total) by mouth every 3 (three) hours as needed for moderate pain ((score 4 to 6)). (Patient not taking: Reported on 12/15/2017), Disp: 40 tablet, Rfl: 0 .  pravastatin (PRAVACHOL) 80 MG tablet, Take 80 mg by mouth at bedtime. , Disp: , Rfl:  .  PROAIR HFA 108 (90 Base) MCG/ACT inhaler, Inhale 2 puffs into the lungs 2 (two) times daily. , Disp: , Rfl:     Review of Systems     Objective:   Physical Exam  There were no vitals filed for this visit.  Estimated body mass index is 25.4 kg/m as calculated from the following:   Height as of 12/15/17: 5\' 4"  (1.626 m).   Weight as of 12/15/17: 148 lb (67.1 kg).     Assessment:     See other note    Plan:  See other note

## 2018-01-19 ENCOUNTER — Encounter (HOSPITAL_COMMUNITY): Payer: PPO

## 2018-01-24 ENCOUNTER — Telehealth: Payer: Self-pay | Admitting: Internal Medicine

## 2018-01-24 ENCOUNTER — Ambulatory Visit (INDEPENDENT_AMBULATORY_CARE_PROVIDER_SITE_OTHER): Payer: PPO | Admitting: Internal Medicine

## 2018-01-24 ENCOUNTER — Encounter: Payer: Self-pay | Admitting: Internal Medicine

## 2018-01-24 VITALS — BP 142/74 | HR 71 | Ht 63.5 in | Wt 149.2 lb

## 2018-01-24 DIAGNOSIS — R011 Cardiac murmur, unspecified: Secondary | ICD-10-CM | POA: Diagnosis not present

## 2018-01-24 DIAGNOSIS — R059 Cough, unspecified: Secondary | ICD-10-CM

## 2018-01-24 DIAGNOSIS — Z23 Encounter for immunization: Secondary | ICD-10-CM

## 2018-01-24 DIAGNOSIS — J438 Other emphysema: Secondary | ICD-10-CM | POA: Diagnosis not present

## 2018-01-24 DIAGNOSIS — R042 Hemoptysis: Secondary | ICD-10-CM | POA: Diagnosis not present

## 2018-01-24 DIAGNOSIS — F17200 Nicotine dependence, unspecified, uncomplicated: Secondary | ICD-10-CM

## 2018-01-24 DIAGNOSIS — R05 Cough: Secondary | ICD-10-CM

## 2018-01-24 DIAGNOSIS — Z8701 Personal history of pneumonia (recurrent): Secondary | ICD-10-CM

## 2018-01-24 NOTE — Patient Instructions (Addendum)
Cough with hemoptysis History of bacterial pneumonia   -Glad no further coughing up of blood since mid August 2019 -Do repeat CT scan of the chest mid December 2019 -In between if you cough up blood please report to [place] immediately  Other emphysema ([place]) -Stable without any flareup -Prevnar vaccine today January 24, 2018 -we will send this information to your primary care doctor -High-dose flu shot next few weeks with your primary care physician - Please talk to PCP Merrilee Seashore, MD -  and ensure you get  shingarix vaccine  Systolic murmur -This is because of her mild tightness in your aortic valve; last echocardiogram April 2019 -Monitoring for this by her primary care physician  Smoking -Please quit as soon as possible - do not vape in an attempt to quit  Follow-up -Mid December 2019 with myself or nurse practitioner but after CT scan: CAT score at followu

## 2018-01-24 NOTE — Progress Notes (Signed)
PCP Merrilee Seashore, MD   HPI   10/22/10 Initial Consult  DYspnea: This is a new (77 year old overweight female.  Smoker 1/2 ppd minium since age 42) problem. The current episode started more than 1 year ago (Insidous onset, several years ago). The problem occurs daily. The problem has been gradually worsening (Progressively worse past few months. Unable to breahe as deep as before. Currently dyspnic walking to mail box and back. Notices it [place] yard work as well). Pertinent negatives include no abdominal pain, chest pain, claudication, coryza, ear pain, fever, headaches, hemoptysis, leg swelling, neck pain, orthopnea, PND, rash, rhinorrhea, sore throat, sputum production, swollen glands, syncope, vomiting or wheezing. The symptoms are aggravated by exercise, any activity and weather changes (heat makes dyspnea worse. Walking to mail box and back makes dyspnea worse). Associated symptoms comments: Feels she is unable to take a deep breath. Asspociated cough + but feels currently unable to expectorate. Reports normal cardiac stress test by Dr Glade Lloyd 2005 approximately. Reports normal resting annual ekg. Risk factors include smoking. She has tried ipratropium inhalers and oral steroids (is on spiriva currently along with albuterol but no relief. has been on prednisone bursts with cold and cough which has helped  always. Sh e is confused about her medications) for the symptoms. The treatment provided no relief. There is no history of allergies, aspirin allergies, asthma, bronchiolitis, CAD, chronic lung disease, COPD, DVT, a heart failure, PE, pneumonia or a recent surgery. (Cxr nov 2008 reviewed - suggests hyperinflation. CT abd lung cut  05/25/2010 - kyphotic, possible emphsyemai in lung field)   Walking 185 feet x 3 laps in office she did not desaturate. Though she denies COPD, I noticed on med list she is on spirivia and advair; I did not get a chance to question her about it >>referred to  Pulm . Rehab.    OV 01/08/2011:  Followup Gold stage 1 copd and Smoking. Last OV NP started her on zyban but having same intolerance to it like chantix; nausea and dyspnea. So she quit zyban. She started off with bid regimen on zyban and was not taking it after meals. Nicotine patch made her itch and local rash. Benadryl for it made sleepy. In terms of dyspnea, compliant with spiriva and inhaled steroid. Clas 1-2 dyspnea is stable. She is willing to attend rehab. Willing to have flu shot today. No cough. Some associated baseline edema that is unchanged. No fever, no sputum.  PFT 11/09/2010>>FEV1 2.00 l/m  (92%), ratio 68, no significant change with SABA. Decreased mid flows 42%, DLCO 66%  And reduced. RV 133% and positive for air trapping    IOV 12/23/2017  Chief Complaint  Patient presents with  . Consult    Referred by Dr. Merrilee Seashore. Pt is a former pt of MR last seen 04/12/11. Pt was spitting up blood and was seen in the ER last thursday, 12/15/17. Pt just finished up abx last night, 12/22/17. Pt states she was told she had pna. Pt has c/o SOB and occ. cough. Denies any CP.   77 year old female that I personally last saw 7 years ago for early stage COPD. After that lost to follow-up. She split follows up with her primary care physician. In the interim and appears she has continued to smoke. She is on triple inhaler therapy  TRELEGY n June 2019 pulmonary function test suggested Gold stage II COPD. She is here because of new onset of hemoptysis.history is gained from her and  review of the outside medical records and past chart.Outside  medical chart is from her primary care physician.it appears on 12/09/2017 while after using nico block to quit smoking she started having hemoptysis. This was mi A few days latershe saw primary care physician got antibiotic course. But the hemoptysis then got worse and 12/15/2017 she went to the emergency department where she had a CT scan of the chest that I  personally visualized and as documented below.Ruled out for pulmonary embolism has emphysema but has right upper and lower lobe airway opacification with infiltrates. No masses no ILD. She was given another course of antibiotic which he just finished yesterday/today but she still has some ongoing intermittent mild hemoptysis. Overall the intensity of hemoptysis is better by over 50% according to her history. She says she also had significant wheezing but this is also better with just antibiotics. She never got any prednisone. She started quit smoking with the last cigarette being this morning but she admits she can easily relapse.   IMPRESSION: 1.  Negative for acute pulmonary embolus. 2. Chronic lung disease with hyperinflation. Subsegmental right upper and lower lobe airway opacification, associated with peribronchial opacity in the right upper lobe compatible with bronchopneumonia. No areas of active bleeding/contrast extravasation identified. No pleural effusion. 3. Calcified coronary artery and Aortic Atherosclerosis (ICD10-I70.0).   Electronically Signed   By: Genevie Ann M.D.   On: 12/15/2017 15:39     OV 01/24/2018  Subjective:  Patient ID: Joann Barnes, female , DOB: [birth year] , age 53 y.o. , MRN: [id number] , ADDRESS: [address] [place] [address]   01/24/2018 -   Chief Complaint  Patient presents with  . Follow-up    Pt states she is feeling better since her last visit. States she is no longer coughing up the blood.  States she is still coughing but that is better.     HPI Joann Barnes 77 y.o. -presents for follow-up of several issues  #Cough with hemoptysis related to pneumonia process on the CT scan August 2019: Since her last visit after I gave her prednisone no further hemoptysis.  She feels her pneumonia has resolved.  She feels stable.  #Smoking she continues to smoke.  She contemplated vaping in an effort to quit smoking but after seeing medial  reports she is decided not to do it.    #Emphysema: She is on Trelegy inhaler.  Symptoms are stable.  She says she will have her high-dose flu shot with her primary care physician.  She says she has never had a pneumonia vaccine and was inquiring about it.  #New issue: On exam we have discovered a systolic murmur.  She thought last echocardiogram was in 2017.  Review of the chart shows primary care physician did this in 2019 and she has mild aortic stenosis.     ROS - per HPI     has a past medical history of Aortic stenosis, COPD (chronic obstructive pulmonary disease) (HCC), Dyspnea, GERD (gastroesophageal reflux disease), Hematuria, Hernia, hiatal, HTN (hypertension), Hyperlipidemia, Hypothyroid, Migraine, OA (osteoarthritis), OP (osteoporosis), Other and unspecified angina pectoris, Pneumonia, and Spondylolisthesis of lumbar region.   reports that she has been smoking cigarettes. She has a 45.00 pack-year smoking history. She has never used smokeless tobacco.  Past Surgical History:  Procedure Laterality Date  . APPENDECTOMY    . CERVICAL DISC ARTHROPLASTY     x 2  . COLONOSCOPY    . ESOPHAGOGASTRODUODENOSCOPY  07/19/00  .  HAND SURGERY Right 2014    Allergies  Allergen Reactions  . Bactrim [Sulfamethoxazole-Trimethoprim] Shortness Of Breath  . Nicotine Anaphylaxis and Other (See Comments)    Rash and itch with patch locally  Chewing the gum closed her throat  . Zyban [Bupropion Hcl] Shortness Of Breath and Nausea And Vomiting  . Codeine Itching and Nausea And Vomiting  . Lipitor [Atorvastatin] Other (See Comments)    Muscle aches  . Benadryl [Diphenhydramine Hcl] Other (See Comments)    Drowsiness   . Chantix [Varenicline Tartrate] Hives, Itching, Nausea And Vomiting and Rash  . Chantix [Varenicline] Hives, Itching, Nausea And Vomiting and Rash  . Zolpidem Tartrate Other (See Comments)    Bad dreams    Immunization History  Administered Date(s) Administered  .  Influenza Whole 01/31/2010, 01/08/2011  . Influenza, High Dose Seasonal PF 05/03/2016  . Influenza-Unspecified 12/06/2015    Family History  Problem Relation Age of Onset  . Heart disease Mother   . Heart disease Father   . Heart disease Brother   . Cancer Sister   . Cancer Sister      Current Outpatient Medications:  .  albuterol (ACCUNEB) 1.25 MG/3ML nebulizer solution, Take 1 ampule by nebulization daily as needed for wheezing or shortness of breath. , Disp: , Rfl:  .  albuterol (PROVENTIL,VENTOLIN) 90 MCG/ACT inhaler, Inhale 2 puffs into the lungs every 4 (four) hours as needed for wheezing or shortness of breath., Disp: , Rfl:  .  amitriptyline (ELAVIL) 10 MG tablet, Take 10 mg by mouth at bedtime as needed for sleep. , Disp: , Rfl:  .  amLODipine (NORVASC) 5 MG tablet, Take 5 mg by mouth daily.  , Disp: , Rfl:  .  aspirin EC 81 MG EC tablet, Take 1 tablet (81 mg total) by mouth daily., Disp: , Rfl:  .  benzonatate (TESSALON) 100 MG capsule, Take 100 mg by mouth 3 (three) times daily as needed for cough. , Disp: , Rfl: 1 .  calcium carbonate (TUMS - DOSED IN MG ELEMENTAL CALCIUM) 500 MG chewable tablet, Chew 2 tablets by mouth daily., Disp: , Rfl:  .  Cholecalciferol (VITAMIN D3) 2000 units TABS, Take 2,000 Units by mouth daily., Disp: , Rfl:  .  cyclobenzaprine (FLEXERIL) 5 MG tablet, Take 1 tablet (5 mg total) by mouth 3 (three) times daily as needed for muscle spasms., Disp: 50 tablet, Rfl: 1 .  diazepam (VALIUM) 5 MG tablet, Take 5 mg by mouth 2 (two) times daily as needed (for back pain or anxiety). , Disp: , Rfl:  .  escitalopram (LEXAPRO) 20 MG tablet, Take 20 mg by mouth daily. , Disp: , Rfl:  .  Fluticasone-Umeclidin-Vilant (TRELEGY ELLIPTA) 100-62.5-25 MCG/INH AEPB, Inhale 1 puff into the lungs daily., Disp: , Rfl:  .  furosemide (LASIX) 40 MG tablet, Take 40 mg by mouth daily as needed for fluid. , Disp: , Rfl:  .  gabapentin (NEURONTIN) 300 MG capsule, Take 300 mg by  mouth daily., Disp: , Rfl:  .  ibandronate (BONIVA) 3 MG/3ML SOLN injection, Inject 3 mLs (3 mg total) into the vein every 3 (three) months., Disp: , Rfl:  .  ibuprofen (ADVIL,MOTRIN) 200 MG tablet, Take 600-800 mg by mouth every 6 (six) hours as needed (for pain)., Disp: , Rfl:  .  Investigational - Study Medication, Cholesterol study lasting for 5 years/originated from Dr. Katrine Coho office [phone]: Take 2 capsules by mouth daily (are either "corn oil" or "unnamed medication"), Disp: ,  Rfl:  .  levothyroxine (LEVOXYL) 25 MCG tablet, Take 25 mcg by mouth daily.  , Disp: , Rfl:  .  omeprazole (PRILOSEC) 40 MG capsule, Take 40 mg by mouth daily before breakfast. , Disp: , Rfl:  .  pravastatin (PRAVACHOL) 80 MG tablet, Take 80 mg by mouth at bedtime. , Disp: , Rfl:  .  PROAIR HFA 108 (90 Base) MCG/ACT inhaler, Inhale 2 puffs into the lungs 2 (two) times daily. , Disp: , Rfl:       Objective:   Vitals:   01/24/18 1024  BP: (!) 142/74  Pulse: 71  SpO2: 98%  Weight: 149 lb 3.2 oz (67.7 kg)  Height: 5' 3.5" (1.613 m)    Estimated body mass index is 26.02 kg/m as calculated from the following:   Height as of this encounter: 5' 3.5" (1.613 m).   Weight as of this encounter: 149 lb 3.2 oz (67.7 kg).  @WEIGHTCHANGE @  [employer]   01/24/18 1024  Weight: 149 lb 3.2 oz (67.7 kg)     Physical Exam  General Appearance:    Alert, cooperative, no distress, appears stated age - yes , sitting on - chair , Deconditioned looking - no  Head:    Normocephalic, without obvious abnormality, atraumatic  Eyes:    PERRL, conjunctiva/corneas clear,  Ears:    Normal TM's and external ear canals, both ears  Nose:   Nares normal, septum midline, mucosa normal, no drainage    or sinus tenderness. OXYGEN ON  - no . Patient is @ ra   Throat:   Lips, mucosa, and tongue normal; teeth and gums normal. Cyanosis on lips - no  Neck:   Supple, symmetrical, trachea midline, no adenopathy;    thyroid:   no enlargement/tenderness/nodules; no carotid   bruit or JVD  Back:     Symmetric, no curvature, ROM normal, no CVA tenderness  Lungs:     Distress - no , Wheeze no, Barrell Chest - no, Purse lip breathing - no, Crackles - no   Chest Wall:    No tenderness or deformity. Scars in chest no   Heart:    Regular rate and rhythm, S1 and S2 normal, no rub   or gallop, Murmur - YES. ESM - grade 2  Breast Exam:    NOT DONE  Abdomen:     Soft, non-tender, bowel sounds active all four quadrants,    no masses, no organomegaly  Genitalia:   NOT DONE  Rectal:   NOT DONE  Extremities:   Extremities normal, atraumatic, Clubbing - no, Edema - no  Pulses:   2+ and symmetric all extremities  Skin:   Stigmata of Connective Tissue Disease - no  Lymph nodes:   Cervical, supraclavicular, and axillary nodes normal  Psychiatric:  Neurologic:   normal  CAm-ICU - neg, Alert and Oriented x 3 - yes, Moves all 4s - yes, Speech - nl, Cognition - nL           Assessment:       ICD-10-CM   1. Cough with hemoptysis R04.2 CT Chest Wo Contrast  2. History of bacterial pneumonia Z87.01 CT Chest Wo Contrast  3. Other emphysema ([place]) J43.8   4. Smoking F17.200   5. Systolic murmur T55.7   6. Cough R05        Plan:     Patient Instructions  Cough with hemoptysis History of bacterial pneumonia   -Glad no further coughing up of blood since mid  August 2019 -Do repeat CT scan of the chest mid December 2019 -In between if you cough up blood please report to [place] immediately  Other emphysema ([place]) -Stable without any flareup -Prevnar vaccine today January 24, 2018 -we will send this information to your primary care doctor -High-dose flu shot next few weeks with your primary care physician - Please talk to PCP Merrilee Seashore, MD -  and ensure you get  shingarix vaccine  Systolic murmur -This is because of her mild tightness in your aortic valve; last echocardiogram April 2019 -Monitoring for this by  her primary care physician  Smoking -Please quit as soon as possible - do not vape in an attempt to quit  Follow-up -Mid December 2019 with myself or nurse practitioner but after CT scan: CAT score at followu      SIGNATURE    Dr. Brand Males, M.D., F.C.C.P,  Pulmonary and Critical Care Medicine Staff Physician, [place] Director - Interstitial Lung Disease  Program  P[facility] [place] [facility] [place], [place], [address]  Pager: [phone], If no answer or between  15:00h - 7:00h: call [phone] Telephone: [phone]  10:48 AM 01/24/2018

## 2018-01-24 NOTE — Telephone Encounter (Signed)
CT scheduled for 04/03/18 pt aware

## 2018-01-24 NOTE — Addendum Note (Signed)
Addended by: Lorretta Harp on: 01/24/2018 10:58 AM   Modules accepted: Orders

## 2018-02-01 ENCOUNTER — Other Ambulatory Visit (HOSPITAL_COMMUNITY): Payer: Self-pay

## 2018-02-02 ENCOUNTER — Ambulatory Visit (HOSPITAL_COMMUNITY)
Admission: RE | Admit: 2018-02-02 | Discharge: 2018-02-02 | Disposition: A | Discharge status: Home / Self Care | Payer: PPO | Source: Ambulatory Visit | Attending: Internal Medicine | Admitting: Internal Medicine

## 2018-02-02 DIAGNOSIS — Z803 Family history of malignant neoplasm of breast: Secondary | ICD-10-CM | POA: Diagnosis not present

## 2018-02-02 DIAGNOSIS — Z1231 Encounter for screening mammogram for malignant neoplasm of breast: Secondary | ICD-10-CM | POA: Diagnosis not present

## 2018-02-02 DIAGNOSIS — M810 Age-related osteoporosis without current pathological fracture: Secondary | ICD-10-CM | POA: Diagnosis not present

## 2018-02-02 MED ORDER — IBANDRONATE SODIUM 3 MG/3ML IV SOLN
INTRAVENOUS | Status: AC
  Administered 2018-02-02: 3 mg via INTRAVENOUS
  Filled 2018-02-02: qty 3

## 2018-02-07 DIAGNOSIS — J449 Chronic obstructive pulmonary disease, unspecified: Secondary | ICD-10-CM | POA: Diagnosis not present

## 2018-02-07 DIAGNOSIS — D508 Other iron deficiency anemias: Secondary | ICD-10-CM | POA: Diagnosis not present

## 2018-02-07 DIAGNOSIS — E782 Mixed hyperlipidemia: Secondary | ICD-10-CM | POA: Diagnosis not present

## 2018-02-14 DIAGNOSIS — E782 Mixed hyperlipidemia: Secondary | ICD-10-CM | POA: Diagnosis not present

## 2018-02-14 DIAGNOSIS — Z23 Encounter for immunization: Secondary | ICD-10-CM | POA: Diagnosis not present

## 2018-02-14 DIAGNOSIS — D508 Other iron deficiency anemias: Secondary | ICD-10-CM | POA: Diagnosis not present

## 2018-02-14 DIAGNOSIS — E039 Hypothyroidism, unspecified: Secondary | ICD-10-CM | POA: Diagnosis not present

## 2018-02-14 DIAGNOSIS — J449 Chronic obstructive pulmonary disease, unspecified: Secondary | ICD-10-CM | POA: Diagnosis not present

## 2018-02-14 DIAGNOSIS — I10 Essential (primary) hypertension: Secondary | ICD-10-CM | POA: Diagnosis not present

## 2018-04-03 ENCOUNTER — Ambulatory Visit (INDEPENDENT_AMBULATORY_CARE_PROVIDER_SITE_OTHER)
Admission: RE | Admit: 2018-04-03 | Discharge: 2018-04-03 | Disposition: A | Discharge status: Home / Self Care | Payer: PPO | Source: Ambulatory Visit | Attending: Internal Medicine | Admitting: Internal Medicine

## 2018-04-03 DIAGNOSIS — Z8701 Personal history of pneumonia (recurrent): Secondary | ICD-10-CM

## 2018-04-03 DIAGNOSIS — J189 Pneumonia, unspecified organism: Secondary | ICD-10-CM | POA: Diagnosis not present

## 2018-04-03 DIAGNOSIS — R042 Hemoptysis: Secondary | ICD-10-CM

## 2018-04-03 DIAGNOSIS — R918 Other nonspecific abnormal finding of lung field: Secondary | ICD-10-CM | POA: Diagnosis not present

## 2018-04-04 ENCOUNTER — Encounter: Payer: Self-pay | Admitting: Internal Medicine

## 2018-04-04 ENCOUNTER — Ambulatory Visit (INDEPENDENT_AMBULATORY_CARE_PROVIDER_SITE_OTHER): Payer: PPO | Admitting: Internal Medicine

## 2018-04-04 ENCOUNTER — Ambulatory Visit: Payer: PPO | Admitting: Internal Medicine

## 2018-04-04 VITALS — BP 126/82 | HR 68 | Ht 63.0 in | Wt 148.0 lb

## 2018-04-04 DIAGNOSIS — J438 Other emphysema: Secondary | ICD-10-CM | POA: Diagnosis not present

## 2018-04-04 DIAGNOSIS — R042 Hemoptysis: Secondary | ICD-10-CM | POA: Diagnosis not present

## 2018-04-04 DIAGNOSIS — F17200 Nicotine dependence, unspecified, uncomplicated: Secondary | ICD-10-CM

## 2018-04-04 DIAGNOSIS — R059 Cough, unspecified: Secondary | ICD-10-CM

## 2018-04-04 DIAGNOSIS — I10 Essential (primary) hypertension: Secondary | ICD-10-CM | POA: Diagnosis not present

## 2018-04-04 DIAGNOSIS — I2510 Atherosclerotic heart disease of native coronary artery without angina pectoris: Secondary | ICD-10-CM | POA: Diagnosis not present

## 2018-04-04 DIAGNOSIS — R05 Cough: Secondary | ICD-10-CM

## 2018-04-04 DIAGNOSIS — J449 Chronic obstructive pulmonary disease, unspecified: Secondary | ICD-10-CM | POA: Diagnosis not present

## 2018-04-04 NOTE — Patient Instructions (Addendum)
ICD-10-CM   1. Other emphysema ([place]) J43.8   2. Smoking F17.200   3. Coronary artery calcification seen on CAT scan I25.10   4. Cough with hemoptysis R04.2     Other emphysema (H[facility])  --Stable lung disease -Continue Trelegy schedule and albuterol as needed  Smoking -Try to work on quitting  Coronary artery calcification seen on CAT scan -Referred to a cardiologist: Dr Einar Gip group at [facility]  Cough with hemoptysis -Glad this is resolved -This is associated with improvement in a CT scan of the chest December 2019  Lung nodule 4 mm -Repeat CT scan of the chest without contrast in 1 year which is December 2020  Follow-up -6-9 months or sooner if needed; CAT score at follow-up

## 2018-04-04 NOTE — Progress Notes (Signed)
PCP Merrilee Seashore, MD   HPI   10/22/10 Initial Consult  DYspnea: This is a new (77 year old overweight female.  Smoker 1/2 ppd minium since age 42) problem. The current episode started more than 1 year ago (Insidous onset, several years ago). The problem occurs daily. The problem has been gradually worsening (Progressively worse past few months. Unable to breahe as deep as before. Currently dyspnic walking to mail box and back. Notices it [place] yard work as well). Pertinent negatives include no abdominal pain, chest pain, claudication, coryza, ear pain, fever, headaches, hemoptysis, leg swelling, neck pain, orthopnea, PND, rash, rhinorrhea, sore throat, sputum production, swollen glands, syncope, vomiting or wheezing. The symptoms are aggravated by exercise, any activity and weather changes (heat makes dyspnea worse. Walking to mail box and back makes dyspnea worse). Associated symptoms comments: Feels she is unable to take a deep breath. Asspociated cough + but feels currently unable to expectorate. Reports normal cardiac stress test by Dr Glade Lloyd 2005 approximately. Reports normal resting annual ekg. Risk factors include smoking. She has tried ipratropium inhalers and oral steroids (is on spiriva currently along with albuterol but no relief. has been on prednisone bursts with cold and cough which has helped  always. Sh e is confused about her medications) for the symptoms. The treatment provided no relief. There is no history of allergies, aspirin allergies, asthma, bronchiolitis, CAD, chronic lung disease, COPD, DVT, a heart failure, PE, pneumonia or a recent surgery. (Cxr nov 2008 reviewed - suggests hyperinflation. CT abd lung cut  05/25/2010 - kyphotic, possible emphsyemai in lung field)   Walking 185 feet x 3 laps in office she did not desaturate. Though she denies COPD, I noticed on med list she is on spirivia and advair; I did not get a chance to question her about it >>referred to  Pulm . Rehab.    OV 01/08/2011:  Followup Gold stage 1 copd and Smoking. Last OV NP started her on zyban but having same intolerance to it like chantix; nausea and dyspnea. So she quit zyban. She started off with bid regimen on zyban and was not taking it after meals. Nicotine patch made her itch and local rash. Benadryl for it made sleepy. In terms of dyspnea, compliant with spiriva and inhaled steroid. Clas 1-2 dyspnea is stable. She is willing to attend rehab. Willing to have flu shot today. No cough. Some associated baseline edema that is unchanged. No fever, no sputum.  PFT 11/09/2010>>FEV1 2.00 l/m  (92%), ratio 68, no significant change with SABA. Decreased mid flows 42%, DLCO 66%  And reduced. RV 133% and positive for air trapping    IOV 12/23/2017  Chief Complaint  Patient presents with  . Consult    Referred by Dr. Merrilee Seashore. Pt is a former pt of MR last seen 04/12/11. Pt was spitting up blood and was seen in the ER last thursday, 12/15/17. Pt just finished up abx last night, 12/22/17. Pt states she was told she had pna. Pt has c/o SOB and occ. cough. Denies any CP.   77 year old female that I personally last saw 7 years ago for early stage COPD. After that lost to follow-up. She split follows up with her primary care physician. In the interim and appears she has continued to smoke. She is on triple inhaler therapy  TRELEGY n June 2019 pulmonary function test suggested Gold stage II COPD. She is here because of new onset of hemoptysis.history is gained from her and  review of the outside medical records and past chart.Outside  medical chart is from her primary care physician.it appears on 12/09/2017 while after using nico block to quit smoking she started having hemoptysis. This was mi A few days latershe saw primary care physician got antibiotic course. But the hemoptysis then got worse and 12/15/2017 she went to the emergency department where she had a CT scan of the chest that I  personally visualized and as documented below.Ruled out for pulmonary embolism has emphysema but has right upper and lower lobe airway opacification with infiltrates. No masses no ILD. She was given another course of antibiotic which he just finished yesterday/today but she still has some ongoing intermittent mild hemoptysis. Overall the intensity of hemoptysis is better by over 50% according to her history. She says she also had significant wheezing but this is also better with just antibiotics. She never got any prednisone. She started quit smoking with the last cigarette being this morning but she admits she can easily relapse.   IMPRESSION: 1.  Negative for acute pulmonary embolus. 2. Chronic lung disease with hyperinflation. Subsegmental right upper and lower lobe airway opacification, associated with peribronchial opacity in the right upper lobe compatible with bronchopneumonia. No areas of active bleeding/contrast extravasation identified. No pleural effusion. 3. Calcified coronary artery and Aortic Atherosclerosis (ICD10-I70.0).   Electronically Signed   By: Genevie Ann M.D.   On: 12/15/2017 15:39     OV 01/24/2018  Subjective:  Patient ID: Joann Barnes, female , DOB: 14-Mar-1941 , age 43 y.o. , MRN: [id number] , ADDRESS: [address] [place] [address]   01/24/2018 -   Chief Complaint  Patient presents with  . Follow-up    Pt states she is feeling better since her last visit. States she is no longer coughing up the blood.  States she is still coughing but that is better.     HPI Joann Barnes 77 y.o. -presents for follow-up of several issues  #Cough with hemoptysis related to pneumonia process on the CT scan August 2019: Since her last visit after I gave her prednisone no further hemoptysis.  She feels her pneumonia has resolved.  She feels stable.  #Smoking she continues to smoke.  She contemplated vaping in an effort to quit smoking but after seeing medical  reports she is decided not to do it.    #Emphysema: She is on Trelegy inhaler.  Symptoms are stable.  She says she will have her high-dose flu shot with her primary care physician.  She says she has never had a pneumonia vaccine and was inquiring about it.  #New issue: On exam we have discovered a systolic murmur.  She thought last echocardiogram was in 2017.  Review of the chart shows primary care physician did this in 2019 and she has mild aortic stenosis.  OV 04/04/2018  Subjective:  Patient ID: Joann Barnes, female , DOB: 04-16-1941 , age 36 y.o. , MRN: [id number] , ADDRESS: [address] [place] [address]   04/04/2018 -   Chief Complaint  Patient presents with  . Follow-up    She is doing better at times but she is having more mucus.     HPI Joann Barnes 77 y.o. -is here for follow-up of several issues  Emphysema: COPD CAT score is 15.  She feels stable.  She continues Trelegy.  She is up-to-date with her vaccines  Smoking: She continues to smoke but says she will work  on quitting on her own  Hemoptysis: This is resolved.  She had a CT scan for follow-up.  This shows improvement and resolution of debris.  She has a 4 mm lung nodule that would require follow-up in a year because of her continued smoking and risk for lung cancer  Lung nodule 4 mm: As discussed above this will require follow-up in December 2020  New finding: Coronary artery calcification on CT scan.  She denies any chest pain but she does have exertional dyspnea.  She does not have a cardiologist.  In fact her primary care physician was asking her about this.  She is interested in a referral     CAT COPD Symptom & Quality of Life Score ([place]) 0 is no burden. 5 is highest burden 04/04/2018   Never Cough -> Cough all the time 3  No phlegm in chest -> Chest is full of phlegm 3  No chest tightness -> Chest feels very tight 0  No dyspnea for 1 flight stairs/hill -> Very dyspneic for 1 flight  of stairs 5  No limitations for ADL at home -> Very limited with ADL at home 0  Confident leaving home -> Not at all confident leaving home 0  Sleep soundly -> Do not sleep soundly because of lung condition 0  Lots of Energy -> No energy at all 4  TOTAL Score (max 40)  15      Personally visualized the CT chest and agree with the findings. Ct Chest Wo Contrast  Result Date: 04/03/2018 CLINICAL DATA:  Persistent cough pneumonia in August. EXAM: CT CHEST WITHOUT CONTRAST TECHNIQUE: Multidetector CT imaging of the chest was performed following the standard protocol without IV contrast. COMPARISON:  12/15/2017. FINDINGS: Cardiovascular: Atherosclerotic calcification of the arterial vasculature, including three-vessel involvement of the coronary arteries and aortic valve. Heart size normal. No pericardial effusion. Mediastinum/Nodes: Mediastinal lymph nodes are not enlarged by CT size criteria. Hilar regions are difficult to evaluate without IV contrast. No axillary adenopathy. Esophagus is grossly unremarkable. Lungs/Pleura: Biapical pleuroparenchymal scarring. Interval clearing of previously seen mucoid impaction and ground-glass in the right upper and right lower lobes. A few scattered pulmonary nodules measure 4 mm or less in size, as before. No pleural fluid. Debris is seen in bronchus intermedius. Upper Abdomen: Visualized portions of the liver, adrenal glands, kidneys, spleen pancreas stomach are grossly unremarkable. No upper abdominal adenopathy. Musculoskeletal: Degenerative changes in the spine. No worrisome lytic or sclerotic lesions. Anterolisthesis of T1 on T2 with advanced secondary degenerative disc disease. IMPRESSION: 1. No acute findings. Interval clearing of mucoid impaction and ground-glass in the right upper and right lower lobes. 2. Scattered pulmonary nodules measure 4 mm or less size, stable. No follow-up needed if patient is low-risk (and has no known or suspected primary neoplasm).  Non-contrast chest CT can be considered in 12 months if patient is high-risk. This recommendation follows the consensus statement: Guidelines for Management of Incidental Pulmonary Nodules Detected on CT Images: From the Fleischner Society 2017; Radiology 2017; 284:228-243. 3. Aortic atherosclerosis (ICD10-170.0). Three-vessel coronary artery calcification. Electronically Signed   By: Lorin Picket M.D.   On: 04/03/2018 10:40      ROS - per HPI     has a past medical history of Aortic stenosis, COPD (chronic obstructive pulmonary disease) (HCC), Dyspnea, GERD (gastroesophageal reflux disease), Hematuria, Hernia, hiatal, HTN (hypertension), Hyperlipidemia, Hypothyroid, Migraine, OA (osteoarthritis), OP (osteoporosis), Other and unspecified angina pectoris, Pneumonia, and Spondylolisthesis of lumbar region.   reports  that she has been smoking cigarettes. She has a 45.00 pack-year smoking history. She has never used smokeless tobacco.  Past Surgical History:  Procedure Laterality Date  . APPENDECTOMY    . CERVICAL DISC ARTHROPLASTY     x 2  . COLONOSCOPY    . ESOPHAGOGASTRODUODENOSCOPY  07/19/00  . HAND SURGERY Right 2014    Allergies  Allergen Reactions  . Bactrim [Sulfamethoxazole-Trimethoprim] Shortness Of Breath  . Nicotine Anaphylaxis and Other (See Comments)    Rash and itch with patch locally  Chewing the gum closed her throat  . Zyban [Bupropion Hcl] Shortness Of Breath and Nausea And Vomiting  . Codeine Itching and Nausea And Vomiting  . Lipitor [Atorvastatin] Other (See Comments)    Muscle aches  . Benadryl [Diphenhydramine Hcl] Other (See Comments)    Drowsiness   . Chantix [Varenicline Tartrate] Hives, Itching, Nausea And Vomiting and Rash  . Chantix [Varenicline] Hives, Itching, Nausea And Vomiting and Rash  . Zolpidem Tartrate Other (See Comments)    Bad dreams    Immunization History  Administered Date(s) Administered  . Influenza Whole 01/31/2010, 01/08/2011   . Influenza, High Dose Seasonal PF 05/03/2016, 02/02/2018  . Influenza-Unspecified 12/06/2015  . Pneumococcal Conjugate-13 01/24/2018  . Zoster Recombinat (Shingrix) 02/14/2018    Family History  Problem Relation Age of Onset  . Heart disease Mother   . Heart disease Father   . Heart disease Brother   . Cancer Sister   . Cancer Sister      Current Outpatient Medications:  .  albuterol (ACCUNEB) 1.25 MG/3ML nebulizer solution, Take 1 ampule by nebulization daily as needed for wheezing or shortness of breath. , Disp: , Rfl:  .  albuterol (PROVENTIL,VENTOLIN) 90 MCG/ACT inhaler, Inhale 2 puffs into the lungs every 4 (four) hours as needed for wheezing or shortness of breath., Disp: , Rfl:  .  amitriptyline (ELAVIL) 10 MG tablet, Take 10 mg by mouth at bedtime as needed for sleep. , Disp: , Rfl:  .  amLODipine (NORVASC) 5 MG tablet, Take 5 mg by mouth daily.  , Disp: , Rfl:  .  aspirin EC 81 MG EC tablet, Take 1 tablet (81 mg total) by mouth daily., Disp: , Rfl:  .  benzonatate (TESSALON) 100 MG capsule, Take 100 mg by mouth 3 (three) times daily as needed for cough. , Disp: , Rfl: 1 .  calcium carbonate (TUMS - DOSED IN MG ELEMENTAL CALCIUM) 500 MG chewable tablet, Chew 2 tablets by mouth daily., Disp: , Rfl:  .  Cholecalciferol (VITAMIN D3) 2000 units TABS, Take 2,000 Units by mouth daily., Disp: , Rfl:  .  cyclobenzaprine (FLEXERIL) 5 MG tablet, Take 1 tablet (5 mg total) by mouth 3 (three) times daily as needed for muscle spasms., Disp: 50 tablet, Rfl: 1 .  diazepam (VALIUM) 5 MG tablet, Take 5 mg by mouth 2 (two) times daily as needed (for back pain or anxiety). , Disp: , Rfl:  .  escitalopram (LEXAPRO) 20 MG tablet, Take 20 mg by mouth daily. , Disp: , Rfl:  .  Fluticasone-Umeclidin-Vilant (TRELEGY ELLIPTA) 100-62.5-25 MCG/INH AEPB, Inhale 1 puff into the lungs daily., Disp: , Rfl:  .  furosemide (LASIX) 40 MG tablet, Take 40 mg by mouth daily as needed for fluid. , Disp: , Rfl:  .   gabapentin (NEURONTIN) 300 MG capsule, Take 300 mg by mouth daily., Disp: , Rfl:  .  ibandronate (BONIVA) 3 MG/3ML SOLN injection, Inject 3 mLs (3 mg total) into  the vein every 3 (three) months., Disp: , Rfl:  .  ibuprofen (ADVIL,MOTRIN) 200 MG tablet, Take 600-800 mg by mouth every 6 (six) hours as needed (for pain)., Disp: , Rfl:  .  Investigational - Study Medication, Cholesterol study lasting for 5 years/originated from Dr. Katrine Coho office [phone]: Take 2 capsules by mouth daily (are either "corn oil" or "unnamed medication"), Disp: , Rfl:  .  levothyroxine (LEVOXYL) 25 MCG tablet, Take 25 mcg by mouth daily.  , Disp: , Rfl:  .  omeprazole (PRILOSEC) 40 MG capsule, Take 40 mg by mouth daily before breakfast. , Disp: , Rfl:  .  pravastatin (PRAVACHOL) 80 MG tablet, Take 80 mg by mouth at bedtime. , Disp: , Rfl:  .  PROAIR HFA 108 (90 Base) MCG/ACT inhaler, Inhale 2 puffs into the lungs 2 (two) times daily. , Disp: , Rfl:       Objective:   Vitals:   04/04/18 1211  BP: 126/82  Pulse: 68  SpO2: 98%  Weight: 148 lb (67.1 kg)  Height: 5\' 3"  (1.6 m)    Estimated body mass index is 26.22 kg/m as calculated from the following:   Height as of this encounter: 5\' 3"  (1.6 m).   Weight as of this encounter: 148 lb (67.1 kg).  @WEIGHTCHANGE @  [employer]   04/04/18 1211  Weight: 148 lb (67.1 kg)     Physical Exam  General Appearance:    Alert, cooperative, no distress, appears stated age - yes , Deconditioned looking - no , OBESE  - no, Sitting on Wheelchair -  no  Head:    Normocephalic, without obvious abnormality, atraumatic  Eyes:    PERRL, conjunctiva/corneas clear,  Ears:    Normal TM's and external ear canals, both ears  Nose:   Nares normal, septum midline, mucosa normal, no drainage    or sinus tenderness. OXYGEN ON  - no . Patient is @ ra   Throat:   Lips, mucosa, and tongue normal; teeth and gums normal. Cyanosis on lips - no  Neck:   Supple, symmetrical,  trachea midline, no adenopathy;    thyroid:  no enlargement/tenderness/nodules; no carotid   bruit or JVD  Back:     Symmetric, no curvature, ROM normal, no CVA tenderness  Lungs:     Distress - no , Wheeze no, Barrell Chest - no, Purse lip breathing - no, Crackles - no   Chest Wall:    No tenderness or deformity.    Heart:    Regular rate and rhythm, S1 and S2 normal, no rub   or gallop, Murmur - no  Breast Exam:    NOT DONE  Abdomen:     Soft, non-tender, bowel sounds active all four quadrants,    no masses, no organomegaly. Visceral obesity - no  Genitalia:   NOT DONE  Rectal:   NOT DONE  Extremities:   Extremities - normal, Has Cane - no, Clubbing - no, Edema - no  Pulses:   2+ and symmetric all extremities  Skin:   Stigmata of Connective Tissue Disease - no  Lymph nodes:   Cervical, supraclavicular, and axillary nodes normal  Psychiatric:  Neurologic:   Pleasant - yes, Anxious - no, Flat affect - no  CAm-ICU - neg, Alert and Oriented x 3 - yes, Moves all 4s - yes, Speech - normal, Cognition - intact           Assessment:       ICD-10-CM  1. Other emphysema ([place]) J43.8   2. Smoking F17.200   3. Coronary artery calcification seen on CAT scan I25.10 Ambulatory referral to Cardiology  4. Cough with hemoptysis R04.2   5. Cough R05 CT Chest Wo Contrast       Plan:     Patient Instructions     ICD-10-CM   1. Other emphysema ([place]) J43.8   2. Smoking F17.200   3. Coronary artery calcification seen on CAT scan I25.10   4. Cough with hemoptysis R04.2     Other emphysema (HCC)  --Stable lung disease -Continue Trelegy schedule and albuterol as needed  Smoking -Try to work on quitting  Coronary artery calcification seen on CAT scan -Referred to a cardiologist: Dr Einar Gip group at [facility] heart [facility] vascular  Cough with hemoptysis -Glad this is resolved -This is associated with improvement in a CT scan of the chest December 2019  Lung nodule 4 mm -Repeat CT scan  of the chest without contrast in 1 year which is December 2020  Follow-up -6-9 months or sooner if needed; CAT score at follow-up       SIGNATURE    Dr. Brand Males, M.D., F.C.C.P,  Pulmonary and Critical Care Medicine Staff Physician, [facility] Director - Interstitial Lung Disease  Program  Pulmonary [place] [facility] [place], [place], [address]  Pager: [phone], If no answer or between  15:00h - 7:00h: call [phone] Telephone: [phone]  12:32 PM 04/04/2018

## 2018-04-11 DIAGNOSIS — M4316 Spondylolisthesis, lumbar region: Secondary | ICD-10-CM | POA: Diagnosis not present

## 2018-04-11 DIAGNOSIS — D508 Other iron deficiency anemias: Secondary | ICD-10-CM | POA: Diagnosis not present

## 2018-04-11 DIAGNOSIS — N183 Chronic kidney disease, stage 3 (moderate): Secondary | ICD-10-CM | POA: Diagnosis not present

## 2018-04-11 DIAGNOSIS — E039 Hypothyroidism, unspecified: Secondary | ICD-10-CM | POA: Diagnosis not present

## 2018-04-11 DIAGNOSIS — E782 Mixed hyperlipidemia: Secondary | ICD-10-CM | POA: Diagnosis not present

## 2018-04-11 DIAGNOSIS — I10 Essential (primary) hypertension: Secondary | ICD-10-CM | POA: Diagnosis not present

## 2018-04-19 DIAGNOSIS — I2510 Atherosclerotic heart disease of native coronary artery without angina pectoris: Secondary | ICD-10-CM | POA: Diagnosis not present

## 2018-04-19 DIAGNOSIS — R55 Syncope and collapse: Secondary | ICD-10-CM | POA: Diagnosis not present

## 2018-04-19 DIAGNOSIS — J432 Centrilobular emphysema: Secondary | ICD-10-CM | POA: Diagnosis not present

## 2018-04-19 DIAGNOSIS — I10 Essential (primary) hypertension: Secondary | ICD-10-CM | POA: Diagnosis not present

## 2018-04-24 DIAGNOSIS — R0602 Shortness of breath: Secondary | ICD-10-CM | POA: Diagnosis not present

## 2018-05-11 DIAGNOSIS — C44629 Squamous cell carcinoma of skin of left upper limb, including shoulder: Secondary | ICD-10-CM | POA: Diagnosis not present

## 2018-05-17 DIAGNOSIS — R0789 Other chest pain: Secondary | ICD-10-CM | POA: Diagnosis not present

## 2018-05-17 DIAGNOSIS — Z136 Encounter for screening for cardiovascular disorders: Secondary | ICD-10-CM | POA: Diagnosis not present

## 2018-05-25 DIAGNOSIS — J432 Centrilobular emphysema: Secondary | ICD-10-CM | POA: Diagnosis not present

## 2018-05-25 DIAGNOSIS — I10 Essential (primary) hypertension: Secondary | ICD-10-CM | POA: Diagnosis not present

## 2018-05-25 DIAGNOSIS — R55 Syncope and collapse: Secondary | ICD-10-CM | POA: Diagnosis not present

## 2018-05-25 DIAGNOSIS — I2510 Atherosclerotic heart disease of native coronary artery without angina pectoris: Secondary | ICD-10-CM | POA: Diagnosis not present

## 2018-06-22 DIAGNOSIS — Z85828 Personal history of other malignant neoplasm of skin: Secondary | ICD-10-CM | POA: Diagnosis not present

## 2018-06-22 DIAGNOSIS — L820 Inflamed seborrheic keratosis: Secondary | ICD-10-CM | POA: Diagnosis not present

## 2018-06-22 DIAGNOSIS — Z08 Encounter for follow-up examination after completed treatment for malignant neoplasm: Secondary | ICD-10-CM | POA: Diagnosis not present

## 2018-06-23 ENCOUNTER — Other Ambulatory Visit (HOSPITAL_COMMUNITY): Payer: Self-pay

## 2018-06-26 ENCOUNTER — Ambulatory Visit (HOSPITAL_COMMUNITY)
Admission: RE | Admit: 2018-06-26 | Discharge: 2018-06-26 | Disposition: A | Discharge status: Home / Self Care | Payer: PPO | Source: Ambulatory Visit | Attending: Internal Medicine | Admitting: Internal Medicine

## 2018-06-26 DIAGNOSIS — M810 Age-related osteoporosis without current pathological fracture: Secondary | ICD-10-CM | POA: Diagnosis not present

## 2018-06-26 MED ORDER — IBANDRONATE SODIUM 3 MG/3ML IV SOLN
INTRAVENOUS | Status: AC
  Administered 2018-06-26: 3 mg via INTRAVENOUS
  Filled 2018-06-26: qty 3

## 2018-06-26 MED ORDER — IBANDRONATE SODIUM 3 MG/3ML IV SOLN
3.0000 mg | INTRAVENOUS | Status: DC
  Administered 2018-06-26: 3 mg via INTRAVENOUS

## 2018-07-11 DIAGNOSIS — E039 Hypothyroidism, unspecified: Secondary | ICD-10-CM | POA: Diagnosis not present

## 2018-07-11 DIAGNOSIS — D508 Other iron deficiency anemias: Secondary | ICD-10-CM | POA: Diagnosis not present

## 2018-07-11 DIAGNOSIS — I10 Essential (primary) hypertension: Secondary | ICD-10-CM | POA: Diagnosis not present

## 2018-07-11 DIAGNOSIS — E782 Mixed hyperlipidemia: Secondary | ICD-10-CM | POA: Diagnosis not present

## 2018-07-13 DIAGNOSIS — Z961 Presence of intraocular lens: Secondary | ICD-10-CM | POA: Diagnosis not present

## 2018-07-13 DIAGNOSIS — H26491 Other secondary cataract, right eye: Secondary | ICD-10-CM | POA: Diagnosis not present

## 2018-07-13 DIAGNOSIS — H00011 Hordeolum externum right upper eyelid: Secondary | ICD-10-CM | POA: Diagnosis not present

## 2018-07-13 DIAGNOSIS — H40013 Open angle with borderline findings, low risk, bilateral: Secondary | ICD-10-CM | POA: Diagnosis not present

## 2018-07-13 DIAGNOSIS — H35033 Hypertensive retinopathy, bilateral: Secondary | ICD-10-CM | POA: Diagnosis not present

## 2018-07-18 DIAGNOSIS — I10 Essential (primary) hypertension: Secondary | ICD-10-CM | POA: Diagnosis not present

## 2018-07-18 DIAGNOSIS — M25551 Pain in right hip: Secondary | ICD-10-CM | POA: Diagnosis not present

## 2018-07-18 DIAGNOSIS — N183 Chronic kidney disease, stage 3 (moderate): Secondary | ICD-10-CM | POA: Diagnosis not present

## 2018-07-18 DIAGNOSIS — E039 Hypothyroidism, unspecified: Secondary | ICD-10-CM | POA: Diagnosis not present

## 2018-07-18 DIAGNOSIS — E782 Mixed hyperlipidemia: Secondary | ICD-10-CM | POA: Diagnosis not present

## 2018-07-31 ENCOUNTER — Telehealth (INDEPENDENT_AMBULATORY_CARE_PROVIDER_SITE_OTHER): Payer: Self-pay | Admitting: *Deleted

## 2018-07-31 NOTE — Telephone Encounter (Signed)
I called pt to prescreen for COVID 19 before her appt 08/01/18, [facility] to go over prescreening questions

## 2018-08-01 ENCOUNTER — Ambulatory Visit (INDEPENDENT_AMBULATORY_CARE_PROVIDER_SITE_OTHER): Payer: PPO | Admitting: Orthopaedic Surgery

## 2018-08-08 DIAGNOSIS — E782 Mixed hyperlipidemia: Secondary | ICD-10-CM | POA: Diagnosis not present

## 2018-08-15 DIAGNOSIS — E039 Hypothyroidism, unspecified: Secondary | ICD-10-CM | POA: Diagnosis not present

## 2018-08-15 DIAGNOSIS — I129 Hypertensive chronic kidney disease with stage 1 through stage 4 chronic kidney disease, or unspecified chronic kidney disease: Secondary | ICD-10-CM | POA: Diagnosis not present

## 2018-08-15 DIAGNOSIS — E782 Mixed hyperlipidemia: Secondary | ICD-10-CM | POA: Diagnosis not present

## 2018-08-15 DIAGNOSIS — N183 Chronic kidney disease, stage 3 (moderate): Secondary | ICD-10-CM | POA: Diagnosis not present

## 2018-08-15 DIAGNOSIS — J449 Chronic obstructive pulmonary disease, unspecified: Secondary | ICD-10-CM | POA: Diagnosis not present

## 2018-08-16 ENCOUNTER — Encounter (INDEPENDENT_AMBULATORY_CARE_PROVIDER_SITE_OTHER): Payer: Self-pay | Admitting: Orthopaedic Surgery

## 2018-08-16 ENCOUNTER — Ambulatory Visit (INDEPENDENT_AMBULATORY_CARE_PROVIDER_SITE_OTHER): Payer: PPO | Admitting: Orthopaedic Surgery

## 2018-08-16 ENCOUNTER — Other Ambulatory Visit: Payer: Self-pay

## 2018-08-16 VITALS — Ht 65.0 in | Wt 157.0 lb

## 2018-08-16 DIAGNOSIS — M7061 Trochanteric bursitis, right hip: Secondary | ICD-10-CM

## 2018-08-16 NOTE — Progress Notes (Signed)
Office Visit Note   Patient: Joann Barnes           Date of Birth: [birth year]           MRN: [id number] Visit Date: 08/16/2018              Requested by: Merrilee Seashore, [address] [place], [place] [address] PCP: Merrilee Seashore, MD   Assessment & Plan: Visit Diagnoses:  1. Trochanteric bursitis, right hip     Plan: Right trochanteric injection performed which she tolerated well.  She can return if she has persistent symptoms.  Again we discussed smoking cessation.  Follow-Up Instructions: No follow-ups on file.   Orders:  No orders of the defined types were placed in this encounter.  No orders of the defined types were placed in this encounter.     Procedures: Large Joint Inj: R greater trochanter on 08/16/2018 11:11 AM Medications: 0.5 mL lidocaine 1 %; 2 mL bupivacaine 0.5 %; 40 mg methylPREDNISolone acetate 40 MG/ML      Clinical Data: No additional findings.   Subjective: Chief Complaint  Patient presents with  . Right Hip - Pain    HPI 78 year old female long-term patient is seen with recurrent problems with right lateral hip pain.  She is requesting trochanteric injection.  She has had them in the past years and got good relief.  She had been taking some Advil and was recommended by her PCP to stop due to abnormal kidney function.  Review of Systems posterior spinal listhesis COPD long-term smoking, hyperlipidemia GERD, hypertension, renal insufficiency.  Previous L4-S1 2 levels lumbar fusion 2019.  Otherwise negative is obtains HPI.   Objective: Vital Signs: Ht 5\' 5"  (1.651 m)   Wt 157 lb (71.2 kg)   BMI 26.13 kg/m   Physical Exam Constitutional:      Appearance: She is well-developed.  HENT:     Head: Normocephalic.     Right Ear: External ear normal.     Left Ear: External ear normal.  Eyes:     Pupils: Pupils are equal, round, and reactive to light.  Neck:     Thyroid: No thyromegaly.     Trachea: No tracheal  deviation.  Cardiovascular:     Rate and Rhythm: Normal rate.  Pulmonary:     Effort: Pulmonary effort is normal.  Abdominal:     Palpations: Abdomen is soft.  Skin:    General: Skin is warm and dry.  Neurological:     Mental Status: She is alert and oriented to person, place, and time.  Psychiatric:        Behavior: Behavior normal.     Ortho Exam patient has good shoulder range of motion no atrophy of the extremities good cervical range of motion.  Normal hip range of motion no hip flexion weakness.  Sensation over the thighs legs are normal.  Distal pulses are intact.  Patient has no significant tenderness over the right greater trochanter negative on the left no sciatic notch tenderness lumbar incisions well-healed. Specialty Comments:  No specialty comments available.  Imaging: No results found.   PMFS History: Patient Active Problem List   Diagnosis Date Noted  . Spondylolisthesis of lumbar region 05/23/2017  . Acute kidney injury superimposed on chronic kidney disease ([place]) 02/04/2016  . Chest pain with moderate risk of acute coronary syndrome 02/04/2016  . Tobacco use 02/04/2016  . Hypokalemia 02/04/2016  . Murmur, cardiac-suspect this is AS 02/04/2016  . Hyperlipidemia   .  GERD (gastroesophageal reflux disease)   . HTN (hypertension)   . Hypothyroid   . Chest pain   . COPD (chronic obstructive pulmonary disease) ([place]) 11/16/2010  . Dyspnea 10/22/2010  . Smoking 10/22/2010   Past Medical History:  Diagnosis Date  . Aortic stenosis    Mild AS 02/2016 echo  . COPD (chronic obstructive pulmonary disease) ([place])   . Dyspnea   . GERD (gastroesophageal reflux disease)   . Hematuria   . Hernia, hiatal   . HTN (hypertension)   . Hyperlipidemia   . Hypothyroid   . Migraine   . OA (osteoarthritis)   . OP (osteoporosis)   . Other and unspecified angina pectoris    02/2016 - had non-ischemic stress test  . Pneumonia   . Spondylolisthesis of lumbar region      Family History  Problem Relation Age of Onset  . Heart disease Mother   . Heart disease Father   . Heart disease Brother   . Cancer Sister   . Cancer Sister     Past Surgical History:  Procedure Laterality Date  . APPENDECTOMY    . CERVICAL DISC ARTHROPLASTY     x 2  . COLONOSCOPY    . ESOPHAGOGASTRODUODENOSCOPY  07/19/00  . HAND SURGERY Right 2014   Social History   Occupational History  . Occupation: retired    Fish farm manager: RETIRED  Tobacco Use  . Smoking status: Current Every Day Smoker    Packs/day: 1.00    Years: 45.00    Pack years: 45.00    Types: Cigarettes  . Smokeless tobacco: Never Used  . Tobacco comment: currently smoking 0.5ppd as of 12/23/17  Substance and Sexual Activity  . Alcohol use: No  . Drug use: No  . Sexual activity: Not on file

## 2018-08-19 MED ORDER — LIDOCAINE HCL 1 % IJ SOLN
0.5000 mL | INTRAMUSCULAR | Status: AC | PRN
  Administered 2018-08-16: .5 mL

## 2018-08-19 MED ORDER — METHYLPREDNISOLONE ACETATE 40 MG/ML IJ SUSP
40.0000 mg | INTRAMUSCULAR | Status: AC | PRN
  Administered 2018-08-16: 40 mg via INTRA_ARTICULAR

## 2018-08-19 MED ORDER — BUPIVACAINE HCL 0.5 % IJ SOLN
2.0000 mL | INTRAMUSCULAR | Status: AC | PRN
  Administered 2018-08-16: 2 mL via INTRA_ARTICULAR

## 2018-08-28 DIAGNOSIS — G5601 Carpal tunnel syndrome, right upper limb: Secondary | ICD-10-CM | POA: Diagnosis not present

## 2018-08-28 DIAGNOSIS — M79641 Pain in right hand: Secondary | ICD-10-CM | POA: Diagnosis not present

## 2018-08-28 DIAGNOSIS — M25521 Pain in right elbow: Secondary | ICD-10-CM | POA: Diagnosis not present

## 2018-09-01 ENCOUNTER — Other Ambulatory Visit (HOSPITAL_COMMUNITY): Payer: Self-pay | Admitting: *Deleted

## 2018-09-04 ENCOUNTER — Other Ambulatory Visit: Payer: Self-pay

## 2018-09-04 ENCOUNTER — Ambulatory Visit (HOSPITAL_COMMUNITY)
Admission: RE | Admit: 2018-09-04 | Discharge: 2018-09-04 | Disposition: A | Discharge status: Home / Self Care | Payer: PPO | Source: Ambulatory Visit | Attending: Internal Medicine | Admitting: Internal Medicine

## 2018-09-04 DIAGNOSIS — M810 Age-related osteoporosis without current pathological fracture: Secondary | ICD-10-CM | POA: Insufficient documentation

## 2018-09-04 MED ORDER — IBANDRONATE SODIUM 3 MG/3ML IV SOLN
INTRAVENOUS | Status: AC
  Administered 2018-09-04: 3 mg via INTRAVENOUS
  Filled 2018-09-04: qty 3

## 2018-09-04 MED ORDER — IBANDRONATE SODIUM 3 MG/3ML IV SOLN
3.0000 mg | Freq: Once | INTRAVENOUS | Status: AC
  Administered 2018-09-04: 10:00:00 3 mg via INTRAVENOUS

## 2018-09-21 DIAGNOSIS — G5601 Carpal tunnel syndrome, right upper limb: Secondary | ICD-10-CM | POA: Diagnosis not present

## 2018-09-21 DIAGNOSIS — M79641 Pain in right hand: Secondary | ICD-10-CM | POA: Diagnosis not present

## 2018-11-14 DIAGNOSIS — I10 Essential (primary) hypertension: Secondary | ICD-10-CM | POA: Diagnosis not present

## 2018-11-14 DIAGNOSIS — E782 Mixed hyperlipidemia: Secondary | ICD-10-CM | POA: Diagnosis not present

## 2018-11-14 DIAGNOSIS — E039 Hypothyroidism, unspecified: Secondary | ICD-10-CM | POA: Diagnosis not present

## 2018-11-14 DIAGNOSIS — Z0000 Encounter for general adult medical examination without abnormal findings: Secondary | ICD-10-CM | POA: Diagnosis not present

## 2018-11-14 DIAGNOSIS — Z7189 Other specified counseling: Secondary | ICD-10-CM | POA: Diagnosis not present

## 2018-11-16 ENCOUNTER — Other Ambulatory Visit: Payer: Self-pay | Admitting: Cardiology

## 2018-11-16 DIAGNOSIS — I951 Orthostatic hypotension: Secondary | ICD-10-CM

## 2018-11-16 NOTE — Telephone Encounter (Signed)
Please fill if necessary

## 2018-11-21 DIAGNOSIS — I700 Atherosclerosis of aorta: Secondary | ICD-10-CM | POA: Diagnosis not present

## 2018-11-21 DIAGNOSIS — J449 Chronic obstructive pulmonary disease, unspecified: Secondary | ICD-10-CM | POA: Diagnosis not present

## 2018-11-21 DIAGNOSIS — I10 Essential (primary) hypertension: Secondary | ICD-10-CM | POA: Diagnosis not present

## 2018-11-21 DIAGNOSIS — Z7189 Other specified counseling: Secondary | ICD-10-CM | POA: Diagnosis not present

## 2018-11-21 DIAGNOSIS — M150 Primary generalized (osteo)arthritis: Secondary | ICD-10-CM | POA: Diagnosis not present

## 2018-11-21 DIAGNOSIS — E039 Hypothyroidism, unspecified: Secondary | ICD-10-CM | POA: Diagnosis not present

## 2018-11-21 DIAGNOSIS — E782 Mixed hyperlipidemia: Secondary | ICD-10-CM | POA: Diagnosis not present

## 2018-11-21 DIAGNOSIS — Z0000 Encounter for general adult medical examination without abnormal findings: Secondary | ICD-10-CM | POA: Diagnosis not present

## 2018-11-21 DIAGNOSIS — R29898 Other symptoms and signs involving the musculoskeletal system: Secondary | ICD-10-CM | POA: Diagnosis not present

## 2018-11-21 DIAGNOSIS — K52831 Collagenous colitis: Secondary | ICD-10-CM | POA: Diagnosis not present

## 2018-11-30 ENCOUNTER — Ambulatory Visit: Payer: Self-pay | Admitting: Cardiology

## 2018-12-04 ENCOUNTER — Ambulatory Visit (HOSPITAL_COMMUNITY)
Admission: RE | Admit: 2018-12-04 | Discharge: 2018-12-04 | Disposition: A | Discharge status: Home / Self Care | Payer: PPO | Source: Ambulatory Visit | Attending: Internal Medicine | Admitting: Internal Medicine

## 2018-12-04 ENCOUNTER — Encounter: Payer: Self-pay | Admitting: *Deleted

## 2018-12-04 ENCOUNTER — Other Ambulatory Visit: Payer: Self-pay

## 2018-12-04 DIAGNOSIS — M810 Age-related osteoporosis without current pathological fracture: Secondary | ICD-10-CM | POA: Insufficient documentation

## 2018-12-04 MED ORDER — IBANDRONATE SODIUM 3 MG/3ML IV SOLN
INTRAVENOUS | Status: AC
  Filled 2018-12-04: qty 3

## 2018-12-04 MED ORDER — IBANDRONATE SODIUM 3 MG/3ML IV SOLN
3.0000 mg | Freq: Once | INTRAVENOUS | Status: AC
  Administered 2018-12-04: 3 mg via INTRAVENOUS

## 2018-12-05 ENCOUNTER — Other Ambulatory Visit: Payer: Self-pay

## 2018-12-05 ENCOUNTER — Encounter: Payer: Self-pay | Admitting: Diagnostic Neuroimaging

## 2018-12-05 ENCOUNTER — Ambulatory Visit: Payer: PPO | Admitting: Diagnostic Neuroimaging

## 2018-12-05 VITALS — BP 157/70 | HR 66 | Temp 97.8°F | Ht 65.0 in | Wt 157.2 lb

## 2018-12-05 DIAGNOSIS — R29898 Other symptoms and signs involving the musculoskeletal system: Secondary | ICD-10-CM

## 2018-12-05 NOTE — Progress Notes (Signed)
[facility]  PATIENT: Joann Barnes DOB: November 17, 1940  REFERRING CLINICIAN: Ashby Dawes HISTORY FROM: patient REASON FOR VISIT: new consult    HISTORICAL  CHIEF COMPLAINT:  Chief Complaint  Patient presents with  . Hand/arm weakness    rm 7 New Pt, " right arm/hand weakness 3 months ago"    HISTORY OF PRESENT ILLNESS:   78 year old female here for evaluation of right arm numbness and weakness.  Symptoms started gradually in April 2020 have progressively worsened.  Now patient has difficulty gripping, using her right hand, handwriting, lifting things.  Symptoms are mainly noted from her right elbow to her right hand.  She has particular difficulty moving her right thumb.  She had some type of surgery in her right thumb several years ago.  Patient followed up with hand surgeon for this issue, tried injection, which did not help.  No problems with left arm.  No problems with legs.  No vision speech or swallowing issues.  No problems with her right shoulder or neck.  She has had 2 neck surgeries in the past.   REVIEW OF SYSTEMS: Full 14 system review of systems performed and negative with exception of: As per HPI.  ALLERGIES: Allergies  Allergen Reactions  . Bactrim [Sulfamethoxazole-Trimethoprim] Shortness Of Breath  . Nicotine Anaphylaxis and Other (See Comments)    Rash and itch with patch locally  Chewing the gum closed her throat  . Zyban [Bupropion Hcl] Shortness Of Breath and Nausea And Vomiting  . Codeine Itching and Nausea And Vomiting  . Lipitor [Atorvastatin] Other (See Comments)    Muscle aches  . Benadryl [Diphenhydramine Hcl] Other (See Comments)    Drowsiness   . Chantix [Varenicline Tartrate] Hives, Itching, Nausea And Vomiting and Rash  . Chantix [Varenicline] Hives, Itching, Nausea And Vomiting and Rash  . Zolpidem Tartrate Other (See Comments)    Bad dreams    HOME MEDICATIONS: Outpatient Medications Prior to Visit  Medication Sig  Dispense Refill  . albuterol (ACCUNEB) 1.25 MG/3ML nebulizer solution Take 1 ampule by nebulization daily as needed for wheezing or shortness of breath.     Marland Kitchen albuterol (PROVENTIL,VENTOLIN) 90 MCG/ACT inhaler Inhale 2 puffs into the lungs every 4 (four) hours as needed for wheezing or shortness of breath.    Marland Kitchen amitriptyline (ELAVIL) 10 MG tablet Take 10 mg by mouth at bedtime as needed for sleep.     Marland Kitchen amLODipine (NORVASC) 5 MG tablet Take 5 mg by mouth daily.      Marland Kitchen aspirin EC 81 MG EC tablet Take 1 tablet (81 mg total) by mouth daily.    . benzonatate (TESSALON) 100 MG capsule Take 100 mg by mouth 3 (three) times daily as needed for cough.   1  . calcium carbonate (TUMS - DOSED IN MG ELEMENTAL CALCIUM) 500 MG chewable tablet Chew 2 tablets by mouth daily.    . Cholecalciferol (VITAMIN D3) 2000 units TABS Take 2,000 Units by mouth daily.    . diazepam (VALIUM) 5 MG tablet Take 5 mg by mouth 2 (two) times daily as needed (for back pain or anxiety).     Marland Kitchen escitalopram (LEXAPRO) 20 MG tablet Take 20 mg by mouth daily.     . Fluticasone-Umeclidin-Vilant (TRELEGY ELLIPTA) 100-62.5-25 MCG/INH AEPB Inhale 1 puff into the lungs daily.    Marland Kitchen gabapentin (NEURONTIN) 300 MG capsule Take 300 mg by mouth daily.    Marland Kitchen ibandronate (BONIVA) 3 MG/3ML SOLN injection Inject 3 mLs (3 mg total) into  the vein every 3 (three) months.    . levothyroxine (LEVOXYL) 25 MCG tablet Take 25 mcg by mouth daily.      Marland Kitchen olmesartan (BENICAR) 5 MG tablet Take 5 mg by mouth daily.    Marland Kitchen omeprazole (PRILOSEC) 40 MG capsule Take 40 mg by mouth daily before breakfast.     . pravastatin (PRAVACHOL) 80 MG tablet Take 80 mg by mouth at bedtime.     Marland Kitchen PROAIR HFA 108 (90 Base) MCG/ACT inhaler Inhale 2 puffs into the lungs 2 (two) times daily.     . midodrine (PROAMATINE) 5 MG tablet TAKE 1 TABLET BY MOUTH THREE TIMES A DAY AS NEEDED WHILE AWAKE DO NOT TAKE LATE IN THE EVENING (Patient not taking: Reported on 12/05/2018) 270 tablet 0  .  cyclobenzaprine (FLEXERIL) 5 MG tablet Take 1 tablet (5 mg total) by mouth 3 (three) times daily as needed for muscle spasms. 50 tablet 1  . furosemide (LASIX) 40 MG tablet Take 40 mg by mouth daily as needed for fluid.     Marland Kitchen ibuprofen (ADVIL,MOTRIN) 200 MG tablet Take 600-800 mg by mouth every 6 (six) hours as needed (for pain).    . Investigational - Study Medication Cholesterol study lasting for 5 years/originated from Dr. Katrine Coho office [phone]: Take 2 capsules by mouth daily (are either "corn oil" or "unnamed medication")     No facility-administered medications prior to visit.     PAST MEDICAL HISTORY: Past Medical History:  Diagnosis Date  . Aortic stenosis    Mild AS 02/2016 echo  . COPD (chronic obstructive pulmonary disease) ([place])   . Dyspnea   . GERD (gastroesophageal reflux disease)   . Hematuria   . Hernia, hiatal   . HTN (hypertension)   . Hyperlipidemia   . Hypothyroid   . Migraine   . OA (osteoarthritis)   . OP (osteoporosis)   . Other and unspecified angina pectoris    02/2016 - had non-ischemic stress test  . Pneumonia   . Spondylolisthesis of lumbar region     PAST SURGICAL HISTORY: Past Surgical History:  Procedure Laterality Date  . APPENDECTOMY    . CATARACT EXTRACTION, BILATERAL    . CERVICAL DISC ARTHROPLASTY     x 2  . COLONOSCOPY    . ESOPHAGOGASTRODUODENOSCOPY  07/19/00  . HAND SURGERY Right 2014  . LAMINECTOMY  05/23/2017   L4-5, L5-S1    FAMILY HISTORY: Family History  Problem Relation Age of Onset  . Heart disease Mother   . Heart disease Father   . Heart disease Brother   . Cancer Sister        breast  . Cancer Sister        lung    SOCIAL HISTORY: Social History   Socioeconomic History  . Marital status: Widowed    Spouse name: Not on file  . Number of children: 0  . Years of education: Not on file  . Highest education level: Not on file  Occupational History  . Occupation: retired    Fish farm manager: RETIRED   Social Needs  . Financial resource strain: Not on file  . Food insecurity    Worry: Not on file    Inability: Not on file  . Transportation needs    Medical: Not on file    Non-medical: Not on file  Tobacco Use  . Smoking status: Current Every Day Smoker    Packs/day: 1.00    Years: 45.00    Pack years:  45.00    Types: Cigarettes  . Smokeless tobacco: Never Used  . Tobacco comment: currently smoking 0.5ppd as of 12/05/2018  Substance and Sexual Activity  . Alcohol use: No  . Drug use: No  . Sexual activity: Not on file  Lifestyle  . Physical activity    Days per week: Not on file    Minutes per session: Not on file  . Stress: Not on file  Relationships  . Social Herbalist on phone: Not on file    Gets together: Not on file    Attends religious service: Not on file    Active member of club or organization: Not on file    Attends meetings of clubs or organizations: Not on file    Relationship status: Not on file  . Intimate partner violence    Fear of current or ex partner: Not on file    Emotionally abused: Not on file    Physically abused: Not on file    Forced sexual activity: Not on file  Other Topics Concern  . Not on file  Social History Narrative   12/05/18 Lives with sister   Caffeine- 2-3 cups daily     PHYSICAL EXAM  GENERAL EXAM/CONSTITUTIONAL: Vitals:  Vitals:   12/05/18 1612  BP: (!) 157/70  Pulse: 66  Temp: 97.8 F (36.6 C)  Weight: 157 lb 3.2 oz (71.3 kg)  Height: 5\' 5"  (1.651 m)     Body mass index is 26.16 kg/m. Wt Readings from Last 3 Encounters:  12/05/18 157 lb 3.2 oz (71.3 kg)  12/04/18 155 lb (70.3 kg)  08/16/18 157 lb (71.2 kg)     Patient is in no distress; well developed, nourished and groomed; neck is supple  CARDIOVASCULAR:  Examination of carotid arteries is normal; no carotid bruits  Regular rate and rhythm, no murmurs  Examination of peripheral vascular system by observation and palpation is normal   EYES:  Ophthalmoscopic exam of optic discs and posterior segments is normal; no papilledema or hemorrhages  No exam data present  MUSCULOSKELETAL:  Gait, strength, tone, movements noted in Neurologic exam below  NEUROLOGIC: MENTAL STATUS:  No flowsheet data found.  awake, alert, oriented to person, place and time  recent and remote memory intact  normal attention and concentration  language fluent, comprehension intact, naming intact  fund of knowledge appropriate  CRANIAL NERVE:   2nd - no papilledema on fundoscopic exam  2nd, 3rd, 4th, 6th - pupils equal and reactive to light, visual fields full to confrontation, extraocular muscles intact, no nystagmus  5th - facial sensation symmetric  7th - facial strength symmetric  8th - hearing intact  9th - palate elevates symmetrically, uvula midline  11th - shoulder shrug symmetric  12th - tongue protrusion midline  MOTOR:   normal bulk and tone, full strength in the BUE, BLE; EXCEPT RIGHT THUMB ABDUCTION WEAKNESS; ATROPHY OF BILATERAL THENAR MUSCLES  SENSORY:   normal and symmetric to light touch, temperature, vibration; EXCEPT DECR IN RIGHT ARM ELBOW TO HAND  COORDINATION:   finger-nose-finger, fine finger movements normal  REFLEXES:   deep tendon reflexes TRACE and symmetric  GAIT/STATION:   narrow based gait     DIAGNOSTIC DATA (LABS, IMAGING, TESTING) - I reviewed patient records, labs, notes, testing and imaging myself where available.  Lab Results  Component Value Date   WBC 9.3 12/15/2017   HGB 13.4 12/15/2017   HCT 41.3 12/15/2017   MCV 92.2 12/15/2017  PLT 408 (H) 12/15/2017      Component Value Date/Time   NA 141 12/15/2017 1106   K 4.6 12/15/2017 1106   CL 107 12/15/2017 1106   CO2 27 12/15/2017 1106   GLUCOSE 112 (H) 12/15/2017 1106   BUN 11 12/15/2017 1106   CREATININE 1.20 (H) 12/15/2017 1106   CALCIUM 9.6 12/15/2017 1106   PROT 6.7 02/04/2016 1234   ALBUMIN 3.4 (L)  02/04/2016 1234   AST 14 (L) 02/04/2016 1234   ALT 13 (L) 02/04/2016 1234   ALKPHOS 58 02/04/2016 1234   BILITOT 0.8 02/04/2016 1234   GFRNONAA 42 (L) 12/15/2017 1106   GFRAA 49 (L) 12/15/2017 1106   Lab Results  Component Value Date   CHOL 207 (H) 02/04/2016   CHOL 207 (H) 02/04/2016   HDL 47 02/04/2016   HDL 48 02/04/2016   LDLCALC 138 (H) 02/04/2016   LDLCALC 135 (H) 02/04/2016   TRIG 111 02/04/2016   TRIG 118 02/04/2016   CHOLHDL 4.4 02/04/2016   CHOLHDL 4.3 02/04/2016   No results found for: HGBA1C No results found for: VITAMINB12 Lab Results  Component Value Date   TSH 2.428 02/04/2016       ASSESSMENT AND PLAN  78 y.o. year old female here with new onset right hand and arm numbness and weakness since April 2020, may represent peripheral neuropathy versus cervical radiculopathy.  We will start with EMG nerve conduction study and then consider MRI cervical spine.   Dx: peripheral neuropathy vs cervical radiculopathy  1. Right arm weakness      PLAN:  Orders Placed This Encounter  Procedures  . NCV with EMG(electromyography)   Return for for NCV/EMG.    Penni Bombard, MD 11/07/2421, 5:36 PM Certified in Neurology, Neurophysiology and Neuroimaging  [facility] [address] [place], [place] [address] [phone]

## 2018-12-25 ENCOUNTER — Encounter: Payer: Self-pay | Admitting: Cardiology

## 2018-12-25 ENCOUNTER — Telehealth: Payer: PPO | Admitting: Cardiology

## 2018-12-25 ENCOUNTER — Other Ambulatory Visit: Payer: Self-pay

## 2018-12-25 VITALS — BP 106/55 | HR 99 | Ht 64.0 in | Wt 155.0 lb

## 2018-12-25 DIAGNOSIS — R0600 Dyspnea, unspecified: Secondary | ICD-10-CM

## 2018-12-25 DIAGNOSIS — I6523 Occlusion and stenosis of bilateral carotid arteries: Secondary | ICD-10-CM | POA: Diagnosis not present

## 2018-12-25 DIAGNOSIS — I2510 Atherosclerotic heart disease of native coronary artery without angina pectoris: Secondary | ICD-10-CM | POA: Diagnosis not present

## 2018-12-25 DIAGNOSIS — R0609 Other forms of dyspnea: Secondary | ICD-10-CM

## 2018-12-25 DIAGNOSIS — Z720 Tobacco use: Secondary | ICD-10-CM

## 2018-12-25 DIAGNOSIS — R42 Dizziness and giddiness: Secondary | ICD-10-CM | POA: Diagnosis not present

## 2018-12-25 NOTE — Progress Notes (Signed)
Virtual Visit via Telephone Note: Patient unable to use video assisted device.  This visit type was conducted due to national recommendations for restrictions regarding the COVID-19 Pandemic (e.g. social distancing).  This format is felt to be most appropriate for this patient at this time.  All issues noted in this document were discussed and addressed.  No physical exam was performed.  The patient has consented to conduct a Telehealth visit and understands insurance will be billed.   I connected with@, on 12/25/18 at  by TELEPHONE and verified that I am speaking with the correct person using two identifiers.   I discussed the limitations of evaluation and management by telemedicine and the availability of in person appointments. The patient expressed understanding and agreed to proceed.   I have discussed with patient regarding the safety during COVID Pandemic and steps and precautions to be taken including social distancing, frequent hand wash and use of detergent soap, gels with the patient. I asked the patient to avoid touching mouth, nose, eyes, ears with the hands. I encouraged regular walking around the neighborhood and exercise and regular diet, as long as social distancing can be maintained.  Primary Physician/Referring:  Merrilee Seashore, MD  Patient ID: Joann Barnes, female    DOB: Jun 21, 1940, 78 y.o.   MRN: [id number]  Chief Complaint  Patient presents with  . orthostatics  . Follow-up dizziness and carotid stenosis    6 month   HPI:    Joann Barnes  is a 78 y.o. Caucasian female with hypertension, hyperlipidemia, COPD, hypothyroidism, migraines, GERD, continued tobacco use, coronary calcification noted on the CT scan in December 2019, dizziness and orthostasis, was on furosemide which was discontinued 6 months ago, presents here for follow-up of dyspnea and dizziness and mild asymptomatic carotid stenosis. This is a 79-month virtual visit.  She was having severe dizziness  and also has had syncope in Sept 2019 and felt to be related to orthostasis. She has not had any further syncope.  In fact she has not used any midodrine since last office visit and states that since switching taking amlodipine to the evening dose she has been doing well.  Unfortunately still continues to smoke and realizes that she needs to quit but having a hard time.  Past Medical History:  Diagnosis Date  . Aortic stenosis    Mild AS 02/2016 echo  . COPD (chronic obstructive pulmonary disease) ([place])   . Dyspnea   . GERD (gastroesophageal reflux disease)   . Hematuria   . Hernia, hiatal   . HTN (hypertension)   . Hyperlipidemia   . Hypothyroid   . Migraine   . OA (osteoarthritis)   . OP (osteoporosis)   . Other and unspecified angina pectoris    02/2016 - had non-ischemic stress test  . Pneumonia   . Spondylolisthesis of lumbar region    Past Surgical History:  Procedure Laterality Date  . APPENDECTOMY    . CATARACT EXTRACTION, BILATERAL    . CERVICAL DISC ARTHROPLASTY     x 2  . COLONOSCOPY    . ESOPHAGOGASTRODUODENOSCOPY  07/19/00  . HAND SURGERY Right 2014  . LAMINECTOMY  05/23/2017   L4-5, L5-S1   Social History   Socioeconomic History  . Marital status: Widowed    Spouse name: Not on file  . Number of children: 0  . Years of education: Not on file  . Highest education level: Not on file  Occupational History  . Occupation: retired  Employer: RETIRED  Social Needs  . Financial resource strain: Not on file  . Food insecurity    Worry: Not on file    Inability: Not on file  . Transportation needs    Medical: Not on file    Non-medical: Not on file  Tobacco Use  . Smoking status: Current Every Day Smoker    Packs/day: 1.00    Years: 45.00    Pack years: 45.00    Types: Cigarettes  . Smokeless tobacco: Never Used  . Tobacco comment: currently smoking 0.5ppd as of 12/05/2018  Substance and Sexual Activity  . Alcohol use: No  . Drug use: No  . Sexual  activity: Not on file  Lifestyle  . Physical activity    Days per week: Not on file    Minutes per session: Not on file  . Stress: Not on file  Relationships  . Social Herbalist on phone: Not on file    Gets together: Not on file    Attends religious service: Not on file    Active member of club or organization: Not on file    Attends meetings of clubs or organizations: Not on file    Relationship status: Not on file  . Intimate partner violence    Fear of current or ex partner: Not on file    Emotionally abused: Not on file    Physically abused: Not on file    Forced sexual activity: Not on file  Other Topics Concern  . Not on file  Social History Narrative   12/05/18 Lives with sister   Caffeine- 2-3 cups daily   ROS  Review of Systems  Constitution: Negative for chills, decreased appetite, malaise/fatigue and weight gain.  Cardiovascular: Negative for dyspnea on exertion, leg swelling and syncope.  Respiratory: Positive for shortness of breath (chronic ).   Endocrine: Negative for cold intolerance.  Hematologic/Lymphatic: Does not bruise/bleed easily.  Musculoskeletal: Negative for joint swelling.  Gastrointestinal: Negative for abdominal pain, anorexia, change in bowel habit, hematochezia and melena.  Neurological: Negative for headaches and light-headedness.  Psychiatric/Behavioral: Negative for depression and substance abuse.  All other systems reviewed and are negative.  Objective  Blood pressure (!) 106/55, pulse 99, height 5\' 4"  (1.626 m), weight 155 lb (70.3 kg). Body mass index is 26.61 kg/m.  Physical exam not performed or limited due to virtual visit.  Please see exam details from prior visit is as below.  Physical Exam  Constitutional: She appears well-developed and well-nourished. No distress.  HENT:  Head: Atraumatic.  Eyes: Conjunctivae are normal.  Neck: Neck supple. No JVD present. No thyromegaly present.  Cardiovascular: Normal rate,  regular rhythm, S1 normal, S2 normal and intact distal pulses. Exam reveals no gallop.  Murmur heard.  Midsystolic murmur is present with a grade of 2/6 at the upper right sternal border and apex. Pulses:      Carotid pulses are 2+ on the right side with bruit and 2+ on the left side with bruit.      Femoral pulses are 2+ on the right side and 2+ on the left side.      Popliteal pulses are 2+ on the right side and 2+ on the left side.       Dorsalis pedis pulses are 2+ on the right side and 2+ on the left side.       Posterior tibial pulses are 1+ on the right side and 1+ on the left side.  No  edema.   Pulmonary/Chest: Effort normal and breath sounds normal.  Abdominal: Soft. Bowel sounds are normal.  Musculoskeletal: Normal range of motion.        General: No edema.  Neurological: She is alert.  Skin: Skin is warm and dry.  Psychiatric: She has a normal mood and affect.   Radiology: No results found.  Laboratory examination:   No results for input(s): NA, K, CL, CO2, GLUCOSE, BUN, CREATININE, CALCIUM, GFRNONAA, GFRAA in the last 8760 hours. CMP Latest Ref Rng & Units 12/15/2017 05/24/2017 05/20/2017  Glucose 70 - 99 mg/dL 112(H) 120(H) 109(H)  BUN 8 - 23 mg/dL 11 15 14   Creatinine 0.44 - 1.00 mg/dL 1.20(H) 1.35(H) 1.38(H)  Sodium 135 - 145 mmol/L 141 138 139  Potassium 3.5 - 5.1 mmol/L 4.6 4.0 3.6  Chloride 98 - 111 mmol/L 107 105 105  CO2 22 - 32 mmol/L 27 23 23   Calcium 8.9 - 10.3 mg/dL 9.6 8.0(L) 9.3  Total Protein 6.5 - 8.1 g/dL - - -  Total Bilirubin 0.3 - 1.2 mg/dL - - -  Alkaline Phos 38 - 126 U/L - - -  AST 15 - 41 U/L - - -  ALT 14 - 54 U/L - - -   CBC Latest Ref Rng & Units 12/15/2017 05/24/2017 05/20/2017  WBC 4.0 - 10.5 K/uL 9.3 11.4(H) 10.6(H)  Hemoglobin 12.0 - 15.0 g/dL 13.4 8.3(L) 12.3  Hematocrit 36.0 - 46.0 % 41.3 25.4(L) 37.9  Platelets 150 - 400 K/uL 408(H) 222 351   Lipid Panel     Component Value Date/Time   CHOL 207 (H) 02/04/2016 1234   CHOL 207 (H)  02/04/2016 1234   TRIG 111 02/04/2016 1234   TRIG 118 02/04/2016 1234   HDL 47 02/04/2016 1234   HDL 48 02/04/2016 1234   CHOLHDL 4.4 02/04/2016 1234   CHOLHDL 4.3 02/04/2016 1234   VLDL 22 02/04/2016 1234   VLDL 24 02/04/2016 1234   LDLCALC 138 (H) 02/04/2016 1234   LDLCALC 135 (H) 02/04/2016 1234   HEMOGLOBIN A1C No results found for: HGBA1C, MPG TSH No results for input(s): TSH in the last 8760 hours. Medications   Prior to Admission medications   Medication Sig Start Date End Date Taking? Authorizing Provider  albuterol (ACCUNEB) 1.25 MG/3ML nebulizer solution Take 1 ampule by nebulization daily as needed for wheezing or shortness of breath.     [provider]  albuterol (PROVENTIL,VENTOLIN) 90 MCG/ACT inhaler Inhale 2 puffs into the lungs every 4 (four) hours as needed for wheezing or shortness of breath. 11/16/10   Parrett, Fonnie Mu, NP  amitriptyline (ELAVIL) 10 MG tablet Take 10 mg by mouth at bedtime as needed for sleep.  12/08/10   [provider]  amLODipine (NORVASC) 5 MG tablet Take 5 mg by mouth daily.      [provider]  aspirin EC 81 MG EC tablet Take 1 tablet (81 mg total) by mouth daily. 02/07/16   Geradine Girt, DO  benzonatate (TESSALON) 100 MG capsule Take 100 mg by mouth 3 (three) times daily as needed for cough.  08/29/17   [provider]  calcium carbonate (TUMS - DOSED IN MG ELEMENTAL CALCIUM) 500 MG chewable tablet Chew 2 tablets by mouth daily.    [provider]  Cholecalciferol (VITAMIN D3) 2000 units TABS Take 2,000 Units by mouth daily.    [provider]  diazepam (VALIUM) 5 MG tablet Take 5 mg by mouth 2 (two) times daily as  needed (for back pain or anxiety).     [provider]  escitalopram (LEXAPRO) 20 MG tablet Take 20 mg by mouth daily.  08/15/17   [provider]  Fluticasone-Umeclidin-Vilant (TRELEGY ELLIPTA) 100-62.5-25 MCG/INH AEPB Inhale 1 puff into the lungs daily.     [provider]  gabapentin (NEURONTIN) 300 MG capsule Take 300 mg by mouth daily.    [provider]  ibandronate (BONIVA) 3 MG/3ML SOLN injection Inject 3 mLs (3 mg total) into the vein every 3 (three) months. 11/16/10   Parrett, Fonnie Mu, NP  levothyroxine (LEVOXYL) 25 MCG tablet Take 25 mcg by mouth daily.      [provider]  midodrine (PROAMATINE) 5 MG tablet TAKE 1 TABLET BY MOUTH THREE TIMES A DAY AS NEEDED WHILE AWAKE DO NOT TAKE LATE IN THE EVENING Patient not taking: Reported on 12/05/2018 11/16/18   Adrian Prows, MD  olmesartan (BENICAR) 5 MG tablet Take 5 mg by mouth daily. 08/07/18   [provider]  omeprazole (PRILOSEC) 40 MG capsule Take 40 mg by mouth daily before breakfast.  09/20/17   [provider]  pravastatin (PRAVACHOL) 80 MG tablet Take 80 mg by mouth at bedtime.     [provider]  PROAIR HFA 108 [phone] Base) MCG/ACT inhaler Inhale 2 puffs into the lungs 2 (two) times daily.  08/25/17   [provider]     Current Outpatient Medications  Medication Instructions  . albuterol (ACCUNEB) 1.25 MG/3ML nebulizer solution 1 ampule, Nebulization, Daily PRN  . albuterol (PROVENTIL,VENTOLIN) 90 MCG/ACT inhaler 2 puffs, Inhalation, Every 4 hours PRN  . amitriptyline (ELAVIL) 10 mg, Oral, At bedtime PRN  . amLODipine (NORVASC) 5 mg, Oral, Daily  . aspirin 81 mg, Oral, Daily  . calcium carbonate (TUMS - DOSED IN MG ELEMENTAL CALCIUM) 500 MG chewable tablet 2 tablets, Oral, Daily  . diazepam (VALIUM) 5 mg, Oral, 2 times daily PRN  . escitalopram (LEXAPRO) 20 mg, Oral, Daily  . Fluticasone-Umeclidin-Vilant (TRELEGY ELLIPTA) 100-62.5-25 MCG/INH AEPB 1 puff, Inhalation, Daily  . gabapentin (NEURONTIN) 300 mg, Oral, Daily  . ibandronate (BONIVA) 3 mg, Intravenous, Every 3 months  . levothyroxine (LEVOXYL) 25 mcg, Oral, Daily  . olmesartan (BENICAR) 5 mg, Oral, Daily  . omeprazole (PRILOSEC) 40 mg, Oral, Daily before breakfast  .  pravastatin (PRAVACHOL) 80 mg, Oral, Daily at bedtime  . PROAIR HFA 108 (90 Base) MCG/ACT inhaler 2 puffs, Inhalation, 2 times daily  . Vitamin D3 2,000 Units, Oral, Daily    Cardiac Studies:   CT scan of chest 04/03/2018:  1. No acute findings. Interval clearing of mucoid impaction and ground-glass in the right upper and right lower lobes. 2. Scattered pulmonary nodules measure 4 mm or less size, stable. 3. Aortic atherosclerosis. Three-vessel coronary artery calcification.  Carotid artery duplex 05/17/2018: Stenosis in the bilateral internal carotid artery (16-49%), lower end of spectrum. Antegrade right vertebral artery flow. Antegrade left vertebral artery flow. Follow up in one year is appropriate if clinically indicated.   Lexiscan myoview stress test 04/24/2018: 1. Lexiscan stress test was performed. Exercise capacity was not assessed. Stress symptoms included dyspnea, flushing, dizziness. Resting blood pressure 184/80 mmHg, peak effect blood pressure 140/70 mmHg. The resting and stress electrocardiogram demonstrated normal sinus rhythm, normal resting conduction, no resting arrhythmias, normal rest repolarization, and poor R wave progression. Stress EKG is non diagnostic for ischemia as it is a pharmacologic stress. 2. The overall quality of the study is excellent. There is no  evidence of abnormal lung activity. Stress and rest SPECT images demonstrate homogeneous tracer distribution throughout the myocardium. Gated SPECT imaging reveals normal myocardial thickening and wall motion. The left ventricular ejection fraction was normal (68%). 3. Low risk study.  Echocardiogram 08/05/2017: Normal LV systolic function, EF 123456, normal diastolic function. Mild aortic stenosis, moderate aortic regurgitation. Aortic valve area 1.11 cm.  Mild MR, mild TR, PA pressure 31 mmHg.  Moderate left atrial enlargement, a PFO cannot be excluded.  Abdominal aortic duplex 05/17/2018: The maximum  aorta diameter is 2.63 cm (dist). Diffuse plaque observed in the distal aorta. Peak systolic velocities in the left internal iliac artery are mildly increased to 175.54 cm/s. suggestive of <50% stenosis. No AAA noted.  Assessment     ICD-10-CM   1. Dyspnea on exertion  R06.09   2. Dizziness  R42   3. Coronary artery calcification seen on CAT scan  I25.10   4. Tobacco use  Z72.0   5. Asymptomatic bilateral carotid artery stenosis  I65.23 PCV CAROTID DUPLEX (BILATERAL)    EKG 04/19/2018: Normal sinus rhythm at rate of 67 bpm, left atrial enlargement, leftward axis. No evidence of ischemia.  Recommendations:   Patient with mild orthostatic hypotension, dizziness, chronic dyspnea related to underlying mild COPD and ongoing tobacco use disorder, this is a 31-month office visit.  Since she has been taking amlodipine in the evening and furosemide was discontinued, she has not had any further episodes of dizziness in fact has discontinued taking midodrine.  She remained stable without syncope.  Smoking cessation again discussed with the patient.  With regard to carotid artery stenosis, she has very mild stenosis, she has been scheduled for surveillance Dopplers 6 months from now, unless this is revealing significant progression, continued primary/secondary prevention is indicated.  I will see her back on a as needed basis unless I see progression of carotid disease.  Adrian Prows, MD, [facility] 12/25/2018, 3:02 PM [facility] [place] Pager: [phone] Office: [phone] If no answer Cell [phone]

## 2019-01-04 ENCOUNTER — Other Ambulatory Visit: Payer: Self-pay

## 2019-01-04 ENCOUNTER — Ambulatory Visit (INDEPENDENT_AMBULATORY_CARE_PROVIDER_SITE_OTHER): Payer: PPO | Admitting: Diagnostic Neuroimaging

## 2019-01-04 DIAGNOSIS — Z0289 Encounter for other administrative examinations: Secondary | ICD-10-CM

## 2019-01-04 DIAGNOSIS — M5412 Radiculopathy, cervical region: Secondary | ICD-10-CM

## 2019-01-04 DIAGNOSIS — R29898 Other symptoms and signs involving the musculoskeletal system: Secondary | ICD-10-CM

## 2019-01-04 DIAGNOSIS — L821 Other seborrheic keratosis: Secondary | ICD-10-CM | POA: Diagnosis not present

## 2019-01-04 DIAGNOSIS — L570 Actinic keratosis: Secondary | ICD-10-CM | POA: Diagnosis not present

## 2019-01-04 DIAGNOSIS — C44729 Squamous cell carcinoma of skin of left lower limb, including hip: Secondary | ICD-10-CM | POA: Diagnosis not present

## 2019-01-10 ENCOUNTER — Telehealth: Payer: Self-pay | Admitting: Diagnostic Neuroimaging

## 2019-01-10 NOTE — Procedures (Signed)
[facility]  NCS (NERVE CONDUCTION STUDY) WITH EMG (ELECTROMYOGRAPHY) REPORT   STUDY DATE: 01/04/19 PATIENT NAME: Joann Barnes DOB: 01-Feb-1941 MRN: [id number]  ORDERING CLINICIAN: 01/10/19  TECHNOLOGIST: Sherre Scarlet ELECTROMYOGRAPHER: Earlean Polka. Fender Herder, MD  CLINICAL INFORMATION: 78 year old female with right arm weakness.  History of multiple neck surgeries.  FINDINGS: NERVE CONDUCTION STUDY: Right median motor response is prolonged distal latency, decreased amplitude, normal conduction velocity.  Left median motor response has normal distal latency, decreased amplitude, normal conduction velocity.  Right ulnar motor response has prolonged distal latency, decreased amplitude and normal conduction velocity.    Left ulnar motor response is normal.  Bilateral median and ulnar sensory responses are normal.    NEEDLE ELECTROMYOGRAPHY:  Needle examination of right upper extremity deltoid, biceps, triceps, flexor carpi radialis and first dorsal interosseous is normal except for positive sharp waves in the right first dorsal interosseous muscle.     IMPRESSION:   Abnormal study demonstrating: - Bilateral median and right ulnar motor responses have decreased amplitudes with normal sensory responses.  May be related to underlying cervical radiculopathies versus motor neuropathies.    INTERPRETING PHYSICIAN:  Penni Bombard, MD Certified in Neurology, Neurophysiology and Neuroimaging  [facility] [address] [place], [place] [address] [phone]  [facility]    Nerve / Sites Muscle Latency Ref. Amplitude Ref. Rel Amp Segments Distance Velocity Ref. Area    ms ms mV mV %  cm m/s m/s mVms  R Median - APB     Wrist APB 5.1 ?4.4 1.0 ?4.0 100 Wrist - APB 7   6.3     Upper arm APB 9.4  0.2  18.9 Upper arm - Wrist 22 51 ?49 1.3  L Median - APB     Wrist APB 3.6 ?4.4 2.8 ?4.0 100 Wrist - APB 7   7.1     Upper arm APB 8.2  2.7   95.2 Upper arm - Wrist 22 49 ?49 7.3  R Ulnar - ADM     Wrist ADM 3.8 ?3.3 3.5 ?6.0 100 Wrist - ADM 7   8.9     B.Elbow ADM 7.4  2.8  79.5 B.Elbow - Wrist 19 53 ?49 8.7     A.Elbow ADM 9.3  2.8  101 A.Elbow - B.Elbow 10 52 ?49 8.6         A.Elbow - Wrist      L Ulnar - ADM     Wrist ADM 2.9 ?3.3 9.7 ?6.0 100 Wrist - ADM 7   24.7     B.Elbow ADM 6.4  7.8  80 B.Elbow - Wrist 19 54 ?49 23.1     A.Elbow ADM 8.3  7.9  102 A.Elbow - B.Elbow 10 53 ?49 22.8         A.Elbow - Wrist                 SNC    Nerve / Sites Rec. Site Peak Lat Ref.  Amp Ref. Segments Distance    ms ms V V  cm  R Median - Orthodromic (Dig II, Mid palm)     Dig II Wrist 3.2 ?3.4 20 ?10 Dig II - Wrist 13  L Median - Orthodromic (Dig II, Mid palm)     Dig II Wrist 3.1 ?3.4 21 ?10 Dig II - Wrist 13  R Ulnar - Orthodromic, (Dig V, Mid palm)     Dig V Wrist 2.9 ?3.1 15 ?5  Dig V - Wrist 11  L Ulnar - Orthodromic, (Dig V, Mid palm)     Dig V Wrist 2.7 ?3.1 10 ?5 Dig V - Wrist 64              F  Wave    Nerve F Lat Ref.   ms ms  R Ulnar - ADM 33.5 ?32.0  L Ulnar - ADM 27.9 ?32.0         EMG full       EMG Summary Table    Spontaneous MUAP Recruitment  Muscle IA Fib PSW Fasc Other Amp Dur. Poly Pattern  R. Deltoid Normal None None None _______ Normal Normal Normal Normal  R. Biceps brachii Normal None None None _______ Normal Normal Normal Normal  R. Triceps brachii Normal None None None _______ Normal Normal Normal Normal  R. Flexor carpi radialis Normal None None None _______ Normal Normal Normal Normal  R. First dorsal interosseous Normal None 1+ None _______ Normal Normal Normal Normal

## 2019-01-10 NOTE — Telephone Encounter (Signed)
Health team order sent to GI. No auth they will reach out to the patient to schedule.  

## 2019-01-16 DIAGNOSIS — L080 Pyoderma: Secondary | ICD-10-CM | POA: Diagnosis not present

## 2019-01-28 ENCOUNTER — Other Ambulatory Visit: Payer: Self-pay

## 2019-01-28 ENCOUNTER — Ambulatory Visit
Admission: RE | Admit: 2019-01-28 | Discharge: 2019-01-28 | Disposition: A | Discharge status: Home / Self Care | Payer: PPO | Source: Ambulatory Visit | Attending: Diagnostic Neuroimaging | Admitting: Diagnostic Neuroimaging

## 2019-01-28 DIAGNOSIS — M5412 Radiculopathy, cervical region: Secondary | ICD-10-CM

## 2019-01-28 MED ORDER — GADOBENATE DIMEGLUMINE 529 MG/ML IV SOLN
6.0000 mL | Freq: Once | INTRAVENOUS | Status: AC | PRN
  Administered 2019-01-28: 6 mL via INTRAVENOUS

## 2019-02-02 DIAGNOSIS — Z23 Encounter for immunization: Secondary | ICD-10-CM | POA: Diagnosis not present

## 2019-02-06 DIAGNOSIS — Z803 Family history of malignant neoplasm of breast: Secondary | ICD-10-CM | POA: Diagnosis not present

## 2019-02-06 DIAGNOSIS — Z1231 Encounter for screening mammogram for malignant neoplasm of breast: Secondary | ICD-10-CM | POA: Diagnosis not present

## 2019-02-12 ENCOUNTER — Telehealth: Payer: Self-pay | Admitting: Diagnostic Neuroimaging

## 2019-02-12 DIAGNOSIS — C44729 Squamous cell carcinoma of skin of left lower limb, including hip: Secondary | ICD-10-CM | POA: Diagnosis not present

## 2019-02-12 NOTE — Telephone Encounter (Signed)
Pt called and LVM to see if her MRI results have come in yet. Please advise.

## 2019-02-13 ENCOUNTER — Telehealth: Payer: Self-pay | Admitting: *Deleted

## 2019-02-13 ENCOUNTER — Encounter: Payer: Self-pay | Admitting: *Deleted

## 2019-02-13 NOTE — Telephone Encounter (Signed)
Called patient and informed her MRI cervical spine showed multi-level degenerative / arthritis changes and pinched nerves in her neck. She may consider spine surgery evaluation or pain mnangement evaluation. She stated she is "working with Dr Arnoldo Morale" who did her back surgery, seeing him in Dec for post op laminectomy check up. She stated she will discuss this with him and call him for a sooner appointment if her hand gets worse. She stated that she just had a skin cancer removed from her leg, has stitches and so doesn't want to pursue anything else right away. I advised if Dr Arnoldo Morale needs any records, she can sign a release to have them faxed to him.   She  verbalized understanding, appreciation.

## 2019-02-15 DIAGNOSIS — D225 Melanocytic nevi of trunk: Secondary | ICD-10-CM | POA: Diagnosis not present

## 2019-02-15 DIAGNOSIS — Z85828 Personal history of other malignant neoplasm of skin: Secondary | ICD-10-CM | POA: Diagnosis not present

## 2019-02-15 DIAGNOSIS — L821 Other seborrheic keratosis: Secondary | ICD-10-CM | POA: Diagnosis not present

## 2019-02-15 DIAGNOSIS — D1801 Hemangioma of skin and subcutaneous tissue: Secondary | ICD-10-CM | POA: Diagnosis not present

## 2019-02-15 DIAGNOSIS — L820 Inflamed seborrheic keratosis: Secondary | ICD-10-CM | POA: Diagnosis not present

## 2019-02-15 DIAGNOSIS — L814 Other melanin hyperpigmentation: Secondary | ICD-10-CM | POA: Diagnosis not present

## 2019-02-15 DIAGNOSIS — L570 Actinic keratosis: Secondary | ICD-10-CM | POA: Diagnosis not present

## 2019-02-23 DIAGNOSIS — M4319 Spondylolisthesis, multiple sites in spine: Secondary | ICD-10-CM | POA: Diagnosis not present

## 2019-02-23 DIAGNOSIS — R29898 Other symptoms and signs involving the musculoskeletal system: Secondary | ICD-10-CM | POA: Diagnosis not present

## 2019-02-23 DIAGNOSIS — M4312 Spondylolisthesis, cervical region: Secondary | ICD-10-CM | POA: Diagnosis not present

## 2019-02-23 DIAGNOSIS — I10 Essential (primary) hypertension: Secondary | ICD-10-CM | POA: Diagnosis not present

## 2019-03-14 ENCOUNTER — Other Ambulatory Visit: Payer: Self-pay

## 2019-03-14 ENCOUNTER — Encounter: Payer: Self-pay | Admitting: Orthopaedic Surgery

## 2019-03-14 ENCOUNTER — Ambulatory Visit: Payer: PPO | Admitting: Orthopaedic Surgery

## 2019-03-14 DIAGNOSIS — M7061 Trochanteric bursitis, right hip: Secondary | ICD-10-CM | POA: Diagnosis not present

## 2019-03-14 DIAGNOSIS — Z981 Arthrodesis status: Secondary | ICD-10-CM

## 2019-03-14 NOTE — Progress Notes (Signed)
Office Visit Note   Patient: Joann Barnes           Date of Birth: 04-Dec-1940           MRN: [id number] Visit Date: 03/14/2019              Requested by: Merrilee Seashore, [facility] [address] [place],  [place] [address] PCP: Merrilee Seashore, MD   Assessment & Plan: Visit Diagnoses:  1. History of lumbar spinal fusion   2. Trochanteric bursitis, right hip     Plan: Trochanteric injection performed right hip.  She tolerated it well will return or call if she has persistent problems.  Follow-Up Instructions: No follow-ups on file.   Orders:  No orders of the defined types were placed in this encounter.  No orders of the defined types were placed in this encounter.     Procedures: Large Joint Inj: R greater trochanter on 03/14/2019 10:20 AM Details: lateral approach Medications: 0.5 mL lidocaine 1 %; 2 mL bupivacaine 0.25 %; 40 mg methylPREDNISolone acetate 40 MG/ML      Clinical Data: No additional findings.   Subjective: Chief Complaint  Patient presents with  . Right Hip - Pain    HPI 78 year old female returns with recurrent right trochanteric bursitis.  Previous injection 08/16/2018 lasted for 6 months.  She has had recurrence in the last month with pain gradually increasing difficulty walking and states she would like to repeat the injection today.  Review of Systems 14 point review of systems is unchanged from 08/16/2018 office visit other than as mentioned HPI.  Objective: Vital Signs: BP 140/62   Pulse 74   Ht 5\' 5"  (1.651 m)   Wt 155 lb (70.3 kg)   BMI 25.79 kg/m   Physical Exam Constitutional:      Appearance: She is well-developed.  HENT:     Head: Normocephalic.     Right Ear: External ear normal.     Left Ear: External ear normal.  Eyes:     Pupils: Pupils are equal, round, and reactive to light.  Neck:     Thyroid: No thyromegaly.     Trachea: No tracheal deviation.  Cardiovascular:     Rate and Rhythm: Normal rate.   Pulmonary:     Effort: Pulmonary effort is normal.  Abdominal:     Palpations: Abdomen is soft.  Skin:    General: Skin is warm and dry.  Neurological:     Mental Status: She is alert and oriented to person, place, and time.  Psychiatric:        Behavior: Behavior normal.     Ortho Exam there is no exquisite tenderness over the trochanter.  Good hip range of motion.  Well-healed lumbar incision from two-level lumbar fusion.  Specialty Comments:  No specialty comments available.  Imaging: No results found.   PMFS History: Patient Active Problem List   Diagnosis Date Noted  . History of lumbar spinal fusion 03/14/2019  . Trochanteric bursitis, right hip 03/14/2019  . Spondylolisthesis of lumbar region 05/23/2017  . Acute kidney injury superimposed on chronic kidney disease ([place]) 02/04/2016  . Chest pain with moderate risk of acute coronary syndrome 02/04/2016  . Tobacco use 02/04/2016  . Hypokalemia 02/04/2016  . Murmur, cardiac-suspect this is AS 02/04/2016  . Hyperlipidemia   . GERD (gastroesophageal reflux disease)   . HTN (hypertension)   . Hypothyroid   . Chest pain   . COPD (chronic obstructive pulmonary disease) ([place]) 11/16/2010  .  Dyspnea 10/22/2010  . Smoking 10/22/2010   Past Medical History:  Diagnosis Date  . Aortic stenosis    Mild AS 02/2016 echo  . Cancer ([place])    skin  . COPD (chronic obstructive pulmonary disease) ([place])   . Dyspnea   . GERD (gastroesophageal reflux disease)   . Hematuria   . Hernia, hiatal   . HTN (hypertension)   . Hyperlipidemia   . Hypothyroid   . Migraine   . OA (osteoarthritis)   . OP (osteoporosis)   . Other and unspecified angina pectoris    02/2016 - had non-ischemic stress test  . Pneumonia   . Spondylolisthesis of lumbar region     Family History  Problem Relation Age of Onset  . Heart disease Mother   . Heart disease Father   . Heart disease Brother   . Cancer Sister        breast  . Cancer Sister         lung    Past Surgical History:  Procedure Laterality Date  . APPENDECTOMY    . CATARACT EXTRACTION, BILATERAL    . CERVICAL DISC ARTHROPLASTY     x 2  . COLONOSCOPY    . ESOPHAGOGASTRODUODENOSCOPY  07/19/00  . HAND SURGERY Right 2014  . LAMINECTOMY  05/23/2017   L4-5, L5-S1   Social History   Occupational History  . Occupation: retired    Fish farm manager: RETIRED  Tobacco Use  . Smoking status: Current Every Day Smoker    Packs/day: 1.00    Years: 45.00    Pack years: 45.00    Types: Cigarettes  . Smokeless tobacco: Never Used  . Tobacco comment: currently smoking 0.5ppd as of 12/05/2018  Substance and Sexual Activity  . Alcohol use: No  . Drug use: No  . Sexual activity: Not on file

## 2019-03-19 MED ORDER — BUPIVACAINE HCL 0.25 % IJ SOLN
2.0000 mL | INTRAMUSCULAR | Status: AC | PRN
  Administered 2019-03-14: 10:00:00 2 mL via INTRA_ARTICULAR

## 2019-03-19 MED ORDER — METHYLPREDNISOLONE ACETATE 40 MG/ML IJ SUSP
40.0000 mg | INTRAMUSCULAR | Status: AC | PRN
  Administered 2019-03-14: 10:00:00 40 mg via INTRA_ARTICULAR

## 2019-03-19 MED ORDER — LIDOCAINE HCL 1 % IJ SOLN
0.5000 mL | INTRAMUSCULAR | Status: AC | PRN
  Administered 2019-03-14: .5 mL

## 2019-04-02 DIAGNOSIS — Z85828 Personal history of other malignant neoplasm of skin: Secondary | ICD-10-CM | POA: Diagnosis not present

## 2019-04-02 DIAGNOSIS — C44622 Squamous cell carcinoma of skin of right upper limb, including shoulder: Secondary | ICD-10-CM | POA: Diagnosis not present

## 2019-04-02 DIAGNOSIS — L570 Actinic keratosis: Secondary | ICD-10-CM | POA: Diagnosis not present

## 2019-04-02 DIAGNOSIS — L0390 Cellulitis, unspecified: Secondary | ICD-10-CM | POA: Diagnosis not present

## 2019-04-03 DIAGNOSIS — I10 Essential (primary) hypertension: Secondary | ICD-10-CM | POA: Diagnosis not present

## 2019-04-06 ENCOUNTER — Ambulatory Visit (INDEPENDENT_AMBULATORY_CARE_PROVIDER_SITE_OTHER)
Admission: RE | Admit: 2019-04-06 | Discharge: 2019-04-06 | Disposition: A | Discharge status: Home / Self Care | Payer: PPO | Source: Ambulatory Visit | Attending: Internal Medicine | Admitting: Internal Medicine

## 2019-04-06 ENCOUNTER — Other Ambulatory Visit: Payer: Self-pay

## 2019-04-06 DIAGNOSIS — R059 Cough, unspecified: Secondary | ICD-10-CM

## 2019-04-06 DIAGNOSIS — R05 Cough: Secondary | ICD-10-CM

## 2019-04-06 DIAGNOSIS — J439 Emphysema, unspecified: Secondary | ICD-10-CM | POA: Diagnosis not present

## 2019-04-08 DIAGNOSIS — R918 Other nonspecific abnormal finding of lung field: Secondary | ICD-10-CM | POA: Insufficient documentation

## 2019-04-08 NOTE — Progress Notes (Signed)
Virtual Visit via Telephone Note  I connected with Joann Barnes on 04/09/19 at 10:00 AM EST by telephone and verified that I am speaking with the correct person using two identifiers.  Location: Patient: Home Provider: [facility] - [id number], [place], [place], [place] [address]   I discussed the limitations, risks, security and privacy concerns of performing an evaluation and management service by telephone and the availability of in person appointments. I also discussed with the patient that there may be a patient responsible charge related to this service. The patient expressed understanding and agreed to proceed.  Patient consented to consult via telephone: Yes People present and their role in pt care: Pt    History of Present Illness:  78 year old female current everyday smoker followed in our office for emphysema and pulmonary nodule  Past medical history: Hyperlipidemia, GERD, hypertension, hypokalemia, hypothyroidism, murmur Smoking history: Current everyday smoker.  1 pack/day.  45-pack-year smoking history Maintenance: Trelegy Ellipta Patient of Dr. Chase Caller  Chief complaint: 1 year follow up   78 year old female current every day smoker followed in our office for COPD/emphysema.  Patient was last seen in our office in December/2019.  Patient completing 1 year follow-up telephonically today in light of the COVID-19 pandemic.  At last office visit in December/2019 patient was referred to cardiology Dr. Einar Gip due to coronary artery calcification on CT scan.  She also had a 4 mm lung nodule and was instructed to repeat CT in December/2020.  She was also encouraged to stop smoking.  Patient continues to follow-up with cardiologist Dr. Einar Gip.  She continues to smoke unfortunately.  She did complete a recent CT chest on 04/06/2019 that showed centrilobular emphysema as well stable subpleural nodular densities measuring up to 3 mm.  She continues to be maintained on  Trelegy Ellipta.  CBC with differential in the chart from 2017 shows mild peripheral eosinophilia with an eosinophil count of 200.  Patient reports that she was smoking about 1 pack/day and this had increased during the COVID-19 pandemic.  She did run out of cigarettes yesterday and she is trying to stop.  She has tried multiple other therapies to stop smoking before and she has been unsuccessful.  She continues to use her Trelegy Ellipta daily as well as her rescue inhaler 1 time daily.  Smoking assessment and cessation counseling  Patient currently smoking: stopped smoking 04/08/2019 I have advised the patient to quit/stop smoking as soon as possible due to high risk for multiple medical problems.  It will also be very difficult for [place] to manage patient's  respiratory symptoms and status if we continue to expose her lungs to a known irritant.  We do not advise e-cigarettes as a form of stopping smoking.  Patient is willing to quit smoking.  Previous strategies:  Patches broke out skin  NRT Gum - didn't feel well  chantix affected breathing  wellbutrin - had a reaction, but unsure what it was  Attempted hypnotherapy - didn't last   I have advised the patient that we can assist and have options of nicotine replacement therapy, provided smoking cessation education today, provided smoking cessation counseling, and provided cessation resources.  Follow-up next office visit office visit for assessment of smoking cessation.  Smoking cessation counseling advised for: 12 min   Observations/Objective:  02/24/2011-alpha-1 antitrypsin-phenotype PI*MM  04/06/2019-CT chest without contrast-biapical pleural-parenchymal scarring, centrilobular emphysema, scattered subpleural nodular densities measure up to 3 mm in the right lower lobe, unchanged and considered  benign, aortic arthrosclerosis  10/10/2017-pulmonary function test-FVC 1.63 (63% predicted), postbronchodilator ratio 73, postbronchodilator FEV1  1.49 (77% predicted), positive bronchodilator response  08/05/2017-echocardiogram-LV ejection fraction 60 to 65%, right ventricle cavity size, wall thickness and systolic function normal, PA pressure 31  Social History   Tobacco Use  Smoking Status Current Every Day Smoker  . Packs/day: 1.00  . Years: 45.00  . Pack years: 45.00  . Types: Cigarettes  Smokeless Tobacco Never Used  Tobacco Comment   currently smoking 0.5ppd as of 12/05/2018   Immunization History  Administered Date(s) Administered  . Influenza Whole 01/31/2010, 01/08/2011  . Influenza, High Dose Seasonal PF 05/03/2016, 02/02/2018  . Influenza-Unspecified 12/06/2015  . Pneumococcal Conjugate-13 01/24/2018  . Zoster Recombinat (Shingrix) 02/14/2018   Received flu vaccine this year at PCP  Seeing PCP tomm to ask about pneumonia   Assessment and Plan:  COPD with +BD  Plan:  Continue Trelegy Ellipta  We recommend stopping smoking  Schedule patient for pharmacy appt for smoking cessation  Will request records for pneumonia vaccines from pcp  Increase activity daily   Tobacco use Plan:  Please stop smoking  Pharmacy appt for smoking cessation  Follow up in 6 weeks with MR   Abnormal findings on diagnostic imaging of lung Plan:  Please stop smoking  Follow-up in 6 weeks   Healthcare maintenance Plan: Please check with primary care regarding your pneumonia vaccine status We will obtain vaccine records from primary care   Follow Up Instructions:  Return in about 6 weeks (around 05/21/2019), or if symptoms worsen or fail to improve, for Follow up with Dr. Purnell Shoemaker, Follow up with Wyn Quaker FNP-C, clinical pharmacy team appt.   I discussed the assessment and treatment plan with the patient. The patient was provided an opportunity to ask questions and all were answered. The patient agreed with the plan and demonstrated an understanding of the instructions.   The patient was advised to call back or seek an  in-person evaluation if the symptoms worsen or if the condition fails to improve as anticipated.  I provided 30 minutes of non-face-to-face time during this encounter.   Lauraine Rinne, NP

## 2019-04-09 ENCOUNTER — Other Ambulatory Visit: Payer: Self-pay

## 2019-04-09 ENCOUNTER — Encounter: Payer: Self-pay | Admitting: Pulmonary Disease

## 2019-04-09 ENCOUNTER — Ambulatory Visit (INDEPENDENT_AMBULATORY_CARE_PROVIDER_SITE_OTHER): Payer: PPO | Admitting: Pulmonary Disease

## 2019-04-09 DIAGNOSIS — R918 Other nonspecific abnormal finding of lung field: Secondary | ICD-10-CM

## 2019-04-09 DIAGNOSIS — J432 Centrilobular emphysema: Secondary | ICD-10-CM | POA: Diagnosis not present

## 2019-04-09 DIAGNOSIS — Z720 Tobacco use: Secondary | ICD-10-CM

## 2019-04-09 DIAGNOSIS — C44622 Squamous cell carcinoma of skin of right upper limb, including shoulder: Secondary | ICD-10-CM | POA: Diagnosis not present

## 2019-04-09 DIAGNOSIS — F17210 Nicotine dependence, cigarettes, uncomplicated: Secondary | ICD-10-CM | POA: Diagnosis not present

## 2019-04-09 DIAGNOSIS — Z0000 Encounter for general adult medical examination without abnormal findings: Secondary | ICD-10-CM

## 2019-04-09 NOTE — Assessment & Plan Note (Addendum)
Plan:  Continue Trelegy Ellipta  We recommend stopping smoking  Schedule patient for pharmacy appt for smoking cessation  Will request records for pneumonia vaccines from pcp  Increase activity daily

## 2019-04-09 NOTE — Assessment & Plan Note (Signed)
Plan: Please check with primary care regarding your pneumonia vaccine status We will obtain vaccine records from primary care

## 2019-04-09 NOTE — Patient Instructions (Addendum)
You were seen today by Lauraine Rinne, NP  for:   1. Centrilobular emphysema (HCC)  Trelegy Ellipta  >>> 1 puff daily in the morning >>>rinse mouth out after use  >>> This inhaler contains 3 medications that help manage her respiratory status, contact our office if you cannot afford this medication or unable to remain on this medication  Only use your albuterol as a rescue medication to be used if you can't catch your breath by resting or doing a relaxed purse lip breathing pattern.  - The less you use it, the better it will work when you need it. - Ok to use up to 2 puffs  every 4 hours if you must but call for immediate appointment if use goes up over your usual need - Don't leave home without it !!  (think of it like the spare tire for your car)   Note your daily symptoms > remember "red flags" for COPD:   >>>Increase in cough >>>increase in sputum production >>>increase in shortness of breath or activity  intolerance.   If you notice these symptoms, please call the office to be seen.   Please check with your primary care provider to see if you have received any other pneumonia vaccines we will also request these records  2. Tobacco use  We recommend that you stop smoking.  >>>You need to set a quit date >>>If you have friends or family who smoke, let them know you are trying to quit and not to smoke around you or in your living environment  Smoking Cessation Resources:  1 800 QUIT NOW  >>> Patient to call this resource and utilize it to help support her quit smoking >>> Keep up your hard work with stopping smoking  You can also contact the [facility] >>>For smoking cessation classes call [phone]  We do not recommend using e-cigarettes as a form of stopping smoking  You can sign up for smoking cessation support texts and information:  >>>https://smokefree.gov/smokefreetxt  What are the benefits of quitting? There are many health benefits of quitting  smoking. Here are some of them:  Within days of quitting smoking, your risk of having a heart attack decreases, your blood flow improves, and your lung capacity improves. Blood pressure, pulse rate, and breathing patterns start returning to normal soon after quitting.  Within months, your lungs may clear up completely.  Quitting for 10 years reduces your risk of developing lung cancer and heart disease to almost that of a nonsmoker.  People who quit may see an improvement in their overall quality of life.  3. Abnormal findings on diagnostic imaging of lung  Most recent CT completed earlier this month shows stable pulmonary nodules that are not growing and considered benign.  This is good news  Please work on stopping smoking    Follow Up:    Return in about 6 weeks (around 05/21/2019), or if symptoms worsen or fail to improve, for Follow up with Dr. Purnell Shoemaker, Follow up with Wyn Quaker FNP-C, clinical pharmacy team appt.  Please present to our office in 1 weeks for an appointment with the clinical pharmacy team for:  . Smoking cessation   Please do your part to reduce the spread of COVID-19:      Reduce your risk of any infection  and COVID19 by using the similar precautions used for avoiding the common cold or flu:  Marland Kitchen Wash your hands often with soap and warm water for at least 20  seconds.  If soap and water are not readily available, use an alcohol-based hand sanitizer with at least 60% alcohol.  . If coughing or sneezing, cover your mouth and nose by coughing or sneezing into the elbow areas of your shirt or coat, into a tissue or into your sleeve (not your hands). Langley Gauss A MASK when in public  . Avoid shaking hands with others and consider head nods or verbal greetings only. . Avoid touching your eyes, nose, or mouth with unwashed hands.  . Avoid close contact with people who are sick. . Avoid places or events with large numbers of people in one location, like concerts or  sporting events. . If you have some symptoms but not all symptoms, continue to monitor at home and seek medical attention if your symptoms worsen. . If you are having a medical emergency, call 911.   [place] / e-Visit: [email or web address]         [facility]: [facility]: [id number]                   [facility]: [id number]     It is flu season:   >>> Best ways to protect herself from the flu: Receive the yearly flu vaccine, practice good hand hygiene washing with soap and also using hand sanitizer when available, eat a nutritious meals, get adequate rest, hydrate appropriately   Please contact the office if your symptoms worsen or you have concerns that you are not improving.   Thank you for choosing [facility] for your healthcare, and for allowing [place] to partner with you on your healthcare journey. I am thankful to be able to provide care to you today.   Wyn Quaker FNP-C    Steps to Quit Smoking Smoking tobacco is the leading cause of preventable death. It can affect almost every organ in the body. Smoking puts you and people around you at risk for many serious, long-lasting (chronic) diseases. Quitting smoking can be hard, but it is one of the best things that you can do for your health. It is never too late to quit. How do I get ready to quit? When you decide to quit smoking, make a plan to help you succeed. Before you quit:  Pick a date to quit. Set a date within the next 2 weeks to give you time to prepare.  Write down the reasons why you are quitting. Keep this list in places where you will see it often.  Tell your family, friends, and co-workers that you are quitting. Their support is important.  Talk with your doctor about the choices that may help you quit.  Find out if your health insurance will  pay for these treatments.  Know the people, places, things, and activities that make you want to smoke (triggers). Avoid them. What first steps can I take to quit smoking?  Throw away all cigarettes at home, at work, and in your car.  Throw away the things that you use when you smoke, such as ashtrays and lighters.  Clean your car. Make sure to empty the ashtray.  Clean your home, including curtains and carpets. What can I do to help me quit smoking? Talk with your doctor about taking medicines and seeing a counselor at the same time. You are more likely to succeed when you do both.  If you are pregnant or breastfeeding, talk with your  doctor about counseling or other ways to quit smoking. Do not take medicine to help you quit smoking unless your doctor tells you to do so. To quit smoking: Quit right away  Quit smoking totally, instead of slowly cutting back on how much you smoke over a period of time.  Go to counseling. You are more likely to quit if you go to counseling sessions regularly. Take medicine You may take medicines to help you quit. Some medicines need a prescription, and some you can buy over-the-counter. Some medicines may contain a drug called nicotine to replace the nicotine in cigarettes. Medicines may:  Help you to stop having the desire to smoke (cravings).  Help to stop the problems that come when you stop smoking (withdrawal symptoms). Your doctor may ask you to use:  Nicotine patches, gum, or lozenges.  Nicotine inhalers or sprays.  Non-nicotine medicine that is taken by mouth. Find resources Find resources and other ways to help you quit smoking and remain smoke-free after you quit. These resources are most helpful when you use them often. They include:  Online chats with a Social worker.  Phone quitlines.  Printed Furniture conservator/restorer.  Support groups or group counseling.  Text messaging programs.  Mobile phone apps. Use apps on your mobile phone or  tablet that can help you stick to your quit plan. There are many free apps for mobile phones and tablets as well as websites. Examples include Quit Guide from the [employer] and smokefree.gov  What things can I do to make it easier to quit?   Talk to your family and friends. Ask them to support and encourage you.  Call a phone quitline (1-800-QUIT-NOW), reach out to support groups, or work with a Social worker.  Ask people who smoke to not smoke around you.  Avoid places that make you want to smoke, such as: ? Bars. ? Parties. ? Smoke-break areas at work.  Spend time with people who do not smoke.  Lower the stress in your life. Stress can make you want to smoke. Try these things to help your stress: ? Getting regular exercise. ? Doing deep-breathing exercises. ? Doing yoga. ? Meditating. ? Doing a body scan. To do this, close your eyes, focus on one area of your body at a time from head to toe. Notice which parts of your body are tense. Try to relax the muscles in those areas. How will I feel when I quit smoking? Day 1 to 3 weeks Within the first 24 hours, you may start to have some problems that come from quitting tobacco. These problems are very bad 2-3 days after you quit, but they do not often last for more than 2-3 weeks. You may get these symptoms:  Mood swings.  Feeling restless, nervous, angry, or annoyed.  Trouble concentrating.  Dizziness.  Strong desire for high-sugar foods and nicotine.  Weight gain.  Trouble pooping (constipation).  Feeling like you may vomit (nausea).  Coughing or a sore throat.  Changes in how the medicines that you take for other issues work in your body.  Depression.  Trouble sleeping (insomnia). Week 3 and afterward After the first 2-3 weeks of quitting, you may start to notice more positive results, such as:  Better sense of smell and taste.  Less coughing and sore throat.  Slower heart rate.  Lower blood pressure.  Clearer skin.   Better breathing.  Fewer sick days. Quitting smoking can be hard. Do not give up if you fail the first time.  Some people need to try a few times before they succeed. Do your best to stick to your quit plan, and talk with your doctor if you have any questions or concerns. Summary  Smoking tobacco is the leading cause of preventable death. Quitting smoking can be hard, but it is one of the best things that you can do for your health.  When you decide to quit smoking, make a plan to help you succeed.  Quit smoking right away, not slowly over a period of time.  When you start quitting, seek help from your doctor, family, or friends. This information is not intended to replace advice given to you by your health care provider. Make sure you discuss any questions you have with your health care provider. Document Released: 02/13/2009 Document Revised: 07/07/2018 Document Reviewed: 07/08/2018 Elsevier Patient Education  2020 [place] Risks of Smoking Smoking cigarettes is very bad for your health. Tobacco smoke has over 200 known poisons in it. It contains the poisonous gases nitrogen oxide and carbon monoxide. There are over 60 chemicals in tobacco smoke that cause cancer. Smoking is difficult to quit because a chemical in tobacco, called nicotine, causes addiction or dependence. When you smoke and inhale, nicotine is absorbed rapidly into the bloodstream through your lungs. Both inhaled and non-inhaled nicotine may be addictive. What are the risks of cigarette smoke? Cigarette smokers have an increased risk of many serious medical problems, including:  Lung cancer.  Lung disease, such as pneumonia, bronchitis, and emphysema.  Chest pain (angina) and heart attack because the heart is not getting enough oxygen.  Heart disease and peripheral blood vessel disease.  High blood pressure (hypertension).  Stroke.  Oral cancer, including cancer of the lip, mouth, or voice box.   Bladder cancer.  Pancreatic cancer.  Cervical cancer.  Pregnancy complications, including premature birth.  Stillbirths and smaller newborn babies, birth defects, and genetic damage to sperm.  Early menopause.  Lower estrogen level for women.  Infertility.  Facial wrinkles.  Blindness.  Increased risk of broken bones (fractures).  Senile dementia.  Stomach ulcers and internal bleeding.  Delayed wound healing and increased risk of complications during surgery.  Even smoking lightly shortens your life expectancy by several years. Because of secondhand smoke exposure, children of smokers have an increased risk of the following:  Sudden infant death syndrome (SIDS).  Respiratory infections.  Lung cancer.  Heart disease.  Ear infections. What are the benefits of quitting? There are many health benefits of quitting smoking. Here are some of them:  Within days of quitting smoking, your risk of having a heart attack decreases, your blood flow improves, and your lung capacity improves. Blood pressure, pulse rate, and breathing patterns start returning to normal soon after quitting.  Within months, your lungs may clear up completely.  Quitting for 10 years reduces your risk of developing lung cancer and heart disease to almost that of a nonsmoker.  People who quit may see an improvement in their overall quality of life. How do I quit smoking?     Smoking is an addiction with both physical and psychological effects, and longtime habits can be hard to change. Your health care provider can recommend:  Programs and community resources, which may include group support, education, or talk therapy.  Prescription medicines to help reduce cravings.  Nicotine replacement products, such as patches, gum, and nasal sprays. Use these products only as directed. Do not replace cigarette smoking with electronic cigarettes, which  are commonly called e-cigarettes. The safety of  e-cigarettes is not known, and some may contain harmful chemicals.  A combination of two or more of these methods. Where to find more information  American Lung Association: www.lung.org  American Cancer Society: www.cancer.org Summary  Smoking cigarettes is very bad for your health. Cigarette smokers have an increased risk of many serious medical problems, including several cancers, heart disease, and stroke.  Smoking is an addiction with both physical and psychological effects, and longtime habits can be hard to change.  By stopping right away, you can greatly reduce the risk of medical problems for you and your family.  To help you quit smoking, your health care provider can recommend programs, community resources, prescription medicines, and nicotine replacement products such as patches, gum, and nasal sprays. This information is not intended to replace advice given to you by your health care provider. Make sure you discuss any questions you have with your health care provider. Document Released: 05/27/2004 Document Revised: 07/21/2017 Document Reviewed: 04/23/2016 Elsevier Patient Education  2020 [employer].

## 2019-04-09 NOTE — Assessment & Plan Note (Signed)
Plan:  Please stop smoking  Pharmacy appt for smoking cessation  Follow up in 6 weeks with MR

## 2019-04-09 NOTE — Assessment & Plan Note (Addendum)
Plan:  Please stop smoking  Follow-up in 6 weeks

## 2019-04-10 DIAGNOSIS — E039 Hypothyroidism, unspecified: Secondary | ICD-10-CM | POA: Diagnosis not present

## 2019-04-10 DIAGNOSIS — M4316 Spondylolisthesis, lumbar region: Secondary | ICD-10-CM | POA: Diagnosis not present

## 2019-04-10 DIAGNOSIS — E782 Mixed hyperlipidemia: Secondary | ICD-10-CM | POA: Diagnosis not present

## 2019-04-10 DIAGNOSIS — J449 Chronic obstructive pulmonary disease, unspecified: Secondary | ICD-10-CM | POA: Diagnosis not present

## 2019-04-10 DIAGNOSIS — Z6825 Body mass index (BMI) 25.0-25.9, adult: Secondary | ICD-10-CM | POA: Diagnosis not present

## 2019-04-10 DIAGNOSIS — I10 Essential (primary) hypertension: Secondary | ICD-10-CM | POA: Diagnosis not present

## 2019-04-10 DIAGNOSIS — K52831 Collagenous colitis: Secondary | ICD-10-CM | POA: Diagnosis not present

## 2019-04-10 DIAGNOSIS — M4312 Spondylolisthesis, cervical region: Secondary | ICD-10-CM | POA: Diagnosis not present

## 2019-04-12 NOTE — Progress Notes (Signed)
Subjective Patient presents to [facility] and seen by the pharmacist for smoking cessation counseling.   Patient was referred and last seen by Wyn Quaker, FNP-C, on 04/09/2019.  At prior appt with Wyn Quaker, patient reported that she was smoking about 1 pack/day, which had increased during the COVID-19 pandemic.  She had run out of cigarettes yesterday and made the decision that she wanted to quit smoking.  She has tried multiple other therapies to stop smoking before and she has been unsuccessful.  Patient was contacted via telephone on 04/16/2019 for initial appt with pharmacy team. She states she recently tried to quit smoking "cold [place]", however, would like assistance. She went from smoking 1 PPD on 04/08/2019, not smoking any cigarettes on 04/09/2019, and then has used 1 PPD over the past week (04/10/2019 to 04/16/2019). Patient lives with her sister. She smokes in the car and in the house. She states her cravings to smoke are worst in the morning. Patient is unsure of the exact reaction bupropion has caused her in the past, however, the reaction on her med list "did not sound right". She thinks prior reaction with bupropion was due to how "bupropion made her feel bad or maybe it didn't work". She confirmed that she definitively remembers bupropion did not cause shortness of breath. She only remembers "problems with breathing" with Chantix. Patient asked for the bupropion allergy to be removed from her chart. Patient is adamant about wanting to re-trial bupropion for smoking cessation.   Patient specific reminders: 2 dogs (lexi (white palmeranian) and zoey (malti poo))  Social History   Tobacco Use  Smoking Status Current Every Day Smoker  . Packs/day: 1.00  . Years: 45.00  . Pack years: 45.00  . Types: Cigarettes  Smokeless Tobacco Never Used  Tobacco Comment   currently smoking 0.5ppd as of 12/05/2018     Tobacco Use History  Age when started using tobacco on a daily basis  40.  Type: cigarettes.  Number of cigarettes per day 1 PPD, brand Eagle or Pyramid (uses menthol)  Used to use [employer] however has "stepped down"  Smokes first cigarette within 30 minutes after waking.  Does not wake at night to smoke  Triggers include hand-to-mouth habit  Quit Attempt History   Most recent quit attempt 04/08/2019  Longest time ever been tobacco free "couple of days"  Methods tried in the past include  nicotine patches - skin irritation  nicotine gum - "didn't feel well"  nicotine lozenge - "stinged mouth"  chantix - "affected breathing - in Urgent Care for about 3 days"  wellbutrin - unsure of reaction, doesn't think it broke her out or affected breathing  hypnotherapy - "didn't last"    CBD oil - lacked efficacy  Rates IMPORTANCE of quitting tobacco on 1-10 scale of 10.  Rates READINESS of quitting tobacco on 1-10 scale of 5  Rates CONFIDENCE of quitting tobacco on 1-10 scale of  6 or 7.  Motivators to quitting, does not want to go on oxygen, wants to prevent lung cancer include health; barriers include fear of failing again   Fagerstrom Score Question Scoring Patient Score  How soon after waking do you smoke your first cigarette? <5 mins (3) 5-30 mins (2) 31-60 mins (1) >60 mins (0) 2  Do you find it difficult NOT to smoke in places where you shouldn't? Yes(1) No (0) 0  Which cigarette would you most hate to give up? First one in AM (1)  Any other  one (0) 1     How many cigarettes do you smoke/day? 10 or less (0) 11-20 (1) 21-30 (2) >30 (3) 1  Do you smoke more during the first few hours after waking? Yes (1) No (0) 1  Do you smoke if you are so ill you cannot get out of bed? Yes (1) No (0) 0   Total Score   Score interpretation: low 1-2, low-to-moderate 3-4, moderate 5-7, high >7  Total: 5  Assessment and Plan  1. Smoking Cessation -Initiated nicotine replacement tx with bupropion. It appears patient had anaphylactic-like  reaction with Chantix and the nicotine patches/gum/lozenges were ineffective.The only medication remaining to assist with smoking cessation is bupropion. Discussed with patient how shortness of breath/nausea/vomiting with bupropion in 2012 was listed on her allergy list. Patient is unsure of the exact reaction bupropion has caused her in the past, however, the reaction on her med list "did not sound right". She thinks prior reaction with bupropion was due to how "bupropion made her feel bad or maybe it didn't work". She confirmed that she definitively remembers bupropion did not cause shortness of breath. She only remembers "problems with breathing" with Chantix. Patient asked for the bupropion allergy to be removed from her chart. Patient is adamant about wanting to re-trial bupropion for smoking cessation. Stressed the importance of discontinuing bupropion if she has a bad reaction and advised patient to go to ED if she experiences SOB, especially considering the prior allergy in her chart. Patient verbalized understanding and is agreeable to seek medical attention if necessary as long as she can retrial bupropion for smoking cessation. Patient counseled on purpose, proper use, and potential adverse effects.   Bupropion (Quit Date: 04/30/2019) -Initiated bupropion XL 150 mg once daily then increase to 300 mg once daily after three days.Will use bupropion for 2 weeks prior to quit date. Patient with no PMH of seizures. Patient counseled on purpose, proper use, and potential adverse effects, including insomnia, and potential change in mood. It appears bupropion is a tier 1 agent on her insurance formulary (could not determine exact price). Instructed patient to call [place] clinic phone number and ask to speak with a pharmacist if she finds this medication is not affordable when she goes to pick it up at the pharmacy. Provided patient with [facility] clinic phone number and pharmacist names to follow up with if necessary  regarding side effect management. Follow up with patient in ~2 weeks to re-assess smoking cessation status. Patient verbalized understanding.  Non-pharmacologic: Werthers hard candy, chewing gum, keeping cigarettes in car, laying out specific amount of cigarettes to smoke each day and decreasing over time (idea from sister in law), smoke about 1/2 at a time  -Provided information on 1 800-QUIT NOW support program. Patient has contacted [facility] in the past and felt "they called too much". She is not interested in contacting [facility] at this time.  Thank you for involving pharmacy to assist in providing Ms. Mcquown's care.   Drexel Iha, PharmD PGY2 Ambulatory Care Pharmacy Resident

## 2019-04-16 ENCOUNTER — Ambulatory Visit (INDEPENDENT_AMBULATORY_CARE_PROVIDER_SITE_OTHER): Payer: PPO | Admitting: Pharmacist

## 2019-04-16 ENCOUNTER — Other Ambulatory Visit: Payer: Self-pay

## 2019-04-16 ENCOUNTER — Telehealth: Payer: Self-pay | Admitting: Pulmonary Disease

## 2019-04-16 DIAGNOSIS — Z720 Tobacco use: Secondary | ICD-10-CM | POA: Diagnosis not present

## 2019-04-16 MED ORDER — BUPROPION HCL ER (XL) 150 MG PO TB24: 150.0000 mg | ORAL_TABLET | Freq: Every day | ORAL | 11 refills | Status: DC

## 2019-04-16 NOTE — Telephone Encounter (Signed)
MR's first avail appt for non-ILD is 05/23/2019. Attempted to call pt but line rang and rang and no machine ever kicked in for me to be able to leave a VM. Will try to call back later.

## 2019-04-16 NOTE — Telephone Encounter (Signed)
04/16/2019 1202  Triage,  Please schedule the patient for follow-up with Dr. Chase Caller.  Patient was seen earlier in December.  Patient needs follow-up with Dr. Chase Caller in January/2021.  She is not an ILD patient.  Wyn Quaker, FNP

## 2019-04-23 ENCOUNTER — Encounter: Payer: Self-pay | Admitting: *Deleted

## 2019-04-23 NOTE — Telephone Encounter (Addendum)
Attempted to call pt to get her scheduled for an appt with MR per Aaron Edelman but unable to reach. Left pt a detailed message for pt letting her know that we need to schedule her for an appt with MR. Also let her know that a letter is going to be mailed to her address for her to call office to get the appt scheduled. Letter has been written and placed in the mail for pt. Nothing further needed.

## 2019-04-30 ENCOUNTER — Telehealth: Payer: Self-pay | Admitting: Pharmacist

## 2019-04-30 NOTE — Telephone Encounter (Signed)
Called patient on 04/30/2019 at 2:17 PM and left HIPAA-compliant VM with instructions to call [facility] back   Plan to discuss smoking cessation  Drexel Iha, PharmD PGY2 Ambulatory Care Pharmacy Resident

## 2019-04-30 NOTE — Telephone Encounter (Signed)
Called patient on 04/30/2019 at 2:56 PM.  Patient states she used bupropion XL 150 mg once daily for 3 days then increased to bupropion XL 300 mg daily for 2 days. She states she broke out in a rash. She wants to make her quit date 05/04/2019 and will throw away her cigarettes 05/04/2019. She is decreasing her cigarette intake each day to hopefully taper off all cigarettes by 05/04/2019. She states she smoked 10 cigarettes on 04/29/2019 and she has smoked 3 cigarettes on 04/30/2019. She finds spearmint chewing gum (NOT nicotine gum) helpful. She would like to try quitting "cold [place]" and using chewing gum/Werthers to cope with any nicotine urges. She states her sister recently made peppermint bark and she is planning on eating more peppermint bark as well as other sweets to help with any nicotine cravings.  Age when started using tobacco on a daily basis: 26  Preferred cigarette brand: Eagle or Pyramid (uses menthol) Tobacco use: 3-10 cigarettes daily Triggers: hand-to-mouth habit  Current smoking cessation agents: none   Prior smoking cessation agents:  ? nicotine patches - skin irritation ? nicotine gum - "didn't feel well" ? nicotine lozenge - "stinged mouth" ? chantix - "affected breathing - in Urgent Care for about 3 days" ? wellbutrin - unsure of reaction, doesn't think it broke her out or affected breathing ? hypnotherapy - "didn't last"  ? CBD oil - lacked efficacy Non-pharmacologic methods: Werthers hard candy, chewing gum, keeping cigarettes in car, laying out specific amount of cigarettes to smoke each day and decreasing over time (idea from sister in law), smoke about 1/2 at a time, peppermint bark  Motivation to quit: does not want to go on oxygen Rates IMPORTANCE of quitting tobacco on 1-10 scale of 10 Rates CONFIDENCE of quitting tobacco on 1-10 scale of 8 Goal: no cigarettes by 05/04/2019  Plan: Taper off cigarettes slowly --> 5 cigarettes at most on 12/28 --> 3 cigarettes on  12/29 --> 2 cigarettes on 12/30 --> 1 cigarette on 12/31 --> no cigarettes on 1/1. Patient verbalized understanding.  Follow up: 05/04/2019

## 2019-04-30 NOTE — Addendum Note (Signed)
Addended by: Ellwood Handler on: 04/30/2019 03:22 PM   Modules accepted: Orders

## 2019-05-04 ENCOUNTER — Telehealth: Payer: Self-pay | Admitting: Pharmacist

## 2019-05-04 NOTE — Telephone Encounter (Signed)
Called patient on 05/04/2019 at 2:45 PM and left HIPAA-compliant VM with instructions to call [facility] back   Plan to discuss smoking cessation. Call back in 1-2 weeks.  Drexel Iha, PharmD PGY2 Ambulatory Care Pharmacy Resident

## 2019-05-07 ENCOUNTER — Telehealth: Payer: Self-pay | Admitting: Pharmacist

## 2019-05-07 NOTE — Telephone Encounter (Signed)
Called patient on 05/07/2019 at 4:55 PM and left HIPAA-compliant VM with instructions to call [facility] back   Plan to discuss smoking cessation  Drexel Iha, PharmD PGY2 Ambulatory Care Pharmacy Resident

## 2019-05-21 ENCOUNTER — Telehealth: Payer: Self-pay | Admitting: Pharmacist

## 2019-05-21 NOTE — Telephone Encounter (Signed)
Called patient on 05/21/2019 at 9:53 AM and left HIPAA-compliant VM with instructions to call [facility] back   Plan to discuss smoking cessation  Drexel Iha, PharmD PGY2 Ambulatory Care Pharmacy Resident

## 2019-05-21 NOTE — Telephone Encounter (Signed)
Called patient on 05/21/2019 at 3:30 PM.  Patient has not been successful with quit attempt. She reports smoking 10 cigs/day (increase from last phone call when she was smoking 2-10 cigs/day). She has been trying to find Zonnic brand of smoking cessation gum/lozenge however has not been successful. Researched Zonnic brand quickly while on the phone - it appears it is not sold in [place] anymore. Relayed this information to patient. Patient does not like any smoking cessation agents available on the market and would prefer not to try any at this time.  Age when started using tobacco on a daily basis: 26  Preferred cigarette brand: Eagle or Pyramid (uses menthol) Tobacco use: 10 cigarettes daily Triggers: hand-to-mouth habit  Current smoking cessation agents: none   Prior smoking cessation agents:  ? nicotine patches- skin irritation ? nicotine gum -"didn't feel well" ? nicotine lozenge - "stinged mouth" ? chantix- "affected breathing- in Urgent Care for about 3 days" ? wellbutrin -unsure of reaction, doesn't think it broke her outor affected breathing ? hypnotherapy -"didn't last" ? CBD oil - lacked efficacy Non-pharmacologic methods: Werthers hard candy, chewing gum, keeping cigarettes in car, laying out specific amount of cigarettes to smoke each day and decreasing over time (idea from sister in law), smoke about 1/2 at a time, peppermint bark  Motivation to quit: does not want to go on oxygen  Goal: Smoke 9 cigarettes daily  Plan: Patient will have to taper off cigarettes slowly since no smoking cessation agents work for patient. Plan to decrease 1 cigarette per week and focus on non-pharm methods. Patient verbalized understanding.  Follow up: 1 week

## 2019-05-23 ENCOUNTER — Ambulatory Visit: Payer: PPO | Admitting: Internal Medicine

## 2019-05-23 ENCOUNTER — Encounter: Payer: Self-pay | Admitting: Internal Medicine

## 2019-05-23 ENCOUNTER — Other Ambulatory Visit: Payer: Self-pay

## 2019-05-23 VITALS — BP 144/60 | HR 80 | Temp 97.6°F | Ht 63.5 in | Wt 157.0 lb

## 2019-05-23 DIAGNOSIS — R911 Solitary pulmonary nodule: Secondary | ICD-10-CM | POA: Diagnosis not present

## 2019-05-23 DIAGNOSIS — R918 Other nonspecific abnormal finding of lung field: Secondary | ICD-10-CM

## 2019-05-23 DIAGNOSIS — F17200 Nicotine dependence, unspecified, uncomplicated: Secondary | ICD-10-CM

## 2019-05-23 DIAGNOSIS — J432 Centrilobular emphysema: Secondary | ICD-10-CM

## 2019-05-23 NOTE — Progress Notes (Signed)
PCP Merrilee Seashore, MD   HPI   10/22/10 Initial Consult  DYspnea: This is a new (79 year old overweight female.  Smoker 1/2 ppd minium since age 42) problem. The current episode started more than 1 year ago (Insidous onset, several years ago). The problem occurs daily. The problem has been gradually worsening (Progressively worse past few months. Unable to breahe as deep as before. Currently dyspnic walking to mail box and back. Notices it [place] yard work as well). Pertinent negatives include no abdominal pain, chest pain, claudication, coryza, ear pain, fever, headaches, hemoptysis, leg swelling, neck pain, orthopnea, PND, rash, rhinorrhea, sore throat, sputum production, swollen glands, syncope, vomiting or wheezing. The symptoms are aggravated by exercise, any activity and weather changes (heat makes dyspnea worse. Walking to mail box and back makes dyspnea worse). Associated symptoms comments: Feels she is unable to take a deep breath. Asspociated cough + but feels currently unable to expectorate. Reports normal cardiac stress test by Dr Glade Lloyd 2005 approximately. Reports normal resting annual ekg. Risk factors include smoking. She has tried ipratropium inhalers and oral steroids (is on spiriva currently along with albuterol but no relief. has been on prednisone bursts with cold and cough which has helped  always. Sh e is confused about her medications) for the symptoms. The treatment provided no relief. There is no history of allergies, aspirin allergies, asthma, bronchiolitis, CAD, chronic lung disease, COPD, DVT, a heart failure, PE, pneumonia or a recent surgery. (Cxr nov 2008 reviewed - suggests hyperinflation. CT abd lung cut  05/25/2010 - kyphotic, possible emphsyemai in lung field)   Walking 185 feet x 3 laps in office she did not desaturate. Though she denies COPD, I noticed on med list she is on spirivia and advair; I did not get a chance to question her about it >>referred to  Pulm . Rehab.    OV 01/08/2011:  Followup Gold stage 1 copd and Smoking. Last OV NP started her on zyban but having same intolerance to it like chantix; nausea and dyspnea. So she quit zyban. She started off with bid regimen on zyban and was not taking it after meals. Nicotine patch made her itch and local rash. Benadryl for it made sleepy. In terms of dyspnea, compliant with spiriva and inhaled steroid. Clas 1-2 dyspnea is stable. She is willing to attend rehab. Willing to have flu shot today. No cough. Some associated baseline edema that is unchanged. No fever, no sputum.  PFT 11/09/2010>>FEV1 2.00 l/m  (92%), ratio 68, no significant change with SABA. Decreased mid flows 42%, DLCO 66%  And reduced. RV 133% and positive for air trapping    IOV 12/23/2017  Chief Complaint  Patient presents with  . Consult    Referred by Dr. Merrilee Seashore. Pt is a former pt of MR last seen 04/12/11. Pt was spitting up blood and was seen in the ER last thursday, 12/15/17. Pt just finished up abx last night, 12/22/17. Pt states she was told she had pna. Pt has c/o SOB and occ. cough. Denies any CP.   79 year old female that I personally last saw 7 years ago for early stage COPD. After that lost to follow-up. She split follows up with her primary care physician. In the interim and appears she has continued to smoke. She is on triple inhaler therapy  TRELEGY n June 2019 pulmonary function test suggested Gold stage II COPD. She is here because of new onset of hemoptysis.history is gained from her and  review of the outside medical records and past chart.Outside  medical chart is from her primary care physician.it appears on 12/09/2017 while after using nico block to quit smoking she started having hemoptysis. This was mi A few days latershe saw primary care physician got antibiotic course. But the hemoptysis then got worse and 12/15/2017 she went to the emergency department where she had a CT scan of the chest that I  personally visualized and as documented below.Ruled out for pulmonary embolism has emphysema but has right upper and lower lobe airway opacification with infiltrates. No masses no ILD. She was given another course of antibiotic which he just finished yesterday/today but she still has some ongoing intermittent mild hemoptysis. Overall the intensity of hemoptysis is better by over 50% according to her history. She says she also had significant wheezing but this is also better with just antibiotics. She never got any prednisone. She started quit smoking with the last cigarette being this morning but she admits she can easily relapse.   IMPRESSION: 1.  Negative for acute pulmonary embolus. 2. Chronic lung disease with hyperinflation. Subsegmental right upper and lower lobe airway opacification, associated with peribronchial opacity in the right upper lobe compatible with bronchopneumonia. No areas of active bleeding/contrast extravasation identified. No pleural effusion. 3. Calcified coronary artery and Aortic Atherosclerosis (ICD10-I70.0).   Electronically Signed   By: Genevie Ann M.D.   On: 12/15/2017 15:39     OV 01/24/2018  Subjective:  Patient ID: Joann Barnes, female , DOB: 14-Mar-1941 , age 43 y.o. , MRN: [id number] , ADDRESS: [address] [place] [address]   01/24/2018 -   Chief Complaint  Patient presents with  . Follow-up    Pt states she is feeling better since her last visit. States she is no longer coughing up the blood.  States she is still coughing but that is better.     HPI Joann Barnes 79 y.o. -presents for follow-up of several issues  #Cough with hemoptysis related to pneumonia process on the CT scan August 2019: Since her last visit after I gave her prednisone no further hemoptysis.  She feels her pneumonia has resolved.  She feels stable.  #Smoking she continues to smoke.  She contemplated vaping in an effort to quit smoking but after seeing medical  reports she is decided not to do it.    #Emphysema: She is on Trelegy inhaler.  Symptoms are stable.  She says she will have her high-dose flu shot with her primary care physician.  She says she has never had a pneumonia vaccine and was inquiring about it.  #New issue: On exam we have discovered a systolic murmur.  She thought last echocardiogram was in 2017.  Review of the chart shows primary care physician did this in 2019 and she has mild aortic stenosis.  OV 04/04/2018  Subjective:  Patient ID: Joann Barnes, female , DOB: 04-16-1941 , age 36 y.o. , MRN: [id number] , ADDRESS: [address] [place] [address]   04/04/2018 -   Chief Complaint  Patient presents with  . Follow-up    She is doing better at times but she is having more mucus.     HPI Joann Barnes 79 y.o. -is here for follow-up of several issues  Emphysema: COPD CAT score is 15.  She feels stable.  She continues Trelegy.  She is up-to-date with her vaccines  Smoking: She continues to smoke but says she will work  on quitting on her own  Hemoptysis: This is resolved.  She had a CT scan for follow-up.  This shows improvement and resolution of debris.  She has a 4 mm lung nodule that would require follow-up in a year because of her continued smoking and risk for lung cancer  Lung nodule 4 mm: As discussed above this will require follow-up in December 2020  New finding: Coronary artery calcification on CT scan.  She denies any chest pain but she does have exertional dyspnea.  She does not have a cardiologist.  In fact her primary care physician was asking her about this.  She is interested in a referral    Personally visualized the CT chest and agree with the findings. Ct Chest Wo Contrast  Result Date: 04/03/2018 CLINICAL DATA:  Persistent cough pneumonia in August. EXAM: CT CHEST WITHOUT CONTRAST TECHNIQUE: Multidetector CT imaging of the chest was performed following the standard protocol without IV contrast.  COMPARISON:  12/15/2017. FINDINGS: Cardiovascular: Atherosclerotic calcification of the arterial vasculature, including three-vessel involvement of the coronary arteries and aortic valve. Heart size normal. No pericardial effusion. Mediastinum/Nodes: Mediastinal lymph nodes are not enlarged by CT size criteria. Hilar regions are difficult to evaluate without IV contrast. No axillary adenopathy. Esophagus is grossly unremarkable. Lungs/Pleura: Biapical pleuroparenchymal scarring. Interval clearing of previously seen mucoid impaction and ground-glass in the right upper and right lower lobes. A few scattered pulmonary nodules measure 4 mm or less in size, as before. No pleural fluid. Debris is seen in bronchus intermedius. Upper Abdomen: Visualized portions of the liver, adrenal glands, kidneys, spleen pancreas stomach are grossly unremarkable. No upper abdominal adenopathy. Musculoskeletal: Degenerative changes in the spine. No worrisome lytic or sclerotic lesions. Anterolisthesis of T1 on T2 with advanced secondary degenerative disc disease. IMPRESSION: 1. No acute findings. Interval clearing of mucoid impaction and ground-glass in the right upper and right lower lobes. 2. Scattered pulmonary nodules measure 4 mm or less size, stable. No follow-up needed if patient is low-risk (and has no known or suspected primary neoplasm). Non-contrast chest CT can be considered in 12 months if patient is high-risk. This recommendation follows the consensus statement: Guidelines for Management of Incidental Pulmonary Nodules Detected on CT Images: From the Fleischner Society 2017; Radiology 2017; 284:228-243. 3. Aortic atherosclerosis (ICD10-170.0). Three-vessel coronary artery calcification. Electronically Signed   By: Lorin Picket M.D.   On: 04/03/2018 10:40       OV 05/23/2019  Subjective:  Patient ID: Joann Barnes, female , DOB: 12-Oct-1940 , age 28 y.o. , MRN: [id number] , ADDRESS: [address] [place]  [place] [address]   05/23/2019 -   Chief Complaint  Patient presents with  . Follow-up     HPI Joann Barnes 79 y.o. -    COPD/emphysema: She continues on Trelegy.  Symptoms are minimal and stable.  She is doing well.  Does not use oxygen  Smoking: She is unable to quit.  She tried to use Wellbutrin earlier this year or last month and had significant allergies.  Because of the significant history of allergies to drugs she is nervous to take the COVID-19 vaccine  Lung nodule: She had follow-up 1 year scan in December 2020.  The report was reviewed.  This does not show any lung nodule.  At this point in time because she is over 60 and there is no lung nodule she does not need any follow-up CT chest for the purpose of lung cancer screening.   CAT  COPD Symptom & Quality of Life Score ([place] trademark) 0 is no burden. 5 is highest burden 04/04/2018   Never Cough -> Cough all the time 3  No phlegm in chest -> Chest is full of phlegm 3  No chest tightness -> Chest feels very tight 0  No dyspnea for 1 flight stairs/hill -> Very dyspneic for 1 flight of stairs 5  No limitations for ADL at home -> Very limited with ADL at home 0  Confident leaving home -> Not at all confident leaving home 0  Sleep soundly -> Do not sleep soundly because of lung condition 0  Lots of Energy -> No energy at all 4  TOTAL Score (max 40)  15    CT chest dec 2020   Lungs/Pleura: Biapical pleuroparenchymal scarring. Centrilobular emphysema. Scattered subpleural nodular densities measure up to 3 mm in the right lower lobe, unchanged and benign. Lungs are otherwise clear. No pleural fluid. Airway is unremarkable.  Upper Abdomen: Visualized portion of the liver is unremarkable. Slight thickening both adrenal glands. Visualized portions of the kidneys, spleen, pancreas, stomach and bowel are unremarkable with exception of a moderate hiatal hernia. No upper abdominal adenopathy.  Musculoskeletal: Degenerative changes  in the spine. No worrisome lytic or sclerotic lesions. Grade 1 anterolisthesis of T1 on T2 with advanced secondary degenerative disc disease.  IMPRESSION: 1. No findings to explain the patient's given symptoms. 2. Grade 1 anterolisthesis of T1 on T2 advanced degenerative disc disease. 3.  Emphysema (ICD10-J43.9). 4. Aortic atherosclerosis (ICD10-170.0). Coronary artery calcification.   Electronically Signed   By: Lorin Picket M.D. ROS - per HPI     has a past medical history of Aortic stenosis, Cancer ([place]), COPD (chronic obstructive pulmonary disease) ([place]), Dyspnea, GERD (gastroesophageal reflux disease), Hematuria, Hernia, hiatal, HTN (hypertension), Hyperlipidemia, Hypothyroid, Migraine, OA (osteoarthritis), OP (osteoporosis), Other and unspecified angina pectoris, Pneumonia, and Spondylolisthesis of lumbar region.   reports that she has been smoking cigarettes. She has a 45.00 pack-year smoking history. She has never used smokeless tobacco.  Past Surgical History:  Procedure Laterality Date  . APPENDECTOMY    . CATARACT EXTRACTION, BILATERAL    . CERVICAL DISC ARTHROPLASTY     x 2  . COLONOSCOPY    . ESOPHAGOGASTRODUODENOSCOPY  07/19/00  . HAND SURGERY Right 2014  . LAMINECTOMY  05/23/2017   L4-5, L5-S1    Allergies  Allergen Reactions  . Bactrim [Sulfamethoxazole-Trimethoprim] Shortness Of Breath  . Nicotine Anaphylaxis and Other (See Comments)    Rash and itch with patch locally  Chewing the gum closed her throat  . Codeine Itching and Nausea And Vomiting  . Lipitor [Atorvastatin] Other (See Comments)    Muscle aches  . Benadryl [Diphenhydramine Hcl] Other (See Comments)    Drowsiness   . Chantix [Varenicline Tartrate] Hives, Itching, Nausea And Vomiting and Rash  . Chantix [Varenicline] Hives, Itching, Nausea And Vomiting and Rash  . Wellbutrin [Bupropion] Rash    Slight rash around ankles  . Zolpidem Tartrate Other (See Comments)    Bad dreams     Immunization History  Administered Date(s) Administered  . Influenza Whole 01/31/2010, 01/08/2011  . Influenza, High Dose Seasonal PF 05/03/2016, 02/02/2018  . Influenza-Unspecified 12/06/2015  . Pneumococcal Conjugate-13 01/24/2018  . Zoster Recombinat (Shingrix) 02/14/2018, 04/15/2018    Family History  Problem Relation Age of Onset  . Heart disease Mother   . Heart disease Father   . Heart disease Brother   . Cancer Sister  breast  . Cancer Sister        lung     Current Outpatient Medications:  .  albuterol (ACCUNEB) 1.25 MG/3ML nebulizer solution, Take 1 ampule by nebulization daily as needed for wheezing or shortness of breath. , Disp: , Rfl:  .  amitriptyline (ELAVIL) 10 MG tablet, Take 10 mg by mouth at bedtime as needed for sleep. , Disp: , Rfl:  .  amLODipine (NORVASC) 5 MG tablet, Take 5 mg by mouth daily.  , Disp: , Rfl:  .  amoxicillin (AMOXIL) 500 MG tablet, Take 500 mg by mouth 2 (two) times daily., Disp: , Rfl:  .  aspirin EC 81 MG EC tablet, Take 1 tablet (81 mg total) by mouth daily., Disp: , Rfl:  .  calcium carbonate (TUMS - DOSED IN MG ELEMENTAL CALCIUM) 500 MG chewable tablet, Chew 2 tablets by mouth daily., Disp: , Rfl:  .  Cholecalciferol (VITAMIN D3) 2000 units TABS, Take 2,000 Units by mouth daily., Disp: , Rfl:  .  diazepam (VALIUM) 5 MG tablet, Take 5 mg by mouth 2 (two) times daily as needed (for back pain or anxiety). , Disp: , Rfl:  .  escitalopram (LEXAPRO) 20 MG tablet, Take 20 mg by mouth daily. , Disp: , Rfl:  .  Fluticasone-Umeclidin-Vilant (TRELEGY ELLIPTA) 100-62.5-25 MCG/INH AEPB, Inhale 1 puff into the lungs daily., Disp: , Rfl:  .  gabapentin (NEURONTIN) 300 MG capsule, Take 300 mg by mouth daily., Disp: , Rfl:  .  ibandronate (BONIVA) 3 MG/3ML SOLN injection, Inject 3 mLs (3 mg total) into the vein every 3 (three) months., Disp: , Rfl:  .  levothyroxine (LEVOXYL) 25 MCG tablet, Take 25 mcg by mouth daily.  , Disp: , Rfl:  .   olmesartan (BENICAR) 5 MG tablet, Take 5 mg by mouth daily., Disp: , Rfl:  .  omeprazole (PRILOSEC) 40 MG capsule, Take 40 mg by mouth daily before breakfast. , Disp: , Rfl:  .  pravastatin (PRAVACHOL) 80 MG tablet, Take 80 mg by mouth at bedtime. , Disp: , Rfl:  .  PROAIR HFA 108 (90 Base) MCG/ACT inhaler, Inhale 2 puffs into the lungs 2 (two) times daily. , Disp: , Rfl:  .  vitamin C (ASCORBIC ACID) 250 MG tablet, Take 250 mg by mouth daily., Disp: , Rfl:       Objective:   Vitals:   05/23/19 0958  BP: (!) 144/60  Pulse: 80  Temp: 97.6 F (36.4 C)  TempSrc: Oral  SpO2: 96%  Weight: 157 lb (71.2 kg)  Height: 5' 3.5" (1.613 m)    Estimated body mass index is 27.38 kg/m as calculated from the following:   Height as of this encounter: 5' 3.5" (1.613 m).   Weight as of this encounter: 157 lb (71.2 kg).  @WEIGHTCHANGE @  [employer]   05/23/19 0958  Weight: 157 lb (71.2 kg)     Physical Exam  General Appearance:    Alert, cooperative, no distress, appears stated age - yes , Deconditioned looking - no , OBESE  - no, Sitting on Wheelchair -  no  Head:    Normocephalic, without obvious abnormality, atraumatic  Eyes:    PERRL, conjunctiva/corneas clear,  Ears:    Normal TM's and external ear canals, both ears  Nose:   Nares normal, septum midline, mucosa normal, no drainage    or sinus tenderness. OXYGEN ON  - no . Patient is @ ra   Throat:   Lips, mucosa, and  tongue normal; teeth and gums normal. Cyanosis on lips - no  Neck:   Supple, symmetrical, trachea midline, no adenopathy;    thyroid:  no enlargement/tenderness/nodules; no carotid   bruit or JVD  Back:     Symmetric, no curvature, ROM normal, no CVA tenderness  Lungs:     Distress - no , Wheeze no, Barrell Chest - no, Purse lip breathing - no, Crackles - no   Chest Wall:    No tenderness or deformity.    Heart:    Regular rate and rhythm, S1 and S2 normal, no rub   or gallop, Murmur - no  Breast Exam:    NOT DONE    Abdomen:     Soft, non-tender, bowel sounds active all four quadrants,    no masses, no organomegaly. Visceral obesity - no  Genitalia:   NOT DONE  Rectal:   NOT DONE  Extremities:   Extremities - normal, Has Cane - no, Clubbing - no, Edema - no  Pulses:   2+ and symmetric all extremities  Skin:   Stigmata of Connective Tissue Disease - no  Lymph nodes:   Cervical, supraclavicular, and axillary nodes normal  Psychiatric:  Neurologic:   Pleasant - yes, Anxious - no, Flat affect - no  CAm-ICU - neg, Alert and Oriented x 3 - yes, Moves all 4s - yes, Speech - normal, Cognition - intact           Assessment:       ICD-10-CM   1. Centrilobular emphysema ([place])  J43.2   2. Smoking  F17.200   3. Abnormal findings on diagnostic imaging of lung  R91.8        Plan:     Patient Instructions    Other emphysema (HCC)  --Stable lung disease -Continue Trelegy schedule and albuterol as needed  Smoking -Try to work on quitting - too bad wellbutrin and chantix are causing allergies     Lung nodule 4 mm - resolved -Repeat CT scan of the chest without contrast in 1 year which is December 2020 - did not show lung nodule - no further CT scan needed   Follow-up -9-12 months for copd and smoking; CAT score at followup      SIGNATURE    Dr. Brand Males, M.D., F.C.C.P,  Pulmonary and Critical Care Medicine Staff Physician, [place] Director - Interstitial Lung Disease  Program  [facility] [place], [place], [address]  Pager: [phone], If no answer or between  15:00h - 7:00h: call [phone] Telephone: [phone]  10:31 AM 05/23/2019

## 2019-05-23 NOTE — Addendum Note (Signed)
Addended by: Jannette Spanner on: 05/23/2019 11:31 AM   Modules accepted: Orders

## 2019-05-23 NOTE — Patient Instructions (Addendum)
   Other emphysema (HCC)  --Stable lung disease -Continue Trelegy schedule and albuterol as needed  Smoking -Try to work on quitting - too bad wellbutrin and chantix are causing allergies     Lung nodule 4 mm - resolved -Repeat CT scan of the chest without contrast in 1 year which is December 2020 - did not show lung nodule - no further CT scan needed   Follow-up -9-12 months for copd and smoking; CAT score at followup

## 2019-05-28 ENCOUNTER — Telehealth: Payer: Self-pay | Admitting: Pharmacist

## 2019-05-28 NOTE — Telephone Encounter (Signed)
Called patient on 05/28/2019 at 4:53 PM   She reports smoking 8 cigarettes daily (decrease from 10 cigarettes). Congratulated patient for her efforts!!! She continues to use Werthers hard candy and gum. She had questions regarding COVID-19 vaccine; all questions thoroughly answered based on literature from [employer] and [place] clinical trials. She reports having dry mouth.  Age when started using tobacco on a daily basis:26 Preferred cigarette brand:Eagle or Pyramid (uses menthol) Tobacco use:8 cigarettes daily Triggers:hand-to-mouth habit  Current smoking cessation agents:none Prior smoking cessation agents: ? nicotine patches- skin irritation ? nicotine gum -"didn't feel well" ? nicotine lozenge - "stinged mouth" ? chantix- "affected breathing- in Urgent Care for about 3 days" ? wellbutrin -unsure of reaction, doesn't think it broke her outor affected breathing ? hypnotherapy -"didn't last" ? CBD oil - lacked efficacy Non-pharmacologic methods:Werthers hard candy, chewing gum, keeping cigarettes in car, laying out specific amount of cigarettes to smoke each day and decreasing over time (idea from sister in law), smoke about 1/2 at a time, peppermint bark  Motivation to quit:does not want to go on oxygen  Goal: Smoke 7 cigarettes daily  Plan:  1. Patient will have to taper off cigarettes slowly since no smoking cessation agents work for patient. Plan to decrease 1 cigarette per week and focus on non-pharm methods.   2. Continue current non-pharm methods as well as initiate using biotene lozenges (help with dry mouth and may help distract patient from smoking).  Follow up: 1 week

## 2019-05-30 ENCOUNTER — Other Ambulatory Visit (HOSPITAL_COMMUNITY): Payer: Self-pay | Admitting: *Deleted

## 2019-05-31 ENCOUNTER — Inpatient Hospital Stay (HOSPITAL_COMMUNITY): Admission: RE | Admit: 2019-05-31 | Payer: PPO | Source: Ambulatory Visit

## 2019-06-04 ENCOUNTER — Telehealth: Payer: Self-pay | Admitting: Pharmacist

## 2019-06-04 NOTE — Telephone Encounter (Signed)
Called patient on 06/04/2019 at 4:41 PM   Patient states smoking more than 8 cigarettes this past weekend due to staying inside. She has been struggling with cravings lately. She continues to use Werthers hard candy and gum, but is nervous about eating too much hard candy and gaining weight. Reminded patient it is better to gain a few pounds than it is to smoke.   Age when started using tobacco on a daily basis:26 Preferred cigarette brand:Eagle or Pyramid (uses menthol) Tobacco use:8 cigarettes daily Triggers:hand-to-mouth habit  Current smoking cessation agents:none Prior smoking cessation agents: ? nicotine patches- skin irritation ? nicotine gum -"didn't feel well" ? nicotine lozenge - "stinged mouth" ? chantix- "affected breathing- in Urgent Care for about 3 days" ? wellbutrin -unsure of reaction, doesn't think it broke her outor affected breathing ? hypnotherapy -"didn't last" ? CBD oil - lacked efficacy Non-pharmacologic methods:Werthers hard candy, chewing gum, keeping cigarettes in car, laying out specific amount of cigarettes to smoke each day and decreasing over time (idea from sister in law), smoke about 1/2 at a time, peppermint bark  Motivation to quit:does not want to go on oxygen  Goal:Smoke 8 cigarettes daily  Plan: 1. Patient will have to taper off cigarettes slowly since no smoking cessation agents work for patient. It appears that patient has been struggling cutting out cigarettes more lately -- will plan to slow taper to cutting out 1 cigarette every 2 weeks. Patient verbalized understanding. 2. Continue current non-pharm methods. Patient has been setting time to stop smoking at 10-10:30 PM each night. Advised patient to make that time 9-9:30 PM. Patient is agreeable to trying this.  Follow up:1 week

## 2019-06-08 ENCOUNTER — Ambulatory Visit (HOSPITAL_COMMUNITY)
Admission: RE | Admit: 2019-06-08 | Discharge: 2019-06-08 | Disposition: A | Discharge status: Home / Self Care | Payer: PPO | Source: Ambulatory Visit | Attending: Internal Medicine | Admitting: Internal Medicine

## 2019-06-08 ENCOUNTER — Other Ambulatory Visit: Payer: Self-pay

## 2019-06-08 DIAGNOSIS — M810 Age-related osteoporosis without current pathological fracture: Secondary | ICD-10-CM | POA: Insufficient documentation

## 2019-06-08 MED ORDER — IBANDRONATE SODIUM 3 MG/3ML IV SOLN: 3.0000 mg | Freq: Once | INTRAVENOUS | Status: DC

## 2019-06-08 MED ORDER — IBANDRONATE SODIUM 3 MG/3ML IV SOLN
INTRAVENOUS | Status: AC
  Administered 2019-06-08: 3 mg
  Filled 2019-06-08: qty 3

## 2019-06-11 ENCOUNTER — Telehealth: Payer: Self-pay | Admitting: Pharmacist

## 2019-06-11 NOTE — Telephone Encounter (Signed)
Called patient on 06/11/2019 at 3:28 PM   Patient was taking a nap and was unable to talk at the current moment. Will follow up with patient on Friday 06/15/2019.   Drexel Iha, PharmD PGY2 Ambulatory Care Pharmacy Resident

## 2019-06-15 ENCOUNTER — Telehealth: Payer: Self-pay | Admitting: Pharmacist

## 2019-06-15 NOTE — Telephone Encounter (Signed)
Disregard prior note. Follow up timeframe was incorrect.  Called patient on 06/15/2019 at 2:15 PM   Patient states she is still smoking about 8 cigarettes per day, but finds herself smoking more lately due to having to stay inside from COVID-19. She has been successful with attempting to stop smoking around 9:30PM each night.  Age when started using tobacco on a daily basis:26 Preferred cigarette brand:Eagle or Pyramid (uses menthol) Tobacco use:8cigarettes daily Triggers:hand-to-mouth habit  Current smoking cessation agents:none Prior smoking cessation agents: ? nicotine patches- skin irritation ? nicotine gum -"didn't feel well" ? nicotine lozenge - "stinged mouth" ? chantix- "affected breathing- in Urgent Care for about 3 days" ? wellbutrin -unsure of reaction, doesn't think it broke her outor affected breathing ? hypnotherapy -"didn't last" ? CBD oil - lacked efficacy Non-pharmacologic methods:Werthers hard candy, chewing gum, keeping cigarettes in car, laying out specific amount of cigarettes to smoke each day and decreasing over time (idea from sister in law) - has not implemented yet as of 06/15/19, smoke about 1/2 at a time, peppermint bark  Motivation to quit:does not want to go on oxygen  Goal:Smoke8cigarettes daily  Plan: 1.Patient will have to taper off cigarettes slowly since no smoking cessation agents work for patient.It appears that patient has been struggling cutting out cigarettes more lately -- will plan to taper even slower than originally anticipated -- plan t cut out 1 cigarette each month. Patient verbalized understanding. 2. Continue current non-pharm methods. Discussed with patient to lay out only 8 cigarettes each day to limit herself to. Pateint is agreeable.  Follow up:1 month

## 2019-06-15 NOTE — Telephone Encounter (Signed)
Called patient on 06/15/2019 at 2:15 PM   Patient states she is still smoking about 8 cigarettes per day, but finds herself smoking more lately due to having to stay inside from COVID-19. She has been successful with attempting to stop smoking around 9:30PM each night.  Age when started using tobacco on a daily basis:26 Preferred cigarette brand:Eagle or Pyramid (uses menthol) Tobacco use:8cigarettes daily Triggers:hand-to-mouth habit  Current smoking cessation agents:none Prior smoking cessation agents: ? nicotine patches- skin irritation ? nicotine gum -"didn't feel well" ? nicotine lozenge - "stinged mouth" ? chantix- "affected breathing- in Urgent Care for about 3 days" ? wellbutrin -unsure of reaction, doesn't think it broke her outor affected breathing ? hypnotherapy -"didn't last" ? CBD oil - lacked efficacy Non-pharmacologic methods:Werthers hard candy, chewing gum, keeping cigarettes in car, laying out specific amount of cigarettes to smoke each day and decreasing over time (idea from sister in law) - has not implemented yet as of 06/15/19, smoke about 1/2 at a time, peppermint bark  Motivation to quit:does not want to go on oxygen  Goal:Smoke8cigarettes daily  Plan: 1.Patient will have to taper off cigarettes slowly since no smoking cessation agents work for patient. It appears that patient has been struggling cutting out cigarettes more lately -- will plan to taper even slower than originally anticipated -- plan t cut out 1 cigarette each month. Patient verbalized understanding. 2. Continue current non-pharm methods. Discussed with patient to lay out only 8 cigarettes each day to limit herself to. Pateint is agreeable.  Follow up:1 week

## 2019-06-16 ENCOUNTER — Ambulatory Visit: Payer: PPO | Attending: Internal Medicine

## 2019-06-16 DIAGNOSIS — Z23 Encounter for immunization: Secondary | ICD-10-CM | POA: Insufficient documentation

## 2019-06-16 NOTE — Progress Notes (Signed)
   Covid-19 Vaccination Clinic  Name:  Joann Barnes    MRN: [id number] DOB: [birth year]  06/16/2019  [name] was observed post Covid-19 immunization for 15 minutes without incidence. She was provided with Vaccine Information Sheet and instruction to access the V-Safe system.   [name] was instructed to call 911 with any severe reactions post vaccine: Marland Kitchen Difficulty breathing  . Swelling of your face and throat  . A fast heartbeat  . A bad rash all over your body  . Dizziness and weakness    Immunizations Administered    Name Date Dose VIS Date Route   Pfizer COVID-19 Vaccine 06/16/2019 10:44 AM 0.3 mL 04/13/2019 Intramuscular   Manufacturer: [place]   Lot: [id number]   [place]: [id number]

## 2019-06-18 ENCOUNTER — Other Ambulatory Visit: Payer: PPO

## 2019-06-18 ENCOUNTER — Ambulatory Visit: Payer: PPO

## 2019-06-18 ENCOUNTER — Other Ambulatory Visit: Payer: Self-pay

## 2019-06-18 DIAGNOSIS — I6523 Occlusion and stenosis of bilateral carotid arteries: Secondary | ICD-10-CM

## 2019-06-20 ENCOUNTER — Other Ambulatory Visit: Payer: Self-pay | Admitting: Cardiology

## 2019-06-20 DIAGNOSIS — I6523 Occlusion and stenosis of bilateral carotid arteries: Secondary | ICD-10-CM

## 2019-07-02 DIAGNOSIS — R208 Other disturbances of skin sensation: Secondary | ICD-10-CM | POA: Diagnosis not present

## 2019-07-02 DIAGNOSIS — L905 Scar conditions and fibrosis of skin: Secondary | ICD-10-CM | POA: Diagnosis not present

## 2019-07-02 DIAGNOSIS — L820 Inflamed seborrheic keratosis: Secondary | ICD-10-CM | POA: Diagnosis not present

## 2019-07-02 DIAGNOSIS — Z85828 Personal history of other malignant neoplasm of skin: Secondary | ICD-10-CM | POA: Diagnosis not present

## 2019-07-02 DIAGNOSIS — L821 Other seborrheic keratosis: Secondary | ICD-10-CM | POA: Diagnosis not present

## 2019-07-03 ENCOUNTER — Telehealth: Payer: Self-pay

## 2019-07-03 NOTE — Telephone Encounter (Signed)
Patient called and asked for Carotid results done 06/18/19

## 2019-07-03 NOTE — Telephone Encounter (Signed)
Carotid artery duplex 06/18/2019:  Minimal disease right and <50% left. Follow up in one year is appropriate if clinically indicated. No  significant change since 05/17/2018.

## 2019-07-07 ENCOUNTER — Ambulatory Visit: Payer: PPO | Attending: Internal Medicine

## 2019-07-07 DIAGNOSIS — Z23 Encounter for immunization: Secondary | ICD-10-CM | POA: Insufficient documentation

## 2019-07-07 NOTE — Progress Notes (Signed)
   Covid-19 Vaccination Clinic  Name:  Joann Barnes    MRN: [id number] DOB: [birth year]  07/07/2019  Joann Barnes was observed post Covid-19 immunization for 15 minutes without incident. She was provided with Vaccine Information Sheet and instruction to access the V-Safe system.   Joann Barnes was instructed to call 911 with any severe reactions post vaccine: Marland Kitchen Difficulty breathing  . Swelling of face and throat  . A fast heartbeat  . A bad rash all over body  . Dizziness and weakness   Immunizations Administered    Name Date Dose VIS Date Route   Pfizer COVID-19 Vaccine 07/07/2019 10:25 AM 0.3 mL 04/13/2019 Intramuscular   Manufacturer: [place]   Lot: [id number]   [place]: [id number]

## 2019-07-16 ENCOUNTER — Telehealth: Payer: Self-pay | Admitting: Pharmacist

## 2019-07-16 DIAGNOSIS — Z961 Presence of intraocular lens: Secondary | ICD-10-CM | POA: Diagnosis not present

## 2019-07-16 DIAGNOSIS — H40013 Open angle with borderline findings, low risk, bilateral: Secondary | ICD-10-CM | POA: Diagnosis not present

## 2019-07-16 DIAGNOSIS — H26491 Other secondary cataract, right eye: Secondary | ICD-10-CM | POA: Diagnosis not present

## 2019-07-16 DIAGNOSIS — H524 Presbyopia: Secondary | ICD-10-CM | POA: Diagnosis not present

## 2019-07-16 DIAGNOSIS — H02132 Senile ectropion of right lower eyelid: Secondary | ICD-10-CM | POA: Diagnosis not present

## 2019-07-16 NOTE — Telephone Encounter (Signed)
Called patient on 07/16/2019 at 4:48 PM. Unable to leave HIPAA-compliant VM considering phone line was busy  Unsuccessful attempt #1  Will re-attempt to contact patient on Friday (07/20/2019)  Thank you for involving pharmacy to assist in providing this patient's care.   Drexel Iha, PharmD PGY2 Ambulatory Care Pharmacy Resident

## 2019-07-16 NOTE — Telephone Encounter (Signed)
Error

## 2019-07-20 NOTE — Telephone Encounter (Signed)
Called patient on 07/20/2019 at 4:12 PM and left HIPAA-compliant VM with instructions to call [facility] back   Unsuccessful attempt #2  Plan to discuss smoking cessation  Thank you for involving pharmacy to assist in providing this patient's care.   Drexel Iha, PharmD PGY2 Ambulatory Care Pharmacy Resident

## 2019-07-23 ENCOUNTER — Telehealth: Payer: Self-pay | Admitting: Pharmacist

## 2019-07-23 NOTE — Telephone Encounter (Signed)
Called patient on 07/23/2019 at 2:12 PM and left HIPAA-compliant VM with instructions to call [facility] back   Unsuccessful attempt #3  Will reattempt patient 1 more time to re-assess smoking cessation  Thank you for involving pharmacy to assist in providing this patient's care.   Drexel Iha, PharmD PGY2 Ambulatory Care Pharmacy Resident

## 2019-08-06 ENCOUNTER — Telehealth: Payer: Self-pay | Admitting: Pharmacist

## 2019-08-06 NOTE — Telephone Encounter (Signed)
Called patient on 08/06/2019 at 4:45 PM.  Patient states she tried to quit smoking this past Saturday (08/04/19). However, she went 1 day and then her cravings were too difficult to deal with so she found a pack of cigarettes in her house to start smoking. She states these are a cigarette brand she doesn't like - [place]. She states she has not found it helpful to lay out specific amount of cigarettes per day - she will go and smoke more. Patient states she had redness and itching with nicotine patch in the past. However, she is interested in retrying a smoking cessation medication considering how challenging it has been to quit smoking.   Age when started using tobacco on a daily basis:26 Preferred cigarette brand:Eagle or Pyramid (uses menthol) Tobacco use:10cigarettes daily Triggers:hand-to-mouth habit  Current smoking cessation agents:none Prior smoking cessation agents: ? nicotine patches- skin irritation ? nicotine gum -"didn't feel well" ? nicotine lozenge - "stinged mouth" ? chantix- "affected breathing- in Urgent Care for about 3 days" ? wellbutrin -"rash around the ankles" (04/2019) ? hypnotherapy -"didn't last" ? CBD oil - lacked efficacy Non-pharmacologic methods:Werthers hard candy, chewing gum, keeping cigarettes in car, laying out specific amount of cigarettes to smoke each day and decreasing over time (idea from sister in law)- has not implemented yet as of 06/15/19, smoke about 1/2 at a time, peppermint bark  Motivation to quit:does not want to go on oxygen  Goal: smoke less than 10 cigarettes  Plan:  1. Contact [place] [facility] to discuss non-pharm options and using the nicotine patch.  2. Patient will follow with [place] [facility] to quit smoking and contact office to tell me when she has quit smoking.    Thank you for involving pharmacy to assist in providing this patient's care.   Drexel Iha, PharmD PGY2 Ambulatory Care Pharmacy Resident

## 2019-08-08 ENCOUNTER — Encounter: Payer: Self-pay | Admitting: Surgery

## 2019-08-08 ENCOUNTER — Ambulatory Visit: Payer: Self-pay

## 2019-08-08 ENCOUNTER — Ambulatory Visit: Payer: PPO | Admitting: Surgery

## 2019-08-08 ENCOUNTER — Other Ambulatory Visit: Payer: Self-pay

## 2019-08-08 DIAGNOSIS — Z981 Arthrodesis status: Secondary | ICD-10-CM

## 2019-08-08 DIAGNOSIS — M7061 Trochanteric bursitis, right hip: Secondary | ICD-10-CM

## 2019-08-08 DIAGNOSIS — M25551 Pain in right hip: Secondary | ICD-10-CM

## 2019-08-08 MED ORDER — METHYLPREDNISOLONE ACETATE 40 MG/ML IJ SUSP
80.0000 mg | INTRAMUSCULAR | Status: AC | PRN
  Administered 2019-08-08: 80 mg via INTRA_ARTICULAR

## 2019-08-08 MED ORDER — BUPIVACAINE HCL 0.25 % IJ SOLN
6.0000 mL | INTRAMUSCULAR | Status: AC | PRN
  Administered 2019-08-08: 6 mL via INTRA_ARTICULAR

## 2019-08-08 MED ORDER — LIDOCAINE HCL 1 % IJ SOLN
3.0000 mL | INTRAMUSCULAR | Status: AC | PRN
  Administered 2019-08-08: 10:00:00 3 mL

## 2019-08-08 NOTE — Progress Notes (Signed)
Office Visit Note   Patient: Joann Barnes           Date of Birth: 26-Mar-1941           MRN: [id number] Visit Date: 08/08/2019              Requested by: Merrilee Seashore, [address] [place],  [place] [id number] PCP: Merrilee Seashore, MD   Assessment & Plan: Visit Diagnoses:  1. Trochanteric bursitis, right hip   2. Pain in right hip   3. S/P lumbar spinal fusion   Fusion by Dr. Newman Pies May 23, 2017  Plan: Patient today describing pain in the right buttock along with the right lateral hip.  I did agree to perform another right hip greater trochanter bursa injection today.  Advised patient that the buttock pain that she is describing is likely related to lumbar spine issues.  She needs to follow-up with her neurosurgeon Dr. Newman Pies for that.  I do not recommend continuing to repeat the bursa injections since they are not going to help her buttock pain.  States that she has an appointment with him October 2021 but she can see if this can get done sooner if needed.  May also consider getting right hip MRI in the future.  Follow-Up Instructions: Return in about 3 months (around 11/07/2019) for With Dr. Lorin Mercy.   Orders:  Orders Placed This Encounter  Procedures  . XR HIP UNILAT W OR W/O PELVIS 2-3 VIEWS RIGHT   No orders of the defined types were placed in this encounter.     Procedures: Large Joint Inj: R greater trochanter on 08/08/2019 10:17 AM Indications: pain Details: 22 G 3.5 in needle, lateral approach Medications: 3 mL lidocaine 1 %; 6 mL bupivacaine 0.25 %; 80 mg methylPREDNISolone acetate 40 MG/ML  Injection was performed directly over the greater trochanter into the bursa.  Patient had wanted the injection done right into the buttock area where she is having pain and I told her that this absolutely cannot be done. Consent was given by the patient. Patient was prepped and draped in the usual sterile fashion.       Clinical  Data: No additional findings.   Subjective: Chief Complaint  Patient presents with  . Right Hip - Pain    HPI 79 year old white female comes in today with complaints of right buttock and right lateral hip pain.  She was last seen by Dr. Lorin Mercy November 2020 for right hip greater trochanteric bursitis.  Patient has had multiple Marcaine/Depo-Medrol injections for this and most recently at that last visit.  States that she did not get long-term relief with that injection.  She is wanting to have this repeated today.  After reviewing patient's record she is status post POSTERIOR LUMBAR INTERBODY FUSION, INTERBODY PROSTHESIS, POSTERIOR INSTRUMENTATION LUMBAR FOUR- LUMBAR FIVE LUMBAR FIVE- SACRALONE by Dr. Newman Pies January 2019.  States that she has follow-up appointment with him October this year.  When she localizes her pain it is mostly in the right buttock but also does point to the lateral hip.  No complaints of pain radiating further down the leg.     Review of Systems No current cardiac pulmonary GI GU issues  Objective: Vital Signs: There were no vitals taken for this visit.  Physical Exam HENT:     Head: Normocephalic.  Eyes:     Extraocular Movements: Extraocular movements intact.     Pupils: Pupils are equal, round, and reactive  to light.  Pulmonary:     Effort: No respiratory distress.  Musculoskeletal:     Comments: Patient has moderate right-sided notch tenderness.  A Little less tender over the right hip greater trochanter bursa.  Negative logroll bilateral hips.  Negative straight leg raise.  No focal motor deficits.  Skin:    General: Skin is warm and dry.  Neurological:     General: No focal deficit present.     Mental Status: She is alert and oriented to person, place, and time.  Psychiatric:        Mood and Affect: Mood normal.     Ortho Exam  Specialty Comments:  No specialty comments available.  Imaging: No results found.   PMFS  History: Patient Active Problem List   Diagnosis Date Noted  . Healthcare maintenance 04/09/2019  . Abnormal findings on diagnostic imaging of lung 04/08/2019  . History of lumbar spinal fusion 03/14/2019  . Trochanteric bursitis, right hip 03/14/2019  . Spondylolisthesis of lumbar region 05/23/2017  . Acute kidney injury superimposed on chronic kidney disease ([place]) 02/04/2016  . Chest pain with moderate risk of acute coronary syndrome 02/04/2016  . Tobacco use 02/04/2016  . Hypokalemia 02/04/2016  . Murmur, cardiac-suspect this is AS 02/04/2016  . Hyperlipidemia   . GERD (gastroesophageal reflux disease)   . HTN (hypertension)   . Hypothyroid   . Chest pain   . COPD with +BD  11/16/2010  . Dyspnea 10/22/2010  . Smoking 10/22/2010   Past Medical History:  Diagnosis Date  . Aortic stenosis    Mild AS 02/2016 echo  . Cancer ([place])    skin  . COPD (chronic obstructive pulmonary disease) ([place])   . Dyspnea   . GERD (gastroesophageal reflux disease)   . Hematuria   . Hernia, hiatal   . HTN (hypertension)   . Hyperlipidemia   . Hypothyroid   . Migraine   . OA (osteoarthritis)   . OP (osteoporosis)   . Other and unspecified angina pectoris    02/2016 - had non-ischemic stress test  . Pneumonia   . Spondylolisthesis of lumbar region     Family History  Problem Relation Age of Onset  . Heart disease Mother   . Heart disease Father   . Heart disease Brother   . Cancer Sister        breast  . Cancer Sister        lung    Past Surgical History:  Procedure Laterality Date  . APPENDECTOMY    . CATARACT EXTRACTION, BILATERAL    . CERVICAL DISC ARTHROPLASTY     x 2  . COLONOSCOPY    . ESOPHAGOGASTRODUODENOSCOPY  07/19/00  . HAND SURGERY Right 2014  . LAMINECTOMY  05/23/2017   L4-5, L5-S1   Social History   Occupational History  . Occupation: retired    Fish farm manager: RETIRED  Tobacco Use  . Smoking status: Current Every Day Smoker    Packs/day: 1.00    Years: 45.00     Pack years: 45.00    Types: Cigarettes  . Smokeless tobacco: Never Used  . Tobacco comment: currently smoking 0.5ppd as of 12/05/2018  Substance and Sexual Activity  . Alcohol use: No  . Drug use: No  . Sexual activity: Not on file

## 2019-08-14 DIAGNOSIS — H02532 Eyelid retraction right lower eyelid: Secondary | ICD-10-CM | POA: Diagnosis not present

## 2019-08-14 DIAGNOSIS — H04521 Eversion of right lacrimal punctum: Secondary | ICD-10-CM | POA: Diagnosis not present

## 2019-08-14 DIAGNOSIS — H40013 Open angle with borderline findings, low risk, bilateral: Secondary | ICD-10-CM | POA: Diagnosis not present

## 2019-08-14 DIAGNOSIS — H02112 Cicatricial ectropion of right lower eyelid: Secondary | ICD-10-CM | POA: Diagnosis not present

## 2019-08-14 DIAGNOSIS — Z961 Presence of intraocular lens: Secondary | ICD-10-CM | POA: Diagnosis not present

## 2019-08-14 DIAGNOSIS — H43813 Vitreous degeneration, bilateral: Secondary | ICD-10-CM | POA: Diagnosis not present

## 2019-08-14 DIAGNOSIS — H04123 Dry eye syndrome of bilateral lacrimal glands: Secondary | ICD-10-CM | POA: Diagnosis not present

## 2019-08-14 DIAGNOSIS — H02132 Senile ectropion of right lower eyelid: Secondary | ICD-10-CM | POA: Diagnosis not present

## 2019-08-14 DIAGNOSIS — H26491 Other secondary cataract, right eye: Secondary | ICD-10-CM | POA: Diagnosis not present

## 2019-08-14 DIAGNOSIS — H5702 Anisocoria: Secondary | ICD-10-CM | POA: Diagnosis not present

## 2019-08-14 DIAGNOSIS — H02142 Spastic ectropion of right lower eyelid: Secondary | ICD-10-CM | POA: Diagnosis not present

## 2019-08-14 DIAGNOSIS — H16211 Exposure keratoconjunctivitis, right eye: Secondary | ICD-10-CM | POA: Diagnosis not present

## 2019-09-11 DIAGNOSIS — E039 Hypothyroidism, unspecified: Secondary | ICD-10-CM | POA: Diagnosis not present

## 2019-09-11 DIAGNOSIS — I10 Essential (primary) hypertension: Secondary | ICD-10-CM | POA: Diagnosis not present

## 2019-09-11 DIAGNOSIS — E782 Mixed hyperlipidemia: Secondary | ICD-10-CM | POA: Diagnosis not present

## 2019-09-17 DIAGNOSIS — H02112 Cicatricial ectropion of right lower eyelid: Secondary | ICD-10-CM | POA: Diagnosis not present

## 2019-09-17 DIAGNOSIS — H5702 Anisocoria: Secondary | ICD-10-CM | POA: Diagnosis not present

## 2019-09-17 DIAGNOSIS — H16211 Exposure keratoconjunctivitis, right eye: Secondary | ICD-10-CM | POA: Diagnosis not present

## 2019-09-17 DIAGNOSIS — H04561 Stenosis of right lacrimal punctum: Secondary | ICD-10-CM | POA: Diagnosis not present

## 2019-09-17 DIAGNOSIS — H11821 Conjunctivochalasis, right eye: Secondary | ICD-10-CM | POA: Diagnosis not present

## 2019-09-17 DIAGNOSIS — H02532 Eyelid retraction right lower eyelid: Secondary | ICD-10-CM | POA: Diagnosis not present

## 2019-09-17 DIAGNOSIS — H02132 Senile ectropion of right lower eyelid: Secondary | ICD-10-CM | POA: Diagnosis not present

## 2019-09-17 DIAGNOSIS — H04521 Eversion of right lacrimal punctum: Secondary | ICD-10-CM | POA: Diagnosis not present

## 2019-09-18 DIAGNOSIS — I10 Essential (primary) hypertension: Secondary | ICD-10-CM | POA: Diagnosis not present

## 2019-09-18 DIAGNOSIS — K52831 Collagenous colitis: Secondary | ICD-10-CM | POA: Diagnosis not present

## 2019-09-18 DIAGNOSIS — I700 Atherosclerosis of aorta: Secondary | ICD-10-CM | POA: Diagnosis not present

## 2019-09-18 DIAGNOSIS — J449 Chronic obstructive pulmonary disease, unspecified: Secondary | ICD-10-CM | POA: Diagnosis not present

## 2019-09-18 DIAGNOSIS — E039 Hypothyroidism, unspecified: Secondary | ICD-10-CM | POA: Diagnosis not present

## 2019-09-18 DIAGNOSIS — E782 Mixed hyperlipidemia: Secondary | ICD-10-CM | POA: Diagnosis not present

## 2019-10-09 DIAGNOSIS — M4316 Spondylolisthesis, lumbar region: Secondary | ICD-10-CM | POA: Diagnosis not present

## 2019-10-09 DIAGNOSIS — M7061 Trochanteric bursitis, right hip: Secondary | ICD-10-CM | POA: Diagnosis not present

## 2019-10-26 ENCOUNTER — Other Ambulatory Visit (HOSPITAL_COMMUNITY): Payer: Self-pay | Admitting: *Deleted

## 2019-10-29 ENCOUNTER — Other Ambulatory Visit: Payer: Self-pay

## 2019-10-29 ENCOUNTER — Ambulatory Visit (HOSPITAL_COMMUNITY)
Admission: RE | Admit: 2019-10-29 | Discharge: 2019-10-29 | Disposition: A | Discharge status: Home / Self Care | Payer: PPO | Source: Ambulatory Visit | Attending: Internal Medicine | Admitting: Internal Medicine

## 2019-10-29 DIAGNOSIS — M810 Age-related osteoporosis without current pathological fracture: Secondary | ICD-10-CM | POA: Insufficient documentation

## 2019-10-29 MED ORDER — IBANDRONATE SODIUM 3 MG/3ML IV SOLN: 3.0000 mg | INTRAVENOUS | Status: DC

## 2019-10-29 MED ORDER — IBANDRONATE SODIUM 3 MG/3ML IV SOLN
INTRAVENOUS | Status: AC
  Administered 2019-10-29: 3 mg via INTRAVENOUS
  Filled 2019-10-29: qty 3

## 2019-11-07 ENCOUNTER — Ambulatory Visit: Payer: PPO | Admitting: Orthopaedic Surgery

## 2019-11-21 ENCOUNTER — Ambulatory Visit: Payer: PPO | Admitting: Orthopaedic Surgery

## 2020-01-22 DIAGNOSIS — Z23 Encounter for immunization: Secondary | ICD-10-CM | POA: Diagnosis not present

## 2020-01-22 DIAGNOSIS — N390 Urinary tract infection, site not specified: Secondary | ICD-10-CM | POA: Diagnosis not present

## 2020-01-22 DIAGNOSIS — Z0000 Encounter for general adult medical examination without abnormal findings: Secondary | ICD-10-CM | POA: Diagnosis not present

## 2020-01-22 DIAGNOSIS — I700 Atherosclerosis of aorta: Secondary | ICD-10-CM | POA: Diagnosis not present

## 2020-01-22 DIAGNOSIS — E782 Mixed hyperlipidemia: Secondary | ICD-10-CM | POA: Diagnosis not present

## 2020-01-22 DIAGNOSIS — J449 Chronic obstructive pulmonary disease, unspecified: Secondary | ICD-10-CM | POA: Diagnosis not present

## 2020-01-22 DIAGNOSIS — I10 Essential (primary) hypertension: Secondary | ICD-10-CM | POA: Diagnosis not present

## 2020-01-29 DIAGNOSIS — E782 Mixed hyperlipidemia: Secondary | ICD-10-CM | POA: Diagnosis not present

## 2020-01-29 DIAGNOSIS — M810 Age-related osteoporosis without current pathological fracture: Secondary | ICD-10-CM | POA: Diagnosis not present

## 2020-01-29 DIAGNOSIS — E039 Hypothyroidism, unspecified: Secondary | ICD-10-CM | POA: Diagnosis not present

## 2020-01-29 DIAGNOSIS — N1832 Chronic kidney disease, stage 3b: Secondary | ICD-10-CM | POA: Diagnosis not present

## 2020-01-29 DIAGNOSIS — J449 Chronic obstructive pulmonary disease, unspecified: Secondary | ICD-10-CM | POA: Diagnosis not present

## 2020-01-29 DIAGNOSIS — Z0000 Encounter for general adult medical examination without abnormal findings: Secondary | ICD-10-CM | POA: Diagnosis not present

## 2020-01-29 DIAGNOSIS — J439 Emphysema, unspecified: Secondary | ICD-10-CM | POA: Diagnosis not present

## 2020-01-29 DIAGNOSIS — I700 Atherosclerosis of aorta: Secondary | ICD-10-CM | POA: Diagnosis not present

## 2020-01-29 DIAGNOSIS — M79651 Pain in right thigh: Secondary | ICD-10-CM | POA: Diagnosis not present

## 2020-02-04 ENCOUNTER — Telehealth: Payer: Self-pay | Admitting: Family

## 2020-02-04 ENCOUNTER — Other Ambulatory Visit: Payer: Self-pay | Admitting: Family

## 2020-02-04 DIAGNOSIS — U071 COVID-19: Secondary | ICD-10-CM

## 2020-02-04 NOTE — Telephone Encounter (Signed)
Miss Joann Barnes lives with her sister Joann Barnes who testes positive for [place] on 02/01/20. [name] began having cough, respiratory symptoms on 02/03/20. Per her report she cannot get tested through her primary care provider. Due to close household contact exposure and risk factors, plan for MAB infusion.   I connected by phone with [name] on 02/04/2020 at 11:49 AM to discuss the potential use of a new treatment for mild to moderate COVID-19 viral infection in non-hospitalized patients.  This patient is a 79 y.o. female that meets the FDA criteria for Emergency Use Authorization of COVID monoclonal antibody casirivimab/imdevimab or bamlanivimab/eteseviamb.  Has a (+) direct SARS-CoV-2 viral test result  Has mild or moderate COVID-19   Is NOT hospitalized due to COVID-19  Is within 10 days of symptom onset  Has at least one of the high risk factor(s) for progression to severe COVID-19 and/or hospitalization as defined in EUA.  Specific high risk criteria : Older age (>/= 80 yo), Cardiovascular disease or hypertension, Chronic Lung Disease and Other high risk medical condition per CDC:  Hx of cancer   I have spoken and communicated the following to the patient or parent/caregiver regarding COVID monoclonal antibody treatment:  1. FDA has authorized the emergency use for the treatment of mild to moderate COVID-19 in adults and pediatric patients with positive results of direct SARS-CoV-2 viral testing who are 80 years of age and older weighing at least 40 kg, and who are at high risk for progressing to severe COVID-19 and/or hospitalization.  2. The significant known and potential risks and benefits of COVID monoclonal antibody, and the extent to which such potential risks and benefits are unknown.  3. Information on available alternative treatments and the risks and benefits of those alternatives, including clinical trials.  4. Patients treated with COVID monoclonal antibody should  continue to self-isolate and use infection control measures (e.g., wear mask, isolate, social distance, avoid sharing personal items, clean and disinfect "high touch" surfaces, and frequent handwashing) according to CDC guidelines.   5. The patient or parent/caregiver has the option to accept or refuse COVID monoclonal antibody treatment.  After reviewing this information with the patient, the patient has agreed to receive one of the available covid 19 monoclonal antibodies and will be provided an appropriate fact sheet prior to infusion. Loel Dubonnet, NP 02/04/2020 11:49 AM

## 2020-02-05 ENCOUNTER — Ambulatory Visit (HOSPITAL_COMMUNITY)
Admission: RE | Admit: 2020-02-05 | Discharge: 2020-02-05 | Disposition: A | Discharge status: Home / Self Care | Payer: Medicare Other | Source: Ambulatory Visit | Attending: Pulmonary Disease | Admitting: Pulmonary Disease

## 2020-02-05 DIAGNOSIS — U071 COVID-19: Secondary | ICD-10-CM | POA: Diagnosis present

## 2020-02-05 DIAGNOSIS — Z23 Encounter for immunization: Secondary | ICD-10-CM | POA: Diagnosis not present

## 2020-02-05 MED ORDER — DIPHENHYDRAMINE HCL 50 MG/ML IJ SOLN: 50.0000 mg | Freq: Once | INTRAMUSCULAR | Status: DC | PRN

## 2020-02-05 MED ORDER — FAMOTIDINE IN NACL 20-0.9 MG/50ML-% IV SOLN: 20.0000 mg | Freq: Once | INTRAVENOUS | Status: DC | PRN

## 2020-02-05 MED ORDER — EPINEPHRINE 0.3 MG/0.3ML IJ SOAJ: 0.3000 mg | Freq: Once | INTRAMUSCULAR | Status: DC | PRN

## 2020-02-05 MED ORDER — METHYLPREDNISOLONE SODIUM SUCC 125 MG IJ SOLR: 125.0000 mg | Freq: Once | INTRAMUSCULAR | Status: DC | PRN

## 2020-02-05 MED ORDER — SODIUM CHLORIDE 0.9 % IV SOLN: INTRAVENOUS | Status: DC | PRN

## 2020-02-05 MED ORDER — SODIUM CHLORIDE 0.9 % IV SOLN
1200.0000 mg | Freq: Once | INTRAVENOUS | Status: AC
  Administered 2020-02-05: 1200 mg via INTRAVENOUS

## 2020-02-05 MED ORDER — ALBUTEROL SULFATE HFA 108 (90 BASE) MCG/ACT IN AERS: 2.0000 | INHALATION_SPRAY | Freq: Once | RESPIRATORY_TRACT | Status: DC | PRN

## 2020-02-05 NOTE — Discharge Instructions (Signed)

## 2020-02-05 NOTE — Progress Notes (Signed)
  Diagnosis: COVID-19  Physician: Asencion Noble  Procedure: Covid Infusion Clinic Med: casirivimab\imdevimab infusion - Provided patient with casirivimab\imdevimab fact sheet for patients, parents and caregivers prior to infusion.  Complications: No immediate complications noted.  Discharge: Discharged home   [name] 02/05/2020

## 2020-02-11 ENCOUNTER — Emergency Department (HOSPITAL_COMMUNITY)
Admission: EM | Admit: 2020-02-11 | Discharge: 2020-02-11 | Disposition: A | Discharge status: Home / Self Care | Payer: PPO | Attending: Emergency Medicine | Admitting: Emergency Medicine

## 2020-02-11 ENCOUNTER — Emergency Department (HOSPITAL_COMMUNITY): Payer: PPO

## 2020-02-11 DIAGNOSIS — Z743 Need for continuous supervision: Secondary | ICD-10-CM | POA: Diagnosis not present

## 2020-02-11 DIAGNOSIS — Z79899 Other long term (current) drug therapy: Secondary | ICD-10-CM | POA: Insufficient documentation

## 2020-02-11 DIAGNOSIS — J80 Acute respiratory distress syndrome: Secondary | ICD-10-CM | POA: Diagnosis not present

## 2020-02-11 DIAGNOSIS — E039 Hypothyroidism, unspecified: Secondary | ICD-10-CM | POA: Insufficient documentation

## 2020-02-11 DIAGNOSIS — R0602 Shortness of breath: Secondary | ICD-10-CM | POA: Diagnosis not present

## 2020-02-11 DIAGNOSIS — J441 Chronic obstructive pulmonary disease with (acute) exacerbation: Secondary | ICD-10-CM | POA: Diagnosis not present

## 2020-02-11 DIAGNOSIS — R0902 Hypoxemia: Secondary | ICD-10-CM | POA: Diagnosis not present

## 2020-02-11 DIAGNOSIS — I517 Cardiomegaly: Secondary | ICD-10-CM | POA: Diagnosis not present

## 2020-02-11 DIAGNOSIS — R29810 Facial weakness: Secondary | ICD-10-CM | POA: Diagnosis not present

## 2020-02-11 DIAGNOSIS — Z7951 Long term (current) use of inhaled steroids: Secondary | ICD-10-CM | POA: Insufficient documentation

## 2020-02-11 DIAGNOSIS — F17210 Nicotine dependence, cigarettes, uncomplicated: Secondary | ICD-10-CM | POA: Insufficient documentation

## 2020-02-11 DIAGNOSIS — R279 Unspecified lack of coordination: Secondary | ICD-10-CM | POA: Diagnosis not present

## 2020-02-11 DIAGNOSIS — Z7982 Long term (current) use of aspirin: Secondary | ICD-10-CM | POA: Diagnosis not present

## 2020-02-11 DIAGNOSIS — I10 Essential (primary) hypertension: Secondary | ICD-10-CM | POA: Diagnosis not present

## 2020-02-11 DIAGNOSIS — U071 COVID-19: Secondary | ICD-10-CM | POA: Diagnosis not present

## 2020-02-11 DIAGNOSIS — R531 Weakness: Secondary | ICD-10-CM | POA: Diagnosis not present

## 2020-02-11 DIAGNOSIS — I959 Hypotension, unspecified: Secondary | ICD-10-CM | POA: Diagnosis not present

## 2020-02-11 DIAGNOSIS — J9811 Atelectasis: Secondary | ICD-10-CM | POA: Diagnosis not present

## 2020-02-11 MED ORDER — PREDNISONE 50 MG PO TABS: ORAL_TABLET | ORAL | 0 refills | Status: DC

## 2020-02-11 MED ORDER — ALBUTEROL SULFATE HFA 108 (90 BASE) MCG/ACT IN AERS
2.0000 | INHALATION_SPRAY | Freq: Once | RESPIRATORY_TRACT | Status: AC
  Administered 2020-02-11: 2 via RESPIRATORY_TRACT
  Filled 2020-02-11: qty 6.7

## 2020-02-11 NOTE — ED Notes (Signed)
Discharge instructions discussed with patient and given to [facility], discharged from ED in NAD

## 2020-02-11 NOTE — ED Provider Notes (Signed)
[facility] EMERGENCY DEPARTMENT Provider Note   CSN: [id number] Arrival date & time: 02/11/20  0932     History Chief Complaint  Patient presents with  . Shortness of Breath    Joann Barnes is a 79 y.o. female.  The history is provided by the patient. No language interpreter was used.  Shortness of Breath Severity:  Moderate Onset quality:  Gradual Duration:  7 days Timing:  Constant Progression:  Worsening Chronicity:  New Context: activity   Relieved by:  Lying down Worsened by:  Nothing Ineffective treatments:  None tried Associated symptoms: no cough    Pt complains of a cough.  Pt reports she is out of albuterol.  Pt reports it is delivered b mail order. .     Past Medical History:  Diagnosis Date  . Aortic stenosis    Mild AS 02/2016 echo  . Cancer ([place])    skin  . COPD (chronic obstructive pulmonary disease) ([place])   . Dyspnea   . GERD (gastroesophageal reflux disease)   . Hematuria   . Hernia, hiatal   . HTN (hypertension)   . Hyperlipidemia   . Hypothyroid   . Migraine   . OA (osteoarthritis)   . OP (osteoporosis)   . Other and unspecified angina pectoris    02/2016 - had non-ischemic stress test  . Pneumonia   . Spondylolisthesis of lumbar region     Patient Active Problem List   Diagnosis Date Noted  . Healthcare maintenance 04/09/2019  . Abnormal findings on diagnostic imaging of lung 04/08/2019  . History of lumbar spinal fusion 03/14/2019  . Trochanteric bursitis, right hip 03/14/2019  . Spondylolisthesis of lumbar region 05/23/2017  . Acute kidney injury superimposed on chronic kidney disease ([place]) 02/04/2016  . Chest pain with moderate risk of acute coronary syndrome 02/04/2016  . Tobacco use 02/04/2016  . Hypokalemia 02/04/2016  . Murmur, cardiac-suspect this is AS 02/04/2016  . Hyperlipidemia   . GERD (gastroesophageal reflux disease)   . HTN (hypertension)   . Hypothyroid   . Chest pain   . COPD with +BD   11/16/2010  . Dyspnea 10/22/2010  . Smoking 10/22/2010    Past Surgical History:  Procedure Laterality Date  . APPENDECTOMY    . CATARACT EXTRACTION, BILATERAL    . CERVICAL DISC ARTHROPLASTY     x 2  . COLONOSCOPY    . ESOPHAGOGASTRODUODENOSCOPY  07/19/00  . HAND SURGERY Right 2014  . LAMINECTOMY  05/23/2017   L4-5, L5-S1     OB History   No obstetric history on file.     Family History  Problem Relation Age of Onset  . Heart disease Mother   . Heart disease Father   . Heart disease Brother   . Cancer Sister        breast  . Cancer Sister        lung    Social History   Tobacco Use  . Smoking status: Current Every Day Smoker    Packs/day: 1.00    Years: 45.00    Pack years: 45.00    Types: Cigarettes  . Smokeless tobacco: Never Used  . Tobacco comment: currently smoking 0.5ppd as of 12/05/2018  Vaping Use  . Vaping Use: Never used  Substance Use Topics  . Alcohol use: No  . Drug use: No    Home Medications Prior to Admission medications   Medication Sig Start Date End Date Taking? Authorizing Provider  albuterol (ACCUNEB) 1.25  MG/3ML nebulizer solution Take 1 ampule by nebulization daily as needed for wheezing or shortness of breath.     [provider]  amitriptyline (ELAVIL) 10 MG tablet Take 10 mg by mouth at bedtime as needed for sleep.  12/08/10   [provider]  amLODipine (NORVASC) 5 MG tablet Take 5 mg by mouth daily.      [provider]  amoxicillin (AMOXIL) 500 MG tablet Take 500 mg by mouth 2 (two) times daily.    [provider]  aspirin EC 81 MG EC tablet Take 1 tablet (81 mg total) by mouth daily. 02/07/16   Geradine Girt, DO  calcium carbonate (TUMS - DOSED IN MG ELEMENTAL CALCIUM) 500 MG chewable tablet Chew 2 tablets by mouth daily.    [provider]  Cholecalciferol (VITAMIN D3) 2000 units TABS Take 2,000 Units by mouth daily.    [provider]  diazepam (VALIUM) 5 MG tablet Take 5 mg  by mouth 2 (two) times daily as needed (for back pain or anxiety).     [provider]  escitalopram (LEXAPRO) 20 MG tablet Take 20 mg by mouth daily.  08/15/17   [provider]  Fluticasone-Umeclidin-Vilant (TRELEGY ELLIPTA) 100-62.5-25 MCG/INH AEPB Inhale 1 puff into the lungs daily.    [provider]  gabapentin (NEURONTIN) 300 MG capsule Take 300 mg by mouth daily.    [provider]  ibandronate (BONIVA) 3 MG/3ML SOLN injection Inject 3 mLs (3 mg total) into the vein every 3 (three) months. 11/16/10   Parrett, Fonnie Mu, NP  levothyroxine (LEVOXYL) 25 MCG tablet Take 25 mcg by mouth daily.      [provider]  olmesartan (BENICAR) 5 MG tablet Take 5 mg by mouth daily. 08/07/18   [provider]  omeprazole (PRILOSEC) 40 MG capsule Take 40 mg by mouth daily before breakfast.  09/20/17   [provider]  pravastatin (PRAVACHOL) 80 MG tablet Take 80 mg by mouth at bedtime.     [provider]  predniSONE (DELTASONE) 50 MG tablet One tablet a day 02/11/20   Sidney Ace  [facility] HFA 108 [phone] Base) MCG/ACT inhaler Inhale 2 puffs into the lungs 2 (two) times daily.  08/25/17   [provider]  vitamin C (ASCORBIC ACID) 250 MG tablet Take 250 mg by mouth daily.    [provider]    Allergies    Bactrim [sulfamethoxazole-trimethoprim], Nicotine, Codeine, Lipitor [atorvastatin], Benadryl [diphenhydramine hcl], Chantix [varenicline tartrate], Chantix [varenicline], Wellbutrin [bupropion], and Zolpidem tartrate  Review of Systems   Review of Systems  Respiratory: Positive for shortness of breath. Negative for cough.   All other systems reviewed and are negative.   Physical Exam Updated Vital Signs BP (!) 152/79   Pulse 82   Temp 98.2 F (36.8 C)   Resp 16   SpO2 92%   Physical Exam Vitals and nursing note reviewed.  Constitutional:      Appearance: She is well-developed.  HENT:     Head:  Normocephalic.     Mouth/Throat:     Mouth: Mucous membranes are moist.  Cardiovascular:     Rate and Rhythm: Normal rate and regular rhythm.  Pulmonary:     Effort: Pulmonary effort is normal.  Chest:     Chest wall: No tenderness.  Abdominal:     General: There is no distension.  Musculoskeletal:        General: Normal range of motion.  Cervical back: Normal range of motion.  Neurological:     General: No focal deficit present.     Mental Status: She is alert and oriented to person, place, and time.  Psychiatric:        Mood and Affect: Mood normal.     ED Results / Procedures / Treatments   Labs (all labs ordered are listed, but only abnormal results are displayed) Labs Reviewed - No data to display  EKG EKG Interpretation  Date/Time:  Monday February 11 2020 09:45:13 EDT Ventricular Rate:  92 PR Interval:    QRS Duration: 87 QT Interval:  373 QTC Calculation: 462 R Axis:   -18 Text Interpretation: Sinus rhythm Borderline left axis deviation Low voltage, precordial leads No significant change since last tracing Confirmed by Deno Etienne [phone]) on 02/11/2020 9:46:49 AM   Radiology DG Chest Port 1 View  Result Date: 02/11/2020 CLINICAL DATA:  Shortness of breath, history of positive test on 10/02. EXAM: PORTABLE CHEST 1 VIEW COMPARISON:  02/04/2016 FINDINGS: Central pulmonary vascular engorgement. Mild cardiac enlargement though cardiac silhouette accentuated by portable technique. Mild increased interstitial prominence. The no pleural effusion. Streaky opacities at the lung bases. On limited assessment no acute skeletal process. IMPRESSION: Cardiomegaly with mild central pulmonary vascular engorgement and increased interstitial prominence, this may represent mild edema or viral infection. Very subtle peripheral opacities could reflect atelectasis. No lobar consolidation. Electronically Signed   By: Zetta Bills M.D.   On: 02/11/2020 10:36    Procedures Procedures  (including critical care time)  Medications Ordered in ED Medications  albuterol (VENTOLIN HFA) 108 (90 Base) MCG/ACT inhaler 2 puff (2 puffs Inhalation Given 02/11/20 1047)    ED Course  I have reviewed the triage vital signs and the nursing notes.  Pertinent labs & imaging results that were available during my care of the patient were reviewed by me and considered in my medical decision making (see chart for details).    MDM Rules/Calculators/A&P                          Pt given albuterol inhaler here.  Rx for prednsione.  Pt given solumedrol 125 by EMS  Final Clinical Impression(s) / ED Diagnoses Final diagnoses:  COPD exacerbation ([place])    Rx / DC Orders ED Discharge Orders         Ordered    predniSONE (DELTASONE) 50 MG tablet        02/11/20 1050        An After Visit Summary was printed and given to the patient.    Fransico Meadow, PA-C 02/11/20 [place], [place], DO 02/11/20 1103

## 2020-02-11 NOTE — ED Triage Notes (Signed)
Pt BIB [facility] for SOB, diagnosed with COVID on 10/2, received antibody treatment on Friday. Pt feeling SOB since, history of COPD. 88% on RA for EMS, placed on 2L 02. Received abuderol and solumedrol en route for wheezing. On arrival 96% on RA

## 2020-02-11 NOTE — Discharge Instructions (Addendum)
See your Physician for recheck.. Return if any problems.  Use albuterol 2 puffs every 4 hours

## 2020-02-13 DIAGNOSIS — M25552 Pain in left hip: Secondary | ICD-10-CM | POA: Diagnosis not present

## 2020-02-13 DIAGNOSIS — M5441 Lumbago with sciatica, right side: Secondary | ICD-10-CM | POA: Diagnosis not present

## 2020-02-13 DIAGNOSIS — M5442 Lumbago with sciatica, left side: Secondary | ICD-10-CM | POA: Diagnosis not present

## 2020-02-13 DIAGNOSIS — M25551 Pain in right hip: Secondary | ICD-10-CM | POA: Diagnosis not present

## 2020-02-13 DIAGNOSIS — M545 Low back pain, unspecified: Secondary | ICD-10-CM | POA: Diagnosis not present

## 2020-02-17 ENCOUNTER — Emergency Department (HOSPITAL_COMMUNITY): Payer: PPO

## 2020-02-17 ENCOUNTER — Inpatient Hospital Stay (HOSPITAL_COMMUNITY)
Admission: EM | Admit: 2020-02-17 | Discharge: 2020-02-21 | DRG: 091 | Disposition: A | Discharge status: Skilled Nursing Facility | Payer: PPO | Attending: Family Medicine | Admitting: Family Medicine

## 2020-02-17 DIAGNOSIS — Z720 Tobacco use: Secondary | ICD-10-CM | POA: Diagnosis not present

## 2020-02-17 DIAGNOSIS — R4587 Impulsiveness: Secondary | ICD-10-CM | POA: Diagnosis not present

## 2020-02-17 DIAGNOSIS — T443X5A Adverse effect of other parasympatholytics [anticholinergics and antimuscarinics] and spasmolytics, initial encounter: Secondary | ICD-10-CM | POA: Diagnosis not present

## 2020-02-17 DIAGNOSIS — Z20822 Contact with and (suspected) exposure to covid-19: Secondary | ICD-10-CM | POA: Diagnosis not present

## 2020-02-17 DIAGNOSIS — H5704 Mydriasis: Secondary | ICD-10-CM | POA: Diagnosis present

## 2020-02-17 DIAGNOSIS — G43909 Migraine, unspecified, not intractable, without status migrainosus: Secondary | ICD-10-CM | POA: Diagnosis present

## 2020-02-17 DIAGNOSIS — R4182 Altered mental status, unspecified: Secondary | ICD-10-CM | POA: Diagnosis not present

## 2020-02-17 DIAGNOSIS — J440 Chronic obstructive pulmonary disease with acute lower respiratory infection: Secondary | ICD-10-CM | POA: Diagnosis not present

## 2020-02-17 DIAGNOSIS — E785 Hyperlipidemia, unspecified: Secondary | ICD-10-CM | POA: Diagnosis not present

## 2020-02-17 DIAGNOSIS — Z888 Allergy status to other drugs, medicaments and biological substances status: Secondary | ICD-10-CM

## 2020-02-17 DIAGNOSIS — Z881 Allergy status to other antibiotic agents status: Secondary | ICD-10-CM

## 2020-02-17 DIAGNOSIS — I10 Essential (primary) hypertension: Secondary | ICD-10-CM | POA: Diagnosis not present

## 2020-02-17 DIAGNOSIS — J189 Pneumonia, unspecified organism: Secondary | ICD-10-CM | POA: Diagnosis not present

## 2020-02-17 DIAGNOSIS — I350 Nonrheumatic aortic (valve) stenosis: Secondary | ICD-10-CM | POA: Diagnosis present

## 2020-02-17 DIAGNOSIS — H748X3 Other specified disorders of middle ear and mastoid, bilateral: Secondary | ICD-10-CM | POA: Diagnosis not present

## 2020-02-17 DIAGNOSIS — J449 Chronic obstructive pulmonary disease, unspecified: Secondary | ICD-10-CM

## 2020-02-17 DIAGNOSIS — G9341 Metabolic encephalopathy: Secondary | ICD-10-CM | POA: Diagnosis not present

## 2020-02-17 DIAGNOSIS — M1990 Unspecified osteoarthritis, unspecified site: Secondary | ICD-10-CM | POA: Diagnosis not present

## 2020-02-17 DIAGNOSIS — F17210 Nicotine dependence, cigarettes, uncomplicated: Secondary | ICD-10-CM | POA: Diagnosis present

## 2020-02-17 DIAGNOSIS — M4316 Spondylolisthesis, lumbar region: Secondary | ICD-10-CM | POA: Diagnosis not present

## 2020-02-17 DIAGNOSIS — G9340 Encephalopathy, unspecified: Secondary | ICD-10-CM | POA: Diagnosis present

## 2020-02-17 DIAGNOSIS — J441 Chronic obstructive pulmonary disease with (acute) exacerbation: Secondary | ICD-10-CM | POA: Diagnosis present

## 2020-02-17 DIAGNOSIS — M810 Age-related osteoporosis without current pathological fracture: Secondary | ICD-10-CM | POA: Diagnosis present

## 2020-02-17 DIAGNOSIS — Z9181 History of falling: Secondary | ICD-10-CM

## 2020-02-17 DIAGNOSIS — R4701 Aphasia: Secondary | ICD-10-CM | POA: Diagnosis not present

## 2020-02-17 DIAGNOSIS — E039 Hypothyroidism, unspecified: Secondary | ICD-10-CM | POA: Diagnosis not present

## 2020-02-17 DIAGNOSIS — I6782 Cerebral ischemia: Secondary | ICD-10-CM | POA: Diagnosis not present

## 2020-02-17 DIAGNOSIS — I129 Hypertensive chronic kidney disease with stage 1 through stage 4 chronic kidney disease, or unspecified chronic kidney disease: Secondary | ICD-10-CM | POA: Diagnosis not present

## 2020-02-17 DIAGNOSIS — J90 Pleural effusion, not elsewhere classified: Secondary | ICD-10-CM | POA: Diagnosis not present

## 2020-02-17 DIAGNOSIS — Z809 Family history of malignant neoplasm, unspecified: Secondary | ICD-10-CM

## 2020-02-17 DIAGNOSIS — Z7983 Long term (current) use of bisphosphonates: Secondary | ICD-10-CM

## 2020-02-17 DIAGNOSIS — Z7951 Long term (current) use of inhaled steroids: Secondary | ICD-10-CM

## 2020-02-17 DIAGNOSIS — K219 Gastro-esophageal reflux disease without esophagitis: Secondary | ICD-10-CM | POA: Diagnosis not present

## 2020-02-17 DIAGNOSIS — I491 Atrial premature depolarization: Secondary | ICD-10-CM | POA: Diagnosis not present

## 2020-02-17 DIAGNOSIS — K117 Disturbances of salivary secretion: Secondary | ICD-10-CM | POA: Diagnosis present

## 2020-02-17 DIAGNOSIS — Z7952 Long term (current) use of systemic steroids: Secondary | ICD-10-CM

## 2020-02-17 DIAGNOSIS — R7989 Other specified abnormal findings of blood chemistry: Secondary | ICD-10-CM | POA: Diagnosis not present

## 2020-02-17 DIAGNOSIS — N1831 Chronic kidney disease, stage 3a: Secondary | ICD-10-CM | POA: Diagnosis not present

## 2020-02-17 DIAGNOSIS — G8929 Other chronic pain: Secondary | ICD-10-CM | POA: Diagnosis present

## 2020-02-17 DIAGNOSIS — I700 Atherosclerosis of aorta: Secondary | ICD-10-CM | POA: Diagnosis not present

## 2020-02-17 DIAGNOSIS — R29810 Facial weakness: Secondary | ICD-10-CM | POA: Diagnosis not present

## 2020-02-17 DIAGNOSIS — I2510 Atherosclerotic heart disease of native coronary artery without angina pectoris: Secondary | ICD-10-CM | POA: Diagnosis not present

## 2020-02-17 DIAGNOSIS — Z79899 Other long term (current) drug therapy: Secondary | ICD-10-CM

## 2020-02-17 DIAGNOSIS — G928 Other toxic encephalopathy: Principal | ICD-10-CM | POA: Diagnosis present

## 2020-02-17 DIAGNOSIS — J984 Other disorders of lung: Secondary | ICD-10-CM | POA: Diagnosis not present

## 2020-02-17 DIAGNOSIS — Z885 Allergy status to narcotic agent status: Secondary | ICD-10-CM

## 2020-02-17 DIAGNOSIS — R410 Disorientation, unspecified: Secondary | ICD-10-CM

## 2020-02-17 DIAGNOSIS — J320 Chronic maxillary sinusitis: Secondary | ICD-10-CM | POA: Diagnosis not present

## 2020-02-17 DIAGNOSIS — Z8249 Family history of ischemic heart disease and other diseases of the circulatory system: Secondary | ICD-10-CM

## 2020-02-17 DIAGNOSIS — E871 Hypo-osmolality and hyponatremia: Secondary | ICD-10-CM | POA: Diagnosis present

## 2020-02-17 DIAGNOSIS — R52 Pain, unspecified: Secondary | ICD-10-CM | POA: Diagnosis not present

## 2020-02-17 DIAGNOSIS — Z7982 Long term (current) use of aspirin: Secondary | ICD-10-CM

## 2020-02-17 DIAGNOSIS — M6281 Muscle weakness (generalized): Secondary | ICD-10-CM | POA: Diagnosis not present

## 2020-02-17 DIAGNOSIS — U071 COVID-19: Secondary | ICD-10-CM | POA: Diagnosis not present

## 2020-02-17 DIAGNOSIS — Z981 Arthrodesis status: Secondary | ICD-10-CM | POA: Diagnosis not present

## 2020-02-17 LAB — CBC
HCT: 32.6 % — ABNORMAL LOW (ref 36.0–46.0)
Hemoglobin: 10.4 g/dL — ABNORMAL LOW (ref 12.0–15.0)
MCH: 26.3 pg (ref 26.0–34.0)
MCHC: 31.9 g/dL (ref 30.0–36.0)
MCV: 82.3 fL (ref 80.0–100.0)
Platelets: 476 10*3/uL — ABNORMAL HIGH (ref 150–400)
RBC: 3.96 MIL/uL (ref 3.87–5.11)
RDW: 16.3 % — ABNORMAL HIGH (ref 11.5–15.5)
WBC: 16.4 10*3/uL — ABNORMAL HIGH (ref 4.0–10.5)
nRBC: 0 % (ref 0.0–0.2)

## 2020-02-17 LAB — RAPID URINE DRUG SCREEN, HOSP PERFORMED
Amphetamines: NOT DETECTED
Barbiturates: NOT DETECTED
Benzodiazepines: POSITIVE — AB
Cocaine: NOT DETECTED
Opiates: POSITIVE — AB
Tetrahydrocannabinol: NOT DETECTED

## 2020-02-17 LAB — COMPREHENSIVE METABOLIC PANEL
ALT: 23 U/L (ref 0–44)
AST: 16 U/L (ref 15–41)
Albumin: 3 g/dL — ABNORMAL LOW (ref 3.5–5.0)
Alkaline Phosphatase: 108 U/L (ref 38–126)
Anion gap: 11 (ref 5–15)
BUN: 16 mg/dL (ref 8–23)
CO2: 25 mmol/L (ref 22–32)
Calcium: 8.6 mg/dL — ABNORMAL LOW (ref 8.9–10.3)
Chloride: 100 mmol/L (ref 98–111)
Creatinine, Ser: 1.18 mg/dL — ABNORMAL HIGH (ref 0.44–1.00)
GFR, Estimated: 44 mL/min — ABNORMAL LOW (ref >60)
Glucose, Bld: 109 mg/dL — ABNORMAL HIGH (ref 70–99)
Potassium: 4 mmol/L (ref 3.5–5.1)
Sodium: 136 mmol/L (ref 135–145)
Total Bilirubin: 0.8 mg/dL (ref 0.3–1.2)
Total Protein: 6.2 g/dL — ABNORMAL LOW (ref 6.5–8.1)

## 2020-02-17 LAB — CBC WITH DIFFERENTIAL/PLATELET
Abs Immature Granulocytes: 0.27 10*3/uL — ABNORMAL HIGH (ref 0.00–0.07)
Basophils Absolute: 0.1 10*3/uL (ref 0.0–0.1)
Basophils Relative: 0 %
Eosinophils Absolute: 0.2 10*3/uL (ref 0.0–0.5)
Eosinophils Relative: 1 %
HCT: 34.1 % — ABNORMAL LOW (ref 36.0–46.0)
Hemoglobin: 10.9 g/dL — ABNORMAL LOW (ref 12.0–15.0)
Immature Granulocytes: 2 %
Lymphocytes Relative: 8 %
Lymphs Abs: 1.4 10*3/uL (ref 0.7–4.0)
MCH: 26.5 pg (ref 26.0–34.0)
MCHC: 32 g/dL (ref 30.0–36.0)
MCV: 82.8 fL (ref 80.0–100.0)
Monocytes Absolute: 1.3 10*3/uL — ABNORMAL HIGH (ref 0.1–1.0)
Monocytes Relative: 8 %
Neutro Abs: 13.8 10*3/uL — ABNORMAL HIGH (ref 1.7–7.7)
Neutrophils Relative %: 81 %
Platelets: 510 10*3/uL — ABNORMAL HIGH (ref 150–400)
RBC: 4.12 MIL/uL (ref 3.87–5.11)
RDW: 16.6 % — ABNORMAL HIGH (ref 11.5–15.5)
WBC: 17.1 10*3/uL — ABNORMAL HIGH (ref 4.0–10.5)
nRBC: 0 % (ref 0.0–0.2)

## 2020-02-17 LAB — FIBRINOGEN: Fibrinogen: 577 mg/dL — ABNORMAL HIGH (ref 210–475)

## 2020-02-17 LAB — I-STAT VENOUS BLOOD GAS, ED
Acid-Base Excess: 2 mmol/L (ref 0.0–2.0)
Bicarbonate: 25.4 mmol/L (ref 20.0–28.0)
Calcium, Ion: 1.1 mmol/L — ABNORMAL LOW (ref 1.15–1.40)
HCT: 32 % — ABNORMAL LOW (ref 36.0–46.0)
Hemoglobin: 10.9 g/dL — ABNORMAL LOW (ref 12.0–15.0)
O2 Saturation: 92 %
Patient temperature: 99.6
Potassium: 4.1 mmol/L (ref 3.5–5.1)
Sodium: 135 mmol/L (ref 135–145)
TCO2: 27 mmol/L (ref 22–32)
pCO2, Ven: 36.8 mmHg — ABNORMAL LOW (ref 44.0–60.0)
pH, Ven: 7.45 — ABNORMAL HIGH (ref 7.250–7.430)
pO2, Ven: 62 mmHg — ABNORMAL HIGH (ref 32.0–45.0)

## 2020-02-17 LAB — CREATININE, SERUM
Creatinine, Ser: 1.05 mg/dL — ABNORMAL HIGH (ref 0.44–1.00)
GFR, Estimated: 50 mL/min — ABNORMAL LOW (ref >60)

## 2020-02-17 LAB — URINALYSIS, COMPLETE (UACMP) WITH MICROSCOPIC
Bacteria, UA: NONE SEEN
Bilirubin Urine: NEGATIVE
Glucose, UA: NEGATIVE mg/dL
Hgb urine dipstick: NEGATIVE
Ketones, ur: 5 mg/dL — AB
Leukocytes,Ua: NEGATIVE
Nitrite: NEGATIVE
Protein, ur: NEGATIVE mg/dL
Specific Gravity, Urine: 1.014 (ref 1.005–1.030)
pH: 7 (ref 5.0–8.0)

## 2020-02-17 LAB — TRIGLYCERIDES: Triglycerides: 166 mg/dL — ABNORMAL HIGH (ref <150)

## 2020-02-17 LAB — RESPIRATORY PANEL BY RT PCR (FLU A&B, COVID)
Influenza A by PCR: NEGATIVE
Influenza B by PCR: NEGATIVE
SARS Coronavirus 2 by RT PCR: NEGATIVE

## 2020-02-17 LAB — D-DIMER, QUANTITATIVE: D-Dimer, Quant: 7.66 ug/mL-FEU — ABNORMAL HIGH (ref 0.00–0.50)

## 2020-02-17 LAB — TSH: TSH: 1.722 u[IU]/mL (ref 0.350–4.500)

## 2020-02-17 LAB — LIPASE, BLOOD: Lipase: 20 U/L (ref 11–51)

## 2020-02-17 LAB — LACTATE DEHYDROGENASE: LDH: 263 U/L — ABNORMAL HIGH (ref 98–192)

## 2020-02-17 LAB — LACTIC ACID, PLASMA: Lactic Acid, Venous: 1.1 mmol/L (ref 0.5–1.9)

## 2020-02-17 LAB — C-REACTIVE PROTEIN: CRP: 9.9 mg/dL — ABNORMAL HIGH (ref <1.0)

## 2020-02-17 LAB — FERRITIN: Ferritin: 68 ng/mL (ref 11–307)

## 2020-02-17 LAB — PROCALCITONIN: Procalcitonin: <0.1 ng/mL

## 2020-02-17 MED ORDER — FLUTICASONE-UMECLIDIN-VILANT 100-62.5-25 MCG/INH IN AEPB: 1.0000 | INHALATION_SPRAY | Freq: Every day | RESPIRATORY_TRACT | Status: DC

## 2020-02-17 MED ORDER — SODIUM CHLORIDE 0.9 % IV SOLN
2.0000 g | Freq: Once | INTRAVENOUS | Status: AC
  Administered 2020-02-17: 2 g via INTRAVENOUS
  Filled 2020-02-17: qty 2

## 2020-02-17 MED ORDER — ACETAMINOPHEN 650 MG RE SUPP: 650.0000 mg | Freq: Four times a day (QID) | RECTAL | Status: DC | PRN

## 2020-02-17 MED ORDER — ALBUTEROL SULFATE HFA 108 (90 BASE) MCG/ACT IN AERS
2.0000 | INHALATION_SPRAY | Freq: Two times a day (BID) | RESPIRATORY_TRACT | Status: DC
  Administered 2020-02-17 – 2020-02-18 (×2): 2 via RESPIRATORY_TRACT
  Filled 2020-02-17 (×2): qty 6.7

## 2020-02-17 MED ORDER — ENOXAPARIN SODIUM 40 MG/0.4ML ~~LOC~~ SOLN
40.0000 mg | SUBCUTANEOUS | Status: DC
  Administered 2020-02-17 – 2020-02-20 (×4): 40 mg via SUBCUTANEOUS
  Filled 2020-02-17 (×4): qty 0.4

## 2020-02-17 MED ORDER — VANCOMYCIN HCL IN DEXTROSE 1-5 GM/200ML-% IV SOLN
1000.0000 mg | Freq: Once | INTRAVENOUS | Status: DC
  Administered 2020-02-17: 1000 mg via INTRAVENOUS
  Filled 2020-02-17: qty 200

## 2020-02-17 MED ORDER — LACTATED RINGERS IV BOLUS
250.0000 mL | Freq: Once | INTRAVENOUS | Status: AC
  Administered 2020-02-17: 250 mL via INTRAVENOUS

## 2020-02-17 MED ORDER — ACETAMINOPHEN 325 MG PO TABS
650.0000 mg | ORAL_TABLET | Freq: Four times a day (QID) | ORAL | Status: DC | PRN
  Administered 2020-02-19 – 2020-02-21 (×5): 650 mg via ORAL
  Filled 2020-02-17 (×5): qty 2

## 2020-02-17 MED ORDER — ONDANSETRON HCL 4 MG/2ML IJ SOLN: 4.0000 mg | Freq: Four times a day (QID) | INTRAMUSCULAR | Status: DC | PRN

## 2020-02-17 MED ORDER — VANCOMYCIN HCL IN DEXTROSE 1-5 GM/200ML-% IV SOLN: 1000.0000 mg | INTRAVENOUS | Status: DC

## 2020-02-17 MED ORDER — AMLODIPINE BESYLATE 5 MG PO TABS
5.0000 mg | ORAL_TABLET | Freq: Every day | ORAL | Status: DC
  Administered 2020-02-17 – 2020-02-21 (×5): 5 mg via ORAL
  Filled 2020-02-17 (×5): qty 1

## 2020-02-17 MED ORDER — SODIUM CHLORIDE 0.9% FLUSH
3.0000 mL | Freq: Two times a day (BID) | INTRAVENOUS | Status: DC
  Administered 2020-02-17 – 2020-02-21 (×6): 3 mL via INTRAVENOUS

## 2020-02-17 MED ORDER — FLUTICASONE FUROATE-VILANTEROL 100-25 MCG/INH IN AEPB
1.0000 | INHALATION_SPRAY | Freq: Every day | RESPIRATORY_TRACT | Status: DC
  Administered 2020-02-19 – 2020-02-21 (×3): 1 via RESPIRATORY_TRACT
  Filled 2020-02-17 (×2): qty 28

## 2020-02-17 MED ORDER — ALBUTEROL SULFATE (2.5 MG/3ML) 0.083% IN NEBU: 2.5000 mg | INHALATION_SOLUTION | Freq: Four times a day (QID) | RESPIRATORY_TRACT | Status: DC | PRN

## 2020-02-17 MED ORDER — UMECLIDINIUM BROMIDE 62.5 MCG/INH IN AEPB
1.0000 | INHALATION_SPRAY | Freq: Every day | RESPIRATORY_TRACT | Status: DC
  Filled 2020-02-17: qty 7

## 2020-02-17 MED ORDER — VANCOMYCIN HCL 1500 MG/300ML IV SOLN
1500.0000 mg | Freq: Once | INTRAVENOUS | Status: DC
  Filled 2020-02-17: qty 300

## 2020-02-17 MED ORDER — METRONIDAZOLE IN NACL 5-0.79 MG/ML-% IV SOLN
500.0000 mg | Freq: Once | INTRAVENOUS | Status: DC
  Administered 2020-02-17: 500 mg via INTRAVENOUS
  Filled 2020-02-17: qty 100

## 2020-02-17 MED ORDER — LACTATED RINGERS IV SOLN: INTRAVENOUS | Status: AC

## 2020-02-17 MED ORDER — ONDANSETRON HCL 4 MG PO TABS: 4.0000 mg | ORAL_TABLET | Freq: Four times a day (QID) | ORAL | Status: DC | PRN

## 2020-02-17 MED ORDER — SODIUM CHLORIDE 0.9 % IV SOLN: 2.0000 g | Freq: Two times a day (BID) | INTRAVENOUS | Status: DC

## 2020-02-17 MED ORDER — LEVOTHYROXINE SODIUM 25 MCG PO TABS
25.0000 ug | ORAL_TABLET | Freq: Every day | ORAL | Status: DC
  Administered 2020-02-17 – 2020-02-18 (×2): 25 ug via ORAL
  Filled 2020-02-17 (×2): qty 1

## 2020-02-17 NOTE — ED Notes (Signed)
Called lab regarding urine sample sent from in and out, they report not enough for UA.

## 2020-02-17 NOTE — ED Notes (Signed)
Called lab, they will add lipase to current specimens in lab

## 2020-02-17 NOTE — ED Provider Notes (Signed)
[facility] Provider Note   CSN: [id number] Arrival date & time: 02/17/20  1442     History No chief complaint on file.   Joann Barnes is a 79 y.o. female with a past medical history of aortic stenosis, COPD, hypertension, hyperlipidemia, hypothyroid, who presents today for evaluation of confusion. History is obtained from patient's sister, whom she lives with, and chart review.    Level 5 caveat for confusion  Patient sister states that patient was normal yesterday however was confused today.  She fell in September and has prescriptions for hydrocodone and gabapentin for acute on chronic back pain from the fall.  Initially her sister states that she took the gabapentin for the first time today, however then states that the patient has had both the hydrocodone and gabapentin before she thinks and was not confused, this history is unclear.  Patient has not fallen since September, and did not strike her head or pass out when she did fall in September.  She is presumed Covid positive.  She is fully vaccinated however her sister developed Covid symptoms and tested positive.  Patient developed Covid symptoms on about 10/3.  She got monoclonal antibody infusion on 10/5.  She was seen in the emergency room on 10/11 for a COPD exacerbation in the setting of Covid.  At that point she was treated with albuterol and steroids and discharged.  Patient is unable to provide additional history.  HPI     Past Medical History:  Diagnosis Date  . Aortic stenosis    Mild AS 02/2016 echo  . Cancer ([place])    skin  . COPD (chronic obstructive pulmonary disease) ([place])   . Dyspnea   . GERD (gastroesophageal reflux disease)   . Hematuria   . Hernia, hiatal   . HTN (hypertension)   . Hyperlipidemia   . Hypothyroid   . Migraine   . OA (osteoarthritis)   . OP (osteoporosis)   . Other and unspecified angina pectoris    02/2016 - had non-ischemic stress test  .  Pneumonia   . Spondylolisthesis of lumbar region     Patient Active Problem List   Diagnosis Date Noted  . Encephalopathy acute 02/17/2020  . Healthcare maintenance 04/09/2019  . Abnormal findings on diagnostic imaging of lung 04/08/2019  . History of lumbar spinal fusion 03/14/2019  . Trochanteric bursitis, right hip 03/14/2019  . Spondylolisthesis of lumbar region 05/23/2017  . Acute kidney injury superimposed on chronic kidney disease ([place]) 02/04/2016  . Chest pain with moderate risk of acute coronary syndrome 02/04/2016  . Tobacco use 02/04/2016  . Hypokalemia 02/04/2016  . Murmur, cardiac-suspect this is AS 02/04/2016  . Hyperlipidemia   . GERD (gastroesophageal reflux disease)   . HTN (hypertension)   . Hypothyroid   . Chest pain   . COPD with +BD  11/16/2010  . Dyspnea 10/22/2010  . Smoking 10/22/2010    Past Surgical History:  Procedure Laterality Date  . APPENDECTOMY    . CATARACT EXTRACTION, BILATERAL    . CERVICAL DISC ARTHROPLASTY     x 2  . COLONOSCOPY    . ESOPHAGOGASTRODUODENOSCOPY  07/19/00  . HAND SURGERY Right 2014  . LAMINECTOMY  05/23/2017   L4-5, L5-S1     OB History   No obstetric history on file.     Family History  Problem Relation Age of Onset  . Heart disease Mother   . Heart disease Father   . Heart disease Brother   .  Cancer Sister        breast  . Cancer Sister        lung    Social History   Tobacco Use  . Smoking status: Current Every Day Smoker    Packs/day: 1.00    Years: 45.00    Pack years: 45.00    Types: Cigarettes  . Smokeless tobacco: Never Used  . Tobacco comment: currently smoking 0.5ppd as of 12/05/2018  Vaping Use  . Vaping Use: Never used  Substance Use Topics  . Alcohol use: No  . Drug use: No    Home Medications Prior to Admission medications   Medication Sig Start Date End Date Taking? Authorizing Provider  albuterol (ACCUNEB) 1.25 MG/3ML nebulizer solution Take 1 ampule by nebulization daily as  needed for wheezing or shortness of breath.     [provider]  amitriptyline (ELAVIL) 10 MG tablet Take 10 mg by mouth at bedtime as needed for sleep.  12/08/10   [provider]  amLODipine (NORVASC) 5 MG tablet Take 5 mg by mouth daily.      [provider]  amoxicillin (AMOXIL) 500 MG tablet Take 500 mg by mouth 2 (two) times daily.    [provider]  aspirin EC 81 MG EC tablet Take 1 tablet (81 mg total) by mouth daily. 02/07/16   Geradine Girt, DO  calcium carbonate (TUMS - DOSED IN MG ELEMENTAL CALCIUM) 500 MG chewable tablet Chew 2 tablets by mouth daily.    [provider]  Cholecalciferol (VITAMIN D3) 2000 units TABS Take 2,000 Units by mouth daily.    [provider]  diazepam (VALIUM) 5 MG tablet Take 5 mg by mouth 2 (two) times daily as needed (for back pain or anxiety).     [provider]  escitalopram (LEXAPRO) 20 MG tablet Take 20 mg by mouth daily.  08/15/17   [provider]  Fluticasone-Umeclidin-Vilant (TRELEGY ELLIPTA) 100-62.5-25 MCG/INH AEPB Inhale 1 puff into the lungs daily.    [provider]  gabapentin (NEURONTIN) 300 MG capsule Take 300 mg by mouth daily.    [provider]  HYDROcodone-acetaminophen (NORCO/VICODIN) 5-325 MG tablet Take by mouth. 02/15/20   [provider]  ibandronate (BONIVA) 3 MG/3ML SOLN injection Inject 3 mLs (3 mg total) into the vein every 3 (three) months. 11/16/10   Parrett, Fonnie Mu, NP  levothyroxine (LEVOXYL) 25 MCG tablet Take 25 mcg by mouth daily.      [provider]  olmesartan (BENICAR) 5 MG tablet Take 5 mg by mouth daily. 08/07/18   [provider]  omeprazole (PRILOSEC) 40 MG capsule Take 40 mg by mouth daily before breakfast.  09/20/17   [provider]  pravastatin (PRAVACHOL) 80 MG tablet Take 80 mg by mouth at bedtime.     [provider]  predniSONE (DELTASONE) 10 MG tablet Take by mouth. 01/29/20    [provider]  predniSONE (DELTASONE) 50 MG tablet One tablet a day 02/11/20   Sidney Ace  [facility] HFA 108 [phone] Base) MCG/ACT inhaler Inhale 2 puffs into the lungs 2 (two) times daily.  08/25/17   [provider]  traMADol (ULTRAM) 50 MG tablet Take 50 mg by mouth 3 (three) times daily as needed. 01/29/20   [provider]  vitamin C (ASCORBIC ACID) 250 MG tablet Take 250 mg by mouth daily.    [provider]    Allergies    Bactrim [sulfamethoxazole-trimethoprim], Nicotine, Codeine,  Lipitor [atorvastatin], Benadryl [diphenhydramine hcl], Chantix [varenicline tartrate], Chantix [varenicline], Wellbutrin [bupropion], and Zolpidem tartrate  Review of Systems   Review of Systems  Unable to perform ROS: Mental status change    Physical Exam Updated Vital Signs BP (!) 170/86   Pulse 80   Temp 98.6 F (37 C) (Oral)   Resp 17   SpO2 98%   Physical Exam Vitals and nursing note reviewed.  Constitutional:      General: She is not in acute distress.    Appearance: She is well-developed.     Comments: Fidgety, constantly talking  HENT:     Head: Normocephalic and atraumatic.  Eyes:     General: No scleral icterus.       Right eye: No discharge.        Left eye: No discharge.     Conjunctiva/sclera: Conjunctivae normal.  Cardiovascular:     Rate and Rhythm: Normal rate and regular rhythm.     Pulses: Normal pulses.     Heart sounds: Normal heart sounds.  Pulmonary:     Effort: Pulmonary effort is normal. No respiratory distress.     Breath sounds: No stridor. Wheezing (Minimal, bilateral) present.  Abdominal:     General: There is no distension.  Musculoskeletal:        General: No deformity.     Cervical back: Normal range of motion and neck supple.     Right lower leg: No edema.     Left lower leg: No edema.  Skin:    General: Skin is warm and dry.  Neurological:     Mental Status: She is alert.     Motor: No abnormal muscle  tone.     Comments: Patient is oriented to person, not to place or time.  She is not slurring her words.  She is able to answer yes/no type questions however unclear if these are reliable answers.  Spontaneous movement of bilateral arms and legs.  No facial droop.  Strength testing is limited secondary to patient confusion.  Psychiatric:        Mood and Affect: Mood is anxious.        Speech: Speech is tangential.        Cognition and Memory: She exhibits impaired recent memory and impaired remote memory.     ED Results / Procedures / Treatments   Labs (all labs ordered are listed, but only abnormal results are displayed) Labs Reviewed  CBC WITH DIFFERENTIAL/PLATELET - Abnormal; Notable for the following components:      Result Value   WBC 17.1 (*)    Hemoglobin 10.9 (*)    HCT 34.1 (*)    RDW 16.6 (*)    Platelets 510 (*)    Neutro Abs 13.8 (*)    Monocytes Absolute 1.3 (*)    Abs Immature Granulocytes 0.27 (*)    All other components within normal limits  COMPREHENSIVE METABOLIC PANEL - Abnormal; Notable for the following components:   Glucose, Bld 109 (*)    Creatinine, Ser 1.18 (*)    Calcium 8.6 (*)    Total Protein 6.2 (*)    Albumin 3.0 (*)    GFR, Estimated 44 (*)    All other components within normal limits  D-DIMER, QUANTITATIVE (NOT AT [facility]) - Abnormal; Notable for the following components:   D-Dimer, Quant 7.66 (*)    All other components within normal limits  LACTATE DEHYDROGENASE - Abnormal; Notable for the following components:   LDH 263 (*)  All other components within normal limits  TRIGLYCERIDES - Abnormal; Notable for the following components:   Triglycerides 166 (*)    All other components within normal limits  FIBRINOGEN - Abnormal; Notable for the following components:   Fibrinogen 577 (*)    All other components within normal limits  C-REACTIVE PROTEIN - Abnormal; Notable for the following components:   CRP 9.9 (*)    All other components within  normal limits  RAPID URINE DRUG SCREEN, HOSP PERFORMED - Abnormal; Notable for the following components:   Opiates POSITIVE (*)    Benzodiazepines POSITIVE (*)    All other components within normal limits  URINALYSIS, COMPLETE (UACMP) WITH MICROSCOPIC - Abnormal; Notable for the following components:   Ketones, ur 5 (*)    All other components within normal limits  CBC - Abnormal; Notable for the following components:   WBC 16.4 (*)    Hemoglobin 10.4 (*)    HCT 32.6 (*)    RDW 16.3 (*)    Platelets 476 (*)    All other components within normal limits  CREATININE, SERUM - Abnormal; Notable for the following components:   Creatinine, Ser 1.05 (*)    GFR, Estimated 50 (*)    All other components within normal limits  I-STAT VENOUS BLOOD GAS, ED - Abnormal; Notable for the following components:   pH, Ven 7.450 (*)    pCO2, Ven 36.8 (*)    pO2, Ven 62.0 (*)    Calcium, Ion 1.10 (*)    HCT 32.0 (*)    Hemoglobin 10.9 (*)    All other components within normal limits  RESPIRATORY PANEL BY RT PCR (FLU A&B, COVID)  CULTURE, BLOOD (ROUTINE X 2)  CULTURE, BLOOD (ROUTINE X 2)  URINE CULTURE  MRSA PCR SCREENING  LACTIC ACID, PLASMA  PROCALCITONIN  FERRITIN  LIPASE, BLOOD  TSH  COMPREHENSIVE METABOLIC PANEL  CBC    EKG EKG Interpretation  Date/Time:  Sunday February 17 2020 15:59:59 EDT Ventricular Rate:  89 PR Interval:    QRS Duration: 83 QT Interval:  366 QTC Calculation: 446 R Axis:   -26 Text Interpretation: Sinus rhythm Atrial premature complex Borderline left axis deviation Confirmed by Pattricia Boss [phone]) on 02/17/2020 5:43:56 PM   Radiology CT Head Wo Contrast  Result Date: 02/17/2020 CLINICAL DATA:  Mental status changes, COVID-19 positivity EXAM: CT HEAD WITHOUT CONTRAST TECHNIQUE: Contiguous axial images were obtained from the base of the skull through the vertex without intravenous contrast. COMPARISON:  None. FINDINGS: Brain: No evidence of acute infarction,  hemorrhage, hydrocephalus, extra-axial collection or mass lesion/mass effect. Vascular: No hyperdense vessel or unexpected calcification. Skull: Normal. Negative for fracture or focal lesion. Sinuses/Orbits: No acute finding. Other: None. IMPRESSION: No acute intracranial abnormality noted. Electronically Signed   By: Inez Catalina M.D.   On: 02/17/2020 16:52   DG Chest Port 1 View  Result Date: 02/17/2020 CLINICAL DATA:  COVID-19 positivity with confusion, initial encounter EXAM: PORTABLE CHEST 1 VIEW COMPARISON:  02/11/2020 FINDINGS: Cardiac shadow is stable. Aortic calcifications are again seen. No acute infiltrate or sizable effusion is seen. Postsurgical changes are noted. No bony abnormality is seen. IMPRESSION: No active disease. Electronically Signed   By: Inez Catalina M.D.   On: 02/17/2020 16:04    Procedures .Critical Care Performed by: Lorin Glass, PA-C Authorized by: Lorin Glass, PA-C   Critical care provider statement:    Critical care time (minutes):  45   Critical care time was exclusive  of:  Separately billable procedures and treating other patients   Critical care was time spent personally by me on the following activities:  Discussions with consultants, evaluation of patient's response to treatment, examination of patient, ordering and performing treatments and interventions, ordering and review of laboratory studies, ordering and review of radiographic studies, pulse oximetry, re-evaluation of patient's condition, obtaining history from patient or surrogate and review of old charts   (including critical care time)  Medications Ordered in ED Medications  lactated ringers infusion ( Intravenous New Bag/Given 02/17/20 1854)  ceFEPIme (MAXIPIME) 2 g in sodium chloride 0.9 % 100 mL IVPB (has no administration in time range)  vancomycin (VANCOCIN) IVPB 1000 mg/200 mL premix (has no administration in time range)  lactated ringers bolus 250 mL (0 mLs Intravenous  Stopped 02/17/20 1854)  ceFEPIme (MAXIPIME) 2 g in sodium chloride 0.9 % 100 mL IVPB (0 g Intravenous Stopped 02/17/20 2010)    ED Course  I have reviewed the triage vital signs and the nursing notes.  Pertinent labs & imaging results that were available during my care of the patient were reviewed by me and considered in my medical decision making (see chart for details).  Clinical Course as of Feb 16 2310  Sun Feb 17, 2020  1526 Attempted to contact contacts in chart.  No answer.     [EH]  1534 I spoke with patient's sister [name] she was confused today, was normal yesterday.  No falls since September, had gabapentin for the first time today.     [EH]    Clinical Course User Index [EH] Ollen Gross   MDM Rules/Calculators/A&P                          Patient is a 79 year old woman who presents today for evaluation of confusion.  History is limited due to patient's confusion, along with her sister providing limited history.  Patient is reportedly confused only today.  She had Covid presumed earlier this month as her family member tested positive, and she developed symptoms.  She did have antibody infusion however no actual tests were obtained.  Here her Covid test is negative.  Patient's white count is significantly elevated at 17.1.  No clear infectious cause, however given that patient has leukocytosis and encephalopathy she is started on broad-spectrum antibiotics.  Her lactic acid is not elevated over 4 and she is not hypotensive therefore she is not given 30/kg fluid bolus.  Blood and urine cultures are ordered.  UDS is positive for opioids and benzodiazepines.  Also according to sister patient takes gabapentin, uncertain if these are new medications or not.  Covid admission labs are ordered.  CT head without acute abnormality.      CMP shows slightly elevated Cr however no acute electrolyte abnormalities.  D-dimer is elevated, suspect recent covid, will defer to  inpatient team for further evaluation.    Hospitalist is consulted who will see patient for admission for acute encephalopathy.  Note: Portions of this report may have been transcribed using voice recognition software. Every effort was made to ensure accuracy; however, inadvertent computerized transcription errors may be present  Final Clinical Impression(s) / ED Diagnoses Final diagnoses:  Suspected COVID-19 virus infection  Confusion    Rx / DC Orders ED Discharge Orders    None       Lorin Glass, PA-C 02/17/20 2333    Pattricia Boss, MD 02/19/20 1124

## 2020-02-17 NOTE — H&P (Signed)
History and Physical    Joann Barnes TKZ:[id number] DOB: [birth year] DOA: 02/17/2020  PCP: Merrilee Seashore, MD  Patient coming from: Home  Chief Complaint: talking out of her head  HPI: Joann Barnes is a 79 y.o. female with medical history significant of chronic back pain s/p surgeries, osteoporosis, OA, hypothyroidism, HLD, HTN, GERD, COPD on trelegy, CKD who presents for confusion.  The patient was unable to answer many questions. She is alert to only her name.  She reported peeing more, but that was all  Spoke with Sister Earl Gala.  Noted that for the last day she had been more confused and "Talking out of her head" based on her granddaughters report.  Joann Barnes had had a fall in September and due to her previous back surgeries, had significant increase in pain.  She had started some new medications including hydrocodone and gabapentin.  Her sister was concerned that during the last few days, and due to the pain, that she had been drinking less fluids.  She was having trouble urinating, but no pain.  She did take 2 cranberry tablets to help her go.  No abdominal pain, no fever, chills, no chest pain.    Of note, she was seen in the ED on 10/11 and was prescribed prednisone for a presumed COPD exacerbation.  She had 5 tablets of this medication.  She is current tobacco smoker.    She has recently been treated for COVID, but has never had a positive test per discussion with EDP and sister.  Her sister was diagnosed with COVID and the patient developed symptoms around the same time (10/1).  She did have a monoclonal antibody infusion on 10/5 for this presumed diagnosis.  Testing did come back negative.  She had symptoms of a COPD exacerbation on 10/11.  Overall, it does not seem that she actually had COVID 19 and certainly did not have pneumonia.  She does not have cough or fever at this time.    Further medications include valium, lexapro. She is hypothyroid and is on low dose  levothyroxine replacement.    ED Course: In the ED, it was initially unclear if she had an infection.  WBC was 17.  UA is pending at this time.  CXR is without active disease.  She had an ABG which showed mildly low CO2, but normal pH.   Cr was 1.18 which appears to be at baseline.  She initially got COVID type inflammatory labs, but given she has never tested positive, this is a less likely diagnosis.  Her D Dimer, however, is 7.66.  She is not hypoxic.  She does not have a cough.  COVID and influenza are elevated.  She does not have an elevated HR.    Review of Systems: As per HPI otherwise all other systems reviewed and are presumed negative, family could not give much more history.  Past Medical History:  Diagnosis Date  . Aortic stenosis    Mild AS 02/2016 echo  . Cancer ([place])    skin  . COPD (chronic obstructive pulmonary disease) ([place])   . Dyspnea   . GERD (gastroesophageal reflux disease)   . Hematuria   . Hernia, hiatal   . HTN (hypertension)   . Hyperlipidemia   . Hypothyroid   . Migraine   . OA (osteoarthritis)   . OP (osteoporosis)   . Other and unspecified angina pectoris    02/2016 - had non-ischemic stress test  . Pneumonia   .  Spondylolisthesis of lumbar region     Past Surgical History:  Procedure Laterality Date  . APPENDECTOMY    . CATARACT EXTRACTION, BILATERAL    . CERVICAL DISC ARTHROPLASTY     x 2  . COLONOSCOPY    . ESOPHAGOGASTRODUODENOSCOPY  07/19/00  . HAND SURGERY Right 2014  . LAMINECTOMY  05/23/2017   L4-5, L5-S1    Social History  reports that she has been smoking cigarettes. She has a 45.00 pack-year smoking history. She has never used smokeless tobacco. She reports that she does not drink alcohol and does not use drugs.  Allergies  Allergen Reactions  . Bactrim [Sulfamethoxazole-Trimethoprim] Shortness Of Breath  . Nicotine Anaphylaxis and Other (See Comments)    Rash and itch with patch locally  Chewing the gum closed her throat  .  Codeine Itching and Nausea And Vomiting  . Lipitor [Atorvastatin] Other (See Comments)    Muscle aches  . Benadryl [Diphenhydramine Hcl] Other (See Comments)    Drowsiness   . Chantix [Varenicline Tartrate] Hives, Itching, Nausea And Vomiting and Rash  . Chantix [Varenicline] Hives, Itching, Nausea And Vomiting and Rash  . Wellbutrin [Bupropion] Rash    Slight rash around ankles  . Zolpidem Tartrate Other (See Comments)    Bad dreams    Family History  Problem Relation Age of Onset  . Heart disease Mother   . Heart disease Father   . Heart disease Brother   . Cancer Sister        breast  . Cancer Sister        lung    Prior to Admission medications   Medication Sig Start Date End Date Taking? Authorizing Provider  albuterol (ACCUNEB) 1.25 MG/3ML nebulizer solution Take 1 ampule by nebulization daily as needed for wheezing or shortness of breath.     [provider]  amitriptyline (ELAVIL) 10 MG tablet Take 10 mg by mouth at bedtime as needed for sleep.  12/08/10   [provider]  amLODipine (NORVASC) 5 MG tablet Take 5 mg by mouth daily.      [provider]  amoxicillin (AMOXIL) 500 MG tablet Take 500 mg by mouth 2 (two) times daily.    [provider]  aspirin EC 81 MG EC tablet Take 1 tablet (81 mg total) by mouth daily. 02/07/16   Geradine Girt, DO  calcium carbonate (TUMS - DOSED IN MG ELEMENTAL CALCIUM) 500 MG chewable tablet Chew 2 tablets by mouth daily.    [provider]  Cholecalciferol (VITAMIN D3) 2000 units TABS Take 2,000 Units by mouth daily.    [provider]  diazepam (VALIUM) 5 MG tablet Take 5 mg by mouth 2 (two) times daily as needed (for back pain or anxiety).     [provider]  escitalopram (LEXAPRO) 20 MG tablet Take 20 mg by mouth daily.  08/15/17   [provider]  Fluticasone-Umeclidin-Vilant (TRELEGY ELLIPTA) 100-62.5-25 MCG/INH AEPB Inhale 1 puff into the lungs daily.     [provider]  gabapentin (NEURONTIN) 300 MG capsule Take 300 mg by mouth daily.    [provider]  ibandronate (BONIVA) 3 MG/3ML SOLN injection Inject 3 mLs (3 mg total) into the vein every 3 (three) months. 11/16/10   Parrett, Fonnie Mu, NP  levothyroxine (LEVOXYL) 25 MCG tablet Take 25 mcg by mouth daily.      [provider]  olmesartan (BENICAR) 5 MG tablet Take 5 mg by mouth daily. 08/07/18  [provider]  omeprazole (PRILOSEC) 40 MG capsule Take 40 mg by mouth daily before breakfast.  09/20/17   [provider]  pravastatin (PRAVACHOL) 80 MG tablet Take 80 mg by mouth at bedtime.     [provider]  predniSONE (DELTASONE) 50 MG tablet One tablet a day 02/11/20   Sidney Ace  [facility] HFA 108 [phone] Base) MCG/ACT inhaler Inhale 2 puffs into the lungs 2 (two) times daily.  08/25/17   [provider]  vitamin C (ASCORBIC ACID) 250 MG tablet Take 250 mg by mouth daily.    [provider]    Physical Exam: Constitutional: Lying in bed, mild agitation, distracted Vitals:   02/17/20 1730 02/17/20 1800 02/17/20 1801 02/17/20 1900  BP: (!) 167/83 140/85  (!) 174/83  Pulse: (!) 102 (!) 101  81  Resp: 15 (!) 22  16  Temp:   99.6 F (37.6 C)   TempSrc:   Rectal   SpO2: 94% 97%  93%   Eyes:  lids and conjunctivae normal ENMT: Mucous membranes are moist. .Normal dentition.  Neck: normal, supple Respiratory: CTAB, no wheezing or crackles.  Cardiovascular: RR, NR, no murmur noted, no pedal edema, good pulses at radial and DP arteries bilaterally.  No pedal edema Abdomen: NT, ND, +BS GU: Pure wick in place, urine appears clear and yellow in color Musculoskeletal: no clubbing / cyanosis. Normal muscle tone.  Skin: no rashes, lesions, ulcers on back, chest or genital area.  No wounds on feet.  No diaphoresis Neurologic: She is able to move independently in bed, grossly intact strength Psychiatric: Very confused and  distracted, waxing and waning, alert.  Oriented to self only.    Labs on Admission: I have personally reviewed following labs and imaging studies  CBC: Recent Labs  Lab 02/17/20 1611 02/17/20 1900  WBC 17.1*  --   NEUTROABS 13.8*  --   HGB 10.9* 10.9*  HCT 34.1* 32.0*  MCV 82.8  --   PLT 510*  --     Basic Metabolic Panel: Recent Labs  Lab 02/17/20 1611 02/17/20 1900  NA 136 135  K 4.0 4.1  CL 100  --   CO2 25  --   GLUCOSE 109*  --   BUN 16  --   CREATININE 1.18*  --   CALCIUM 8.6*  --     GFR: CrCl cannot be calculated (Unknown ideal weight.).  Liver Function Tests: Recent Labs  Lab 02/17/20 1611  AST 16  ALT 23  ALKPHOS 108  BILITOT 0.8  PROT 6.2*  ALBUMIN 3.0*    Urine analysis: Pending  Radiological Exams on Admission: CT Head Wo Contrast  Result Date: 02/17/2020 CLINICAL DATA:  Mental status changes, COVID-19 positivity EXAM: CT HEAD WITHOUT CONTRAST TECHNIQUE: Contiguous axial images were obtained from the base of the skull through the vertex without intravenous contrast. COMPARISON:  None. FINDINGS: Brain: No evidence of acute infarction, hemorrhage, hydrocephalus, extra-axial collection or mass lesion/mass effect. Vascular: No hyperdense vessel or unexpected calcification. Skull: Normal. Negative for fracture or focal lesion. Sinuses/Orbits: No acute finding. Other: None. IMPRESSION: No acute intracranial abnormality noted. Electronically Signed   By: Inez Catalina M.D.   On: 02/17/2020 16:52   DG Chest Port 1 View  Result Date: 02/17/2020 CLINICAL DATA:  COVID-19 positivity with confusion, initial encounter EXAM: PORTABLE CHEST 1 VIEW COMPARISON:  02/11/2020 FINDINGS: Cardiac shadow is stable. Aortic calcifications are again seen. No acute infiltrate or sizable effusion is seen.  Postsurgical changes are noted. No bony abnormality is seen. IMPRESSION: No active disease. Electronically Signed   By: Inez Catalina M.D.   On: 02/17/2020 16:04    EKG:  Independently reviewed. NSR with PACs.   Assessment/Plan Encephalopathy acute - DDx includes toxic related to an acute infection vs. Metabolic related to thyroid disease and/or polypharmacy - She has recently started steroids, hydrocodone and gabapentin - Hold centrally acting medications at this time including valium, hydrocodone, gabapentin, lexapro, amitriptyline.  Will also need to monitor closely for withdrawal - Patient received Vanc/Cefepime in the ED which I continued for now - BC X 2 and UC pending - UA pending, urine appeared clear, though she may have been having some urinary symptoms at home - Check a TSH - Monitor on telemetry - Will work up any other symptoms if/when they develop - IVF with LR at 150 cc/hr overnight, trend renal function  Recent URI, Neg COVID test, elevated inflammatory markers - apparently without a positive test - First symptoms 10/1 and now asymptomatic.  She has is now 16 days since start of symptoms and received MAB treatment - COVID and flu negative - Steroids should have been completed - Unclear course of D dimer, could be elevated due to a true COVID infection, which she is now recovered from - Well's score 0; CXR stable, no elevated HR (not on BB), no LE swelling or erythema - Trend D dimer, if trending higher, consider CT of the chest or VQ scan.   COPD Tobacco use - She has just completed a course of steroids for an exacerbation and continues to smoke - This could explain her elevated WBC, will trend and work up for infection as noted above - She has anaphylaxis to Nicotine patch, so will not start  Hyperlipidemia - Hold statin given acute confusion    GERD (gastroesophageal reflux disease) - Hold omeprazole given acute state    HTN (hypertension) - Continue amlodipine, hold olmesartan given concern for kidney issues    Hypothyroid - Check TSH - Continue levothyroxine until test returns.     Spondylolisthesis of lumbar  region History of lumbar spinal fusion Acute on chronic back pain - Polypharmacy could be a cause of her above issues, will hold psychoactive medications overnight - Delirium precautions - Tylenol for pain  CKD - Renal function has been around 1.2 - 1.5 since 2015 - IVF as noted above - Trend renal function - Hold olmesartan overnight     DVT prophylaxis: Lovenox (PPx dose)  Code Status:   Full Family Communication:  Sister [name] on the phone Disposition Plan:   Patient is from:  Home  Anticipated DC to:  Home  Anticipated DC date:  02/20/20  Anticipated DC barriers: IMprovement in symptoms  Consults called:  None Admission status:  INP, Telemetry   Severity of Illness: The appropriate patient status for this patient is INPATIENT. Inpatient status is judged to be reasonable and necessary in order to provide the required intensity of service to ensure the patient's safety. The patient's presenting symptoms, physical exam findings, and initial radiographic and laboratory data in the context of their chronic comorbidities is felt to place them at high risk for further clinical deterioration. Furthermore, it is not anticipated that the patient will be medically stable for discharge from the hospital within 2 midnights of admission. The following factors support the patient status of inpatient.   " The patient's presenting symptoms include confusion. " The worrisome physical exam findings include confusion. "  The initial radiographic and laboratory data are worrisome because of elevated WBC, elevated Cr. " The chronic co-morbidities include hypothyroidism, COPD, HTN.   * I certify that at the point of admission it is my clinical judgment that the patient will require inpatient hospital care spanning beyond 2 midnights from the point of admission due to high intensity of service, high risk for further deterioration and high frequency of surveillance required.Gilles Chiquito  MD [facility]  How to contact the [facility] Attending or Consulting provider [place] or covering provider during after hours [place], for this patient?   1. Check the care team in [facility] and look for a) attending/consulting [facility] provider listed and b) the [facility] team listed 2. Log into [email or web address] and use [place]'s universal password to access. If you do not have the password, please contact the hospital operator. 3. Locate the [facility] provider you are looking for under [facility] and page to a number that you can be directly reached. 4. If you still have difficulty reaching the provider, please page the [facility] (Director on Call) for the Hospitalists listed on amion for assistance.  02/17/2020, 8:17 PM

## 2020-02-17 NOTE — ED Triage Notes (Signed)
Pt BIB EMS for increased confusion. Caretaker (grandmother) does not know the last time pt was normal. Pt has had falls recently, which causes a lot of pain. Diagnosed with COVID 10/2. Pt A&O to self, not oriented to place, month or year  Vitals: BP: 200/90 HR: 84 RR: 20 O2: 96% RA

## 2020-02-17 NOTE — ED Notes (Signed)
Patient will not keep arms straight despite reinforcement. Patient is pleasantly confused. Arm board applied to [facility] and secured with [place].

## 2020-02-17 NOTE — ED Notes (Signed)
Talked on the phone with pts niece Denny Peon. She was given an update on pts status. Would like to get another update tomorrow.  Phone: [phone]

## 2020-02-17 NOTE — Progress Notes (Signed)
Pharmacy Antibiotic Note  Joann Barnes is a 79 y.o. female admitted on 02/17/2020 with AMS and concern for infectious source.  Pharmacy has been consulted for vancomycin and cefepime dosing.  Plan: Vancomycin 1500 mg, then 1000 mg IV every 24 hours (target vancomycin trough 15-20) Cefepime 2g IV every 12 hours Monitor renal function, Cx and clinical progression to narrow Vancomycin trough at steady state      Temp (24hrs), Avg:98.2 F (36.8 C), Min:98.2 F (36.8 C), Max:98.2 F (36.8 C)  Recent Labs  Lab 02/17/20 1611  WBC 17.1*  CREATININE 1.18*  LATICACIDVEN 1.1    CrCl cannot be calculated (Unknown ideal weight.).    Allergies  Allergen Reactions  . Bactrim [Sulfamethoxazole-Trimethoprim] Shortness Of Breath  . Nicotine Anaphylaxis and Other (See Comments)    Rash and itch with patch locally  Chewing the gum closed her throat  . Codeine Itching and Nausea And Vomiting  . Lipitor [Atorvastatin] Other (See Comments)    Muscle aches  . Benadryl [Diphenhydramine Hcl] Other (See Comments)    Drowsiness   . Chantix [Varenicline Tartrate] Hives, Itching, Nausea And Vomiting and Rash  . Chantix [Varenicline] Hives, Itching, Nausea And Vomiting and Rash  . Wellbutrin [Bupropion] Rash    Slight rash around ankles  . Zolpidem Tartrate Other (See Comments)    Bad dreams    Bertis Ruddy, PharmD Clinical Pharmacist ED Pharmacist Phone # [phone] 02/17/2020 5:45 PM

## 2020-02-17 NOTE — ED Notes (Signed)
Admitting MD at bedside. VO given to cancel 1500mg  Vanc and precations.

## 2020-02-18 ENCOUNTER — Inpatient Hospital Stay (HOSPITAL_COMMUNITY): Payer: PPO

## 2020-02-18 DIAGNOSIS — R410 Disorientation, unspecified: Secondary | ICD-10-CM

## 2020-02-18 DIAGNOSIS — G9340 Encephalopathy, unspecified: Secondary | ICD-10-CM

## 2020-02-18 LAB — CBC
HCT: 32 % — ABNORMAL LOW (ref 36.0–46.0)
Hemoglobin: 10.2 g/dL — ABNORMAL LOW (ref 12.0–15.0)
MCH: 25.8 pg — ABNORMAL LOW (ref 26.0–34.0)
MCHC: 31.9 g/dL (ref 30.0–36.0)
MCV: 81 fL (ref 80.0–100.0)
Platelets: 443 10*3/uL — ABNORMAL HIGH (ref 150–400)
RBC: 3.95 MIL/uL (ref 3.87–5.11)
RDW: 16.4 % — ABNORMAL HIGH (ref 11.5–15.5)
WBC: 14.3 10*3/uL — ABNORMAL HIGH (ref 4.0–10.5)
nRBC: 0 % (ref 0.0–0.2)

## 2020-02-18 LAB — COMPREHENSIVE METABOLIC PANEL
ALT: 19 U/L (ref 0–44)
AST: 14 U/L — ABNORMAL LOW (ref 15–41)
Albumin: 2.5 g/dL — ABNORMAL LOW (ref 3.5–5.0)
Alkaline Phosphatase: 95 U/L (ref 38–126)
Anion gap: 10 (ref 5–15)
BUN: 12 mg/dL (ref 8–23)
CO2: 23 mmol/L (ref 22–32)
Calcium: 8.4 mg/dL — ABNORMAL LOW (ref 8.9–10.3)
Chloride: 101 mmol/L (ref 98–111)
Creatinine, Ser: 0.97 mg/dL (ref 0.44–1.00)
GFR, Estimated: 55 mL/min — ABNORMAL LOW (ref >60)
Glucose, Bld: 93 mg/dL (ref 70–99)
Potassium: 3.8 mmol/L (ref 3.5–5.1)
Sodium: 134 mmol/L — ABNORMAL LOW (ref 135–145)
Total Bilirubin: 0.9 mg/dL (ref 0.3–1.2)
Total Protein: 5.4 g/dL — ABNORMAL LOW (ref 6.5–8.1)

## 2020-02-18 LAB — CBG MONITORING, ED: Glucose-Capillary: 108 mg/dL — ABNORMAL HIGH (ref 70–99)

## 2020-02-18 MED ORDER — LEVOTHYROXINE SODIUM 25 MCG PO TABS
25.0000 ug | ORAL_TABLET | Freq: Every day | ORAL | Status: DC
  Administered 2020-02-19 – 2020-02-21 (×3): 25 ug via ORAL
  Filled 2020-02-18 (×3): qty 1

## 2020-02-18 NOTE — ED Notes (Signed)
EEG in room 

## 2020-02-18 NOTE — Progress Notes (Signed)
Pt has elevated diastolic BP with no indication of CVA yet.  Will retry evaluation for PT when pt can allow.   02/18/20 1000  PT Visit Information  Reason Eval/Treat Not Completed Medical issues which prohibited therapy    Mee Hives, PT MS Acute Rehab Dept. Number: [place] [facility] [place]

## 2020-02-18 NOTE — ED Notes (Signed)
Back from MRI.

## 2020-02-18 NOTE — ED Notes (Addendum)
Pt's CBG result was 108. Informed Joe - RN.

## 2020-02-18 NOTE — ED Notes (Signed)
Checked on Pt and told her that I had spoke to her sister. Pt told me to tell the sister to hurry up, she then picked up the phone and without dialing started shouting for her sister to come see her.

## 2020-02-18 NOTE — Procedures (Signed)
Patient Name: Joann Barnes  MRN: [id number]  Epilepsy Attending: Lora Havens  Referring Physician/Provider: Dr Myrene Buddy Date: 02/17/2020 Duration: 23.48 mins  Patient history: 79yo F with ams. EEG to evaluate for seizure.   Level of alertness: Awake  AEDs during EEG study: None  Technical aspects: This EEG study was done with scalp electrodes positioned according to the 10-20 International system of electrode placement. Electrical activity was acquired at a sampling rate of 500Hz  and reviewed with a high frequency filter of 70Hz  and a low frequency filter of 1Hz . EEG data were recorded continuously and digitally stored.   Description: No posterior dominant rhythm was seen.  EEG showed continuous generalized 3 to 6 Hz theta-delta slowing.  Hyperventilation and photic stimulation were not performed.     EEG was technically difficult due to significant eye flutter artifact was noted.   ABNORMALITY -Continuous slow, generalized   IMPRESSION: This study is suggestive of mild to moderate diffuse encephalopathy, nonspecific etiology. No seizures or epileptiform discharges were seen throughout the recording.   [name] Barbra Sarks

## 2020-02-18 NOTE — ED Notes (Signed)
Sister of Pt called back and wished to be updated on changes. Her name is Mrs. McCoy at [phone]

## 2020-02-18 NOTE — ED Notes (Signed)
Patient transported to MRI 

## 2020-02-18 NOTE — Progress Notes (Signed)
EEG complete - results pending 

## 2020-02-18 NOTE — ED Notes (Signed)
Sat Pt up and repositioned for better oxygenation

## 2020-02-18 NOTE — Progress Notes (Addendum)
[facility] PROGRESS NOTE    SONAM WANDEL  MHD:[id number] DOB: 1940/11/07 DOA: 02/17/2020 PCP: Merrilee Seashore, MD      Brief Narrative:  Joann Barnes is a 79 y.o. F with COPD not on O2, HTN, hypothyroidism, and smoking who presented with confusion.  Evidently, patient exposed to her sister 2 weeks ago who had COVID.  Developed URI symptoms, received monoclonal Abs/REGEN-COV despite no positive COVID test.  Continued to have dyspnea on exertion, seen in the ER 1 week prior to admission, prescribed prednisone burst which she completed.  Evidently, patient lives alone and was oriented the day prior to admission. The day of admission, patient was confused, family brought her to the ER.  In the ER, CT head normal.  CXR clear and UA without inflammation.  COVID negative, procal undetectable.  UDS positive for opiates and benzodiazepines.  WBC 17K, electrolytes normal, renal function at baseline.  She was started on broad spectrum antibiotics and the hospitalists were consulted for work up.       Assessment & Plan:  Acute metabolic encpehalopathy The cause of this is not clear.  Overall, has a tachycardia, confusion, xerostomia and mydriasis picture, consistent with anti-cholinergic/TCA toxidrome.  The differential should include toxic encephalopathy from medication excess.  In light of her prominent aphasia today, stroke should be included.  B12 deficiency and neurosyphilis are also reversible causes of encephalopathy.  Seizure should also be considered, although she appears to be mentating and is not currently in status epilepticus.   Less likely causes include CNS infection.  Etiologies that have been ruled out are thyroid disease (TSH normal) and infection (CXR clear, UA clear, no fever).  Her UDS had benzodiazepines and opiates detected (review of PDMP shows she had a recent new Norco rx, but also old Valium and Tramadol scripts).  Was also recently on  steroids 50 mg pred for 5 days.  Lastly, she has amitriptyline, tricyclic overdose could contribute to arrhythmia and delirium.  -Hold all CNS active meds  -Continue IV fluids  -Continue telemetry -Obtain MRI brain  -Obtain stat EEG  -Check ammonia, RPR, B12   Irregular heart rate  Appears to be in Afib on the monitor.  THis does not appear in her problem list or last Cardiology note, would be new.  Is not on a blood thinner.  CHA2DS2-Vasc 4.    Repeat ECG shows no Afib, poor quality basline however limits eval of Pwaves. -MOnitor on tele -Repeat ECG tomorrow when mentation better   COVID? Patient had a recent exposure.  She was given REGEN-COV, but actually never tested positive.  Here she tests negative, and so I believe this is not contributing.  Hypertension BP elevated due to agitation -Continue amlodipine  Hypothyroidism TSH normal -Continue levothyroxine   COPD without exacerbation -Continue ICS LABA -Hold LAMA  Leukocytosis Unclear cause, nonspecific.  Maybe from steroids?  No fever or focal signs of infection.  CKD IIIa Cr stable relative to baseline  Hyponatremia Na 134 today, mild, asymptomatic.  Anemia, unknown cause, microcytic Ferritin low normal -Check iron studies  Anisocoria From Chart review, this is old      Disposition: Status is: Inpatient  Remains inpatient appropriate because:Altered mental status   Dispo: The patient is from: Home              Anticipated d/c is to: TBD              Anticipated d/c date is: 3 days  Patient currently is not medically stable to d/c.     Patient admitted with altered mental status.  She remains altered.  The differential still broad, although I still favor that this is a toxidrome.           MDM: The below labs and imaging reports were reviewed and summarized above.  Medication management as above.  Acute neurological changes    DVT prophylaxis: enoxaparin  (LOVENOX) injection 40 mg Start: 02/17/20 2200  Code Status: FULL Family Communication: Sister by phone    Consultants:   none  Procedures:   CT head unremarkable  MRI brain pending  EEG pending  Antimicrobials:   Cefepime, Flagyl, vancomycin once10/17  Culture data:   10/17 blood culture x2-pending  10/17 urine culture-pending          Subjective: Discussed with nursing at the bedside who had the patient yesterday.  They report that the patient has had waxing and waning confusion, more agitated at times, more calm and able to speak in short sentences at other times.  No acute change this morning.  No fever.  No cough, respiratory symptoms.  Still very confused.  No abdominal symptoms.  No rashes.  Objective: Vitals:   02/18/20 0915 02/18/20 0945 02/18/20 1057 02/18/20 1100  BP: (!) 161/112 (!) 167/119 (!) 139/101 (!) 170/104  Pulse: (!) 104 88  (!) 52  Resp: 19 18  13   Temp:      TempSrc:      SpO2: 94% 96%  (!) 80%    Intake/Output Summary (Last 24 hours) at 02/18/2020 1325 Last data filed at 02/17/2020 2048 Gross per 24 hour  Intake 595 ml  Output --  Net 595 ml   There were no vitals filed for this visit.  Examination: General appearance: Elderly adult female, awake and makes eye contact but agitated and confused.   HEENT: Anicteric, conjunctiva pink, lids and lashes normal. No nasal deformity, discharge, epistaxis.  Lips dry, dentition in normal repair, oropharynx very dry, no oral lesions, hearing normal.   Skin: Warm and dry.  There is a scab on the right elbow, otherwise no ecchymoses, rashes on the face, neck, arms, chest, abdomen, or legs no jaundice.    Cardiac: Tachycardic, irregularly irregular, nl S1-S2, no murmurs appreciated.  Capillary refill is brisk.  JVP not visible.  No LE edema.  Radial pulses 2+ and symmetric. Respiratory: Normal respiratory rate and rhythm.  CTAB without rales or wheezes. Abdomen: Abdomen soft.  No TTP or  guarding. No ascites, distension, hepatosplenomegaly.   MSK: No deformities or effusions. Neuro: Awake and interactive, but confused and agitated.  EOMI, left pupil 5 mm, right pupil 4 mm, both reactive.  Moves all extremities with 5/5 strength.  No sensory deficit to light touch.  Patient's speech is incoherent, and she seems to be perseverative, but cannot articulate anything.  Her receptive language seems intact.    Psych: Agitated, affect anxious.    Data Reviewed: I have personally reviewed following labs and imaging studies:  CBC: Recent Labs  Lab 02/17/20 1611 02/17/20 1900 02/17/20 2048 02/18/20 0516  WBC 17.1*  --  16.4* 14.3*  NEUTROABS 13.8*  --   --   --   HGB 10.9* 10.9* 10.4* 10.2*  HCT 34.1* 32.0* 32.6* 32.0*  MCV 82.8  --  82.3 81.0  PLT 510*  --  476* 892*   Basic Metabolic Panel: Recent Labs  Lab 02/17/20 1611 02/17/20 1900 02/17/20 2048 02/18/20 0516  NA  136 135  --  134*  K 4.0 4.1  --  3.8  CL 100  --   --  101  CO2 25  --   --  23  GLUCOSE 109*  --   --  93  BUN 16  --   --  12  CREATININE 1.18*  --  1.05* 0.97  CALCIUM 8.6*  --   --  8.4*   GFR: CrCl cannot be calculated (Unknown ideal weight.). Liver Function Tests: Recent Labs  Lab 02/17/20 1611 02/18/20 0516  AST 16 14*  ALT 23 19  ALKPHOS 108 95  BILITOT 0.8 0.9  PROT 6.2* 5.4*  ALBUMIN 3.0* 2.5*   Recent Labs  Lab 02/17/20 1611  LIPASE 20   No results for input(s): AMMONIA in the last 168 hours. Coagulation Profile: No results for input(s): INR, PROTIME in the last 168 hours. Cardiac Enzymes: No results for input(s): CKTOTAL, CKMB, CKMBINDEX, TROPONINI in the last 168 hours. BNP (last 3 results) No results for input(s): PROBNP in the last 8760 hours. HbA1C: No results for input(s): HGBA1C in the last 72 hours. CBG: Recent Labs  Lab 02/18/20 1101  GLUCAP 108*   Lipid Profile: Recent Labs    02/17/20 1611  TRIG 166*   Thyroid Function Tests: Recent Labs     02/17/20 2048  TSH 1.722   Anemia Panel: Recent Labs    02/17/20 1611  FERRITIN 68   Urine analysis:    Component Value Date/Time   COLORURINE YELLOW 02/17/2020 2032   APPEARANCEUR CLEAR 02/17/2020 2032   LABSPEC 1.014 02/17/2020 2032   PHURINE 7.0 02/17/2020 2032   GLUCOSEU NEGATIVE 02/17/2020 2032   HGBUR NEGATIVE 02/17/2020 2032   [place] NEGATIVE 02/17/2020 2032   KETONESUR 5 (A) 02/17/2020 2032   PROTEINUR NEGATIVE 02/17/2020 2032   UROBILINOGEN 1.0 03/29/2007 1449   NITRITE NEGATIVE 02/17/2020 2032   LEUKOCYTESUR NEGATIVE 02/17/2020 2032   Sepsis Labs: @LABRCNTIP (procalcitonin:4,lacticacidven:4)  ) Recent Results (from the past 240 hour(s))  Respiratory Panel by RT PCR (Flu A&B, Covid) - Nasopharyngeal Swab     Status: None   Collection Time: 02/17/20  3:33 PM   Specimen: Nasopharyngeal Swab  Result Value Ref Range Status   SARS Coronavirus 2 by RT PCR NEGATIVE NEGATIVE Final    Comment: (NOTE) SARS-CoV-2 target nucleic acids are NOT DETECTED.  The SARS-CoV-2 RNA is generally detectable in upper respiratoy specimens during the acute phase of infection. The lowest concentration of SARS-CoV-2 viral copies this assay can detect is 131 copies/mL. A negative result does not preclude SARS-Cov-2 infection and should not be used as the sole basis for treatment or other patient management decisions. A negative result may occur with  improper specimen collection/handling, submission of specimen other than nasopharyngeal swab, presence of viral mutation(s) within the areas targeted by this assay, and inadequate number of viral copies (<131 copies/mL). A negative result must be combined with clinical observations, patient history, and epidemiological information. The expected result is Negative.  Fact Sheet for Patients:  PinkCheek.be  Fact Sheet for Healthcare Providers:  GravelBags.it  This test is no t  yet approved or cleared by the [place] FDA and  has been authorized for detection and/or diagnosis of SARS-CoV-2 by FDA under an Emergency Use Authorization (EUA). This EUA will remain  in effect (meaning this test can be used) for the duration of the COVID-19 declaration under Section 564(b)(1) of the Act, 21 U.S.C. section 360bbb-3(b)(1), unless the authorization is terminated or  revoked sooner.     Influenza A by PCR NEGATIVE NEGATIVE Final   Influenza B by PCR NEGATIVE NEGATIVE Final    Comment: (NOTE) The Xpert Xpress SARS-CoV-2/FLU/RSV assay is intended as an aid in  the diagnosis of influenza from Nasopharyngeal swab specimens and  should not be used as a sole basis for treatment. Nasal washings and  aspirates are unacceptable for Xpert Xpress SARS-CoV-2/FLU/RSV  testing.  Fact Sheet for Patients: PinkCheek.be  Fact Sheet for Healthcare Providers: GravelBags.it  This test is not yet approved or cleared by the [place] FDA and  has been authorized for detection and/or diagnosis of SARS-CoV-2 by  FDA under an Emergency Use Authorization (EUA). This EUA will remain  in effect (meaning this test can be used) for the duration of the  Covid-19 declaration under Section 564(b)(1) of the Act, 21  U.S.C. section 360bbb-3(b)(1), unless the authorization is  terminated or revoked. Performed at [facility], [place] [address], [place], [place] [address]   Blood Culture (routine x 2)     Status: None (Preliminary result)   Collection Time: 02/17/20  3:34 PM   Specimen: BLOOD LEFT HAND  Result Value Ref Range Status   Specimen Description BLOOD LEFT HAND  Final   Special Requests   Final    BOTTLES DRAWN AEROBIC AND ANAEROBIC Blood Culture adequate volume   Culture   Final    NO GROWTH < 24 HOURS Performed at [facility], [place] [address], [place], [place] [address]    Report Status PENDING  Incomplete   Blood Culture (routine x 2)     Status: None (Preliminary result)   Collection Time: 02/17/20  4:11 PM   Specimen: BLOOD  Result Value Ref Range Status   Specimen Description BLOOD RIGHT ANTECUBITAL  Final   Special Requests   Final    BOTTLES DRAWN AEROBIC AND ANAEROBIC Blood Culture results may not be optimal due to an excessive volume of blood received in culture bottles   Culture   Final    NO GROWTH < 24 HOURS Performed at [facility], [place] [address], [place], [place] [address]    Report Status PENDING  Incomplete         Radiology Studies: CT Head Wo Contrast  Result Date: 02/17/2020 CLINICAL DATA:  Mental status changes, COVID-19 positivity EXAM: CT HEAD WITHOUT CONTRAST TECHNIQUE: Contiguous axial images were obtained from the base of the skull through the vertex without intravenous contrast. COMPARISON:  None. FINDINGS: Brain: No evidence of acute infarction, hemorrhage, hydrocephalus, extra-axial collection or mass lesion/mass effect. Vascular: No hyperdense vessel or unexpected calcification. Skull: Normal. Negative for fracture or focal lesion. Sinuses/Orbits: No acute finding. Other: None. IMPRESSION: No acute intracranial abnormality noted. Electronically Signed   By: Inez Catalina M.D.   On: 02/17/2020 16:52   DG Chest Port 1 View  Result Date: 02/17/2020 CLINICAL DATA:  COVID-19 positivity with confusion, initial encounter EXAM: PORTABLE CHEST 1 VIEW COMPARISON:  02/11/2020 FINDINGS: Cardiac shadow is stable. Aortic calcifications are again seen. No acute infiltrate or sizable effusion is seen. Postsurgical changes are noted. No bony abnormality is seen. IMPRESSION: No active disease. Electronically Signed   By: Inez Catalina M.D.   On: 02/17/2020 16:04        Scheduled Meds: . amLODipine  5 mg Oral Daily  . enoxaparin (LOVENOX) injection  40 mg Subcutaneous Q24H  . fluticasone furoate-vilanterol  1 puff Inhalation Daily  . levothyroxine  25 mcg Oral Daily   . sodium chloride flush  3 mL Intravenous Q12H   Continuous Infusions: . lactated ringers 150 mL/hr at 02/18/20 0401     LOS: 1 day    Time spent: 35 minutes    Edwin Dada, MD [facility] 02/18/2020, 1:25 PM     Please page though [facility] or Epic secure chat:  For [employer], Adult nurse

## 2020-02-18 NOTE — ED Notes (Signed)
Pt placed on bed alarm

## 2020-02-18 NOTE — ED Notes (Signed)
Repositioned Pt and placed on placed on 2L and putting on new pulse ox to getting better reading and now at 98%

## 2020-02-18 NOTE — ED Notes (Signed)
Attempted to call Sister back x2 but phone is busy. MRI had also attempted and were unable to get an answer

## 2020-02-19 ENCOUNTER — Inpatient Hospital Stay (HOSPITAL_COMMUNITY): Payer: PPO

## 2020-02-19 LAB — CBC
HCT: 29.6 % — ABNORMAL LOW (ref 36.0–46.0)
Hemoglobin: 9.7 g/dL — ABNORMAL LOW (ref 12.0–15.0)
MCH: 26.4 pg (ref 26.0–34.0)
MCHC: 32.8 g/dL (ref 30.0–36.0)
MCV: 80.7 fL (ref 80.0–100.0)
Platelets: 482 10*3/uL — ABNORMAL HIGH (ref 150–400)
RBC: 3.67 MIL/uL — ABNORMAL LOW (ref 3.87–5.11)
RDW: 16.6 % — ABNORMAL HIGH (ref 11.5–15.5)
WBC: 12.3 10*3/uL — ABNORMAL HIGH (ref 4.0–10.5)
nRBC: 0 % (ref 0.0–0.2)

## 2020-02-19 LAB — RPR: RPR Ser Ql: NONREACTIVE

## 2020-02-19 LAB — COMPREHENSIVE METABOLIC PANEL
ALT: 19 U/L (ref 0–44)
AST: 15 U/L (ref 15–41)
Albumin: 2.6 g/dL — ABNORMAL LOW (ref 3.5–5.0)
Alkaline Phosphatase: 97 U/L (ref 38–126)
Anion gap: 9 (ref 5–15)
BUN: 16 mg/dL (ref 8–23)
CO2: 24 mmol/L (ref 22–32)
Calcium: 8.5 mg/dL — ABNORMAL LOW (ref 8.9–10.3)
Chloride: 102 mmol/L (ref 98–111)
Creatinine, Ser: 0.99 mg/dL (ref 0.44–1.00)
GFR, Estimated: 54 mL/min — ABNORMAL LOW (ref >60)
Glucose, Bld: 88 mg/dL (ref 70–99)
Potassium: 3.9 mmol/L (ref 3.5–5.1)
Sodium: 135 mmol/L (ref 135–145)
Total Bilirubin: 0.8 mg/dL (ref 0.3–1.2)
Total Protein: 5.5 g/dL — ABNORMAL LOW (ref 6.5–8.1)

## 2020-02-19 LAB — HIV ANTIBODY (ROUTINE TESTING W REFLEX): HIV Screen 4th Generation wRfx: NONREACTIVE

## 2020-02-19 LAB — URINE CULTURE: Culture: <10000 — AB

## 2020-02-19 LAB — AMMONIA: Ammonia: 20 umol/L (ref 9–35)

## 2020-02-19 LAB — GLUCOSE, CAPILLARY: Glucose-Capillary: 78 mg/dL (ref 70–99)

## 2020-02-19 LAB — MRSA PCR SCREENING: MRSA by PCR: NEGATIVE

## 2020-02-19 MED ORDER — ASPIRIN EC 81 MG PO TBEC
81.0000 mg | DELAYED_RELEASE_TABLET | Freq: Every day | ORAL | Status: DC
  Administered 2020-02-19 – 2020-02-21 (×3): 81 mg via ORAL
  Filled 2020-02-19 (×3): qty 1

## 2020-02-19 MED ORDER — IOHEXOL 350 MG/ML SOLN
75.0000 mL | Freq: Once | INTRAVENOUS | Status: AC | PRN
  Administered 2020-02-19: 75 mL via INTRAVENOUS

## 2020-02-19 MED ORDER — PRAVASTATIN SODIUM 40 MG PO TABS
80.0000 mg | ORAL_TABLET | Freq: Every day | ORAL | Status: DC
  Administered 2020-02-19 – 2020-02-20 (×2): 80 mg via ORAL
  Filled 2020-02-19 (×2): qty 2

## 2020-02-19 MED ORDER — LEVOTHYROXINE SODIUM 25 MCG PO TABS: 25.0000 ug | ORAL_TABLET | Freq: Every day | ORAL | Status: DC

## 2020-02-19 MED ORDER — IRBESARTAN 75 MG PO TABS
37.5000 mg | ORAL_TABLET | Freq: Every day | ORAL | Status: DC
  Administered 2020-02-19 – 2020-02-21 (×3): 37.5 mg via ORAL
  Filled 2020-02-19 (×3): qty 0.5

## 2020-02-19 MED ORDER — SODIUM CHLORIDE 0.9 % IV SOLN: INTRAVENOUS | Status: AC

## 2020-02-19 MED ORDER — SODIUM CHLORIDE 0.9 % IV SOLN: INTRAVENOUS | Status: DC

## 2020-02-19 NOTE — Progress Notes (Signed)
New Admission Note:  Arrival Method: Bed Mental Orientation: A&O x1 Telemetry: yes Assessment: Completed Skin: Intact IV: Right AC/Left FA Pain: None Safety Measures: Safety Fall Prevention Plan Admission: Completed 3W: Patient has been orientated to the room, unit and the staff. Family: None @ bedside  Orders have been reviewed and implemented. Will continue to monitor the patient. Call light has been placed within reach and bed alarm has been activated.   Lacretia Leigh, RN

## 2020-02-19 NOTE — Evaluation (Signed)
Physical Therapy Evaluation Patient Details Name: Joann Barnes MRN: [id number] DOB: 1941/02/14 Today's Date: 02/19/2020   History of Present Illness  Joann Barnes is a 79 y.o. F with COPD not on O2, HTN, hypothyroidism, PLIF 2019, and smoking who presented with confusion thought to be due to polypharmacy. Of note, pt exposed to sister who had covid and received monoclonal Abs/REGEN-COV. Covid test neg upon admission   Clinical Impression  Pt admitted with above diagnosis. Pt restless and confused on eval. Soiled with stool in bed with telemetry box between her legs and in stool, pt unaware. Mod A to come to sitting EOB. Pt stood EOB with mod A but maintained standing <1 min before sitting suddenly back to bed and trying to lie back down due to fatigue. SpO2 mid 90's on RA with HR in 110's. Based on presentation today, cannot recommend that pt return home alone so recommending SNF. However, if cognition clears tomorrow, physical mobility may be very different. Will plan to check back on her for progress.   Pt currently with functional limitations due to the deficits listed below (see PT Problem List). Pt will benefit from skilled PT to increase their independence and safety with mobility to allow discharge to the venue listed below.       Follow Up Recommendations SNF;Supervision/Assistance - 24 hour (but anticipate upgrading as cognition improves)    Equipment Recommendations  None recommended by PT    Recommendations for Other Services       Precautions / Restrictions Precautions Precautions: Fall Precaution Comments: reports falling at home Restrictions Weight Bearing Restrictions: No      Mobility  Bed Mobility Overal bed mobility: Needs Assistance Bed Mobility: Supine to Sit;Sit to Supine     Supine to sit: Mod assist Sit to supine: Mod assist   General bed mobility comments: mod HHA to come to EOB, max instructional cues needed. Once pt fatigued after standing she "flopped"  her trunk back into the bed and brought RLE into bed but needed mod A to place LLE in bed  Transfers Overall transfer level: Needs assistance Equipment used: None Transfers: Sit to/from Stand Sit to Stand: Mod assist         General transfer comment: mod A for power up. Pt could maintain <1 min before sitting suddenly and reporting fatigue  Ambulation/Gait             General Gait Details: unable to perform safely today  Stairs            Wheelchair Mobility    Modified Rankin (Stroke Patients Only)       Balance Overall balance assessment: Needs assistance Sitting-balance support: Single extremity supported;Feet supported Sitting balance-Leahy Scale: Fair     Standing balance support: Bilateral upper extremity supported Standing balance-Leahy Scale: Poor Standing balance comment: heavily reliant on UE support                             Pertinent Vitals/Pain Pain Assessment: No/denies pain    Home Living Family/patient expects to be discharged to:: Private residence Living Arrangements: Alone   Type of Home: House Home Access: Stairs to enter   Technical brewer of Steps: 5 Home Layout: One level Home Equipment: Walker - 4 wheels Additional Comments: home info obtained from chart from previous admission, pt cannot give any reliable info today    Prior Function Level of Independence: Independent  Comments: per chart was of normal state of mid one day prior to admission     Hand Dominance        Extremity/Trunk Assessment   Upper Extremity Assessment Upper Extremity Assessment: Defer to OT evaluation    Lower Extremity Assessment Lower Extremity Assessment: Generalized weakness;RLE deficits/detail;LLE deficits/detail;Difficult to assess due to impaired cognition RLE Deficits / Details: pt able to move LE's in bed and perform SLR but with wt on her LE's in standing, could not maintain knee and hip extension RLE  Sensation: WNL RLE Coordination: WNL LLE Deficits / Details: appears equal to RLE LLE Sensation: WNL LLE Coordination: WNL    Cervical / Trunk Assessment Cervical / Trunk Assessment: Other exceptions Cervical / Trunk Exceptions: h/o PLIF 2019  Communication   Communication: Expressive difficulties  Cognition Arousal/Alertness: Awake/alert Behavior During Therapy: Restless Overall Cognitive Status: Impaired/Different from baseline Area of Impairment: Orientation;Attention;Memory;Following commands;Safety/judgement;Awareness;Problem solving                 Orientation Level: Disoriented to;Situation;Time;Place Current Attention Level: Sustained Memory: Decreased short-term memory;Decreased recall of precautions Following Commands: Follows one step commands inconsistently Safety/Judgement: Decreased awareness of safety;Decreased awareness of deficits Awareness: Intellectual Problem Solving: Decreased initiation;Difficulty sequencing;Requires tactile cues General Comments: pt thinks it's January. Upon PT entry she was lying in stool with her telebox between her legs in the stool. Pt unaware of either situation. Very restless in bed. Having word finding difficulties. Difficulty completing a full sentence and self distracted      General Comments General comments (skin integrity, edema, etc.): pt not placed in recliner as it would be unsafe today given pt's restlessness    Exercises     Assessment/Plan    PT Assessment Patient needs continued PT services  PT Problem List Decreased strength;Decreased activity tolerance;Decreased balance;Decreased mobility;Decreased cognition;Decreased safety awareness;Decreased knowledge of precautions       PT Treatment Interventions DME instruction;Gait training;Stair training;Functional mobility training;Therapeutic activities;Therapeutic exercise;Balance training;Neuromuscular re-education;Cognitive remediation;Patient/family education     PT Goals (Current goals can be found in the Care Plan section)  Acute Rehab PT Goals Patient Stated Goal: go home PT Goal Formulation: With patient Time For Goal Achievement: 03/04/20 Potential to Achieve Goals: Good    Frequency Min 3X/week   Barriers to discharge Decreased caregiver support lives alone    Co-evaluation               AM-PAC PT "6 Clicks" Mobility  Outcome Measure Help needed turning from your back to your side while in a flat bed without using bedrails?: None Help needed moving from lying on your back to sitting on the side of a flat bed without using bedrails?: A Little Help needed moving to and from a bed to a chair (including a wheelchair)?: A Lot Help needed standing up from a chair using your arms (e.g., wheelchair or bedside chair)?: A Lot Help needed to walk in hospital room?: Total Help needed climbing 3-5 steps with a railing? : Total 6 Click Score: 13    End of Session   Activity Tolerance: Patient limited by fatigue Patient left: in bed;with call bell/phone within reach;with bed alarm set Nurse Communication: Mobility status PT Visit Diagnosis: Muscle weakness (generalized) (M62.81);Difficulty in walking, not elsewhere classified (R26.2)    Time: 0623-7628 PT Time Calculation (min) (ACUTE ONLY): 31 min   Charges:   PT Evaluation $PT Eval Moderate Complexity: 1 Mod PT Treatments $Therapeutic Activity: 8-22 mins        Maisen Schmit,  PT  Acute Rehab Services  Pager [phone] Office [place] 02/19/2020, 3:57 PM

## 2020-02-19 NOTE — Progress Notes (Signed)
[facility] PROGRESS NOTE    [name]  WFU:[id number] DOB: 09/17/1940 DOA: 02/17/2020 PCP: Merrilee Seashore, MD      Brief Narrative:  Joann Barnes is a 79 y.o. F with COPD not on O2, HTN, hypothyroidism, and smoking who presented with confusion.  Evidently, patient exposed to her sister 2 weeks ago who had COVID.  Developed URI symptoms, received monoclonal Abs/REGEN-COV despite no positive COVID test.  Continued to have dyspnea on exertion, seen in the ER 1 week prior to admission, prescribed prednisone burst which she completed.  Evidently, patient lives alone and was oriented the day prior to admission. The day of admission, patient was confused, family brought her to the ER.  In the ER, CT head normal.  CXR clear and UA without inflammation.  COVID negative, procal undetectable.  UDS positive for opiates and benzodiazepines.  WBC 17K, electrolytes normal, renal function at baseline.  She was started on broad spectrum antibiotics and the hospitalists were consulted for work up.       Assessment & Plan:  Acute metabolic encephalopathy Patient admitted and started on IV fluids and telemetry.  UDS showed benzodiazepines and opiates detected (review of PDMP shows she had a recent new Norco rx, but also old Valium and Tramadol scripts).  Was also recently on steroids 50 mg pred for 5 days.  Lastly, she has amitriptyline, and some of her symptoms of tachycardia, confusion, xerostomia and mydriasis are consistent with a anti-cholinergic/TCA toxidrome.  Stroke ruled out with MRI.  Status epilepticus ruled out.  TSH and RPR normal.  B12 pending.  No fever to suspect infection, no symptoms to suggest CNS infection.  Improving today with med holiday and IV fluids.  -Hold all CNS active meds  -Continue IV fluids today -Continue tele -PT eval   Atrial fibrillation ruled out ECG shows sinus tachycardia with frequent PACs    HTN BP slightly high   -Contineu amlodipine -Restart ARB  Hypothyroidism TSH normal -Contineu levothyroxine  COPD without exacerbation No acute disease -Continue ICS LABA -Hold LAMA  CKD IIIa Cr stable relative to baseline  Hyponatremia Na 134 today, mild, asymptomatic.  Anemia, unknown cause, microcytic Ferritin low normal -Check iron studies  Anisocoria From Chart review, this is old      Disposition: Status is: Inpatient  Remains inpatient appropriate because:Altered mental status   Dispo: The patient is from: Home              Anticipated d/c is to: TBD              Anticipated d/c date is: 3 days              Patient currently is not medically stable to d/c.     Patient admitted with altered mental status.  She remains altered and disoriented to place and not at baseline, although slightly better.  I favor that this is a toxidrome.           MDM: The below labs and imaging reports were reviewed and summarized above.  Medication management as above.       DVT prophylaxis: enoxaparin (LOVENOX) injection 40 mg Start: 02/17/20 2200  Code Status: FULL Family Communication: Called Sister by phone twice, no answer    Consultants:   none  Procedures:   CT head unremarkable  MRI brain unremarkable  EEG unremarkable  Antimicrobials:   Cefepime, Flagyl, vancomycin once10/17  Culture data:   10/17 blood culture  No growth  10/17 urine culture-insignificant growth          Subjective: The patient is feeling better.  She is still somewhat confused, thinks it is "January", but knows her name and where she is.  No fever, no cough or respiratory symptoms.  No abdominal symptoms, rashes, urinary symptoms.        Objective: Vitals:   02/19/20 0736 02/19/20 1013 02/19/20 1100 02/19/20 1115  BP: (!) 155/61 (!) 145/52 (!) 163/56 (!) 156/61  Pulse: 90  85 76  Resp: 18  16 18   Temp: 97.8 F (36.6 C)  97.7 F (36.5 C) 98.1 F (36.7 C)  TempSrc:  Oral  Oral Oral  SpO2: 97%  100% 92%   No intake or output data in the 24 hours ending 02/19/20 1215 There were no vitals filed for this visit.  Examination: General appearance: Elderly female, lying in bed, no acute distress HEENT: Anicteric, conjunctival pink, lids lashes normal.  No nasal deformity, discharge, epistaxis.  Lips dry, dentition in normal repair, oropharynx still very dry, no oral lesions, hearing normal Skin: Skin warm and dry, no suspicious rashes or lesions. Cardiac: Tachycardic, occasional premature beats, no murmurs.  JVP not visible.  No lower extremity edema at all.  Radial pulses 2+ and symmetric. Respiratory: Respiratory effort normal, lungs clear without rales or wheezes. Abdomen: Abdomen soft without tenderness palpation or guarding.  No ascites or distention. MSK: Normal muscle bulk and tone for age.  No joint swelling or effusions. Neuro: Extraocular movements intact, mild anisocoria, stable.  Pupils reactive.  Face symmetric.  Speech fluent.  Oriented to self and place.  Strength symmetric in upper and lower extremities bilaterally, normal sensation to light touch. Psych: Attention improved but slightly abnormal, affect seems normal, judgment and insight mildly impaired      Data Reviewed: I have personally reviewed following labs and imaging studies:  CBC: Recent Labs  Lab 02/17/20 1611 02/17/20 1900 02/17/20 2048 02/18/20 0516 02/19/20 0615  WBC 17.1*  --  16.4* 14.3* 12.3*  NEUTROABS 13.8*  --   --   --   --   HGB 10.9* 10.9* 10.4* 10.2* 9.7*  HCT 34.1* 32.0* 32.6* 32.0* 29.6*  MCV 82.8  --  82.3 81.0 80.7  PLT 510*  --  476* 443* 892*   Basic Metabolic Panel: Recent Labs  Lab 02/17/20 1611 02/17/20 1900 02/17/20 2048 02/18/20 0516 02/19/20 0615  NA 136 135  --  134* 135  K 4.0 4.1  --  3.8 3.9  CL 100  --   --  101 102  CO2 25  --   --  23 24  GLUCOSE 109*  --   --  93 88  BUN 16  --   --  12 16  CREATININE 1.18*  --  1.05* 0.97  0.99  CALCIUM 8.6*  --   --  8.4* 8.5*   GFR: CrCl cannot be calculated (Unknown ideal weight.). Liver Function Tests: Recent Labs  Lab 02/17/20 1611 02/18/20 0516 02/19/20 0615  AST 16 14* 15  ALT 23 19 19   ALKPHOS 108 95 97  BILITOT 0.8 0.9 0.8  PROT 6.2* 5.4* 5.5*  ALBUMIN 3.0* 2.5* 2.6*   Recent Labs  Lab 02/17/20 1611  LIPASE 20   Recent Labs  Lab 02/19/20 0615  AMMONIA 20   Coagulation Profile: No results for input(s): INR, PROTIME in the last 168 hours. Cardiac Enzymes: No results for input(s): CKTOTAL, CKMB, CKMBINDEX, TROPONINI in the last 168 hours.  BNP (last 3 results) No results for input(s): PROBNP in the last 8760 hours. HbA1C: No results for input(s): HGBA1C in the last 72 hours. CBG: Recent Labs  Lab 02/18/20 1101 02/19/20 0604  GLUCAP 108* 78   Lipid Profile: Recent Labs    02/17/20 1611  TRIG 166*   Thyroid Function Tests: Recent Labs    02/17/20 2048  TSH 1.722   Anemia Panel: Recent Labs    02/17/20 1611  FERRITIN 68   Urine analysis:    Component Value Date/Time   COLORURINE YELLOW 02/17/2020 2032   APPEARANCEUR CLEAR 02/17/2020 2032   LABSPEC 1.014 02/17/2020 2032   PHURINE 7.0 02/17/2020 2032   GLUCOSEU NEGATIVE 02/17/2020 2032   HGBUR NEGATIVE 02/17/2020 2032   [place] NEGATIVE 02/17/2020 2032   KETONESUR 5 (A) 02/17/2020 2032   PROTEINUR NEGATIVE 02/17/2020 2032   UROBILINOGEN 1.0 03/29/2007 1449   NITRITE NEGATIVE 02/17/2020 2032   LEUKOCYTESUR NEGATIVE 02/17/2020 2032   Sepsis Labs: @LABRCNTIP (procalcitonin:4,lacticacidven:4)  ) Recent Results (from the past 240 hour(s))  Respiratory Panel by RT PCR (Flu A&B, Covid) - Nasopharyngeal Swab     Status: None   Collection Time: 02/17/20  3:33 PM   Specimen: Nasopharyngeal Swab  Result Value Ref Range Status   SARS Coronavirus 2 by RT PCR NEGATIVE NEGATIVE Final    Comment: (NOTE) SARS-CoV-2 target nucleic acids are NOT DETECTED.  The SARS-CoV-2 RNA is  generally detectable in upper respiratoy specimens during the acute phase of infection. The lowest concentration of SARS-CoV-2 viral copies this assay can detect is 131 copies/mL. A negative result does not preclude SARS-Cov-2 infection and should not be used as the sole basis for treatment or other patient management decisions. A negative result may occur with  improper specimen collection/handling, submission of specimen other than nasopharyngeal swab, presence of viral mutation(s) within the areas targeted by this assay, and inadequate number of viral copies (<131 copies/mL). A negative result must be combined with clinical observations, patient history, and epidemiological information. The expected result is Negative.  Fact Sheet for Patients:  PinkCheek.be  Fact Sheet for Healthcare Providers:  GravelBags.it  This test is no t yet approved or cleared by the [place] FDA and  has been authorized for detection and/or diagnosis of SARS-CoV-2 by FDA under an Emergency Use Authorization (EUA). This EUA will remain  in effect (meaning this test can be used) for the duration of the COVID-19 declaration under Section 564(b)(1) of the Act, 21 U.S.C. section 360bbb-3(b)(1), unless the authorization is terminated or revoked sooner.     Influenza A by PCR NEGATIVE NEGATIVE Final   Influenza B by PCR NEGATIVE NEGATIVE Final    Comment: (NOTE) The Xpert Xpress SARS-CoV-2/FLU/RSV assay is intended as an aid in  the diagnosis of influenza from Nasopharyngeal swab specimens and  should not be used as a sole basis for treatment. Nasal washings and  aspirates are unacceptable for Xpert Xpress SARS-CoV-2/FLU/RSV  testing.  Fact Sheet for Patients: PinkCheek.be  Fact Sheet for Healthcare Providers: GravelBags.it  This test is not yet approved or cleared by the [place] FDA and  has been authorized for detection and/or diagnosis of SARS-CoV-2 by  FDA under an Emergency Use Authorization (EUA). This EUA will remain  in effect (meaning this test can be used) for the duration of the  Covid-19 declaration under Section 564(b)(1) of the Act, 21  U.S.C. section 360bbb-3(b)(1), unless the authorization is  terminated or revoked. Performed at [facility],  [address] [place], [place] [address]   Blood Culture (routine x 2)     Status: None (Preliminary result)   Collection Time: 02/17/20  3:34 PM   Specimen: BLOOD LEFT HAND  Result Value Ref Range Status   Specimen Description BLOOD LEFT HAND  Final   Special Requests   Final    BOTTLES DRAWN AEROBIC AND ANAEROBIC Blood Culture adequate volume   Culture   Final    NO GROWTH 2 DAYS Performed at [facility], [place] [address] [place], [place] [address]    Report Status PENDING  Incomplete  Blood Culture (routine x 2)     Status: None (Preliminary result)   Collection Time: 02/17/20  4:11 PM   Specimen: BLOOD  Result Value Ref Range Status   Specimen Description BLOOD RIGHT ANTECUBITAL  Final   Special Requests   Final    BOTTLES DRAWN AEROBIC AND ANAEROBIC Blood Culture results may not be optimal due to an excessive volume of blood received in culture bottles   Culture   Final    NO GROWTH 2 DAYS Performed at [facility], [place] [address], [place], [place] [address]    Report Status PENDING  Incomplete  Urine culture     Status: Abnormal   Collection Time: 02/17/20  8:13 PM   Specimen: Urine, Clean Catch  Result Value Ref Range Status   Specimen Description URINE, CLEAN CATCH  Final   Special Requests NONE  Final   Culture (A)  Final    <10,000 COLONIES/mL INSIGNIFICANT GROWTH Performed at [facility], [place] [address], [place],  [address]    Report Status 02/19/2020 FINAL  Final         Radiology Studies: EEG  Result Date: 02/18/2020 Lora Havens, MD     02/18/2020  4:29 PM Patient Name: Joann Barnes MRN: [id number] Epilepsy Attending: Lora Havens Referring Physician/Provider: Dr Myrene Buddy Date: 02/17/2020 Duration: 23.48 mins Patient history: 79yo F with ams. EEG to evaluate for seizure. Level of alertness: Awake AEDs during EEG study: None Technical aspects: This EEG study was done with scalp electrodes positioned according to the 10-20 International system of electrode placement. Electrical activity was acquired at a sampling rate of 500Hz  and reviewed with a high frequency filter of 70Hz  and a low frequency filter of 1Hz . EEG data were recorded continuously and digitally stored. Description: No posterior dominant rhythm was seen.  EEG showed continuous generalized 3 to 6 Hz theta-delta slowing.  Hyperventilation and photic stimulation were not performed.   EEG was technically difficult due to significant eye flutter artifact was noted. ABNORMALITY -Continuous slow, generalized IMPRESSION: This study is suggestive of mild to moderate diffuse encephalopathy, nonspecific etiology. No seizures or epileptiform discharges were seen throughout the recording. Lora Havens   CT Head Wo Contrast  Result Date: 02/17/2020 CLINICAL DATA:  Mental status changes, COVID-19 positivity EXAM: CT HEAD WITHOUT CONTRAST TECHNIQUE: Contiguous axial images were obtained from the base of the skull through the vertex without intravenous contrast. COMPARISON:  None. FINDINGS: Brain: No evidence of acute infarction, hemorrhage, hydrocephalus, extra-axial collection or mass lesion/mass effect. Vascular: No hyperdense vessel or unexpected calcification. Skull: Normal. Negative for fracture or focal lesion. Sinuses/Orbits: No acute finding. Other: None. IMPRESSION: No acute intracranial abnormality noted. Electronically Signed   By: Inez Catalina M.D.   On: 02/17/2020 16:52   MR BRAIN WO CONTRAST  Result Date: 02/18/2020 CLINICAL DATA:  Mental  status change, unknown cause. EXAM: MRI HEAD WITHOUT CONTRAST TECHNIQUE: Multiplanar, multiecho pulse sequences of the brain and surrounding structures were obtained without intravenous contrast. COMPARISON:  Noncontrast head CT 02/17/2020.  Brain MRI 04/23/2014 FINDINGS: Brain: The patient was not able to tolerate the full examination. As a result, the routine axial T1 weighted sequence and coronal T2 weighted sequence could not be obtained. Intermittent motion degradation. Most notably, there is moderate motion degradation of the sagittal T1 weighted sequence. Mild generalized cerebral atrophy. Minimal scattered white matter and periventricular T2/FLAIR hyperintensity, nonspecific but compatible with chronic small vessel ischemic disease. There is no acute infarct. No evidence of intracranial mass. No chronic intracranial blood products. No extra-axial fluid collection. No midline shift. Vascular: Expected proximal arterial flow voids. Skull and upper cervical spine: No focal marrow lesion. Susceptibility artifact arising from cervical spinal fusion hardware. Sinuses/Orbits: Visualized orbits show no acute finding. Mucosal thickening and air-fluid level within the left maxillary sinus. Frothy secretions and mild mucosal thickening within the left sphenoid sinus. Trace fluid within bilateral mastoid air cells. IMPRESSION: Prematurely terminated and motion degraded examination. No evidence of acute intracranial abnormality, including acute infarction. Mild generalized cerebral atrophy and chronic small vessel ischemic disease. Left sphenoid and maxillary sinusitis. Small bilateral mastoid effusions. Electronically Signed   By: Kellie Simmering DO   On: 02/18/2020 14:25   DG Chest Port 1 View  Result Date: 02/17/2020 CLINICAL DATA:  COVID-19 positivity with confusion, initial encounter EXAM: PORTABLE CHEST 1 VIEW COMPARISON:  02/11/2020 FINDINGS: Cardiac shadow is stable. Aortic calcifications are again seen. No  acute infiltrate or sizable effusion is seen. Postsurgical changes are noted. No bony abnormality is seen. IMPRESSION: No active disease. Electronically Signed   By: Inez Catalina M.D.   On: 02/17/2020 16:04        Scheduled Meds: . amLODipine  5 mg Oral Daily  . aspirin  81 mg Oral Daily  . enoxaparin (LOVENOX) injection  40 mg Subcutaneous Q24H  . fluticasone furoate-vilanterol  1 puff Inhalation Daily  . irbesartan  37.5 mg Oral Daily  . levothyroxine  25 mcg Oral Daily  . [START ON 02/20/2020] levothyroxine  25 mcg Oral Daily  . pravastatin  80 mg Oral QHS  . sodium chloride flush  3 mL Intravenous Q12H   Continuous Infusions: . sodium chloride 125 mL/hr at 02/19/20 0524     LOS: 2 days    Time spent: 25 minutes    Edwin Dada, MD [facility] 02/19/2020, 12:15 PM     Please page though [place] or Epic secure chat:  For [employer], Adult nurse

## 2020-02-19 NOTE — Progress Notes (Signed)
Called about a witnessed fall.   Patient seen and examined at the bedside. She is unchanged from earlier today. Evidently she was sitting on the bedside commode, stood up without assistance, and fell landing on her bottom. No head impact.   On exam she is oriented to self and situation, remembers the fall. She is still slightly disoriented and impulsive. She has no point tenderness on the shoulders, arms, ribs, hips, legs or  head.    I have no suspicion for head bleed.  I have no suspicion for fracture.   She has been put back on oxygen, and nursing note that she is out of breath, which she affirms. Given her previous elevated dimer, I think it reasonable to obtain a CTPE study now. Otherwise continue previous plan of care.

## 2020-02-19 NOTE — Progress Notes (Signed)
Patient fell from the bedside commode to the floor without assistance. Nurse Tech Coppell witness the fall.  He assisted the patient to the bedside commode but patient did not call,  stood up and fell on the floor while trying to get back to her bed by herself.  Patient told RN that she felt  tired sitting on the bedside commode and  wanted lo lie down in bed.  Vital signs taken after the fall. Temp 98.4, Pulse 95 ,bp 138/65,  99 % on room air.  Dr.  Loleta Books notified of the fall event.

## 2020-02-19 NOTE — Plan of Care (Signed)

## 2020-02-20 ENCOUNTER — Other Ambulatory Visit: Payer: Self-pay

## 2020-02-20 LAB — CBC
HCT: 29.1 % — ABNORMAL LOW (ref 36.0–46.0)
Hemoglobin: 9.1 g/dL — ABNORMAL LOW (ref 12.0–15.0)
MCH: 25.7 pg — ABNORMAL LOW (ref 26.0–34.0)
MCHC: 31.3 g/dL (ref 30.0–36.0)
MCV: 82.2 fL (ref 80.0–100.0)
Platelets: 515 10*3/uL — ABNORMAL HIGH (ref 150–400)
RBC: 3.54 MIL/uL — ABNORMAL LOW (ref 3.87–5.11)
RDW: 16.8 % — ABNORMAL HIGH (ref 11.5–15.5)
WBC: 11.1 10*3/uL — ABNORMAL HIGH (ref 4.0–10.5)
nRBC: 0 % (ref 0.0–0.2)

## 2020-02-20 LAB — BASIC METABOLIC PANEL
Anion gap: 12 (ref 5–15)
BUN: 15 mg/dL (ref 8–23)
CO2: 21 mmol/L — ABNORMAL LOW (ref 22–32)
Calcium: 8.1 mg/dL — ABNORMAL LOW (ref 8.9–10.3)
Chloride: 106 mmol/L (ref 98–111)
Creatinine, Ser: 1.06 mg/dL — ABNORMAL HIGH (ref 0.44–1.00)
GFR, Estimated: 50 mL/min — ABNORMAL LOW (ref >60)
Glucose, Bld: 69 mg/dL — ABNORMAL LOW (ref 70–99)
Potassium: 3.8 mmol/L (ref 3.5–5.1)
Sodium: 139 mmol/L (ref 135–145)

## 2020-02-20 LAB — IRON AND TIBC
Iron: 37 ug/dL (ref 28–170)
Saturation Ratios: 13 % (ref 10.4–31.8)
TIBC: 276 ug/dL (ref 250–450)
UIBC: 239 ug/dL

## 2020-02-20 LAB — VITAMIN B12: Vitamin B-12: 188 pg/mL (ref 180–914)

## 2020-02-20 MED ORDER — AZITHROMYCIN 250 MG PO TABS
250.0000 mg | ORAL_TABLET | Freq: Every day | ORAL | Status: DC
  Administered 2020-02-21: 250 mg via ORAL
  Filled 2020-02-20: qty 1

## 2020-02-20 MED ORDER — AMOXICILLIN 500 MG PO CAPS: 500.0000 mg | ORAL_CAPSULE | Freq: Three times a day (TID) | ORAL | Status: DC

## 2020-02-20 MED ORDER — ROPINIROLE HCL 0.25 MG PO TABS
0.2500 mg | ORAL_TABLET | Freq: Once | ORAL | Status: AC
  Administered 2020-02-20: 0.25 mg via ORAL
  Filled 2020-02-20: qty 1

## 2020-02-20 MED ORDER — AZITHROMYCIN 500 MG PO TABS
500.0000 mg | ORAL_TABLET | Freq: Every day | ORAL | Status: AC
  Administered 2020-02-20: 500 mg via ORAL
  Filled 2020-02-20: qty 1

## 2020-02-20 MED ORDER — MAGNESIUM HYDROXIDE 400 MG/5ML PO SUSP
15.0000 mL | Freq: Once | ORAL | Status: AC
  Administered 2020-02-20: 15 mL via ORAL
  Filled 2020-02-20: qty 30

## 2020-02-20 MED ORDER — OXYCODONE HCL 5 MG PO TABS: 2.5000 mg | ORAL_TABLET | Freq: Once | ORAL | Status: DC

## 2020-02-20 NOTE — TOC Initial Note (Signed)
Transition of Care [facility]) - Initial/Assessment Note    Patient Details  Name: Joann Barnes MRN: [id number] Date of Birth: 07-14-1940  Transition of Care [facility] CM/SW Contact:    Marney Setting, [place] Work Phone Number: 02/20/2020, 3:20 PM  Clinical Narrative:  MSW Student provided bed offers to patient. Patient wanted [facility] but they do not have beds. Patient wants placement close to home. Closest facilities are [facility] and [facility]. Patient and sister show interest in [facility]. MSW to follow on bed availability.                  Expected Discharge Plan: Skilled Nursing Facility Barriers to Discharge: Insurance Authorization   Patient Goals and CMS Choice Patient states their goals for this hospitalization and ongoing recovery are:: To get rehab CMS Medicare.gov Compare Post Acute Care list provided to:: Patient Choice offered to / list presented to : Patient  Expected Discharge Plan and Services Expected Discharge Plan: [place] Choice: [place] arrangements for the past 2 months: Single Family Home                                      Prior Living Arrangements/Services Living arrangements for the past 2 months: Single Family Home Lives with:: Siblings Patient language and need for interpreter reviewed:: No Do you feel safe going back to the place where you live?: Yes      Need for Family Participation in Patient Care: Yes (Comment) Care giver support system in place?: No (comment)   Criminal Activity/Legal Involvement Pertinent to Current Situation/Hospitalization: No - Comment as needed  Activities of Daily Living      Permission Sought/Granted Permission sought to share information with : Facility Sport and exercise psychologist, Tourist information centre manager, Family Supports Permission granted to share information with : Yes, Verbal Permission Granted  Share Information with NAME: Joann Barnes  Permission granted  to share info w AGENCY: SNF  Permission granted to share info w Relationship: Sister     Emotional Assessment Appearance:: Appears stated age Attitude/Demeanor/Rapport: Engaged Affect (typically observed): Appropriate, Pleasant, Hopeful Orientation: : Oriented to Self, Oriented to Place, Oriented to  Time Alcohol / Substance Use: Not Applicable Psych Involvement: No (comment)  Admission diagnosis:  Confusion [R41.0] Encephalopathy acute [G93.40] Suspected COVID-19 virus infection [Z20.822] Patient Active Problem List   Diagnosis Date Noted  . Encephalopathy acute 02/17/2020  . Healthcare maintenance 04/09/2019  . Abnormal findings on diagnostic imaging of lung 04/08/2019  . History of lumbar spinal fusion 03/14/2019  . Trochanteric bursitis, right hip 03/14/2019  . Spondylolisthesis of lumbar region 05/23/2017  . Acute kidney injury superimposed on chronic kidney disease ([place]) 02/04/2016  . Chest pain with moderate risk of acute coronary syndrome 02/04/2016  . Tobacco use 02/04/2016  . Hypokalemia 02/04/2016  . Murmur, cardiac-suspect this is AS 02/04/2016  . Hyperlipidemia   . GERD (gastroesophageal reflux disease)   . HTN (hypertension)   . Hypothyroid   . Chest pain   . COPD with +BD  11/16/2010  . Dyspnea 10/22/2010  . Smoking 10/22/2010   PCP:  Merrilee Seashore, MD Pharmacy:   [facility] [address], [place] [address] Phone: [phone] Fax: [phone]  [id number] - [place], [place] - [address] [place] [address] [place]  [address] Phone: [phone] Fax: [phone]     Social Determinants of Health (SDOH) Interventions    Readmission Risk Interventions No flowsheet data found.

## 2020-02-20 NOTE — [place] FL2 (Signed)
[place] MEDICAID FL2 LEVEL OF CARE SCREENING TOOL     IDENTIFICATION  Patient Name: Joann Barnes Birthdate: 11/23/1940 Sex: female Admission Date (Current Location): 02/17/2020  [facility] and [place] Number:  Herbalist and Address:  The [place], [place] [address] [place],  [address]      Provider Number: [id number]  Attending Physician Name and Address:  Edwin Dada, *  Relative Name and Phone Number:       Current Level of Care: Hospital Recommended Level of Care: [place] Prior Approval Number:    Date Approved/Denied:   PASRR Number: [id number]  Discharge Plan: SNF    Current Diagnoses: Patient Active Problem List   Diagnosis Date Noted  . Encephalopathy acute 02/17/2020  . Healthcare maintenance 04/09/2019  . Abnormal findings on diagnostic imaging of lung 04/08/2019  . History of lumbar spinal fusion 03/14/2019  . Trochanteric bursitis, right hip 03/14/2019  . Spondylolisthesis of lumbar region 05/23/2017  . Acute kidney injury superimposed on chronic kidney disease ([place]) 02/04/2016  . Chest pain with moderate risk of acute coronary syndrome 02/04/2016  . Tobacco use 02/04/2016  . Hypokalemia 02/04/2016  . Murmur, cardiac-suspect this is AS 02/04/2016  . Hyperlipidemia   . GERD (gastroesophageal reflux disease)   . HTN (hypertension)   . Hypothyroid   . Chest pain   . COPD with +BD  11/16/2010  . Dyspnea 10/22/2010  . Smoking 10/22/2010    Orientation RESPIRATION BLADDER Height & Weight     Self, Place  Normal Incontinent Weight:   Height:     BEHAVIORAL SYMPTOMS/MOOD NEUROLOGICAL BOWEL NUTRITION STATUS      Incontinent Diet (See DC Summary)  AMBULATORY STATUS COMMUNICATION OF NEEDS Skin   Extensive Assist Verbally Normal                       Personal Care Assistance Level of Assistance  Bathing, Feeding, Dressing Bathing Assistance: Maximum assistance Feeding  assistance: Limited assistance Dressing Assistance: Maximum assistance     Functional Limitations Info             SPECIAL CARE FACTORS FREQUENCY  PT (By licensed PT), OT (By licensed OT)     PT Frequency: 5xweek OT Frequency: 5xweek            Contractures Contractures Info: Not present    Additional Factors Info  Code Status, Allergies Code Status Info: Full Allergies Info: Bactrim (Sulfamethoxazole-trimethoprim), Nicotine, Codeine, Lipitor (Atorvastatin), Benadryl (Diphenhydramine Hcl), Chantix (Varenicline Tartrate), Chantix (Varenicline), Wellbutrin (Bupropion), Zolpidem Tartrate           Current Medications (02/20/2020):  This is the current hospital active medication list Current Facility-Administered Medications  Medication Dose Route Frequency Provider Last Rate Last Admin  . acetaminophen (TYLENOL) tablet 650 mg  650 mg Oral Q6H PRN Gilles Chiquito B, MD   650 mg at 02/20/20 0800   Or  . acetaminophen (TYLENOL) suppository 650 mg  650 mg Rectal Q6H PRN Gilles Chiquito B, MD      . albuterol (PROVENTIL) (2.5 MG/3ML) 0.083% nebulizer solution 2.5 mg  2.5 mg Nebulization Q6H PRN Gilles Chiquito B, MD      . amLODipine (NORVASC) tablet 5 mg  5 mg Oral Daily Gilles Chiquito B, MD   5 mg at 02/19/20 1013  . aspirin EC tablet 81 mg  81 mg Oral Daily Edwin Dada, MD   81 mg at 02/19/20 1331  .  azithromycin (ZITHROMAX) tablet 500 mg  500 mg Oral Daily Danford, Suann Larry, MD       Followed by  . [START ON 02/21/2020] azithromycin (ZITHROMAX) tablet 250 mg  250 mg Oral Daily Danford, Christopher P, MD      . enoxaparin (LOVENOX) injection 40 mg  40 mg Subcutaneous Q24H Gilles Chiquito B, MD   40 mg at 02/19/20 2155  . fluticasone furoate-vilanterol (BREO ELLIPTA) 100-25 MCG/INH 1 puff  1 puff Inhalation Daily Sid Falcon, MD   1 puff at 02/20/20 0802  . irbesartan (AVAPRO) tablet 37.5 mg  37.5 mg Oral Daily Danford, Suann Larry, MD   37.5 mg at 02/19/20 1331   . levothyroxine (SYNTHROID) tablet 25 mcg  25 mcg Oral Daily Edwin Dada, MD   25 mcg at 02/19/20 1013  . ondansetron (ZOFRAN) tablet 4 mg  4 mg Oral Q6H PRN Sid Falcon, MD       Or  . ondansetron [facility]) injection 4 mg  4 mg Intravenous Q6H PRN Gilles Chiquito B, MD      . oxyCODONE (Oxy IR/ROXICODONE) immediate release tablet 2.5 mg  2.5 mg Oral Once Danford, Suann Larry, MD      . pravastatin (PRAVACHOL) tablet 80 mg  80 mg Oral QHS Edwin Dada, MD   80 mg at 02/19/20 2155  . rOPINIRole (REQUIP) tablet 0.25 mg  0.25 mg Oral Once Danford, Suann Larry, MD      . sodium chloride flush (NS) 0.9 % injection 3 mL  3 mL Intravenous Q12H Sid Falcon, MD   3 mL at 02/18/20 2311     Discharge Medications: Please see discharge summary for a list of discharge medications.  Relevant Imaging Results:  Relevant Lab Results:   Additional Information SS# [id number]  Geralynn Ochs, LCSW

## 2020-02-20 NOTE — Progress Notes (Signed)
[facility] PROGRESS NOTE    MAIRA CHRISTON  ONG:[id number] DOB: [birth year] DOA: 02/17/2020 PCP: Merrilee Seashore, MD      Brief Narrative:  Mrs. Kooy is a 79 y.o. F with COPD not on O2, HTN, hypothyroidism, and smoking who presented with confusion.  Evidently, patient exposed to her sister 2 weeks ago who had COVID.  Developed URI symptoms, received monoclonal Abs/REGEN-COV despite no positive COVID test.  Continued to have dyspnea on exertion, seen in the ER 1 week prior to admission, prescribed prednisone burst which she completed.  Evidently, patient lives alone and was oriented the day prior to admission. The day of admission, patient was confused, family brought her to the ER.  In the ER, CT head normal.  CXR clear and UA without inflammation.  COVID negative, procal undetectable.  UDS positive for opiates and benzodiazepines.  WBC 17K, electrolytes normal, renal function at baseline.  She was started on broad spectrum antibiotics and the hospitalists were consulted for work up.       Assessment & Plan:  Acute metabolic encephalopathy Patient admitted and started on IV fluids and telemetry.  UDS showed benzodiazepines and opiates detected (review of PDMP shows she had a recent new Norco rx, but also old Valium and Tramadol scripts).  Was also recently on steroids 50 mg pred for 5 days.  Lastly, she has amitriptyline, and some of her symptoms of tachycardia, confusion, xerostomia and mydriasis are consistent with a anti-cholinergic/TCA toxidrome.  Possible pneumonia on CT chest, it may be that infection is contributing, which we are treating.  Stroke ruled out with MRI.  Status epilepticus ruled out.  TSH and RPR normal.  B12 pending.  No symptoms to suggest CNS infection.  Improved orientation today, but still somewhat disoriented, impulsive and weak.  -Continue to hold all CNS active meds  -PT eval   Possible right upper lobe community acquired  pneumonia CT obtained yesterday due to dyspnea and elevated D-dimer.  This ruled out PE but did show some mild patchy RUL pneumonia. -Start Zpak -Follow up x-ray in 4-6 weeks  Hypertension BP improved -Continue amlodipine and ARB   Hypothyroidism TSH normal -Contineu levothyroxine  COPD without exacerbation No acute disease -Continue ICS LABA -Hold LAMA  CKD IIIa Cr stable relative to baseline  Hyponatremia Resolved  Anemia, unknown cause, microcytic Iron studies normal -Repeat CBC and ferritin in 6 weeks  Anisocoria From Chart review, this is old      Disposition: Status is: Inpatient  Remains inpatient appropriate because:Altered mental status   Dispo: The patient is from: Home              Anticipated d/c is to: TBD              Anticipated d/c date is: 3 days              Patient currently is not medically stable to d/c.     Patient admitted with altered mental status. She is marginally improved, but at baseline lives with her 59 year old sister and is fully oriented and independent, but currently is still confused and weak.    She will require several weeks of rehab likely before she can return to her prior independent level of function and a safe discharge plan is being sought.              MDM: The below labs and imaging reports were reviewed and summarized above.  Medication management as above.  DVT prophylaxis: enoxaparin (LOVENOX) injection 40 mg Start: 02/17/20 2200  Code Status: FULL Family Communication: Sister by phone    Consultants:   none  Procedures:   CT head unremarkable  MRI brain unremarkable  EEG unremarkable  Antimicrobials:   Cefepime, Flagyl, vancomycin once10/17  Culture data:   10/17 blood culture  No growth  10/17 urine culture-insignificant growth          Subjective: Patient is feeling well.  She still somewhat weak, but she is oriented to person, place, and month.  No fever.   She has some cough but no sputum, no chest pain, no abdominal symptoms, no urinary symptoms.       Objective: Vitals:   02/20/20 0306 02/20/20 0740 02/20/20 0802 02/20/20 1129  BP: (!) 141/61 (!) 157/52  (!) 163/73  Pulse: 86 85 (!) 106 81  Resp: 18   20  Temp: 98.2 F (36.8 C) 97.9 F (36.6 C)  97.6 F (36.4 C)  TempSrc: Oral Oral  Oral  SpO2: 99% 99% 95% 98%    Intake/Output Summary (Last 24 hours) at 02/20/2020 1524 Last data filed at 02/20/2020 1300 Gross per 24 hour  Intake 738.2 ml  Output --  Net 738.2 ml   There were no vitals filed for this visit.  Examination: General appearance: Elderly female, lying in bed, no acute distress, able to transfer to the side of the bed by herself.     HEENT: Anicteric, conjunctive are pink, lids and lashes normal.  Normal nasal mucosa, without discharge or epistaxis.  Lips dry, oropharynx, moist, no oral lesions, hearing normal Skin: No suspicious rashes or lesions, scab on the right inner elbow, old. Cardiac: Heart rate regular, normal rhythm, no murmurs, JVP not visible, no lower extremity edema Respiratory: Lungs clear without rales or wheezes.  Respiratory effort normal. Abdomen: Abdomen soft without tenderness palpation or guarding. MSK: Normal muscle bulk and tone for age, Neuro: Extraocular movements intact, face symmetric, speech fluent, oriented to self, place, month, president, situation, events of today and yesterday.  Strength symmetric in bilateral upper and lower extremities.  Speech fluent. Psych: Attention improved, but still slightly tangential, and impulsive.        Data Reviewed: I have personally reviewed following labs and imaging studies:  CBC: Recent Labs  Lab 02/17/20 1611 02/17/20 1611 02/17/20 1900 02/17/20 2048 02/18/20 0516 02/19/20 0615 02/20/20 0743  WBC 17.1*  --   --  16.4* 14.3* 12.3* 11.1*  NEUTROABS 13.8*  --   --   --   --   --   --   HGB 10.9*   < > 10.9* 10.4* 10.2* 9.7* 9.1*   HCT 34.1*   < > 32.0* 32.6* 32.0* 29.6* 29.1*  MCV 82.8  --   --  82.3 81.0 80.7 82.2  PLT 510*  --   --  476* 443* 482* 515*   < > = values in this interval not displayed.   Basic Metabolic Panel: Recent Labs  Lab 02/17/20 1611 02/17/20 1900 02/17/20 2048 02/18/20 0516 02/19/20 0615 02/20/20 0743  NA 136 135  --  134* 135 139  K 4.0 4.1  --  3.8 3.9 3.8  CL 100  --   --  101 102 106  CO2 25  --   --  23 24 21*  GLUCOSE 109*  --   --  93 88 69*  BUN 16  --   --  12 16 15   CREATININE 1.18*  --  1.05* 0.97 0.99 1.06*  CALCIUM 8.6*  --   --  8.4* 8.5* 8.1*   GFR: CrCl cannot be calculated (Unknown ideal weight.). Liver Function Tests: Recent Labs  Lab 02/17/20 1611 02/18/20 0516 02/19/20 0615  AST 16 14* 15  ALT 23 19 19   ALKPHOS 108 95 97  BILITOT 0.8 0.9 0.8  PROT 6.2* 5.4* 5.5*  ALBUMIN 3.0* 2.5* 2.6*   Recent Labs  Lab 02/17/20 1611  LIPASE 20   Recent Labs  Lab 02/19/20 0615  AMMONIA 20   Coagulation Profile: No results for input(s): INR, PROTIME in the last 168 hours. Cardiac Enzymes: No results for input(s): CKTOTAL, CKMB, CKMBINDEX, TROPONINI in the last 168 hours. BNP (last 3 results) No results for input(s): PROBNP in the last 8760 hours. HbA1C: No results for input(s): HGBA1C in the last 72 hours. CBG: Recent Labs  Lab 02/18/20 1101 02/19/20 0604  GLUCAP 108* 78   Lipid Profile: Recent Labs    02/17/20 1611  TRIG 166*   Thyroid Function Tests: Recent Labs    02/17/20 2048  TSH 1.722   Anemia Panel: Recent Labs    02/17/20 1611 02/20/20 0557  VITAMINB12  --  188  FERRITIN 68  --   TIBC  --  276  IRON  --  37   Urine analysis:    Component Value Date/Time   COLORURINE YELLOW 02/17/2020 2032   APPEARANCEUR CLEAR 02/17/2020 2032   LABSPEC 1.014 02/17/2020 2032   PHURINE 7.0 02/17/2020 2032   GLUCOSEU NEGATIVE 02/17/2020 2032   HGBUR NEGATIVE 02/17/2020 2032   [place] NEGATIVE 02/17/2020 2032   KETONESUR 5 (A)  02/17/2020 2032   PROTEINUR NEGATIVE 02/17/2020 2032   UROBILINOGEN 1.0 03/29/2007 1449   NITRITE NEGATIVE 02/17/2020 2032   LEUKOCYTESUR NEGATIVE 02/17/2020 2032   Sepsis Labs: @LABRCNTIP (procalcitonin:4,lacticacidven:4)  ) Recent Results (from the past 240 hour(s))  Respiratory Panel by RT PCR (Flu A&B, Covid) - Nasopharyngeal Swab     Status: None   Collection Time: 02/17/20  3:33 PM   Specimen: Nasopharyngeal Swab  Result Value Ref Range Status   SARS Coronavirus 2 by RT PCR NEGATIVE NEGATIVE Final    Comment: (NOTE) SARS-CoV-2 target nucleic acids are NOT DETECTED.  The SARS-CoV-2 RNA is generally detectable in upper respiratoy specimens during the acute phase of infection. The lowest concentration of SARS-CoV-2 viral copies this assay can detect is 131 copies/mL. A negative result does not preclude SARS-Cov-2 infection and should not be used as the sole basis for treatment or other patient management decisions. A negative result may occur with  improper specimen collection/handling, submission of specimen other than nasopharyngeal swab, presence of viral mutation(s) within the areas targeted by this assay, and inadequate number of viral copies (<131 copies/mL). A negative result must be combined with clinical observations, patient history, and epidemiological information. The expected result is Negative.  Fact Sheet for Patients:  PinkCheek.be  Fact Sheet for Healthcare Providers:  GravelBags.it  This test is no t yet approved or cleared by the [place] FDA and  has been authorized for detection and/or diagnosis of SARS-CoV-2 by FDA under an Emergency Use Authorization (EUA). This EUA will remain  in effect (meaning this test can be used) for the duration of the COVID-19 declaration under Section 564(b)(1) of the Act, 21 U.S.C. section 360bbb-3(b)(1), unless the authorization is terminated or revoked  sooner.     Influenza A by PCR NEGATIVE NEGATIVE Final   Influenza B by PCR NEGATIVE NEGATIVE  Final    Comment: (NOTE) The Xpert Xpress SARS-CoV-2/FLU/RSV assay is intended as an aid in  the diagnosis of influenza from Nasopharyngeal swab specimens and  should not be used as a sole basis for treatment. Nasal washings and  aspirates are unacceptable for Xpert Xpress SARS-CoV-2/FLU/RSV  testing.  Fact Sheet for Patients: PinkCheek.be  Fact Sheet for Healthcare Providers: GravelBags.it  This test is not yet approved or cleared by the [place] FDA and  has been authorized for detection and/or diagnosis of SARS-CoV-2 by  FDA under an Emergency Use Authorization (EUA). This EUA will remain  in effect (meaning this test can be used) for the duration of the  Covid-19 declaration under Section 564(b)(1) of the Act, 21  U.S.C. section 360bbb-3(b)(1), unless the authorization is  terminated or revoked. Performed at [facility], [place] [address], [place], [place] [address]   Blood Culture (routine x 2)     Status: None (Preliminary result)   Collection Time: 02/17/20  3:34 PM   Specimen: BLOOD LEFT HAND  Result Value Ref Range Status   Specimen Description BLOOD LEFT HAND  Final   Special Requests   Final    BOTTLES DRAWN AEROBIC AND ANAEROBIC Blood Culture adequate volume   Culture   Final    NO GROWTH 3 DAYS Performed at [facility], [place] [address] [place], [place] [address]    Report Status PENDING  Incomplete  Blood Culture (routine x 2)     Status: None (Preliminary result)   Collection Time: 02/17/20  4:11 PM   Specimen: BLOOD  Result Value Ref Range Status   Specimen Description BLOOD RIGHT ANTECUBITAL  Final   Special Requests   Final    BOTTLES DRAWN AEROBIC AND ANAEROBIC Blood Culture results may not be optimal due to an excessive volume of blood received in culture bottles   Culture   Final    NO  GROWTH 3 DAYS Performed at [facility], [place] [address] [place], [place] [address]    Report Status PENDING  Incomplete  Urine culture     Status: Abnormal   Collection Time: 02/17/20  8:13 PM   Specimen: Urine, Clean Catch  Result Value Ref Range Status   Specimen Description URINE, CLEAN CATCH  Final   Special Requests NONE  Final   Culture (A)  Final    <10,000 COLONIES/mL INSIGNIFICANT GROWTH Performed at [facility], [place] [address], [place], [place] [address]    Report Status 02/19/2020 FINAL  Final  MRSA PCR Screening     Status: None   Collection Time: 02/19/20  7:11 PM   Specimen: Nasopharyngeal  Result Value Ref Range Status   MRSA by PCR NEGATIVE NEGATIVE Final    Comment:        The GeneXpert MRSA Assay (FDA approved for NASAL specimens only), is one component of a comprehensive MRSA colonization surveillance program. It is not intended to diagnose MRSA infection nor to guide or monitor treatment for MRSA infections. Performed at [facility], [place] [address] [place], [place] [address]          Radiology Studies: EEG  Result Date: 02/18/2020 Lora Havens, MD     02/18/2020  4:29 PM Patient Name: DANALEE FLATH MRN: [id number] Epilepsy Attending: Lora Havens Referring Physician/Provider: Dr Myrene Buddy Date: 02/17/2020 Duration: 23.48 mins Patient history: 79yo F with ams. EEG to evaluate for seizure. Level of alertness: Awake AEDs  during EEG study: None Technical aspects: This EEG study was done with scalp electrodes positioned according to the 10-20 International system of electrode placement. Electrical activity was acquired at a sampling rate of 500Hz  and reviewed with a high frequency filter of 70Hz  and a low frequency filter of 1Hz . EEG data were recorded continuously and digitally stored. Description: No posterior dominant rhythm was seen.  EEG showed continuous generalized 3 to 6 Hz theta-delta slowing.  Hyperventilation  and photic stimulation were not performed.   EEG was technically difficult due to significant eye flutter artifact was noted. ABNORMALITY -Continuous slow, generalized IMPRESSION: This study is suggestive of mild to moderate diffuse encephalopathy, nonspecific etiology. No seizures or epileptiform discharges were seen throughout the recording. Priyanka Barbra Sarks   CT ANGIO CHEST PE W OR WO CONTRAST  Result Date: 02/19/2020 CLINICAL DATA:  79 year old female with positive D-dimer. Concern for pulmonary embolism. EXAM: CT ANGIOGRAPHY CHEST WITH CONTRAST TECHNIQUE: Multidetector CT imaging of the chest was performed using the standard protocol during bolus administration of intravenous contrast. Multiplanar CT image reconstructions and MIPs were obtained to evaluate the vascular anatomy. CONTRAST:  72mL OMNIPAQUE IOHEXOL 350 MG/ML SOLN COMPARISON:  Chest radiograph dated 02/17/2020 and CT dated 04/06/2019. FINDINGS: Cardiovascular: There is no cardiomegaly or pericardial effusion. There is coronary vascular calcification. There is advanced atherosclerotic calcification of the thoracic aorta. No aneurysmal dilatation. No CT evidence of pulmonary embolism. Mediastinum/Nodes: Top-normal right hilar lymph nodes measure 9 mm in short axis. No adenopathy. There is a small hiatal hernia. The esophagus is grossly unremarkable. No mediastinal fluid collection. Lungs/Pleura: There is a cluster of nodular density in the right upper lobe, new since the prior CT and concerning for an inflammatory/infectious etiology. Clinical correlation and follow-up to resolution recommended. Additional faint scattered subpleural hazy densities noted which may represent atelectasis or atypical infiltrate. There are bibasilar linear atelectasis/scarring. No lobar consolidation, pleural effusion, pneumothorax. The central airways are patent. Upper Abdomen: Mild bilateral adrenal thickening. Musculoskeletal: Degenerative changes of the spine. No  acute osseous pathology. Review of the MIP images confirms the above findings. IMPRESSION: 1. No CT evidence of pulmonary embolism. 2. Cluster of nodular density in the right upper lobe concerning for an inflammatory/infectious etiology. Clinical correlation and follow-up to resolution recommended. 3. Aortic Atherosclerosis (ICD10-I70.0). Electronically Signed   By: Anner Crete M.D.   On: 02/19/2020 22:49        Scheduled Meds: . amLODipine  5 mg Oral Daily  . aspirin EC  81 mg Oral Daily  . [START ON 02/21/2020] azithromycin  250 mg Oral Daily  . enoxaparin (LOVENOX) injection  40 mg Subcutaneous Q24H  . fluticasone furoate-vilanterol  1 puff Inhalation Daily  . irbesartan  37.5 mg Oral Daily  . levothyroxine  25 mcg Oral Daily  . oxyCODONE  2.5 mg Oral Once  . pravastatin  80 mg Oral QHS  . sodium chloride flush  3 mL Intravenous Q12H   Continuous Infusions:    LOS: 3 days    Time spent: 25 minutes    Edwin Dada, MD [facility] 02/20/2020, 3:24 PM     Please page though [place] or Epic secure chat:  For [employer], Adult nurse

## 2020-02-20 NOTE — Evaluation (Signed)
Occupational Therapy Evaluation Patient Details Name: Joann Barnes MRN: [id number] DOB: 1941/03/15 Today's Date: 02/20/2020    History of Present Illness Joann Barnes is a 79 y.o. F with COPD not on O2, HTN, hypothyroidism, PLIF 2019, and smoking who presented with confusion thought to be due to polypharmacy. Of note, pt exposed to sister who had covid and received monoclonal Abs/REGEN-COV. Covid test neg upon admission    Clinical Impression   PT admitted with AMS. Pt currently with functional limitiations due to the deficits listed below (see OT problem list). Pt currently requires mod (A) for basic transfer and cues for safety with RW. Pt high fall risk and recommendation for rehab prior to d/c home. Spoke with sister Joann Barnes on the phone regarding d/c recommendation and agreed SNF for patient prior to d/c home. Sister reports she si the only one that can help patient during the day and that the niece next door works during the day. Pt agreeable to SNF with further discussion with sister Joann Barnes. Sister interested in SNF near [place] so that she can drive to visit sister.  Pt will benefit from skilled OT to increase their independence and safety with adls and balance to allow discharge SNF.     Follow Up Recommendations  SNF    Equipment Recommendations  None recommended by OT    Recommendations for Other Services       Precautions / Restrictions Precautions Precautions: Fall Precaution Comments: falling at a wedding prior to admission      Mobility Bed Mobility Overal bed mobility: Needs Assistance Bed Mobility: Supine to Sit     Supine to sit: Min assist     General bed mobility comments: using bed rail and reaching out to therapist to elevate trunk from bed surface    Transfers Overall transfer level: Needs assistance Equipment used: Rolling walker (2 wheeled) Transfers: Sit to/from Stand Sit to Stand: Mod assist         General transfer comment: (A) to  power up from surface and stabilize static standing    Balance Overall balance assessment: Needs assistance Sitting-balance support: Bilateral upper extremity supported;Feet supported Sitting balance-Leahy Scale: Fair     Standing balance support: Bilateral upper extremity supported;During functional activity Standing balance-Leahy Scale: Poor Standing balance comment: reliant on bil UE support                           ADL either performed or assessed with clinical judgement   ADL Overall ADL's : Needs assistance/impaired Eating/Feeding: Set up;Sitting Eating/Feeding Details (indicate cue type and reason): pt placing pineapple in mouth attempting to chew and then says "oh i forgot i dont have teeth" Pt without denture present and forgot Grooming: Wash/dry hands;Standing Grooming Details (indicate cue type and reason): pt requires min (A) to sustain static standing at sink for task. pt with LOB and unable to correct.             Lower Body Dressing: Minimal assistance;Supervision/safety Lower Body Dressing Details (indicate cue type and reason): pt requires supine positioning due to poor sitting balance at EOB. pt able to do supine with figure 4 Toilet Transfer: Moderate assistance;RW;BSC Toilet Transfer Details (indicate cue type and reason): pt with unsteady gait to [facility] and reports "i am not going to fall" but requires (A) to sustain static standing balance Toileting- Clothing Manipulation and Hygiene: Maximal assistance       Functional mobility during ADLs: Moderate assistance;Rolling walker  General ADL Comments: pt lacks awareness to fall risk     Vision         Perception     Praxis      Pertinent Vitals/Pain Pain Assessment: Faces Faces Pain Scale: Hurts even more Pain Location: R hip Pain Descriptors / Indicators: Discomfort;Grimacing;Guarding Pain Intervention(s): Monitored during session;Premedicated before session;Repositioned;Limited activity  within patient's tolerance     Hand Dominance Right   Extremity/Trunk Assessment Upper Extremity Assessment Upper Extremity Assessment: RUE deficits/detail RUE Deficits / Details: noted to have large wound and pt pulling at the wound opening it and causing bleeding. OT dressing site and making RN aware.        Cervical / Trunk Assessment Cervical / Trunk Assessment: Other exceptions Cervical / Trunk Exceptions: h/o PLIF 2019- pending appointment and procedure with Dr Arnoldo Morale per patient   Communication Communication Communication: No difficulties   Cognition Arousal/Alertness: Awake/alert Behavior During Therapy: Restless;Impulsive Overall Cognitive Status: Impaired/Different from baseline Area of Impairment: Memory;Safety/judgement                 Orientation Level: Situation Current Attention Level: Selective Memory: Decreased short-term memory   Safety/Judgement: Decreased awareness of deficits;Decreased awareness of safety Awareness: Intellectual   General Comments: Pt reports correct location date and verbalized that she needs to call her sister so that she can go home today. pt able to recall information from todays stay however lacks awareness to deficits. pt telling sister on the phone "Joann Barnes i am not going ot fall but its not a big deal we have called the [place] fire department to get peopel up its not that big of a deal" Pt verbalized "i have to get my legs stronger" but then verbalizes wanting to go home today   General Comments       Exercises     Shoulder Instructions      Home Living Family/patient expects to be discharged to:: Skilled nursing facility Living Arrangements: Other relatives (sister Joann Barnes)   Type of Home: House Home Access: Stairs to enter [employer] of Steps: [place]: One level     Bathroom Shower/Tub: Teacher, early years/pre: Standard     Home Equipment: Environmental consultant - 4 wheels    Additional Comments: obtained from patient and sister / PT evaluation      Prior Functioning/Environment Level of Independence: Independent                 OT Problem List: Decreased strength;Decreased activity tolerance;Impaired balance (sitting and/or standing);Decreased safety awareness;Decreased knowledge of use of DME or AE;Decreased knowledge of precautions;Pain;Obesity;Decreased cognition;Decreased coordination      OT Treatment/Interventions: Self-care/ADL training;Therapeutic exercise;Energy conservation;DME and/or AE instruction;Therapeutic activities;Cognitive remediation/compensation;Patient/family education;Balance training    OT Goals(Current goals can be found in the care plan section) Acute Rehab OT Goals Patient Stated Goal: to go home OT Goal Formulation: With patient/family Time For Goal Achievement: 03/05/20 Potential to Achieve Goals: Good  OT Frequency: Min 2X/week   Barriers to D/C:            Co-evaluation              AM-PAC OT "6 Clicks" Daily Activity     Outcome Measure Help from another person eating meals?: A Little Help from another person taking care of personal grooming?: A Little Help from another person toileting, which includes using toliet, bedpan, or urinal?: A Lot Help from another person bathing (including washing, rinsing, drying)?: A Lot Help from  another person to put on and taking off regular upper body clothing?: A Little Help from another person to put on and taking off regular lower body clothing?: A Little 6 Click Score: 16   End of Session Equipment Utilized During Treatment: Gait belt;Rolling walker Nurse Communication: Mobility status;Precautions  Activity Tolerance: Patient tolerated treatment well Patient left: in chair;with call bell/phone within reach;with chair alarm set;with nursing/sitter in room (RN in room placing new bed- advised to tranfer to bed after)  OT Visit Diagnosis: Unsteadiness on feet  (R26.81);Muscle weakness (generalized) (M62.81)                Time: 4718-5501 OT Time Calculation (min): 38 min Charges:  OT General Charges $OT Visit: 1 Visit OT Evaluation $OT Eval Moderate Complexity: 1 Mod OT Treatments $Self Care/Home Management : 8-22 mins      Jeri Modena 02/20/2020, 1:47 PM

## 2020-02-20 NOTE — Progress Notes (Signed)
Physical Therapy Treatment Patient Details Name: Joann Barnes MRN: [id number] DOB: 07-12-1940 Today's Date: 02/20/2020    History of Present Illness Joann Barnes is a 79 y.o. F with COPD not on O2, HTN, hypothyroidism, PLIF 2019, and smoking who presented with confusion thought to be due to polypharmacy. Of note, pt exposed to sister who had covid and received monoclonal Abs/REGEN-COV. Covid test neg upon admission     PT Comments    Pt tolerates treatment well but continues to demonstrate significant lack of awareness into safety and her functional deficits. Pt does report she feels the need for rehab at the time of discharge, however remains impulsive during session, quickly forgetting PTs cues at times and returning to supine after transitioning to sitting multiple times. Pt continues to require physical assistance to maintain safety and balance. PT continues to recommend SNF placement as the pt has a history of falls and remains at a high falls risk at this time.   Follow Up Recommendations  SNF;Supervision/Assistance - 24 hour     Equipment Recommendations   (defer to post-acute setting)    Recommendations for Other Services       Precautions / Restrictions Precautions Precautions: Fall Precaution Comments: falling at a wedding prior to admission Restrictions Weight Bearing Restrictions: No    Mobility  Bed Mobility Overal bed mobility: Needs Assistance Bed Mobility: Supine to Sit;Sit to Supine     Supine to sit: Supervision Sit to supine: Supervision   General bed mobility comments: pt impulsively returns to supine twice during session prior to standing  Transfers Overall transfer level: Needs assistance Equipment used: Rolling walker (2 wheeled) Transfers: Sit to/from Stand Sit to Stand: Min guard         General transfer comment: (A) to power up from surface and stabilize static standing  Ambulation/Gait Ambulation/Gait assistance: Min assist Gait Distance  (Feet): 80 Feet Assistive device: Rolling walker (2 wheeled) Gait Pattern/deviations: Step-to pattern Gait velocity: reduced Gait velocity interpretation: <1.8 ft/sec, indicate of risk for recurrent falls General Gait Details: pt with short step to gait, increased lateral sway and path deviation   Stairs             Wheelchair Mobility    Modified Rankin (Stroke Patients Only)       Balance Overall balance assessment: Needs assistance Sitting-balance support: No upper extremity supported;Feet supported Sitting balance-Leahy Scale: Good Sitting balance - Comments: close supervision   Standing balance support: Single extremity supported;Bilateral upper extremity supported Standing balance-Leahy Scale: Poor Standing balance comment: reliant on UE support of RW                            Cognition Arousal/Alertness: Awake/alert Behavior During Therapy: Impulsive Overall Cognitive Status: Impaired/Different from baseline Area of Impairment: Memory;Safety/judgement                 Orientation Level: Situation Current Attention Level: Selective Memory: Decreased recall of precautions;Decreased short-term memory   Safety/Judgement: Decreased awareness of deficits;Decreased awareness of safety Awareness: Intellectual   General Comments: Pt reports correct location date and verbalized that she needs to call her sister so that she can go home today. pt able to recall information from todays stay however lacks awareness to deficits. pt telling sister on the phone "Joann Barnes i am not going ot fall but its not a big deal we have called the [place] fire department to get peopel up its not that big of  a deal" Pt verbalized "i have to get my legs stronger" but then verbalizes wanting to go home today      Exercises      General Comments General comments (skin integrity, edema, etc.): VSS on RA      Pertinent Vitals/Pain Pain Assessment: No/denies  pain Faces Pain Scale: Hurts even more Pain Location: R hip Pain Descriptors / Indicators: Discomfort;Grimacing;Guarding Pain Intervention(s): Monitored during session;Premedicated before session;Repositioned;Limited activity within patient's tolerance    Home Living Family/patient expects to be discharged to:: Skilled nursing facility Living Arrangements: Other relatives (sister Joann Barnes)   Type of Home: House Home Access: Stairs to enter   Home Layout: One level Home Equipment: Environmental consultant - 4 wheels Additional Comments: obtained from patient and sister / PT evaluation    Prior Function Level of Independence: Independent          PT Goals (current goals can now be found in the care plan section) Acute Rehab PT Goals Patient Stated Goal: to go to rehab and get stronger Progress towards PT goals: Progressing toward goals    Frequency    Min 3X/week      PT Plan Current plan remains appropriate    Co-evaluation              AM-PAC PT "6 Clicks" Mobility   Outcome Measure  Help needed turning from your back to your side while in a flat bed without using bedrails?: None Help needed moving from lying on your back to sitting on the side of a flat bed without using bedrails?: None Help needed moving to and from a bed to a chair (including a wheelchair)?: A Little Help needed standing up from a chair using your arms (e.g., wheelchair or bedside chair)?: A Little Help needed to walk in hospital room?: A Little Help needed climbing 3-5 steps with a railing? : Total 6 Click Score: 18    End of Session   Activity Tolerance: Patient tolerated treatment well Patient left: in bed;with call bell/phone within reach;with bed alarm set Nurse Communication: Mobility status PT Visit Diagnosis: Muscle weakness (generalized) (M62.81);Difficulty in walking, not elsewhere classified (R26.2)     Time: 1410-3013 PT Time Calculation (min) (ACUTE ONLY): 17 min  Charges:  $Gait  Training: 8-22 mins                     Zenaida Niece, PT, DPT Acute Rehabilitation Pager: [phone]    Zenaida Niece 02/20/2020, 2:17 PM

## 2020-02-21 DIAGNOSIS — M25559 Pain in unspecified hip: Secondary | ICD-10-CM | POA: Diagnosis not present

## 2020-02-21 DIAGNOSIS — M6281 Muscle weakness (generalized): Secondary | ICD-10-CM | POA: Diagnosis not present

## 2020-02-21 DIAGNOSIS — R058 Other specified cough: Secondary | ICD-10-CM | POA: Diagnosis not present

## 2020-02-21 DIAGNOSIS — M4316 Spondylolisthesis, lumbar region: Secondary | ICD-10-CM | POA: Diagnosis not present

## 2020-02-21 DIAGNOSIS — J449 Chronic obstructive pulmonary disease, unspecified: Secondary | ICD-10-CM | POA: Diagnosis not present

## 2020-02-21 DIAGNOSIS — K219 Gastro-esophageal reflux disease without esophagitis: Secondary | ICD-10-CM | POA: Diagnosis not present

## 2020-02-21 DIAGNOSIS — Z8616 Personal history of COVID-19: Secondary | ICD-10-CM | POA: Diagnosis not present

## 2020-02-21 DIAGNOSIS — I10 Essential (primary) hypertension: Secondary | ICD-10-CM | POA: Diagnosis not present

## 2020-02-21 DIAGNOSIS — J189 Pneumonia, unspecified organism: Secondary | ICD-10-CM | POA: Diagnosis not present

## 2020-02-21 DIAGNOSIS — Z981 Arthrodesis status: Secondary | ICD-10-CM | POA: Diagnosis not present

## 2020-02-21 DIAGNOSIS — E785 Hyperlipidemia, unspecified: Secondary | ICD-10-CM | POA: Diagnosis not present

## 2020-02-21 DIAGNOSIS — R5381 Other malaise: Secondary | ICD-10-CM | POA: Diagnosis not present

## 2020-02-21 DIAGNOSIS — R262 Difficulty in walking, not elsewhere classified: Secondary | ICD-10-CM | POA: Diagnosis not present

## 2020-02-21 DIAGNOSIS — G9340 Encephalopathy, unspecified: Secondary | ICD-10-CM | POA: Diagnosis not present

## 2020-02-21 DIAGNOSIS — N1831 Chronic kidney disease, stage 3a: Secondary | ICD-10-CM | POA: Diagnosis not present

## 2020-02-21 DIAGNOSIS — R0989 Other specified symptoms and signs involving the circulatory and respiratory systems: Secondary | ICD-10-CM | POA: Diagnosis not present

## 2020-02-21 DIAGNOSIS — Z720 Tobacco use: Secondary | ICD-10-CM | POA: Diagnosis not present

## 2020-02-21 DIAGNOSIS — E039 Hypothyroidism, unspecified: Secondary | ICD-10-CM | POA: Diagnosis not present

## 2020-02-21 LAB — RESPIRATORY PANEL BY RT PCR (FLU A&B, COVID)
Influenza A by PCR: NEGATIVE
Influenza B by PCR: NEGATIVE
SARS Coronavirus 2 by RT PCR: NEGATIVE

## 2020-02-21 MED ORDER — ROPINIROLE HCL 0.25 MG PO TABS
0.2500 mg | ORAL_TABLET | Freq: Two times a day (BID) | ORAL | 0 refills | Status: DC | PRN
Start: 2020-02-21 — End: 2020-07-14

## 2020-02-21 MED ORDER — AZITHROMYCIN 250 MG PO TABS
250.0000 mg | ORAL_TABLET | Freq: Every day | ORAL | 0 refills | Status: DC
Start: 2020-02-21 — End: 2020-07-14

## 2020-02-21 MED ORDER — OXYCODONE HCL 5 MG PO TABS
2.5000 mg | ORAL_TABLET | Freq: Four times a day (QID) | ORAL | Status: DC | PRN
  Administered 2020-02-21: 2.5 mg via ORAL
  Filled 2020-02-21: qty 1

## 2020-02-21 MED ORDER — ROPINIROLE HCL 0.25 MG PO TABS
0.2500 mg | ORAL_TABLET | Freq: Two times a day (BID) | ORAL | Status: DC | PRN
  Filled 2020-02-21: qty 1

## 2020-02-21 MED ORDER — OXYCODONE HCL 5 MG PO TABS
2.5000 mg | ORAL_TABLET | Freq: Four times a day (QID) | ORAL | 0 refills | Status: DC | PRN
Start: 2020-02-21 — End: 2020-07-14

## 2020-02-21 NOTE — Care Management Important Message (Signed)
Important Message  Patient Details  Name: Joann Barnes MRN: [id number] Date of Birth: 1940/05/27   Medicare Important Message Given:  Yes     Orbie Pyo 02/21/2020, 3:02 PM

## 2020-02-21 NOTE — TOC Transition Note (Signed)
Transition of Care [facility]) - CM/SW Discharge Note   Patient Details  Name: Joann Barnes MRN: [id number] Date of Birth: June 04, 1940  [facility] of [facility] CM/SW Contact:  Geralynn Ochs, LCSW Phone Number: 02/21/2020, 12:19 PM   Clinical Narrative:   Nurse to call report to [phone], Room 102B    Final next level of care: Skilled Nursing Facility Barriers to Discharge: Barriers Resolved   Patient Goals and CMS Choice Patient states their goals for this hospitalization and ongoing recovery are:: To get rehab CMS Medicare.gov Compare Post Acute Care list provided to:: Patient Choice offered to / list presented to : Patient  Discharge Placement              Patient chooses bed at: [facility] Patient to be transferred to facility by: Family car Name of family member notified: [name] Patient and family notified of of transfer: 02/21/20  Discharge Plan and Services     Post Acute Care Choice: [place]                               Social Determinants of Health (SDOH) Interventions     Readmission Risk Interventions No flowsheet data found.

## 2020-02-21 NOTE — Progress Notes (Signed)
Telephone report called to [facility] and given to [facility] LPN

## 2020-02-21 NOTE — Progress Notes (Signed)
Physical Therapy Treatment Patient Details Name: Joann Barnes MRN: [id number] DOB: 10-31-1940 Today's Date: 02/21/2020    History of Present Illness Joann Barnes is a 79 y.o. F with COPD not on O2, HTN, hypothyroidism, PLIF 2019, and smoking who presented with confusion thought to be due to polypharmacy. Of note, pt exposed to sister who had covid and received monoclonal Abs/REGEN-COV. Covid test neg upon admission     PT Comments    Pt is limited by fatigue this session, reporting increased work of breathing and LE weakness when performing hygiene activities in bathroom. Pt remains impulsive and has poor awareness of her mobility deficits during session. Pt requires assistance for device management at times during session as well. At end of session pt's caregiver [name], pt's sister) is in agreement with PT that discharge home would not be safe at this point as she is unable to provide necessary assistance to maintain patient safety. PT continues to recommend SNF placement at this time.   Follow Up Recommendations  SNF;Supervision/Assistance - 24 hour     Equipment Recommendations  Wheelchair (measurements PT) (if D/C home)    Recommendations for Other Services       Precautions / Restrictions Precautions Precautions: Fall Precaution Comments: falling at a wedding prior to admission Restrictions Weight Bearing Restrictions: No    Mobility  Bed Mobility Overal bed mobility: Needs Assistance Bed Mobility: Supine to Sit;Sit to Supine     Supine to sit: Supervision Sit to supine: Supervision      Transfers Overall transfer level: Needs assistance Equipment used: Rolling walker (2 wheeled) Transfers: Sit to/from Stand Sit to Stand: Min guard;Min assist         General transfer comment: pt requiring increased assistance with transfers once fatigued and from lower surfaces  Ambulation/Gait Ambulation/Gait assistance: Min assist Gait Distance (Feet): 15 Feet (15' x  2) Assistive device: Rolling walker (2 wheeled) Gait Pattern/deviations: Step-to pattern Gait velocity: reduced Gait velocity interpretation: <1.8 ft/sec, indicate of risk for recurrent falls General Gait Details: pt with short step to gait, increased sway and anterior trunk lean, poor device management in tight quarters   Stairs             Wheelchair Mobility    Modified Rankin (Stroke Patients Only)       Balance Overall balance assessment: Needs assistance Sitting-balance support: No upper extremity supported;Feet supported Sitting balance-Leahy Scale: Fair     Standing balance support: Bilateral upper extremity supported Standing balance-Leahy Scale: Poor Standing balance comment: reliant on UE support and minG                            Cognition Arousal/Alertness: Awake/alert Behavior During Therapy: Impulsive Overall Cognitive Status: Impaired/Different from baseline Area of Impairment: Memory;Safety/judgement                     Memory: Decreased recall of precautions;Decreased short-term memory   Safety/Judgement: Decreased awareness of safety;Decreased awareness of deficits            Exercises      General Comments General comments (skin integrity, edema, etc.): pt reports increased work of breathing when on commode, does recover quickly with return to bed      Pertinent Vitals/Pain Pain Assessment: Faces Faces Pain Scale: Hurts even more Pain Location: R hip Pain Descriptors / Indicators: Aching Pain Intervention(s): Monitored during session    Home Living  Prior Function            PT Goals (current goals can now be found in the care plan section) Acute Rehab PT Goals Patient Stated Goal: to improve mobility and reduce falls risk Progress towards PT goals: Not progressing toward goals - comment (limited by fatigue)    Frequency    Min 3X/week      PT Plan Current plan  remains appropriate    Co-evaluation              AM-PAC PT "6 Clicks" Mobility   Outcome Measure  Help needed turning from your back to your side while in a flat bed without using bedrails?: None Help needed moving from lying on your back to sitting on the side of a flat bed without using bedrails?: None Help needed moving to and from a bed to a chair (including a wheelchair)?: A Little Help needed standing up from a chair using your arms (e.g., wheelchair or bedside chair)?: A Little Help needed to walk in hospital room?: A Little Help needed climbing 3-5 steps with a railing? : Total 6 Click Score: 18    End of Session   Activity Tolerance: Patient limited by fatigue Patient left: in bed;with call bell/phone within reach;with chair alarm set;with family/visitor present Nurse Communication: Mobility status PT Visit Diagnosis: Muscle weakness (generalized) (M62.81);Difficulty in walking, not elsewhere classified (R26.2)     Time: 7289-7915 PT Time Calculation (min) (ACUTE ONLY): 20 min  Charges:  $Therapeutic Activity: 8-22 mins                     Zenaida Niece, PT, DPT Acute Rehabilitation Pager: [phone]    Zenaida Niece 02/21/2020, 11:12 AM

## 2020-02-21 NOTE — Discharge Summary (Signed)
Physician Discharge Summary  Rakeb Kibble Deeney YQM:[id number] DOB: 1941-01-30 DOA: 02/17/2020  PCP: Merrilee Seashore, MD  Admit date: 02/17/2020 Discharge date: 02/21/2020  Admitted From: Home  Disposition:  SNF [place]   Recommendations for Outpatient Follow-up:  1. Follow up with PCP Dr. Ashby Dawes within 1 week after discharge from SNF 2. Dr. Ashby Dawes: Please repeat CBC and iron studies in 6-8 weeks 3. Dr. Ashby Dawes: Please repeat chest imaging to document resolution of opacity in 4-6 weeks      Home Health: N/A  Equipment/Devices: TBD at SNF  Discharge Condition: Fair  CODE STATUS: FULL Diet recommendation: Cardiac  Brief/Interim Summary: Mrs. Amendola is a 79 y.o. F with COPD not on O2, HTN, hypothyroidism, and smoking who presented with confusion.  Evidently, patient lives with her 80 year old sister, and was oriented (recovering from a COPD flare with a prednisone burst) up until admission.  The day of admission, patient woke confused, delirious and "talking out of her head", family brought her to the ER.  In the ER, CT head normal.  CXR clear and UA without inflammation.  COVID negative, procal undetectable.  UDS positive for opiates and benzodiazepines.  WBC 17K, electrolytes normal, renal function at baseline.  She was started on broad spectrum antibiotics and the hospitalists were consulted for work up.       PRINCIPAL HOSPITAL DIAGNOSIS: Delirium due to medication excess    Discharge Diagnoses:   Acute metabolic encephalopathy due to anticholinergic toxidrome due to medication excess Patient admitted and started on IV fluids and telemetry.    Patient and sister reported recent fall and flare of back pain/sciatica, for which patient was prescribed new pain medicine, and patient and sister have a hard time articulating what medicines she was taking for pain.  Benzodiazepines and opiates were detected in urine drug screen (review of PDMP  shows she had a recent new Norco rx, but also old Valium and Tramadol scripts).  Also has new gabapentin script and amitriptyline script. Was also recently on steroids 50 mg pred for 5 days.  Possible pneumonia on CT chest, it may be that infection is contributing, which we are treating.  Patient's mentation steadily improved with IV fluids and withholding psychoactive medicines.  She developed no new fever or CNS symptoms.    Stroke ruled out with MRI.  Status epilepticus ruled out.  TSH, B12 and RPR normal.   No symptoms to suggest CNS infection.  Beer's criteria medicines should be avoided in this patient, including amitriptyline, gabapentin, diazepam.  Opiates, including tramadol should be used in the lowest tolerable dose.  Prednisone should be used sparingly.  Zolpidem is relatively contraindicated.     Possible right upper lobe community acquired pneumonia CT obtained due to dyspnea and elevated D-dimer.  This ruled out PE but did show some mild patchy RUL pneumonia.  Patient treated with azithromycin 5 days.  -Follow up chest imaging in 4-6 weeks   COVID exposure without infection Patient's sister had had URI symptoms several weeks ago.  Patient is unsure if her sister tested positive for COVID or not.  The patient called her Cardiologist's office, who arranged a REGEN-COV infusion on 10/4, despite that the patient did not test positive for COVID.  The patient had no URI symptoms herself, and her COVID test here was negative.  No reason to suspect COVID infection in this patient.  Hypertension  Hypothyroidism  COPD without exacerbation No acute disease  CKD IIIa Cr stable relative to baseline  Hyponatremia  Anemia, unknown cause, microcytic -Repeat CBC and ferritin in 6 weeks  Anisocoria From Chart review, this is old          Discharge Instructions  Discharge Instructions    Diet - low sodium heart healthy   Complete by: As directed    Discharge  instructions   Complete by: As directed    From Dr. Loleta Books: You were admitted for confusion.  We ruled out that this was related to stroke, seizure, and we believe it was from medication excess.  AVOID the following medicines: Gabapentin Amitriptyline/Elavil Diazepam/Valium Hydrocodone/Norco/Vicodin Tramadol/Ultram  Individually and at small doses, under close observation by a doctor and taken only as prescribed, these medicines may be safe for you.  But taken more than prescribed, or taken in combination, they are clearly dangerous.  Discuss with Dr. Ashby Dawes at your follow up appointment (schedule to see him within 1 week after leaving rehab)  Go see Dr. Arnoldo Morale as needed  Avoid use of prednisone, Valium for back and leg pain  For now, use acetaminophen plus low dose of oxycodone as needed  It may also have been that you had a small pneumonia contributing to your confusion Complete 3 more days antibiotics for this Go ask Dr. Ashby Dawes to repeat chest imaging in 4-6 weeks   Increase activity slowly   Complete by: As directed      Allergies as of 02/21/2020      Reactions   Bactrim [sulfamethoxazole-trimethoprim] Shortness Of Breath   Nicotine Anaphylaxis, Other (See Comments)   Rash and itch with patch locally  Chewing the gum closed her throat   Codeine Itching, Nausea And Vomiting   Lipitor [atorvastatin] Other (See Comments)   Muscle aches   Benadryl [diphenhydramine Hcl] Other (See Comments)   Drowsiness   Chantix [varenicline Tartrate] Hives, Itching, Nausea And Vomiting, Rash   Chantix [varenicline] Hives, Itching, Nausea And Vomiting, Rash   Wellbutrin [bupropion] Rash   Slight rash around ankles   Zolpidem Tartrate Other (See Comments)   Bad dreams      Medication List    STOP taking these medications   amitriptyline 10 MG tablet Commonly known as: ELAVIL   diazepam 5 MG tablet Commonly known as: VALIUM   gabapentin 300 MG capsule Commonly  known as: NEURONTIN   HYDROcodone-acetaminophen 5-325 MG tablet Commonly known as: NORCO/VICODIN   predniSONE 50 MG tablet Commonly known as: DELTASONE   traMADol 50 MG tablet Commonly known as: ULTRAM     TAKE these medications   albuterol 1.25 MG/3ML nebulizer solution Commonly known as: ACCUNEB Take 1 ampule by nebulization daily as needed for wheezing or shortness of breath.   ProAir HFA 108 (90 Base) MCG/ACT inhaler Generic drug: albuterol Inhale 2 puffs into the lungs 2 (two) times daily.   amLODipine 5 MG tablet Commonly known as: NORVASC Take 5 mg by mouth daily.   aspirin 81 MG EC tablet Take 1 tablet (81 mg total) by mouth daily.   azithromycin 250 MG tablet Commonly known as: ZITHROMAX Take 1 tablet (250 mg total) by mouth daily.   calcium carbonate 500 MG chewable tablet Commonly known as: TUMS - dosed in mg elemental calcium Chew 2 tablets by mouth daily as needed for indigestion or heartburn.   escitalopram 20 MG tablet Commonly known as: LEXAPRO Take 20 mg by mouth daily.   ibandronate 3 MG/3ML Soln injection Commonly known as: Boniva Inject 3 mLs (3 mg total) into the vein every 3 (three)  months.   Levoxyl 25 MCG tablet Generic drug: levothyroxine Take 25 mcg by mouth daily.   olmesartan 5 MG tablet Commonly known as: BENICAR Take 5 mg by mouth daily.   omeprazole 40 MG capsule Commonly known as: PRILOSEC Take 40 mg by mouth daily before breakfast.   oxyCODONE 5 MG immediate release tablet Commonly known as: Oxy IR/ROXICODONE Take 0.5 tablets (2.5 mg total) by mouth every 6 (six) hours as needed for breakthrough pain.   pravastatin 80 MG tablet Commonly known as: PRAVACHOL Take 80 mg by mouth at bedtime.   rOPINIRole 0.25 MG tablet Commonly known as: REQUIP Take 1 tablet (0.25 mg total) by mouth 2 (two) times daily as needed for up to 20 days (leg discomfort).   Trelegy Ellipta 100-62.5-25 MCG/INH Aepb Generic drug:  Fluticasone-Umeclidin-Vilant Inhale 1 puff into the lungs daily.   vitamin C 250 MG tablet Commonly known as: ASCORBIC ACID Take 250 mg by mouth daily.   Vitamin D3 50 MCG (2000 UT) Tabs Take 2,000 Units by mouth daily.       Follow-up Information    Merrilee Seashore, MD. Schedule an appointment as soon as possible for a visit in 1 week(s).   Specialty: Internal Medicine Contact information: [address] [place] [address] [phone]        Newman Pies, MD. Go to.   Specialty: Neurosurgery Contact information: [address] [place] [address] [phone]              Allergies  Allergen Reactions  . Bactrim [Sulfamethoxazole-Trimethoprim] Shortness Of Breath  . Nicotine Anaphylaxis and Other (See Comments)    Rash and itch with patch locally  Chewing the gum closed her throat  . Codeine Itching and Nausea And Vomiting  . Lipitor [Atorvastatin] Other (See Comments)    Muscle aches  . Benadryl [Diphenhydramine Hcl] Other (See Comments)    Drowsiness   . Chantix [Varenicline Tartrate] Hives, Itching, Nausea And Vomiting and Rash  . Chantix [Varenicline] Hives, Itching, Nausea And Vomiting and Rash  . Wellbutrin [Bupropion] Rash    Slight rash around ankles  . Zolpidem Tartrate Other (See Comments)    Bad dreams       Procedures/Studies: EEG  Result Date: 02/18/2020 Lora Havens, MD     02/18/2020  4:29 PM Patient Name: FRANCINA BEERY MRN: [id number] Epilepsy Attending: Lora Havens Referring Physician/Provider: Dr Myrene Buddy Date: 02/17/2020 Duration: 23.48 mins Patient history: 79yo F with ams. EEG to evaluate for seizure. Level of alertness: Awake AEDs during EEG study: None Technical aspects: This EEG study was done with scalp electrodes positioned according to the 10-20 International system of electrode placement. Electrical activity was acquired at a sampling rate of 500Hz  and reviewed  with a high frequency filter of 70Hz  and a low frequency filter of 1Hz . EEG data were recorded continuously and digitally stored. Description: No posterior dominant rhythm was seen.  EEG showed continuous generalized 3 to 6 Hz theta-delta slowing.  Hyperventilation and photic stimulation were not performed.   EEG was technically difficult due to significant eye flutter artifact was noted. ABNORMALITY -Continuous slow, generalized IMPRESSION: This study is suggestive of mild to moderate diffuse encephalopathy, nonspecific etiology. No seizures or epileptiform discharges were seen throughout the recording. Lora Havens   CT Head Wo Contrast  Result Date: 02/17/2020 CLINICAL DATA:  Mental status changes, COVID-19 positivity EXAM: CT HEAD WITHOUT CONTRAST TECHNIQUE: Contiguous axial images were obtained from the  base of the skull through the vertex without intravenous contrast. COMPARISON:  None. FINDINGS: Brain: No evidence of acute infarction, hemorrhage, hydrocephalus, extra-axial collection or mass lesion/mass effect. Vascular: No hyperdense vessel or unexpected calcification. Skull: Normal. Negative for fracture or focal lesion. Sinuses/Orbits: No acute finding. Other: None. IMPRESSION: No acute intracranial abnormality noted. Electronically Signed   By: Inez Catalina M.D.   On: 02/17/2020 16:52   CT ANGIO CHEST PE W OR WO CONTRAST  Result Date: 02/19/2020 CLINICAL DATA:  79 year old female with positive D-dimer. Concern for pulmonary embolism. EXAM: CT ANGIOGRAPHY CHEST WITH CONTRAST TECHNIQUE: Multidetector CT imaging of the chest was performed using the standard protocol during bolus administration of intravenous contrast. Multiplanar CT image reconstructions and MIPs were obtained to evaluate the vascular anatomy. CONTRAST:  8mL OMNIPAQUE IOHEXOL 350 MG/ML SOLN COMPARISON:  Chest radiograph dated 02/17/2020 and CT dated 04/06/2019. FINDINGS: Cardiovascular: There is no cardiomegaly or pericardial  effusion. There is coronary vascular calcification. There is advanced atherosclerotic calcification of the thoracic aorta. No aneurysmal dilatation. No CT evidence of pulmonary embolism. Mediastinum/Nodes: Top-normal right hilar lymph nodes measure 9 mm in short axis. No adenopathy. There is a small hiatal hernia. The esophagus is grossly unremarkable. No mediastinal fluid collection. Lungs/Pleura: There is a cluster of nodular density in the right upper lobe, new since the prior CT and concerning for an inflammatory/infectious etiology. Clinical correlation and follow-up to resolution recommended. Additional faint scattered subpleural hazy densities noted which may represent atelectasis or atypical infiltrate. There are bibasilar linear atelectasis/scarring. No lobar consolidation, pleural effusion, pneumothorax. The central airways are patent. Upper Abdomen: Mild bilateral adrenal thickening. Musculoskeletal: Degenerative changes of the spine. No acute osseous pathology. Review of the MIP images confirms the above findings. IMPRESSION: 1. No CT evidence of pulmonary embolism. 2. Cluster of nodular density in the right upper lobe concerning for an inflammatory/infectious etiology. Clinical correlation and follow-up to resolution recommended. 3. Aortic Atherosclerosis (ICD10-I70.0). Electronically Signed   By: Anner Crete M.D.   On: 02/19/2020 22:49   MR BRAIN WO CONTRAST  Result Date: 02/18/2020 CLINICAL DATA:  Mental status change, unknown cause. EXAM: MRI HEAD WITHOUT CONTRAST TECHNIQUE: Multiplanar, multiecho pulse sequences of the brain and surrounding structures were obtained without intravenous contrast. COMPARISON:  Noncontrast head CT 02/17/2020.  Brain MRI 04/23/2014 FINDINGS: Brain: The patient was not able to tolerate the full examination. As a result, the routine axial T1 weighted sequence and coronal T2 weighted sequence could not be obtained. Intermittent motion degradation. Most notably,  there is moderate motion degradation of the sagittal T1 weighted sequence. Mild generalized cerebral atrophy. Minimal scattered white matter and periventricular T2/FLAIR hyperintensity, nonspecific but compatible with chronic small vessel ischemic disease. There is no acute infarct. No evidence of intracranial mass. No chronic intracranial blood products. No extra-axial fluid collection. No midline shift. Vascular: Expected proximal arterial flow voids. Skull and upper cervical spine: No focal marrow lesion. Susceptibility artifact arising from cervical spinal fusion hardware. Sinuses/Orbits: Visualized orbits show no acute finding. Mucosal thickening and air-fluid level within the left maxillary sinus. Frothy secretions and mild mucosal thickening within the left sphenoid sinus. Trace fluid within bilateral mastoid air cells. IMPRESSION: Prematurely terminated and motion degraded examination. No evidence of acute intracranial abnormality, including acute infarction. Mild generalized cerebral atrophy and chronic small vessel ischemic disease. Left sphenoid and maxillary sinusitis. Small bilateral mastoid effusions. Electronically Signed   By: Kellie Simmering DO   On: 02/18/2020 14:25   DG Chest Port 1 [address]  Result Date: 02/17/2020 CLINICAL DATA:  COVID-19 positivity with confusion, initial encounter EXAM: PORTABLE CHEST 1 VIEW COMPARISON:  02/11/2020 FINDINGS: Cardiac shadow is stable. Aortic calcifications are again seen. No acute infiltrate or sizable effusion is seen. Postsurgical changes are noted. No bony abnormality is seen. IMPRESSION: No active disease. Electronically Signed   By: Inez Catalina M.D.   On: 02/17/2020 16:04   DG Chest Port 1 View  Result Date: 02/11/2020 CLINICAL DATA:  Shortness of breath, history of positive test on 10/02. EXAM: PORTABLE CHEST 1 VIEW COMPARISON:  02/04/2016 FINDINGS: Central pulmonary vascular engorgement. Mild cardiac enlargement though cardiac silhouette accentuated  by portable technique. Mild increased interstitial prominence. The no pleural effusion. Streaky opacities at the lung bases. On limited assessment no acute skeletal process. IMPRESSION: Cardiomegaly with mild central pulmonary vascular engorgement and increased interstitial prominence, this may represent mild edema or viral infection. Very subtle peripheral opacities could reflect atelectasis. No lobar consolidation. Electronically Signed   By: Zetta Bills M.D.   On: 02/11/2020 10:36       Subjective: Feeling well.  Stil weak and somewhat impulsive, but no fever, chest pain, sputum, hemoptysis.  No abdominal pain, urinary symptoms.  No focal weakness, numbness.  Discharge Exam: Vitals:   02/21/20 0431 02/21/20 0828  BP: (!) 155/50 (!) 163/63  Pulse: 76 93  Resp: 16 18  Temp: 97.9 F (36.6 C) 98 F (36.7 C)  SpO2: 96% 96%   Vitals:   02/20/20 2059 02/20/20 2340 02/21/20 0431 02/21/20 0828  BP: (!) 162/64 (!) 159/44 (!) 155/50 (!) 163/63  Pulse: 83 86 76 93  Resp: 16 17 16 18   Temp: 98.1 F (36.7 C) (!) 97.5 F (36.4 C) 97.9 F (36.6 C) 98 F (36.7 C)  TempSrc: Oral Oral Oral Oral  SpO2: 97% 100% 96% 96%    General: Pt is alert, awake, not in acute distress, lying in bed, appears tired. Cardiovascular: RRR, nl S1-S2, no murmurs appreciated.   No LE edema.   Respiratory: Normal respiratory rate and rhythm.  CTAB without rales or wheezes. Abdominal: Abdomen soft and non-tender.  No distension or HSM.   Neuro/Psych: Strength symmetric in upper and lower extremities.  Judgment and insight appear slightly impaired, but oriented to self, situation, place and time.   The results of significant diagnostics from this hospitalization (including imaging, microbiology, ancillary and laboratory) are listed below for reference.     Microbiology: Recent Results (from the past 240 hour(s))  Respiratory Panel by RT PCR (Flu A&B, Covid) - Nasopharyngeal Swab     Status: None    Collection Time: 02/17/20  3:33 PM   Specimen: Nasopharyngeal Swab  Result Value Ref Range Status   SARS Coronavirus 2 by RT PCR NEGATIVE NEGATIVE Final    Comment: (NOTE) SARS-CoV-2 target nucleic acids are NOT DETECTED.  The SARS-CoV-2 RNA is generally detectable in upper respiratoy specimens during the acute phase of infection. The lowest concentration of SARS-CoV-2 viral copies this assay can detect is 131 copies/mL. A negative result does not preclude SARS-Cov-2 infection and should not be used as the sole basis for treatment or other patient management decisions. A negative result may occur with  improper specimen collection/handling, submission of specimen other than nasopharyngeal swab, presence of viral mutation(s) within the areas targeted by this assay, and inadequate number of viral copies (<131 copies/mL). A negative result must be combined with clinical observations, patient history, and epidemiological information. The expected result is Negative.  Fact Sheet for  Patients:  PinkCheek.be  Fact Sheet for Healthcare Providers:  GravelBags.it  This test is no t yet approved or cleared by the [place] FDA and  has been authorized for detection and/or diagnosis of SARS-CoV-2 by FDA under an Emergency Use Authorization (EUA). This EUA will remain  in effect (meaning this test can be used) for the duration of the COVID-19 declaration under Section 564(b)(1) of the Act, 21 U.S.C. section 360bbb-3(b)(1), unless the authorization is terminated or revoked sooner.     Influenza A by PCR NEGATIVE NEGATIVE Final   Influenza B by PCR NEGATIVE NEGATIVE Final    Comment: (NOTE) The Xpert Xpress SARS-CoV-2/FLU/RSV assay is intended as an aid in  the diagnosis of influenza from Nasopharyngeal swab specimens and  should not be used as a sole basis for treatment. Nasal washings and  aspirates are unacceptable for Xpert  Xpress SARS-CoV-2/FLU/RSV  testing.  Fact Sheet for Patients: PinkCheek.be  Fact Sheet for Healthcare Providers: GravelBags.it  This test is not yet approved or cleared by the [place] FDA and  has been authorized for detection and/or diagnosis of SARS-CoV-2 by  FDA under an Emergency Use Authorization (EUA). This EUA will remain  in effect (meaning this test can be used) for the duration of the  Covid-19 declaration under Section 564(b)(1) of the Act, 21  U.S.C. section 360bbb-3(b)(1), unless the authorization is  terminated or revoked. Performed at [facility], [place] [address], [place], [place] [address]   Blood Culture (routine x 2)     Status: None (Preliminary result)   Collection Time: 02/17/20  3:34 PM   Specimen: BLOOD LEFT HAND  Result Value Ref Range Status   Specimen Description BLOOD LEFT HAND  Final   Special Requests   Final    BOTTLES DRAWN AEROBIC AND ANAEROBIC Blood Culture adequate volume   Culture   Final    NO GROWTH 3 DAYS Performed at [facility], [place] [address] [place], [place] [address]    Report Status PENDING  Incomplete  Blood Culture (routine x 2)     Status: None (Preliminary result)   Collection Time: 02/17/20  4:11 PM   Specimen: BLOOD  Result Value Ref Range Status   Specimen Description BLOOD RIGHT ANTECUBITAL  Final   Special Requests   Final    BOTTLES DRAWN AEROBIC AND ANAEROBIC Blood Culture results may not be optimal due to an excessive volume of blood received in culture bottles   Culture   Final    NO GROWTH 3 DAYS Performed at [facility], [place] [address] [place], [place] [address]    Report Status PENDING  Incomplete  Urine culture     Status: Abnormal   Collection Time: 02/17/20  8:13 PM   Specimen: Urine, Clean Catch  Result Value Ref Range Status   Specimen Description URINE, CLEAN CATCH  Final   Special Requests NONE  Final   Culture (A)   Final    <10,000 COLONIES/mL INSIGNIFICANT GROWTH Performed at [facility], [place] [address] [place], [place] [address]    Report Status 02/19/2020 FINAL  Final  MRSA PCR Screening     Status: None   Collection Time: 02/19/20  7:11 PM   Specimen: Nasopharyngeal  Result Value Ref Range Status   MRSA by PCR NEGATIVE NEGATIVE Final    Comment:        The GeneXpert MRSA Assay (FDA approved for NASAL specimens only), is one component  of a comprehensive MRSA colonization surveillance program. It is not intended to diagnose MRSA infection nor to guide or monitor treatment for MRSA infections. Performed at [facility], [place] [address] [place], [place] [address]      Labs: BNP (last 3 results) No results for input(s): BNP in the last 8760 hours. Basic Metabolic Panel: Recent Labs  Lab 02/17/20 1611 02/17/20 1900 02/17/20 2048 02/18/20 0516 02/19/20 0615 02/20/20 0743  NA 136 135  --  134* 135 139  K 4.0 4.1  --  3.8 3.9 3.8  CL 100  --   --  101 102 106  CO2 25  --   --  23 24 21*  GLUCOSE 109*  --   --  93 88 69*  BUN 16  --   --  12 16 15   CREATININE 1.18*  --  1.05* 0.97 0.99 1.06*  CALCIUM 8.6*  --   --  8.4* 8.5* 8.1*   Liver Function Tests: Recent Labs  Lab 02/17/20 1611 02/18/20 0516 02/19/20 0615  AST 16 14* 15  ALT 23 19 19   ALKPHOS 108 95 97  BILITOT 0.8 0.9 0.8  PROT 6.2* 5.4* 5.5*  ALBUMIN 3.0* 2.5* 2.6*   Recent Labs  Lab 02/17/20 1611  LIPASE 20   Recent Labs  Lab 02/19/20 0615  AMMONIA 20   CBC: Recent Labs  Lab 02/17/20 1611 02/17/20 1611 02/17/20 1900 02/17/20 2048 02/18/20 0516 02/19/20 0615 02/20/20 0743  WBC 17.1*  --   --  16.4* 14.3* 12.3* 11.1*  NEUTROABS 13.8*  --   --   --   --   --   --   HGB 10.9*   < > 10.9* 10.4* 10.2* 9.7* 9.1*  HCT 34.1*   < > 32.0* 32.6* 32.0* 29.6* 29.1*  MCV 82.8  --   --  82.3 81.0 80.7 82.2  PLT 510*  --   --  476* 443* 482* 515*   < > = values in this interval not displayed.    Cardiac Enzymes: No results for input(s): CKTOTAL, CKMB, CKMBINDEX, TROPONINI in the last 168 hours. BNP: Invalid input(s): POCBNP CBG: Recent Labs  Lab 02/18/20 1101 02/19/20 0604  GLUCAP 108* 78   D-Dimer No results for input(s): DDIMER in the last 72 hours. Hgb A1c No results for input(s): HGBA1C in the last 72 hours. Lipid Profile No results for input(s): CHOL, HDL, LDLCALC, TRIG, CHOLHDL, LDLDIRECT in the last 72 hours. Thyroid function studies No results for input(s): TSH, T4TOTAL, T3FREE, THYROIDAB in the last 72 hours.  Invalid input(s): FREET3 Anemia work up Recent Labs    02/20/20 0557  VITAMINB12 188  TIBC 276  IRON 37   Urinalysis    Component Value Date/Time   COLORURINE YELLOW 02/17/2020 2032   APPEARANCEUR CLEAR 02/17/2020 2032   LABSPEC 1.014 02/17/2020 2032   [place] 7.0 02/17/2020 2032   [place] 02/17/2020 2032   HGBUR NEGATIVE 02/17/2020 2032   BILIRUBINUR NEGATIVE 02/17/2020 2032   KETONESUR 5 (A) 02/17/2020 2032   PROTEINUR NEGATIVE 02/17/2020 2032   UROBILINOGEN 1.0 03/29/2007 1449   NITRITE NEGATIVE 02/17/2020 2032   LEUKOCYTESUR NEGATIVE 02/17/2020 2032   Sepsis Labs Invalid input(s): PROCALCITONIN,  WBC,  LACTICIDVEN Microbiology Recent Results (from the past 240 hour(s))  Respiratory Panel by RT PCR (Flu A&B, Covid) - Nasopharyngeal Swab     Status: None   Collection Time: 02/17/20  3:33 PM   Specimen: Nasopharyngeal Swab  Result Value  Ref Range Status   SARS Coronavirus 2 by RT PCR NEGATIVE NEGATIVE Final    Comment: (NOTE) SARS-CoV-2 target nucleic acids are NOT DETECTED.  The SARS-CoV-2 RNA is generally detectable in upper respiratoy specimens during the acute phase of infection. The lowest concentration of SARS-CoV-2 viral copies this assay can detect is 131 copies/mL. A negative result does not preclude SARS-Cov-2 infection and should not be used as the sole basis for treatment or other patient management  decisions. A negative result may occur with  improper specimen collection/handling, submission of specimen other than nasopharyngeal swab, presence of viral mutation(s) within the areas targeted by this assay, and inadequate number of viral copies (<131 copies/mL). A negative result must be combined with clinical observations, patient history, and epidemiological information. The expected result is Negative.  Fact Sheet for Patients:  PinkCheek.be  Fact Sheet for Healthcare Providers:  GravelBags.it  This test is no t yet approved or cleared by the [place] FDA and  has been authorized for detection and/or diagnosis of SARS-CoV-2 by FDA under an Emergency Use Authorization (EUA). This EUA will remain  in effect (meaning this test can be used) for the duration of the COVID-19 declaration under Section 564(b)(1) of the Act, 21 U.S.C. section 360bbb-3(b)(1), unless the authorization is terminated or revoked sooner.     Influenza A by PCR NEGATIVE NEGATIVE Final   Influenza B by PCR NEGATIVE NEGATIVE Final    Comment: (NOTE) The Xpert Xpress SARS-CoV-2/FLU/RSV assay is intended as an aid in  the diagnosis of influenza from Nasopharyngeal swab specimens and  should not be used as a sole basis for treatment. Nasal washings and  aspirates are unacceptable for Xpert Xpress SARS-CoV-2/FLU/RSV  testing.  Fact Sheet for Patients: PinkCheek.be  Fact Sheet for Healthcare Providers: GravelBags.it  This test is not yet approved or cleared by the [place] FDA and  has been authorized for detection and/or diagnosis of SARS-CoV-2 by  FDA under an Emergency Use Authorization (EUA). This EUA will remain  in effect (meaning this test can be used) for the duration of the  Covid-19 declaration under Section 564(b)(1) of the Act, 21  U.S.C. section 360bbb-3(b)(1), unless the  authorization is  terminated or revoked. Performed at [facility], [place] [address], [place], [place] [address]   Blood Culture (routine x 2)     Status: None (Preliminary result)   Collection Time: 02/17/20  3:34 PM   Specimen: BLOOD LEFT HAND  Result Value Ref Range Status   Specimen Description BLOOD LEFT HAND  Final   Special Requests   Final    BOTTLES DRAWN AEROBIC AND ANAEROBIC Blood Culture adequate volume   Culture   Final    NO GROWTH 3 DAYS Performed at [facility], [place] [address] [place], [place] [address]    Report Status PENDING  Incomplete  Blood Culture (routine x 2)     Status: None (Preliminary result)   Collection Time: 02/17/20  4:11 PM   Specimen: BLOOD  Result Value Ref Range Status   Specimen Description BLOOD RIGHT ANTECUBITAL  Final   Special Requests   Final    BOTTLES DRAWN AEROBIC AND ANAEROBIC Blood Culture results may not be optimal due to an excessive volume of blood received in culture bottles   Culture   Final    NO GROWTH 3 DAYS Performed at [facility], [place] [address] [place], [place] [address]    Report Status PENDING  Incomplete  Urine culture     Status: Abnormal   Collection Time: 02/17/20  8:13 PM   Specimen: Urine, Clean Catch  Result Value Ref Range Status   Specimen Description URINE, CLEAN CATCH  Final   Special Requests NONE  Final   Culture (A)  Final    <10,000 COLONIES/mL INSIGNIFICANT GROWTH Performed at [facility], [address], [place], [place] [address]    Report Status 02/19/2020 FINAL  Final  MRSA PCR Screening     Status: None   Collection Time: 02/19/20  7:11 PM   Specimen: Nasopharyngeal  Result Value Ref Range Status   MRSA by PCR NEGATIVE NEGATIVE Final    Comment:        The GeneXpert MRSA Assay (FDA approved for NASAL specimens only), is one component of a comprehensive MRSA colonization surveillance program. It is not intended to diagnose MRSA infection nor to guide  or monitor treatment for MRSA infections. Performed at [facility], [place] [address], [place], [place] [address]      Time coordinating discharge: 35 minutes The  controlled substances registry was reviewed for this patient prior to filling the <5 days supply controlled substances script.      SIGNED:   Edwin Dada, MD  [facility] 02/21/2020, 11:13 AM

## 2020-02-22 DIAGNOSIS — I10 Essential (primary) hypertension: Secondary | ICD-10-CM | POA: Diagnosis not present

## 2020-02-22 DIAGNOSIS — N1831 Chronic kidney disease, stage 3a: Secondary | ICD-10-CM | POA: Diagnosis not present

## 2020-02-22 DIAGNOSIS — E039 Hypothyroidism, unspecified: Secondary | ICD-10-CM | POA: Diagnosis not present

## 2020-02-22 DIAGNOSIS — J449 Chronic obstructive pulmonary disease, unspecified: Secondary | ICD-10-CM | POA: Diagnosis not present

## 2020-02-22 LAB — CULTURE, BLOOD (ROUTINE X 2)
Culture: NO GROWTH
Culture: NO GROWTH
Special Requests: ADEQUATE

## 2020-02-26 DIAGNOSIS — R5381 Other malaise: Secondary | ICD-10-CM | POA: Diagnosis not present

## 2020-02-26 DIAGNOSIS — M25559 Pain in unspecified hip: Secondary | ICD-10-CM | POA: Diagnosis not present

## 2020-02-26 DIAGNOSIS — R262 Difficulty in walking, not elsewhere classified: Secondary | ICD-10-CM | POA: Diagnosis not present

## 2020-02-28 DIAGNOSIS — R0989 Other specified symptoms and signs involving the circulatory and respiratory systems: Secondary | ICD-10-CM | POA: Diagnosis not present

## 2020-02-28 DIAGNOSIS — Z981 Arthrodesis status: Secondary | ICD-10-CM | POA: Diagnosis not present

## 2020-02-28 DIAGNOSIS — J449 Chronic obstructive pulmonary disease, unspecified: Secondary | ICD-10-CM | POA: Diagnosis not present

## 2020-02-28 DIAGNOSIS — R058 Other specified cough: Secondary | ICD-10-CM | POA: Diagnosis not present

## 2020-02-28 DIAGNOSIS — J189 Pneumonia, unspecified organism: Secondary | ICD-10-CM | POA: Diagnosis not present

## 2020-02-29 DIAGNOSIS — R058 Other specified cough: Secondary | ICD-10-CM | POA: Diagnosis not present

## 2020-02-29 DIAGNOSIS — Z8616 Personal history of COVID-19: Secondary | ICD-10-CM | POA: Diagnosis not present

## 2020-02-29 DIAGNOSIS — R0989 Other specified symptoms and signs involving the circulatory and respiratory systems: Secondary | ICD-10-CM | POA: Diagnosis not present

## 2020-02-29 DIAGNOSIS — J449 Chronic obstructive pulmonary disease, unspecified: Secondary | ICD-10-CM | POA: Diagnosis not present

## 2020-03-18 DIAGNOSIS — Z981 Arthrodesis status: Secondary | ICD-10-CM | POA: Diagnosis not present

## 2020-03-18 DIAGNOSIS — M545 Low back pain, unspecified: Secondary | ICD-10-CM | POA: Diagnosis not present

## 2020-04-07 ENCOUNTER — Ambulatory Visit (INDEPENDENT_AMBULATORY_CARE_PROVIDER_SITE_OTHER)
Admission: RE | Admit: 2020-04-07 | Discharge: 2020-04-07 | Disposition: A | Discharge status: Home / Self Care | Payer: PPO | Source: Ambulatory Visit | Attending: Internal Medicine | Admitting: Internal Medicine

## 2020-04-07 ENCOUNTER — Other Ambulatory Visit: Payer: Self-pay

## 2020-04-07 DIAGNOSIS — R911 Solitary pulmonary nodule: Secondary | ICD-10-CM | POA: Diagnosis not present

## 2020-04-07 DIAGNOSIS — S42102A Fracture of unspecified part of scapula, left shoulder, initial encounter for closed fracture: Secondary | ICD-10-CM | POA: Diagnosis not present

## 2020-04-08 DIAGNOSIS — M5416 Radiculopathy, lumbar region: Secondary | ICD-10-CM | POA: Diagnosis not present

## 2020-04-17 DIAGNOSIS — M9983 Other biomechanical lesions of lumbar region: Secondary | ICD-10-CM | POA: Diagnosis not present

## 2020-04-17 DIAGNOSIS — M5416 Radiculopathy, lumbar region: Secondary | ICD-10-CM | POA: Diagnosis not present

## 2020-04-17 DIAGNOSIS — M4696 Unspecified inflammatory spondylopathy, lumbar region: Secondary | ICD-10-CM | POA: Diagnosis not present

## 2020-04-17 DIAGNOSIS — M47816 Spondylosis without myelopathy or radiculopathy, lumbar region: Secondary | ICD-10-CM | POA: Diagnosis not present

## 2020-04-22 ENCOUNTER — Ambulatory Visit: Payer: PPO | Admitting: Orthopaedic Surgery

## 2020-05-01 DIAGNOSIS — R609 Edema, unspecified: Secondary | ICD-10-CM | POA: Diagnosis not present

## 2020-05-07 DIAGNOSIS — M48062 Spinal stenosis, lumbar region with neurogenic claudication: Secondary | ICD-10-CM | POA: Diagnosis not present

## 2020-05-19 NOTE — Progress Notes (Signed)
Joann Barnes - this CT done in dec 2021 was followup of nodule when I last saw her a year ago in Jan 2021. That nodule is not there but it appears since Oct 2021thereis fracture around her left shoulder. Please have her talk to  PCP and also c/w Jan 2021 vist -> pls giver her routine followup with APP  xxxx IMPRESSION: 1. Notable new osteoid deposition around a subacute displaced fracture the left acromion, also with fracture of the supraspinous portion of the left scapula. 2. The clustered right upper lobe nodularity shown on the prior exam has resolved. Scattered small chronic nodules and other inflammatory findings with a generally benign appearance. 3. Coronary, aortic arch, and branch vessel atherosclerotic vascular disease. 4. Small to moderate-sized type 1 hiatal hernia. 5. Mucous plugging in a left lower lobe bronchus. 6. Aortic atherosclerosis.  Aortic Atherosclerosis (ICD10-I70.0).   Electronically Signed   By: Van Clines M.D.   On: 04/07/2020 14:21      Exam Ended: 04/07/20 11:01

## 2020-05-22 ENCOUNTER — Telehealth: Payer: Self-pay | Admitting: Internal Medicine

## 2020-05-22 NOTE — Telephone Encounter (Signed)
Called and spoke to pt. Informed her of the results and recs per MR. Report sent to PCP at the request of the pt. Pt has an OV with her PCP on 06/11/2020 and OV with pulm on 06/03/2020 with Derl Barrow, NP.  Pt verbalized understanding and denied any further questions or concerns at this time.    Per Dr. Chase Caller: Raquel Sarna - this CT done in dec 2021 was followup of nodule when I last saw her a year ago in Jan 2021. That nodule is not there but it appears since Oct 2021thereis fracture around her left shoulder. Please have her talk to PCP and also c/w Jan 2021 vist -> pls giver her routine followup with APP  xxxx IMPRESSION: 1. Notable new osteoid deposition around a subacute displaced fracture the left acromion, also with fracture of the supraspinous portion of the left scapula. 2. The clustered right upper lobe nodularity shown on the prior exam has resolved. Scattered small chronic nodules and other inflammatory findings with a generally benign appearance. 3. Coronary, aortic arch, and branch vessel atherosclerotic vascular disease. 4. Small to moderate-sized type 1 hiatal hernia. 5. Mucous plugging in a left lower lobe bronchus. 6. Aortic atherosclerosis.  Aortic Atherosclerosis (ICD10-I70.0).   Electronically Signed By: Van Clines M.D. On: 04/07/2020 14:21

## 2020-05-26 ENCOUNTER — Telehealth: Payer: Self-pay | Admitting: Internal Medicine

## 2020-05-26 NOTE — Telephone Encounter (Signed)
Spoke with pt . She wanted to clarify which shoulder showed a fracture on her recent CT Scan. I advised pt that it is the left shoulder. Pt verbalized understanding. Nothing further needed at this time.

## 2020-06-03 ENCOUNTER — Ambulatory Visit: Payer: PPO | Admitting: Primary Care

## 2020-06-03 ENCOUNTER — Encounter: Payer: Self-pay | Admitting: Primary Care

## 2020-06-03 ENCOUNTER — Other Ambulatory Visit: Payer: Self-pay

## 2020-06-03 VITALS — BP 122/68 | HR 72 | Temp 97.3°F | Ht 63.0 in | Wt 146.2 lb

## 2020-06-03 DIAGNOSIS — J432 Centrilobular emphysema: Secondary | ICD-10-CM | POA: Diagnosis not present

## 2020-06-03 DIAGNOSIS — R918 Other nonspecific abnormal finding of lung field: Secondary | ICD-10-CM | POA: Diagnosis not present

## 2020-06-03 NOTE — Assessment & Plan Note (Addendum)
-   Well controlled on Trelegy 100 - Recommend patient take mucinex for chest congestion and start using flutter valve - Continue to strongly encourage smoking cessation, recommend patient taper amount and then pick quit date - FU in 1 year with Dr. Chase Caller

## 2020-06-03 NOTE — Patient Instructions (Addendum)
Recommendations: - Continue Trelegy 1 puff daily in morning - Use albuterol 2 puffs every 4-6 hours as needed for shortness of breath/wheezing - Take mucinex twice a day for chest congestion   Orders: - Flutter valve  - Repeat CT chest wo contrast in 1 year   Follow-up - 1 year with Dr. Chase Caller

## 2020-06-03 NOTE — Progress Notes (Signed)
@Patient  ID: Joann Barnes, female    DOB: 05-26-1940, 80 y.o.   MRN: [id number]  Chief Complaint  Patient presents with  . Follow-up    No complaints    Referring provider: Merrilee Seashore, MD  HPI: 80 year old female, current everyday smoker.  Past medical history significant for hypertension, COPD with reversibility, GERD, hypothyroidism, hyperlipidemia, cardiac murmur.  Patient of Dr. Chase Caller, last seen in office in January 2021.    06/03/2020  Patient presents today for 1 year follow-up emphysema/pulmoanry nodule.  Breathing has been well controlled on Trelegy Ellipta 100. She has an occasional np cough, she finds it difficult to get mucus up. She has mucinex at home but has not started taking it. She uses albuterol once a day. She is smoking more recently. Repeat CT chest in December 2021 which showed resolution of clustered right upper lobe nodularity, new osteoid deposit around subacute displaced fracture left acromion and fracture of portion of left scapula. She has been less active since falling in September 2021. She sustained a left shoulder fracture but her right shoulder is the one that has been bothering her. She has an apt with Dr. Lorin Mercy tomorrow.    Allergies  Allergen Reactions  . Bactrim [Sulfamethoxazole-Trimethoprim] Shortness Of Breath  . Nicotine Anaphylaxis and Other (See Comments)    Rash and itch with patch locally  Chewing the gum closed her throat  . Codeine Itching and Nausea And Vomiting  . Lipitor [Atorvastatin] Other (See Comments)    Muscle aches  . Benadryl [Diphenhydramine Hcl] Other (See Comments)    Drowsiness   . Chantix [Varenicline Tartrate] Hives, Itching, Nausea And Vomiting and Rash  . Chantix [Varenicline] Hives, Itching, Nausea And Vomiting and Rash  . Wellbutrin [Bupropion] Rash    Slight rash around ankles  . Zolpidem Tartrate Other (See Comments)    Bad dreams    Immunization History  Administered Date(s) Administered  .  Fluad Quad(high Dose 65+) 12/13/2019  . Influenza Whole 01/31/2010, 01/08/2011  . Influenza, High Dose Seasonal PF 05/03/2016, 02/02/2018  . Influenza-Unspecified 12/06/2015  . PFIZER(Purple Top)SARS-COV-2 Vaccination 06/16/2019, 07/07/2019  . Pneumococcal Conjugate-13 01/24/2018  . Unspecified SARS-COV-2 Vaccination 06/16/2019, 07/07/2019  . Zoster Recombinat (Shingrix) 02/14/2018, 04/15/2018    Past Medical History:  Diagnosis Date  . Aortic stenosis    Mild AS 02/2016 echo  . Cancer ([facility])    skin  . COPD (chronic obstructive pulmonary disease) ([facility])   . Dyspnea   . GERD (gastroesophageal reflux disease)   . Hematuria   . Hernia, hiatal   . HTN (hypertension)   . Hyperlipidemia   . Hypothyroid   . Migraine   . OA (osteoarthritis)   . OP (osteoporosis)   . Other and unspecified angina pectoris    02/2016 - had non-ischemic stress test  . Pneumonia   . Spondylolisthesis of lumbar region     Tobacco History: Social History   Tobacco Use  Smoking Status Current Every Day Smoker  . Packs/day: 1.00  . Years: 45.00  . Pack years: 45.00  . Types: Cigarettes  Smokeless Tobacco Never Used  Tobacco Comment   1 pack smoked daily ARJ 06/03/20   Ready to quit: Not Answered Counseling given: Not Answered Comment: 1 pack smoked daily ARJ 06/03/20   Outpatient Medications Prior to Visit  Medication Sig Dispense Refill  . albuterol (ACCUNEB) 1.25 MG/3ML nebulizer solution Take 1 ampule by nebulization daily as needed for wheezing or shortness of breath.     Marland Kitchen  amLODipine (NORVASC) 5 MG tablet Take 5 mg by mouth daily.    Marland Kitchen aspirin EC 81 MG EC tablet Take 1 tablet (81 mg total) by mouth daily.    Marland Kitchen azithromycin (ZITHROMAX) 250 MG tablet Take 1 tablet (250 mg total) by mouth daily. 3 each 0  . calcium carbonate (TUMS - DOSED IN MG ELEMENTAL CALCIUM) 500 MG chewable tablet Chew 2 tablets by mouth daily as needed for indigestion or heartburn.     . Cholecalciferol (VITAMIN D3) 2000  units TABS Take 2,000 Units by mouth daily.    Marland Kitchen escitalopram (LEXAPRO) 20 MG tablet Take 20 mg by mouth daily.     . Fluticasone-Umeclidin-Vilant 100-62.5-25 MCG/INH AEPB Inhale 1 puff into the lungs daily.    Marland Kitchen ibandronate (BONIVA) 3 MG/3ML SOLN injection Inject 3 mLs (3 mg total) into the vein every 3 (three) months.    . levothyroxine (SYNTHROID) 25 MCG tablet Take 25 mcg by mouth daily.    Marland Kitchen olmesartan (BENICAR) 5 MG tablet Take 5 mg by mouth daily.    Marland Kitchen omeprazole (PRILOSEC) 40 MG capsule Take 40 mg by mouth daily before breakfast.     . oxyCODONE (OXY IR/ROXICODONE) 5 MG immediate release tablet Take 0.5 tablets (2.5 mg total) by mouth every 6 (six) hours as needed for breakthrough pain. 12 tablet 0  . pravastatin (PRAVACHOL) 80 MG tablet Take 80 mg by mouth at bedtime.     Marland Kitchen PROAIR HFA 108 (90 Base) MCG/ACT inhaler Inhale 2 puffs into the lungs 2 (two) times daily.     . vitamin C (ASCORBIC ACID) 250 MG tablet Take 250 mg by mouth daily.    Marland Kitchen rOPINIRole (REQUIP) 0.25 MG tablet Take 1 tablet (0.25 mg total) by mouth 2 (two) times daily as needed for up to 20 days (leg discomfort). 12 tablet 0   No facility-administered medications prior to visit.    Review of Systems  Review of Systems  Constitutional: Negative.   HENT: Negative.   Respiratory: Positive for cough and shortness of breath.   Cardiovascular: Negative.   Musculoskeletal:       Right shoulder pain    Physical Exam  BP 122/68 (BP Location: Left Arm, Cuff Size: Normal)   Pulse 72   Temp (!) 97.3 F (36.3 C)   Ht 5\' 3"  (1.6 m)   Wt 146 lb 3.2 oz (66.3 kg)   SpO2 94%   BMI 25.90 kg/m  Physical Exam Constitutional:      Appearance: Normal appearance.  HENT:     Head: Normocephalic and atraumatic.  Cardiovascular:     Rate and Rhythm: Normal rate and regular rhythm.     Heart sounds: Murmur heard.    Pulmonary:     Effort: Pulmonary effort is normal.     Breath sounds: Normal breath sounds.   Musculoskeletal:     Comments: In [facility]  Neurological:     General: No focal deficit present.     Mental Status: She is alert and oriented to person, place, and time. Mental status is at baseline.  Psychiatric:        Mood and Affect: Mood normal.        Behavior: Behavior normal.        Thought Content: Thought content normal.        Judgment: Judgment normal.      Lab Results:  CBC    Component Value Date/Time   WBC 11.1 (H) 02/20/2020 0743   RBC 3.54 (  L) 02/20/2020 0743   HGB 9.1 (L) 02/20/2020 0743   HCT 29.1 (L) 02/20/2020 0743   PLT 515 (H) 02/20/2020 0743   MCV 82.2 02/20/2020 0743   MCH 25.7 (L) 02/20/2020 0743   MCHC 31.3 02/20/2020 0743   RDW 16.8 (H) 02/20/2020 0743   LYMPHSABS 1.4 02/17/2020 1611   MONOABS 1.3 (H) 02/17/2020 1611   EOSABS 0.2 02/17/2020 1611   BASOSABS 0.1 02/17/2020 1611    BMET    Component Value Date/Time   NA 139 02/20/2020 0743   K 3.8 02/20/2020 0743   CL 106 02/20/2020 0743   CO2 21 (L) 02/20/2020 0743   GLUCOSE 69 (L) 02/20/2020 0743   BUN 15 02/20/2020 0743   CREATININE 1.06 (H) 02/20/2020 0743   CALCIUM 8.1 (L) 02/20/2020 0743   GFRNONAA 50 (L) 02/20/2020 0743   GFRAA 49 (L) 12/15/2017 1106    BNP    Component Value Date/Time   BNP 102.4 (H) 12/15/2017 1246    ProBNP    Component Value Date/Time   PROBNP 134.7 (H) 04/22/2014 1814    Imaging: No results found.   Assessment & Plan:   COPD with +BD  - Well controlled on Trelegy 100 - Recommend patient take mucinex for chest congestion and start using flutter valve - Continue to strongly encourage smoking cessation, recommend patient taper amount and then pick quit date - FU in 1 year with Dr. Chase Caller   Abnormal findings on diagnostic imaging of lung - Most recent CT chest in December 2021 showed resolution of clustered nodularity to RUL  - As patient continues to be a heavy active smoker, would repeat imaging in 1 year to monitor smaller nodules     Martyn Ehrich, NP 06/03/2020

## 2020-06-03 NOTE — Assessment & Plan Note (Addendum)
-   Most recent CT chest in December 2021 showed resolution of clustered nodularity to RUL  - As patient continues to be a heavy active smoker, would repeat imaging in 1 year to monitor smaller nodules

## 2020-06-04 ENCOUNTER — Encounter: Payer: Self-pay | Admitting: Orthopaedic Surgery

## 2020-06-04 ENCOUNTER — Ambulatory Visit: Payer: PPO | Admitting: Orthopaedic Surgery

## 2020-06-04 DIAGNOSIS — S42125S Nondisplaced fracture of acromial process, left shoulder, sequela: Secondary | ICD-10-CM

## 2020-06-04 NOTE — Progress Notes (Signed)
Office Visit Note   Patient: Joann Barnes           Date of Birth: 1940/05/31           MRN: [id number] Visit Date: 06/04/2020              Requested by: Merrilee Seashore, [address] [place],   [address] PCP: Merrilee Seashore, MD   Assessment & Plan: Visit Diagnoses:  1. Closed nondisplaced fracture of acromial process of left scapula, sequela     Plan: Would recommend patient continue to ambulate using a walker for fall prevention and then when she gets tired sit rest and repeat.  She is making progress with therapy.  We reviewed CT scan showing acromion fracture present after fall and no additional imaging studies are needed at this point and she has good range of motion of her left shoulder.  Follow-Up Instructions: Return if symptoms worsen or fail to improve.   Orders:  No orders of the defined types were placed in this encounter.  No orders of the defined types were placed in this encounter.     Procedures: No procedures performed   Clinical Data: No additional findings.   Subjective: Chief Complaint  Patient presents with   Left Shoulder - Pain   Right Shoulder - Pain   Right Leg - Pain   Right Hip - Pain    HPI 80 year old female fell in September hit her head had CT scan of her head and lungs and was noted to have an acromial fracture which was healing noted on scan 04/07/2020.  Patient states left shoulder is not bothering her.  She is having increased discomfort in her right shoulder with outstretch reaching and attempted overhead activity.  She is also had some discomfort laterally over her right trochanter.  Patient is currently doing therapy and is noting slow gradual progression in her ability to ambulate.  Patient is still smoking.  Review of Systems you systems update new since last visit is for mucus plugging in her lung, fall with left acromial fracture.  MRI of the brain with evidence of mild cerebral atrophy  chronic small vessel ischemic disease.  All other systems noncontributory to HPI.   Objective: Vital Signs: BP (!) 146/64    Pulse 79    Ht 5\' 3"  (1.6 m)    Wt 146 lb (66.2 kg)    BMI 25.86 kg/m   Physical Exam Constitutional:      Appearance: She is well-developed.  HENT:     Head: Normocephalic.     Right Ear: External ear normal.     Left Ear: External ear normal.  Eyes:     Pupils: Pupils are equal, round, and reactive to light.  Neck:     Thyroid: No thyromegaly.     Trachea: No tracheal deviation.  Cardiovascular:     Rate and Rhythm: Normal rate.  Pulmonary:     Effort: Pulmonary effort is normal.  Abdominal:     Palpations: Abdomen is soft.  Skin:    General: Skin is warm and dry.  Neurological:     Mental Status: She is alert and oriented to person, place, and time.  Psychiatric:        Mood and Affect: Mood and affect normal.        Behavior: Behavior normal.     Ortho Exam patient is mild to moderate tenderness over the right greater trochanter.  Negative logroll to the hips  knee reach full extension.  Anterior tib EHL is intact.  Specialty Comments:  No specialty comments available.  Imaging: No results found.   PMFS History: Patient Active Problem List   Diagnosis Date Noted   Acromial fracture 06/05/2020   Encephalopathy acute 02/17/2020   Healthcare maintenance 04/09/2019   Abnormal findings on diagnostic imaging of lung 04/08/2019   History of lumbar spinal fusion 03/14/2019   Trochanteric bursitis, right hip 03/14/2019   Spondylolisthesis of lumbar region 05/23/2017   Acute kidney injury superimposed on chronic kidney disease ([place]) 02/04/2016   Chest pain with moderate risk of acute coronary syndrome 02/04/2016   Tobacco use 02/04/2016   Hypokalemia 02/04/2016   Murmur, cardiac-suspect this is AS 02/04/2016   Hyperlipidemia    GERD (gastroesophageal reflux disease)    HTN (hypertension)    Hypothyroid    Chest pain     COPD with +BD  11/16/2010   Dyspnea 10/22/2010   Smoking 10/22/2010   Past Medical History:  Diagnosis Date   Aortic stenosis    Mild AS 02/2016 echo   Cancer ([facility])    skin   COPD (chronic obstructive pulmonary disease) ([facility])    Dyspnea    GERD (gastroesophageal reflux disease)    Hematuria    Hernia, hiatal    HTN (hypertension)    Hyperlipidemia    Hypothyroid    Migraine    OA (osteoarthritis)    OP (osteoporosis)    Other and unspecified angina pectoris    02/2016 - had non-ischemic stress test   Pneumonia    Spondylolisthesis of lumbar region     Family History  Problem Relation Age of Onset   Heart disease Mother    Heart disease Father    Heart disease Brother    Cancer Sister        breast   Cancer Sister        lung    Past Surgical History:  Procedure Laterality Date   APPENDECTOMY     CATARACT EXTRACTION, BILATERAL     CERVICAL DISC ARTHROPLASTY     x 2   COLONOSCOPY     ESOPHAGOGASTRODUODENOSCOPY  07/19/00   HAND SURGERY Right 2014   LAMINECTOMY  05/23/2017   L4-5, L5-S1   Social History   Occupational History   Occupation: retired    Fish farm manager: RETIRED  Tobacco Use   Smoking status: Current Every Day Smoker    Packs/day: 1.00    Years: 45.00    Pack years: 45.00    Types: Cigarettes   Smokeless tobacco: Never Used   Tobacco comment: 1 pack smoked daily ARJ 06/03/20  Vaping Use   Vaping Use: Never used  Substance and Sexual Activity   Alcohol use: No   Drug use: No   Sexual activity: Not on file

## 2020-06-05 ENCOUNTER — Telehealth: Payer: Self-pay | Admitting: Primary Care

## 2020-06-05 DIAGNOSIS — S42123A Displaced fracture of acromial process, unspecified shoulder, initial encounter for closed fracture: Secondary | ICD-10-CM | POA: Insufficient documentation

## 2020-06-05 NOTE — Telephone Encounter (Signed)
Spoke with patient and informed her that the flutter valve's are on back order. Our [facility] team has contacted [place] with [employer] to see if they have an update. Otherwise, patient could try and obtain one on [place]. Nothing further needed.

## 2020-06-10 DIAGNOSIS — M48062 Spinal stenosis, lumbar region with neurogenic claudication: Secondary | ICD-10-CM | POA: Diagnosis not present

## 2020-06-11 DIAGNOSIS — J432 Centrilobular emphysema: Secondary | ICD-10-CM | POA: Diagnosis not present

## 2020-06-11 DIAGNOSIS — E782 Mixed hyperlipidemia: Secondary | ICD-10-CM | POA: Diagnosis not present

## 2020-06-11 DIAGNOSIS — R600 Localized edema: Secondary | ICD-10-CM | POA: Diagnosis not present

## 2020-06-11 DIAGNOSIS — N1832 Chronic kidney disease, stage 3b: Secondary | ICD-10-CM | POA: Diagnosis not present

## 2020-06-16 DIAGNOSIS — M810 Age-related osteoporosis without current pathological fracture: Secondary | ICD-10-CM | POA: Diagnosis not present

## 2020-06-19 ENCOUNTER — Other Ambulatory Visit: Payer: Self-pay

## 2020-06-19 ENCOUNTER — Ambulatory Visit: Payer: PPO

## 2020-06-19 DIAGNOSIS — I6523 Occlusion and stenosis of bilateral carotid arteries: Secondary | ICD-10-CM

## 2020-06-22 ENCOUNTER — Other Ambulatory Visit: Payer: Self-pay | Admitting: Cardiology

## 2020-06-22 DIAGNOSIS — I6523 Occlusion and stenosis of bilateral carotid arteries: Secondary | ICD-10-CM

## 2020-06-25 NOTE — Progress Notes (Signed)
Called patient, Joann Barnes, Joann Barnes

## 2020-06-25 NOTE — Progress Notes (Signed)
Patient aware and called transferred to front desk staff for scheduling.

## 2020-07-14 ENCOUNTER — Other Ambulatory Visit: Payer: Self-pay

## 2020-07-14 ENCOUNTER — Encounter: Payer: Self-pay | Admitting: Cardiology

## 2020-07-14 ENCOUNTER — Ambulatory Visit: Payer: PPO | Admitting: Cardiology

## 2020-07-14 VITALS — BP 135/67 | HR 50 | Temp 98.2°F | Resp 16 | Ht 63.0 in | Wt 143.8 lb

## 2020-07-14 DIAGNOSIS — Z720 Tobacco use: Secondary | ICD-10-CM

## 2020-07-14 DIAGNOSIS — E7800 Pure hypercholesterolemia, unspecified: Secondary | ICD-10-CM

## 2020-07-14 DIAGNOSIS — I2510 Atherosclerotic heart disease of native coronary artery without angina pectoris: Secondary | ICD-10-CM | POA: Diagnosis not present

## 2020-07-14 DIAGNOSIS — R0609 Other forms of dyspnea: Secondary | ICD-10-CM | POA: Diagnosis not present

## 2020-07-14 DIAGNOSIS — I6523 Occlusion and stenosis of bilateral carotid arteries: Secondary | ICD-10-CM

## 2020-07-14 DIAGNOSIS — R0600 Dyspnea, unspecified: Secondary | ICD-10-CM

## 2020-07-14 MED ORDER — ROSUVASTATIN CALCIUM 20 MG PO TABS
20.0000 mg | ORAL_TABLET | Freq: Every day | ORAL | 2 refills | Status: DC
Start: 2020-07-14 — End: 2020-08-07

## 2020-07-14 NOTE — Progress Notes (Signed)
Primary Physician/Referring:  Merrilee Seashore, MD  Patient ID: Joann Barnes, female    DOB: November 10, 1940, 80 y.o.   MRN: [id number]  Chief Complaint  Patient presents with  . orthostatics  . Follow-up dizziness and carotid stenosis    6 month   HPI:    Joann Barnes  is a 80 y.o. Caucasian female with hypertension, hyperlipidemia, COPD with centrilobular emphysema, hypothyroidism, migraines, GERD, coronary calcification noted on the CT scan in December 2019,  presents here for follow-up of dyspnea and asymptomatic carotid stenosis.   I had seen her almost 2 years ago.  She was admitted in September 2: Hospital after mental status changes, felt to be due to probably having taken Percocet overdose accidentally as she had a fall prior to that and it hurt her right hip.  She has no specific complaints today, dyspnea is chronic, has occasional left leg edema.  Denies chest pain, dizziness or syncope.  Past Medical History:  Diagnosis Date  . Aortic stenosis    Mild AS 02/2016 echo  . Cancer ([place])    skin  . COPD (chronic obstructive pulmonary disease) ([place])   . Dyspnea   . GERD (gastroesophageal reflux disease)   . Hematuria   . Hernia, hiatal   . HTN (hypertension)   . Hyperlipidemia   . Hypothyroid   . Migraine   . OA (osteoarthritis)   . OP (osteoporosis)   . Other and unspecified angina pectoris    02/2016 - had non-ischemic stress test  . Pneumonia   . Spondylolisthesis of lumbar region    Past Surgical History:  Procedure Laterality Date  . APPENDECTOMY    . CATARACT EXTRACTION, BILATERAL    . CERVICAL DISC ARTHROPLASTY     x 2  . COLONOSCOPY    . ESOPHAGOGASTRODUODENOSCOPY  07/19/00  . HAND SURGERY Right 2014  . LAMINECTOMY  05/23/2017   L4-5, L5-S1   Social History   Tobacco Use  . Smoking status: Current Every Day Smoker    Packs/day: 1.00    Years: 45.00    Pack years: 45.00    Types: Cigarettes  . Smokeless tobacco: Never Used  . Tobacco comment:  1 pack smoked daily [facility] 06/03/20  Substance Use Topics  . Alcohol use: No  Marital Status: Widowed    ROS  Review of Systems  Constitutional: Negative for chills, decreased appetite, malaise/fatigue and weight gain.  Cardiovascular: Positive for dyspnea on exertion. Negative for leg swelling and syncope.  Respiratory: Positive for cough and shortness of breath (chronic ).   Endocrine: Negative for cold intolerance.  Hematologic/Lymphatic: Does not bruise/bleed easily.  Musculoskeletal: Positive for joint pain. Negative for joint swelling.  Gastrointestinal: Negative for abdominal pain, anorexia, change in bowel habit, hematochezia and melena.  Neurological: Negative for headaches and light-headedness.  Psychiatric/Behavioral: Negative for depression and substance abuse.  All other systems reviewed and are negative.  Objective  Blood pressure 135/67, pulse (!) 50, temperature 98.2 F (36.8 C), temperature source Temporal, resp. rate 16, height 5' 3"  (1.6 m), weight 143 lb 12.8 oz (65.2 kg), SpO2 93 %. Body mass index is 25.47 kg/m. Vitals with BMI 07/14/2020 06/04/2020 06/03/2020  Height 5' 3"  5' 3"  5' 3"   Weight 143 lbs 13 oz 146 lbs 146 lbs 3 oz  BMI 25.48 00.86 76.1  Systolic 950 932 671  Diastolic 67 64 68  Pulse 50 79 72    Physical Exam Constitutional:      General: She is  not in acute distress.    Appearance: She is well-developed.  HENT:     Head: Atraumatic.  Eyes:     Conjunctiva/sclera: Conjunctivae normal.  Neck:     Thyroid: No thyromegaly.     Vascular: No JVD.  Cardiovascular:     Rate and Rhythm: Normal rate and regular rhythm.     Pulses: Intact distal pulses.          Carotid pulses are 2+ on the right side with bruit and 2+ on the left side with bruit.      Femoral pulses are 2+ on the right side and 2+ on the left side.      Popliteal pulses are 2+ on the right side and 2+ on the left side.       Dorsalis pedis pulses are 2+ on the right side and 2+ on the  left side.       Posterior tibial pulses are 1+ on the right side and 1+ on the left side.     Heart sounds: S1 normal and S2 normal. Murmur heard.   Midsystolic murmur is present with a grade of 2/6 at the upper right sternal border and apex. No gallop.      Comments: No edema.  Right carotid bruit is very prominent.  Otherwise vascular examination is normal except for PT which is slightly reduced bilaterally. Pulmonary:     Effort: Pulmonary effort is normal.     Breath sounds: Wheezing (Bilateral scattered) present.  Abdominal:     General: Bowel sounds are normal.     Palpations: Abdomen is soft.  Musculoskeletal:        General: Normal range of motion.     Cervical back: Neck supple.  Skin:    General: Skin is warm and dry.  Neurological:     Mental Status: She is alert.    Radiology: No results found.  Laboratory examination:   Recent Labs    02/18/20 0516 02/19/20 0615 02/20/20 0743  NA 134* 135 139  K 3.8 3.9 3.8  CL 101 102 106  CO2 23 24 21*  GLUCOSE 93 88 69*  BUN 12 16 15   CREATININE 0.97 0.99 1.06*  CALCIUM 8.4* 8.5* 8.1*  GFRNONAA 55* 54* 50*   CMP Latest Ref Rng & Units 02/20/2020 02/19/2020 02/18/2020  Glucose 70 - 99 mg/dL 69(L) 88 93  BUN 8 - 23 mg/dL 15 16 12   Creatinine 0.44 - 1.00 mg/dL 1.06(H) 0.99 0.97  Sodium 135 - 145 mmol/L 139 135 134(L)  Potassium 3.5 - 5.1 mmol/L 3.8 3.9 3.8  Chloride 98 - 111 mmol/L 106 102 101  CO2 22 - 32 mmol/L 21(L) 24 23  Calcium 8.9 - 10.3 mg/dL 8.1(L) 8.5(L) 8.4(L)  Total Protein 6.5 - 8.1 g/dL - 5.5(L) 5.4(L)  Total Bilirubin 0.3 - 1.2 mg/dL - 0.8 0.9  Alkaline Phos 38 - 126 U/L - 97 95  AST 15 - 41 U/L - 15 14(L)  ALT 0 - 44 U/L - 19 19   CBC Latest Ref Rng & Units 02/20/2020 02/19/2020 02/18/2020  WBC 4.0 - 10.5 K/uL 11.1(H) 12.3(H) 14.3(H)  Hemoglobin 12.0 - 15.0 g/dL 9.1(L) 9.7(L) 10.2(L)  Hematocrit 36.0 - 46.0 % 29.1(L) 29.6(L) 32.0(L)  Platelets 150 - 400 K/uL 515(H) 482(H) 443(H)    Recent  Labs    02/17/20 2048  TSH 1.722    External labs:   Labs 01/22/2020:  Hb 10.1/HCT 31.3, platelets 376, normal indicis.  WBC 8.0.  Serum glucose 96 mg, BUN 16, creatinine 1.46, EGFR 34 mL, potassium 4.7, CMP otherwise normal.  A1c 5.7%.  TSH normal.  Total cholesterol 192, triglycerides 107, HDL 45, LDL 128. Medications   Current Outpatient Medications on File Prior to Visit  Medication Sig Dispense Refill  . albuterol (ACCUNEB) 1.25 MG/3ML nebulizer solution Take 1 ampule by nebulization daily as needed for wheezing or shortness of breath.     Marland Kitchen amLODipine (NORVASC) 5 MG tablet Take 5 mg by mouth daily.    Marland Kitchen aspirin EC 81 MG EC tablet Take 1 tablet (81 mg total) by mouth daily.    . calcium carbonate (TUMS - DOSED IN MG ELEMENTAL CALCIUM) 500 MG chewable tablet Chew 2 tablets by mouth daily as needed for indigestion or heartburn.     . Cholecalciferol (VITAMIN D3) 2000 units TABS Take 2,000 Units by mouth daily.    Marland Kitchen escitalopram (LEXAPRO) 20 MG tablet Take 20 mg by mouth daily.     . Fluticasone-Umeclidin-Vilant 100-62.5-25 MCG/INH AEPB Inhale 1 puff into the lungs daily.    Marland Kitchen levothyroxine (SYNTHROID) 25 MCG tablet Take 25 mcg by mouth daily.    Marland Kitchen olmesartan (BENICAR) 5 MG tablet Take 5 mg by mouth daily.    Marland Kitchen omeprazole (PRILOSEC) 40 MG capsule Take 40 mg by mouth daily before breakfast.     . PROAIR HFA 108 (90 Base) MCG/ACT inhaler Inhale 2 puffs into the lungs 2 (two) times daily.      No current facility-administered medications on file prior to visit.    Cardiac Studies:   CT scan of chest 04/03/2018:  1. No acute findings. Interval clearing of mucoid impaction and ground-glass in the right upper and right lower lobes. 2. Scattered pulmonary nodules measure 4 mm or less size, stable. 3. Aortic atherosclerosis. Three-vessel coronary artery calcification.   Lexiscan myoview stress test 04/24/2018: 1. Lexiscan stress test was performed. Exercise capacity was not  assessed. Stress symptoms included dyspnea, flushing, dizziness. Resting blood pressure 184/80 mmHg, peak effect blood pressure 140/70 mmHg. The resting and stress electrocardiogram demonstrated normal sinus rhythm, normal resting conduction, no resting arrhythmias, normal rest repolarization, and poor R wave progression. Stress EKG is non diagnostic for ischemia as it is a pharmacologic stress. 2. The overall quality of the study is excellent. There is no evidence of abnormal lung activity. Stress and rest SPECT images demonstrate homogeneous tracer distribution throughout the myocardium. Gated SPECT imaging reveals normal myocardial thickening and wall motion. The left ventricular ejection fraction was normal (68%). 3. Low risk study.  Echocardiogram 08/05/2017: Normal LV systolic function, EF 96-78%, normal diastolic function. Mild aortic stenosis, moderate aortic regurgitation. Aortic valve area 1.11 cm.  Mild MR, mild TR, PA pressure 31 mmHg.  Moderate left atrial enlargement, a PFO cannot be excluded.  Abdominal aortic duplex 05/17/2018: The maximum aorta diameter is 2.63 cm (dist). Diffuse plaque observed in the distal aorta. Peak systolic velocities in the left internal iliac artery are mildly increased to 175.54 cm/s. suggestive of <50% stenosis. No AAA noted.  Carotid artery duplex 06/19/2020: Stenosis in the right internal carotid artery (16-49%). Stenosis in the right external carotid artery (<50%). Stenosis in the left internal carotid artery (50-69%). Stenosis in the left external carotid artery (<50%). Antegrade right vertebral artery flow. Antegrade left vertebral artery flow. Compared to the study done on 06/18/2019, there is progression of stenosis severity in bilateral carotid arteries.  Previously <50% on the right and minimal on the left. Follow up in  six months is appropriate if clinically indicated.  EKG:    EKG 07/14/2020: Normal sinus rhythm at rate of 77 bpm, left  atrial enlargement, left axis deviation, left anterior fascicular block.  No evidence of ischemia.  Single PAC.  No significant change from 04/19/2018.   Assessment     ICD-10-CM   1. Dyspnea on exertion  R06.00 EKG 12-Lead  2. Asymptomatic bilateral carotid artery stenosis  I65.23   3. Coronary artery calcification seen on CAT scan  I25.10   4. Tobacco use  Z72.0   5. Hypercholesteremia  E78.00 rosuvastatin (CRESTOR) 20 MG tablet    Lipid Panel With LDL/HDL Ratio   Meds ordered this encounter  Medications  . rosuvastatin (CRESTOR) 20 MG tablet    Sig: Take 1 tablet (20 mg total) by mouth daily.    Dispense:  30 tablet    Refill:  2    Discontinue pravastatin   Medications Discontinued During This Encounter  Medication Reason  . ibandronate (BONIVA) 3 MG/3ML SOLN injection Error  . oxyCODONE (OXY IR/ROXICODONE) 5 MG immediate release tablet Error  . vitamin C (ASCORBIC ACID) 250 MG tablet Error  . rOPINIRole (REQUIP) 0.25 MG tablet Error  . azithromycin (ZITHROMAX) 250 MG tablet Error  . pravastatin (PRAVACHOL) 80 MG tablet Change in therapy   Orders Placed This Encounter  Procedures  . Lipid Panel With LDL/HDL Ratio  . EKG 12-Lead    Recommendations:   Joann Barnes  is a 80 y.o. Caucasian female with hypertension, hyperlipidemia, COPD with centrilobular emphysema, hypothyroidism, migraines, GERD, coronary calcification noted on the CT scan in December 2019,  presents here for follow-up of dyspnea and asymptomatic carotid stenosis.   I had seen her almost 2 years ago.  She was admitted in September 2: Hospital after mental status changes, felt to be due to probably having taken Percocet overdose accidentally as she had a fall prior to that and it hurt her right hip.  I discussed the findings of the carotid artery duplex with her, she will need continued surveillance.  Her lipids reviewed, discontinue pravastatin and switch her to Crestor 20 mg daily.  Previously she did not  tolerate Lipitor.  I will check lipids in 6 to 8 weeks and I would like to see her back in 2 to 3 months for follow-up.  Otherwise blood pressure is well controlled, she is on aspirin 81 mg daily for secondary prophylaxis in view of coronary calcification and carotid disease, smoking cessation was extensively discussed with the patient.  Unfortunately she has got severe allergies to nicotine patch where she breaks out into hives and could not tolerate Zyban or Wellbutrin.  Blood pressures well controlled.  External labs reviewed.  Renal function has remained stable.   Adrian Prows, MD, [facility] 07/14/2020, 10:49 AM Office: [phone] Pager: [phone]

## 2020-07-17 DIAGNOSIS — H524 Presbyopia: Secondary | ICD-10-CM | POA: Diagnosis not present

## 2020-07-17 DIAGNOSIS — H40013 Open angle with borderline findings, low risk, bilateral: Secondary | ICD-10-CM | POA: Diagnosis not present

## 2020-07-17 DIAGNOSIS — H43393 Other vitreous opacities, bilateral: Secondary | ICD-10-CM | POA: Diagnosis not present

## 2020-07-17 DIAGNOSIS — Z961 Presence of intraocular lens: Secondary | ICD-10-CM | POA: Diagnosis not present

## 2020-07-18 DIAGNOSIS — M545 Low back pain, unspecified: Secondary | ICD-10-CM | POA: Diagnosis not present

## 2020-07-21 ENCOUNTER — Other Ambulatory Visit: Payer: Self-pay | Admitting: Neurosurgery

## 2020-07-21 DIAGNOSIS — M545 Low back pain, unspecified: Secondary | ICD-10-CM

## 2020-07-22 DIAGNOSIS — E782 Mixed hyperlipidemia: Secondary | ICD-10-CM | POA: Diagnosis not present

## 2020-07-22 DIAGNOSIS — E039 Hypothyroidism, unspecified: Secondary | ICD-10-CM | POA: Diagnosis not present

## 2020-07-22 DIAGNOSIS — J449 Chronic obstructive pulmonary disease, unspecified: Secondary | ICD-10-CM | POA: Diagnosis not present

## 2020-07-29 DIAGNOSIS — M150 Primary generalized (osteo)arthritis: Secondary | ICD-10-CM | POA: Diagnosis not present

## 2020-07-29 DIAGNOSIS — I10 Essential (primary) hypertension: Secondary | ICD-10-CM | POA: Diagnosis not present

## 2020-07-29 DIAGNOSIS — N1832 Chronic kidney disease, stage 3b: Secondary | ICD-10-CM | POA: Diagnosis not present

## 2020-07-29 DIAGNOSIS — D508 Other iron deficiency anemias: Secondary | ICD-10-CM | POA: Diagnosis not present

## 2020-07-29 DIAGNOSIS — E039 Hypothyroidism, unspecified: Secondary | ICD-10-CM | POA: Diagnosis not present

## 2020-07-29 DIAGNOSIS — E782 Mixed hyperlipidemia: Secondary | ICD-10-CM | POA: Diagnosis not present

## 2020-07-31 ENCOUNTER — Other Ambulatory Visit: Payer: Self-pay

## 2020-07-31 ENCOUNTER — Ambulatory Visit
Admission: RE | Admit: 2020-07-31 | Discharge: 2020-07-31 | Disposition: A | Discharge status: Home / Self Care | Payer: PPO | Source: Ambulatory Visit | Attending: Neurosurgery | Admitting: Neurosurgery

## 2020-07-31 DIAGNOSIS — M545 Low back pain, unspecified: Secondary | ICD-10-CM | POA: Diagnosis not present

## 2020-08-06 ENCOUNTER — Other Ambulatory Visit: Payer: Self-pay | Admitting: Cardiology

## 2020-08-06 DIAGNOSIS — E7800 Pure hypercholesterolemia, unspecified: Secondary | ICD-10-CM

## 2020-09-10 DIAGNOSIS — M461 Sacroiliitis, not elsewhere classified: Secondary | ICD-10-CM | POA: Diagnosis not present

## 2020-09-12 DIAGNOSIS — E7800 Pure hypercholesterolemia, unspecified: Secondary | ICD-10-CM | POA: Diagnosis not present

## 2020-09-13 LAB — LIPID PANEL WITH LDL/HDL RATIO
Cholesterol, Total: 151 mg/dL (ref 100–199)
HDL: 58 mg/dL (ref >39)
LDL Chol Calc (NIH): 77 mg/dL (ref 0–99)
LDL/HDL Ratio: 1.3 ratio (ref 0.0–3.2)
Triglycerides: 86 mg/dL (ref 0–149)
VLDL Cholesterol Cal: 16 mg/dL (ref 5–40)

## 2020-09-13 NOTE — Progress Notes (Signed)
Cholesterol panel has improved, continue present medicines, will fax to PCP as well

## 2020-09-25 ENCOUNTER — Ambulatory Visit: Payer: PPO | Admitting: Cardiology

## 2020-09-25 ENCOUNTER — Other Ambulatory Visit: Payer: Self-pay

## 2020-09-25 ENCOUNTER — Encounter: Payer: Self-pay | Admitting: Cardiology

## 2020-09-25 VITALS — BP 124/55 | HR 83 | Temp 97.8°F | Resp 16 | Ht 63.0 in | Wt 140.0 lb

## 2020-09-25 DIAGNOSIS — F17210 Nicotine dependence, cigarettes, uncomplicated: Secondary | ICD-10-CM | POA: Diagnosis not present

## 2020-09-25 DIAGNOSIS — Z720 Tobacco use: Secondary | ICD-10-CM

## 2020-09-25 DIAGNOSIS — I6523 Occlusion and stenosis of bilateral carotid arteries: Secondary | ICD-10-CM

## 2020-09-25 DIAGNOSIS — R0609 Other forms of dyspnea: Secondary | ICD-10-CM | POA: Diagnosis not present

## 2020-09-25 DIAGNOSIS — E7800 Pure hypercholesterolemia, unspecified: Secondary | ICD-10-CM

## 2020-09-25 DIAGNOSIS — R0600 Dyspnea, unspecified: Secondary | ICD-10-CM

## 2020-09-25 MED ORDER — NICOTINE 21 MG/24HR TD PT24: 21.0000 mg | MEDICATED_PATCH | Freq: Every day | TRANSDERMAL | 0 refills | Status: DC

## 2020-09-25 NOTE — Progress Notes (Signed)
Primary Physician/Referring:  Joann Seashore, MD  Patient ID: Joann Barnes, female    DOB: [birth year], 80 y.o.   MRN: [id number]  Chief Complaint  Patient presents with  . orthostatics  . Follow-up dizziness and carotid stenosis    6 month   HPI:    Joann Barnes  is a 80 y.o. fairly active and independent Caucasian female patient with hypertension, hyperlipidemia, COPD with centrilobular emphysema, hypothyroidism, migraines, GERD, coronary calcification noted on the CT scan in December 2019,  presents here for follow-up of dyspnea and asymptomatic carotid stenosis.   She is presently doing well but has noticed gradually worsening dyspnea and is now seriously thinking about quitting smoking and has set up her birthday as a quit date, October 01, 2021.  She is presently otherwise doing well, and denies any worsening leg edema, symptoms of claudication, chest pain or palpitations.  Past Medical History:  Diagnosis Date  . Aortic stenosis    Mild AS 02/2016 echo  . Cancer ([place])    skin  . COPD (chronic obstructive pulmonary disease) ([place])   . Dyspnea   . GERD (gastroesophageal reflux disease)   . Hematuria   . Hernia, hiatal   . HTN (hypertension)   . Hyperlipidemia   . Hypothyroid   . Migraine   . OA (osteoarthritis)   . OP (osteoporosis)   . Other and unspecified angina pectoris    02/2016 - had non-ischemic stress test  . Pneumonia   . Spondylolisthesis of lumbar region    Past Surgical History:  Procedure Laterality Date  . APPENDECTOMY    . CATARACT EXTRACTION, BILATERAL    . CERVICAL DISC ARTHROPLASTY     x 2  . COLONOSCOPY    . ESOPHAGOGASTRODUODENOSCOPY  07/19/00  . HAND SURGERY Right 2014  . LAMINECTOMY  05/23/2017   L4-5, L5-S1   Social History   Tobacco Use  . Smoking status: Current Every Day Smoker    Packs/day: 1.00    Years: 45.00    Pack years: 45.00    Types: Cigarettes  . Smokeless tobacco: Never Used  . Tobacco comment: 1 pack smoked daily  [facility] 06/03/20  Substance Use Topics  . Alcohol use: No  Marital Status: Widowed    ROS  Review of Systems  Constitutional: Negative for chills, decreased appetite, malaise/fatigue and weight gain.  Cardiovascular: Positive for dyspnea on exertion. Negative for leg swelling and syncope.  Respiratory: Positive for cough and shortness of breath (chronic ).   Endocrine: Negative for cold intolerance.  Hematologic/Lymphatic: Does not bruise/bleed easily.  Musculoskeletal: Positive for joint pain. Negative for joint swelling.  Gastrointestinal: Negative for abdominal pain, anorexia, change in bowel habit, hematochezia and melena.  Neurological: Negative for headaches and light-headedness.  Psychiatric/Behavioral: Negative for depression and substance abuse.  All other systems reviewed and are negative.  Objective  Blood pressure (!) 124/55, pulse 83, temperature 97.8 F (36.6 C), resp. rate 16, height _0  (1.6 m), weight 140 lb (63.5 kg), SpO2 96 %. Body mass index is 24.8 kg/m. Vitals with BMI 09/25/2020 07/14/2020 06/04/2020  Height _1  _2  _3   Weight 140 lbs 143 lbs 13 oz 146 lbs  BMI 24.81 22.48 25.00  Systolic 370 488 891  Diastolic 55 67 64  Pulse 83 50 79    Physical Exam Constitutional:      General: She is not in acute distress.    Appearance: She is well-developed.  HENT:  Head: Atraumatic.  Eyes:     Conjunctiva/sclera: Conjunctivae normal.  Neck:     Thyroid: No thyromegaly.     Vascular: No JVD.  Cardiovascular:     Rate and Rhythm: Normal rate and regular rhythm.     Pulses: Intact distal pulses.          Carotid pulses are 2+ on the right side with bruit and 2+ on the left side with bruit.      Femoral pulses are 2+ on the right side and 2+ on the left side.      Popliteal pulses are 2+ on the right side and 2+ on the left side.       Dorsalis pedis pulses are 2+ on the right side and 2+ on the left side.       Posterior tibial pulses are 1+ on the right  side and 1+ on the left side.     Heart sounds: S1 normal and S2 normal. Murmur heard.   Midsystolic murmur is present with a grade of 2/6 at the upper right sternal border and apex. No gallop.      Comments: No edema.  Right carotid bruit is very prominent.  Otherwise vascular examination is normal except for PT which is slightly reduced bilaterally. Pulmonary:     Effort: Pulmonary effort is normal.     Breath sounds: Wheezing (Bilateral scattered) present.  Abdominal:     General: Bowel sounds are normal.     Palpations: Abdomen is soft.  Musculoskeletal:        General: Normal range of motion.     Cervical back: Neck supple.  Skin:    General: Skin is warm and dry.  Neurological:     Mental Status: She is alert.    Radiology: No results found.  Laboratory examination:   Recent Labs    02/18/20 0516 02/19/20 0615 02/20/20 0743  NA 134* 135 139  K 3.8 3.9 3.8  CL 101 102 106  CO2 23 24 21*  GLUCOSE 93 88 69*  BUN _0 CREATININE 0.97 0.99 1.06*  CALCIUM 8.4* 8.5* 8.1*  GFRNONAA 55* 54* 50*   CMP Latest Ref Rng & Units 02/20/2020 02/19/2020 02/18/2020  Glucose 70 - 99 mg/dL 69(L) 88 93  BUN 8 - 23 mg/dL _1 Creatinine 0.44 - 1.00 mg/dL 1.06(H) 0.99 0.97  Sodium 135 - 145 mmol/L 139 135 134(L)  Potassium 3.5 - 5.1 mmol/L 3.8 3.9 3.8  Chloride 98 - 111 mmol/L 106 102 101  CO2 22 - 32 mmol/L 21(L) 24 23  Calcium 8.9 - 10.3 mg/dL 8.1(L) 8.5(L) 8.4(L)  Total Protein 6.5 - 8.1 g/dL - 5.5(L) 5.4(L)  Total Bilirubin 0.3 - 1.2 mg/dL - 0.8 0.9  Alkaline Phos 38 - 126 U/L - 97 95  AST 15 - 41 U/L - 15 14(L)  ALT 0 - 44 U/L - 19 19   CBC Latest Ref Rng & Units 02/20/2020 02/19/2020 02/18/2020  WBC 4.0 - 10.5 K/uL 11.1(H) 12.3(H) 14.3(H)  Hemoglobin 12.0 - 15.0 g/dL 9.1(L) 9.7(L) 10.2(L)  Hematocrit 36.0 - 46.0 % 29.1(L) 29.6(L) 32.0(L)  Platelets 150 - 400 K/uL 515(H) 482(H) 443(H)    Recent Labs    02/17/20 2048  TSH 1.722   Lipid Panel Recent Labs     02/17/20 1611 09/12/20 0937  CHOL  --  151  TRIG 166* 86  LDLCALC  --  77  HDL  --  58  External labs:  Labs 03/70/2022:  BUN 15, creatinine 1.34, EGFR 40 mL, potassium could not be tested.  LFTs normal.  Total cholesterol 160, triglycerides 114, HDL 44, LDL 95.  Labs 01/22/2020:  Hb 10.1/HCT 31.3, platelets 376.  Serum glucose 96 mg, BUN 16, creatinine 1.46, EGFR 34 mL, potassium 4.7.  A1c 5.7%.  TSH normal.  Total cholesterol 192, triglycerides 107, HDL 45, LDL 128.  Medications   Current Outpatient Medications on File Prior to Visit  Medication Sig Dispense Refill  . albuterol (ACCUNEB) 1.25 MG/3ML nebulizer solution Take 1 ampule by nebulization daily as needed for wheezing or shortness of breath.     Marland Kitchen amLODipine (NORVASC) 5 MG tablet Take 5 mg by mouth daily.    Marland Kitchen aspirin EC 81 MG EC tablet Take 1 tablet (81 mg total) by mouth daily.    . calcium carbonate (TUMS - DOSED IN MG ELEMENTAL CALCIUM) 500 MG chewable tablet Chew 2 tablets by mouth daily as needed for indigestion or heartburn.     . Cholecalciferol (VITAMIN D3) 2000 units TABS Take 2,000 Units by mouth daily.    . diazepam (VALIUM) 5 MG tablet Take 5 mg by mouth 2 (two) times daily as needed.    Marland Kitchen escitalopram (LEXAPRO) 20 MG tablet Take 20 mg by mouth daily.     . Fluticasone-Umeclidin-Vilant 100-62.5-25 MCG/INH AEPB Inhale 1 puff into the lungs daily.    Marland Kitchen gabapentin (NEURONTIN) 400 MG capsule Take 400 mg by mouth 3 (three) times daily.    Marland Kitchen levothyroxine (SYNTHROID) 25 MCG tablet Take 25 mcg by mouth daily.    Marland Kitchen olmesartan (BENICAR) 5 MG tablet Take 5 mg by mouth daily.    Marland Kitchen omeprazole (PRILOSEC) 40 MG capsule Take 40 mg by mouth daily before breakfast.     . PROAIR HFA 108 (90 Base) MCG/ACT inhaler Inhale 2 puffs into the lungs 2 (two) times daily.     . rosuvastatin (CRESTOR) 20 MG tablet TAKE 1 TABLET BY MOUTH EVERY DAY. STOP PRAVASTATIN 90 tablet 3   No current facility-administered medications  on file prior to visit.    Cardiac Studies:   CT scan of chest 04/03/2018:  1. No acute findings. Interval clearing of mucoid impaction and ground-glass in the right upper and right lower lobes. 2. Scattered pulmonary nodules measure 4 mm or less size, stable. 3. Aortic atherosclerosis. Three-vessel coronary artery calcification.   Lexiscan myoview stress test 04/24/2018: 1. Lexiscan stress test was performed. Exercise capacity was not assessed. Stress symptoms included dyspnea, flushing, dizziness. Resting blood pressure 184/80 mmHg, peak effect blood pressure 140/70 mmHg. The resting and stress electrocardiogram demonstrated normal sinus rhythm, normal resting conduction, no resting arrhythmias, normal rest repolarization, and poor R wave progression. Stress EKG is non diagnostic for ischemia as it is a pharmacologic stress. 2. The overall quality of the study is excellent. There is no evidence of abnormal lung activity. Stress and rest SPECT images demonstrate homogeneous tracer distribution throughout the myocardium. Gated SPECT imaging reveals normal myocardial thickening and wall motion. The left ventricular ejection fraction was normal (68%). 3. Low risk study.  Echocardiogram 08/05/2017: Normal LV systolic function, EF 91-79%, normal diastolic function. Mild aortic stenosis, moderate aortic regurgitation. Aortic valve area 1.11 cm.  Mild MR, mild TR, PA pressure 31 mmHg.  Moderate left atrial enlargement, a PFO cannot be excluded.  Abdominal aortic duplex 05/17/2018: The maximum aorta diameter is 2.63 cm (dist). Diffuse plaque observed in the distal aorta. Peak systolic velocities in  the left internal iliac artery are mildly increased to 175.54 cm/s. suggestive of <50% stenosis. No AAA noted.  Carotid artery duplex 06/19/2020: Stenosis in the right internal carotid artery (16-49%). Stenosis in the right external carotid artery (<50%). Stenosis in the left internal carotid artery  (50-69%). Stenosis in the left external carotid artery (<50%). Antegrade right vertebral artery flow. Antegrade left vertebral artery flow. Compared to the study done on 06/18/2019, there is progression of stenosis severity in bilateral carotid arteries.  Previously <50% on the right and minimal on the left. Follow up in six months is appropriate if clinically indicated.  EKG:    EKG 07/14/2020: Normal sinus rhythm at rate of 77 bpm, with sinus arrhythmia, single PAC, left atrial enlargement, left axis deviation, left anterior fascicular block.  No evidence of ischemia.  Single PAC.  No significant change from 04/19/2018.   Assessment   No diagnosis found. No orders of the defined types were placed in this encounter.  There are no discontinued medications. No orders of the defined types were placed in this encounter.   Recommendations:   LYNDEE HERBST  is a 80 y.o. fairly active and independent Caucasian female patient with hypertension, hyperlipidemia, COPD with centrilobular emphysema, hypothyroidism, migraines, GERD, coronary calcification noted on the CT scan in December 2019,  presents here for follow-up of dyspnea and asymptomatic carotid stenosis.   She is presently doing well but has noticed gradually worsening dyspnea and is now seriously thinking about quitting smoking and has set up her birthday as a quit date, October 01, 2021.   I discussed the findings of the carotid artery duplex with her, she will need continued surveillance.  Her lipids reviewed, since switching her from pravastatin to Crestor, lipids are completely normalized and she is tolerating statin without any side effects.  Blood pressure is also well controlled, I have encouraged her to quit smoking.  I discussed the ill effects of smoking on overall health and also cardiovascular and pulmonary complications that can occur.  I encouraged her to quit smoking while she is still ahead of the game.  She appears motivated, I  have prescribed nicotine patches although previously reported rash, patient wants to try nicotine patches.  I have also advised her to quit smoking in the house and in the car while in preparation for complete smoking cessation on October 01, 2020.  Otherwise she remained stable, I will see her back in 6 months for follow-up with repeat carotid artery duplex which are remained stable. External labs reviewed.  Renal function has remained stable.  This was a 25-minute office visit encounter and I spent additional 8 minutes on counseling for tobacco use cessation.   Adrian Prows, MD, [facility] 09/25/2020, 1:16 PM Office: [phone] Pager: [phone]

## 2020-10-07 DIAGNOSIS — M48062 Spinal stenosis, lumbar region with neurogenic claudication: Secondary | ICD-10-CM | POA: Diagnosis not present

## 2020-10-30 DIAGNOSIS — E782 Mixed hyperlipidemia: Secondary | ICD-10-CM | POA: Diagnosis not present

## 2020-10-30 DIAGNOSIS — N1832 Chronic kidney disease, stage 3b: Secondary | ICD-10-CM | POA: Diagnosis not present

## 2020-10-30 DIAGNOSIS — E039 Hypothyroidism, unspecified: Secondary | ICD-10-CM | POA: Diagnosis not present

## 2020-10-30 DIAGNOSIS — I129 Hypertensive chronic kidney disease with stage 1 through stage 4 chronic kidney disease, or unspecified chronic kidney disease: Secondary | ICD-10-CM | POA: Diagnosis not present

## 2020-11-04 DIAGNOSIS — M461 Sacroiliitis, not elsewhere classified: Secondary | ICD-10-CM | POA: Diagnosis not present

## 2020-11-30 DIAGNOSIS — E782 Mixed hyperlipidemia: Secondary | ICD-10-CM | POA: Diagnosis not present

## 2020-11-30 DIAGNOSIS — N1832 Chronic kidney disease, stage 3b: Secondary | ICD-10-CM | POA: Diagnosis not present

## 2020-11-30 DIAGNOSIS — E039 Hypothyroidism, unspecified: Secondary | ICD-10-CM | POA: Diagnosis not present

## 2020-11-30 DIAGNOSIS — I129 Hypertensive chronic kidney disease with stage 1 through stage 4 chronic kidney disease, or unspecified chronic kidney disease: Secondary | ICD-10-CM | POA: Diagnosis not present

## 2020-12-17 ENCOUNTER — Other Ambulatory Visit: Payer: Self-pay

## 2020-12-17 ENCOUNTER — Ambulatory Visit: Payer: PPO

## 2020-12-17 DIAGNOSIS — I6523 Occlusion and stenosis of bilateral carotid arteries: Secondary | ICD-10-CM

## 2020-12-21 ENCOUNTER — Other Ambulatory Visit: Payer: Self-pay | Admitting: Cardiology

## 2020-12-21 DIAGNOSIS — I6523 Occlusion and stenosis of bilateral carotid arteries: Secondary | ICD-10-CM

## 2020-12-21 NOTE — Progress Notes (Signed)
Let her know the carotid duplex is stable from previous 6 months ago and will recheck in 6 months. Can you postpone her visit to 6 months from now and I would like to follow her after I repeat carotid duplex.   My chart message to patient You carotid stenosis is stable and will recheck in 6 months. I will see you in 6 months instead of in Oct. My staff will contact you. Unless you still want to see me, I am fine with that.

## 2020-12-22 NOTE — Progress Notes (Signed)
Called and spoke with patient regarding her CAD results.   [place] desk staff: Please change appt date

## 2020-12-22 NOTE — Progress Notes (Signed)
Called patient, NA, LMAM

## 2021-01-07 DIAGNOSIS — M461 Sacroiliitis, not elsewhere classified: Secondary | ICD-10-CM | POA: Diagnosis not present

## 2021-01-30 DIAGNOSIS — N1832 Chronic kidney disease, stage 3b: Secondary | ICD-10-CM | POA: Diagnosis not present

## 2021-01-30 DIAGNOSIS — E039 Hypothyroidism, unspecified: Secondary | ICD-10-CM | POA: Diagnosis not present

## 2021-01-30 DIAGNOSIS — I129 Hypertensive chronic kidney disease with stage 1 through stage 4 chronic kidney disease, or unspecified chronic kidney disease: Secondary | ICD-10-CM | POA: Diagnosis not present

## 2021-01-30 DIAGNOSIS — E782 Mixed hyperlipidemia: Secondary | ICD-10-CM | POA: Diagnosis not present

## 2021-02-03 ENCOUNTER — Emergency Department (HOSPITAL_COMMUNITY)
Admission: EM | Admit: 2021-02-03 | Discharge: 2021-02-03 | Disposition: A | Discharge status: Home / Self Care | Payer: PPO | Attending: Emergency Medicine | Admitting: Emergency Medicine

## 2021-02-03 ENCOUNTER — Encounter (HOSPITAL_COMMUNITY): Payer: Self-pay | Admitting: Emergency Medicine

## 2021-02-03 DIAGNOSIS — D649 Anemia, unspecified: Secondary | ICD-10-CM | POA: Diagnosis not present

## 2021-02-03 DIAGNOSIS — Z5321 Procedure and treatment not carried out due to patient leaving prior to being seen by health care provider: Secondary | ICD-10-CM | POA: Diagnosis not present

## 2021-02-03 DIAGNOSIS — E782 Mixed hyperlipidemia: Secondary | ICD-10-CM | POA: Diagnosis not present

## 2021-02-03 DIAGNOSIS — E039 Hypothyroidism, unspecified: Secondary | ICD-10-CM | POA: Diagnosis not present

## 2021-02-03 DIAGNOSIS — R799 Abnormal finding of blood chemistry, unspecified: Secondary | ICD-10-CM | POA: Insufficient documentation

## 2021-02-03 DIAGNOSIS — I10 Essential (primary) hypertension: Secondary | ICD-10-CM | POA: Diagnosis not present

## 2021-02-03 LAB — CBC WITH DIFFERENTIAL/PLATELET
Abs Immature Granulocytes: 0.04 10*3/uL (ref 0.00–0.07)
Basophils Absolute: 0.1 10*3/uL (ref 0.0–0.1)
Basophils Relative: 1 %
Eosinophils Absolute: 0.2 10*3/uL (ref 0.0–0.5)
Eosinophils Relative: 2 %
HCT: 23.3 % — ABNORMAL LOW (ref 36.0–46.0)
Hemoglobin: 6.8 g/dL — CL (ref 12.0–15.0)
Immature Granulocytes: 1 %
Lymphocytes Relative: 19 %
Lymphs Abs: 1.6 10*3/uL (ref 0.7–4.0)
MCH: 20.4 pg — ABNORMAL LOW (ref 26.0–34.0)
MCHC: 29.2 g/dL — ABNORMAL LOW (ref 30.0–36.0)
MCV: 70 fL — ABNORMAL LOW (ref 80.0–100.0)
Monocytes Absolute: 0.9 10*3/uL (ref 0.1–1.0)
Monocytes Relative: 10 %
Neutro Abs: 5.8 10*3/uL (ref 1.7–7.7)
Neutrophils Relative %: 67 %
Platelets: 340 10*3/uL (ref 150–400)
RBC: 3.33 MIL/uL — ABNORMAL LOW (ref 3.87–5.11)
RDW: 18.9 % — ABNORMAL HIGH (ref 11.5–15.5)
WBC: 8.6 10*3/uL (ref 4.0–10.5)
nRBC: 0 % (ref 0.0–0.2)

## 2021-02-03 LAB — BASIC METABOLIC PANEL
Anion gap: 8 (ref 5–15)
BUN: 12 mg/dL (ref 8–23)
CO2: 24 mmol/L (ref 22–32)
Calcium: 9.1 mg/dL (ref 8.9–10.3)
Chloride: 106 mmol/L (ref 98–111)
Creatinine, Ser: 1.41 mg/dL — ABNORMAL HIGH (ref 0.44–1.00)
GFR, Estimated: 37 mL/min — ABNORMAL LOW (ref >60)
Glucose, Bld: 109 mg/dL — ABNORMAL HIGH (ref 70–99)
Potassium: 4.1 mmol/L (ref 3.5–5.1)
Sodium: 138 mmol/L (ref 135–145)

## 2021-02-03 LAB — TYPE AND SCREEN
ABO/RH(D): A POS
Antibody Screen: NEGATIVE

## 2021-02-03 NOTE — ED Notes (Signed)
Called pt 3x, no response.

## 2021-02-03 NOTE — ED Notes (Signed)
Critical Lab, Hemoglobin 6.8  Provider Notified

## 2021-02-03 NOTE — ED Provider Notes (Signed)
Emergency Medicine Provider Triage Evaluation Note  Joann Barnes , a 80 y.o. female  was evaluated in triage.  Pt complains of low hemoglobin.  She was seen at her primary care office this morning and her hemoglobin was noted to be 6.7.  1 month ago she had diarrhea and was taking a lot of Pepto-Bismol.  This has caused her stools to be black.  Review of Systems  Positive: Dark stools occasionally Negative: Hematochezia, hematuria, hematemesis, dizzy, heart race  Physical Exam  BP (!) 140/44 (BP Location: Left Arm)   Pulse 78   Temp 98.6 F (37 C)   Resp 16   SpO2 96%  Gen:   Awake, no distress   Resp:  Normal effort  MSK:   Moves extremities without difficulty  Other:    Medical Decision Making  Medically screening exam initiated at 2:04 PM.  Appropriate orders placed.  [name] was informed that the remainder of the evaluation will be completed by another provider, this initial triage assessment does not replace that evaluation, and the importance of remaining in the ED until their evaluation is complete.    Potential transfusion   Rhae Hammock, PA-C 02/03/21 1405    Lorelle Gibbs, DO 02/03/21 1423

## 2021-02-03 NOTE — ED Triage Notes (Signed)
Patient sent to ED by PCP after blood draw this morning resulted with hemoglobin of 6.7. Patient denies feeling weak, denies blood in stools. Patient is normotensive in triage. Patient has no complaints at this time.

## 2021-02-05 ENCOUNTER — Emergency Department (HOSPITAL_COMMUNITY): Payer: PPO

## 2021-02-05 ENCOUNTER — Encounter (HOSPITAL_COMMUNITY): Payer: Self-pay

## 2021-02-05 ENCOUNTER — Observation Stay (HOSPITAL_COMMUNITY)
Admission: EM | Admit: 2021-02-05 | Discharge: 2021-02-05 | Disposition: A | Discharge status: Home / Self Care | Payer: PPO | Attending: Internal Medicine | Admitting: Internal Medicine

## 2021-02-05 ENCOUNTER — Other Ambulatory Visit: Payer: Self-pay

## 2021-02-05 DIAGNOSIS — D649 Anemia, unspecified: Principal | ICD-10-CM | POA: Diagnosis present

## 2021-02-05 DIAGNOSIS — J449 Chronic obstructive pulmonary disease, unspecified: Secondary | ICD-10-CM

## 2021-02-05 DIAGNOSIS — Z20822 Contact with and (suspected) exposure to covid-19: Secondary | ICD-10-CM | POA: Diagnosis not present

## 2021-02-05 DIAGNOSIS — F17210 Nicotine dependence, cigarettes, uncomplicated: Secondary | ICD-10-CM | POA: Insufficient documentation

## 2021-02-05 DIAGNOSIS — Z85828 Personal history of other malignant neoplasm of skin: Secondary | ICD-10-CM | POA: Insufficient documentation

## 2021-02-05 DIAGNOSIS — N189 Chronic kidney disease, unspecified: Secondary | ICD-10-CM | POA: Insufficient documentation

## 2021-02-05 DIAGNOSIS — D539 Nutritional anemia, unspecified: Secondary | ICD-10-CM | POA: Diagnosis present

## 2021-02-05 DIAGNOSIS — N179 Acute kidney failure, unspecified: Secondary | ICD-10-CM | POA: Diagnosis not present

## 2021-02-05 DIAGNOSIS — I129 Hypertensive chronic kidney disease with stage 1 through stage 4 chronic kidney disease, or unspecified chronic kidney disease: Secondary | ICD-10-CM | POA: Insufficient documentation

## 2021-02-05 DIAGNOSIS — I10 Essential (primary) hypertension: Secondary | ICD-10-CM | POA: Diagnosis not present

## 2021-02-05 DIAGNOSIS — D519 Vitamin B12 deficiency anemia, unspecified: Secondary | ICD-10-CM

## 2021-02-05 DIAGNOSIS — E039 Hypothyroidism, unspecified: Secondary | ICD-10-CM | POA: Diagnosis not present

## 2021-02-05 DIAGNOSIS — R0602 Shortness of breath: Secondary | ICD-10-CM | POA: Diagnosis not present

## 2021-02-05 DIAGNOSIS — D509 Iron deficiency anemia, unspecified: Secondary | ICD-10-CM

## 2021-02-05 LAB — RETICULOCYTES
Immature Retic Fract: 23.8 % — ABNORMAL HIGH (ref 2.3–15.9)
RBC.: 3.13 MIL/uL — ABNORMAL LOW (ref 3.87–5.11)
Retic Count, Absolute: 40.7 10*3/uL (ref 19.0–186.0)
Retic Ct Pct: 1.3 % (ref 0.4–3.1)

## 2021-02-05 LAB — COMPREHENSIVE METABOLIC PANEL
ALT: 7 U/L (ref 0–44)
AST: 13 U/L — ABNORMAL LOW (ref 15–41)
Albumin: 3.5 g/dL (ref 3.5–5.0)
Alkaline Phosphatase: 41 U/L (ref 38–126)
Anion gap: 10 (ref 5–15)
BUN: 16 mg/dL (ref 8–23)
CO2: 22 mmol/L (ref 22–32)
Calcium: 8.9 mg/dL (ref 8.9–10.3)
Chloride: 107 mmol/L (ref 98–111)
Creatinine, Ser: 1.39 mg/dL — ABNORMAL HIGH (ref 0.44–1.00)
GFR, Estimated: 38 mL/min — ABNORMAL LOW (ref >60)
Glucose, Bld: 102 mg/dL — ABNORMAL HIGH (ref 70–99)
Potassium: 3.8 mmol/L (ref 3.5–5.1)
Sodium: 139 mmol/L (ref 135–145)
Total Bilirubin: 0.3 mg/dL (ref 0.3–1.2)
Total Protein: 5.9 g/dL — ABNORMAL LOW (ref 6.5–8.1)

## 2021-02-05 LAB — IRON AND TIBC
Iron: 9 ug/dL — ABNORMAL LOW (ref 28–170)
Saturation Ratios: 2 % — ABNORMAL LOW (ref 10.4–31.8)
TIBC: 456 ug/dL — ABNORMAL HIGH (ref 250–450)
UIBC: 447 ug/dL

## 2021-02-05 LAB — RESP PANEL BY RT-PCR (FLU A&B, COVID) ARPGX2
Influenza A by PCR: NEGATIVE
Influenza B by PCR: NEGATIVE
SARS Coronavirus 2 by RT PCR: NEGATIVE

## 2021-02-05 LAB — CBC
HCT: 22.3 % — ABNORMAL LOW (ref 36.0–46.0)
Hemoglobin: 6.6 g/dL — CL (ref 12.0–15.0)
MCH: 20.5 pg — ABNORMAL LOW (ref 26.0–34.0)
MCHC: 29.6 g/dL — ABNORMAL LOW (ref 30.0–36.0)
MCV: 69.3 fL — ABNORMAL LOW (ref 80.0–100.0)
Platelets: 348 10*3/uL (ref 150–400)
RBC: 3.22 MIL/uL — ABNORMAL LOW (ref 3.87–5.11)
RDW: 18.7 % — ABNORMAL HIGH (ref 11.5–15.5)
WBC: 7.5 10*3/uL (ref 4.0–10.5)
nRBC: 0 % (ref 0.0–0.2)

## 2021-02-05 LAB — FERRITIN: Ferritin: 4 ng/mL — ABNORMAL LOW (ref 11–307)

## 2021-02-05 LAB — VITAMIN B12: Vitamin B-12: 136 pg/mL — ABNORMAL LOW (ref 180–914)

## 2021-02-05 LAB — PREPARE RBC (CROSSMATCH)

## 2021-02-05 LAB — POC OCCULT BLOOD, ED: Fecal Occult Bld: NEGATIVE

## 2021-02-05 LAB — FOLATE: Folate: 15.6 ng/mL (ref >5.9)

## 2021-02-05 MED ORDER — ALBUTEROL SULFATE (2.5 MG/3ML) 0.083% IN NEBU: 2.5000 mg | INHALATION_SOLUTION | Freq: Four times a day (QID) | RESPIRATORY_TRACT | Status: DC | PRN

## 2021-02-05 MED ORDER — PANTOPRAZOLE 80MG IVPB - SIMPLE MED
80.0000 mg | Freq: Once | INTRAVENOUS | Status: AC
  Administered 2021-02-05: 80 mg via INTRAVENOUS
  Filled 2021-02-05: qty 80

## 2021-02-05 MED ORDER — VITAMIN B-12 1000 MCG PO TABS
1000.0000 ug | ORAL_TABLET | Freq: Every day | ORAL | Status: DC
  Administered 2021-02-05: 1000 ug via ORAL
  Filled 2021-02-05: qty 1

## 2021-02-05 MED ORDER — LACTATED RINGERS IV SOLN: Freq: Once | INTRAVENOUS | Status: DC

## 2021-02-05 MED ORDER — SODIUM CHLORIDE 0.9 % IV SOLN
510.0000 mg | Freq: Once | INTRAVENOUS | Status: AC
  Administered 2021-02-05: 510 mg via INTRAVENOUS
  Filled 2021-02-05: qty 510

## 2021-02-05 MED ORDER — SODIUM CHLORIDE 0.9% FLUSH: 3.0000 mL | Freq: Two times a day (BID) | INTRAVENOUS | Status: DC

## 2021-02-05 MED ORDER — LACTATED RINGERS IV SOLN: INTRAVENOUS | Status: DC

## 2021-02-05 MED ORDER — ACETAMINOPHEN 650 MG RE SUPP: 650.0000 mg | Freq: Four times a day (QID) | RECTAL | Status: DC | PRN

## 2021-02-05 MED ORDER — SODIUM CHLORIDE 0.9 % IV SOLN: 10.0000 mL/h | Freq: Once | INTRAVENOUS | Status: DC

## 2021-02-05 MED ORDER — ACETAMINOPHEN 325 MG PO TABS: 650.0000 mg | ORAL_TABLET | Freq: Four times a day (QID) | ORAL | Status: DC | PRN

## 2021-02-05 NOTE — Consult Note (Addendum)
[facility] Medical Consultation  LAVERE SHINSKY ZOX:[id number] DOB: 1940-07-26 DOA: 02/05/2021 PCP: Merrilee Seashore, MD   Requesting physician: Nanda Quinton, MD Date of consultation: 02/05/2021 Reason for consultation: Symptomatic and  Impression/Recommendations Active Problems:   Symptomatic anemia    Symptomatic anemia iron deficiency anemia: Acute patient presents with hemoglobin 6.6 g/dL which appears relatively unchanged from 2 days ago.  MCV and MCH noted to be significantly low.  Stool guaiacs were noted to be negative.  Patient did report having episodes of diarrhea with dark stools couple weeks, but had also taken Pepto-Bismol at that time.  Denies any NSAID use, but did report history of PUD.  Last available hemoglobin was noted to be around 9 back in 02/2020.   Patient thinks that her last colonoscopy was 6 - 8 years ago, but is unsure with whom and noted that they had difficulty fully evaluating her colon during the procedure.  Patient was typed and screened and ordered to be transfused 1 unit of packed red blood cells.  Anemia panel was significant for iron 9, TIBC 456, ferritin 4, and B12 136. -Recheck H&H posttransfusion -Feraheme iron infusion ordered. Recommend patient follow-up with primary care provider and start oral supplementation ferrous sulfate 325 mg twice daily with meals -Recommend patient to follow-up with Dr. Lorie Apley office in regards to her anemia and need of possible outpatient EGD/attempt repeat colonoscopy  Acute kidney injury: Creatinine noted to be elevated to 1.39 with BUN 16, but baseline previously had been within limits around 1.  Patient had been started on normal saline IV fluids at 75 mL/h. -Recommend patient to have repeat BMP during follow-up visit with her primary care in 1 week  3.  B12 deficiency: Acute Vitamin B12 level was noted to be low at 136. -Recommend vitamin B12 supplementation.  Discussed patient can likely receive vitamin B12  injections in outpatient setting with her primary care provider.  4.  COPD: Patient without acute exacerbation appreciated at this time.  - Continue home inhalers and breathing treatment  5.  Essential hypertension: Blood pressures currently stable home blood pressure medications of amlodipine 5 mg nightly and olmesartan 5 mg nightly. -Okay to resume home blood pressure medications  6.  Lower extremity swelling: Patient takes furosemide as needed for lower extremity swelling.  7. hyperlipidemia -Continue Crestor  Anxiety -Continue current home regimen  7.  Hypothyroidism -Continue levothyroxine and recommend outpatient TSH testing with PCP  I will followup again tomorrow. Please contact me if I can be of assistance in the meanwhile. Thank you for this consultation.  Chief Complaint: Abnormal lab work  HPI:  Joann Barnes is a 80 y.o. female with medical history significant of  hypertension, hyperlipidemia, aortic stenosis, COPD, and hypothyroidism who presents with complaints of having low hemoglobin.  At baseline patient reports that she has shortness of breath related to COPD.  However over the last 2-3 weeks patient reports that it seemed that she was more short of breath than normal and her inhalers of Trelegy and ProAir were not as effective.  She regularly scheduled lab work prior to her routine 66-month office visit with her primary care provider 2 days ago.  Prior to getting home patient reports that she was called and told that her hemoglobin was down to 6.7 g/dL and that she should go to the nearest hospital and get a blood transfusion.  She came to the hospital at that time however due to the wait times had left prior to being  formally seen.  Only other associated symptoms patient reports is lower extremity swelling intermittent burning sensation across the lower part of abdomen that she relates with eating certain foods.  She denies any significant chest pain, nausea, vomiting,  diarrhea, dysuria, NSAID use, or blood in her stools to her knowledge.  Patient reports that approximately 4 weeks ago she did have a weeklong of diarrhea for which she had to take antidiarrhea medicine and Pepto-Bismol before resolution of symptoms.  During that time she did note some dark stools, but attributed to the Pepto-Bismol.  Her last colonoscopy was approximately 6 to 8 years ago with Dr. Collene Mares.  She was told that he had difficulty getting to the right side of her colon and that she should not have any more colonoscopies.  Patient does admit that she previously had a history of peptic ulcer disease, but had not previously advised to be on iron supplements.  She also incidentally notes that she had a history of peptic ulcer disease starting at the age of 56, but denies any prior history of GI bleed.  She previously required a blood transfusion over 20 years ago while having exploratory surgery where she underwent appendectomy and resection of her fallopian tube.   ED Course: Upon admission into the emergency department patient was seen to be afebrile, pulse 54-84, and all other vital signs relatively maintained.  Labs significant for hemoglobin 6.6, MCV 69.3, MCH 20.5 with elevated RDW, creatinine 1.39.  Chest x-ray noted chronic hyperinflation without any acute abnormality.  Stool guaiacs were noted to be negative.  Patient was typed and screened for possible need of blood products and anemia panel was obtained.  Patient was given 80 mg Protonix IV x1 dose and started on normal saline IV fluids at 75 mL/h.    Review of Systems  Constitutional:  Negative for fever.  HENT:  Negative for congestion.   Eyes:  Negative for double vision and photophobia.  Respiratory:  Positive for shortness of breath. Negative for cough.   Cardiovascular:  Positive for leg swelling. Negative for chest pain.  Gastrointestinal:  Positive for abdominal pain. Negative for nausea and vomiting.  Genitourinary:  Negative for  hematuria.  Musculoskeletal:  Negative for falls and myalgias.  Skin:  Negative for rash.  Neurological:  Negative for focal weakness and loss of consciousness.    Past Medical History:  Diagnosis Date   Aortic stenosis    Mild AS 02/2016 echo   Cancer ([place])    skin   COPD (chronic obstructive pulmonary disease) (HCC)    Dyspnea    GERD (gastroesophageal reflux disease)    Hematuria    Hernia, hiatal    HTN (hypertension)    Hyperlipidemia    Hypothyroid    Migraine    OA (osteoarthritis)    OP (osteoporosis)    Other and unspecified angina pectoris    02/2016 - had non-ischemic stress test   Pneumonia    Spondylolisthesis of lumbar region    Past Surgical History:  Procedure Laterality Date   APPENDECTOMY     CATARACT EXTRACTION, BILATERAL     CERVICAL DISC ARTHROPLASTY     x 2   COLONOSCOPY     ESOPHAGOGASTRODUODENOSCOPY  07/19/00   HAND SURGERY Right 2014   LAMINECTOMY  05/23/2017   L4-5, L5-S1   Social History:  reports that she has been smoking cigarettes. She has a 45.00 pack-year smoking history. She has never used smokeless tobacco. She reports that she does not  drink alcohol and does not use drugs.  Allergies  Allergen Reactions   Bactrim [Sulfamethoxazole-Trimethoprim] Shortness Of Breath   Nicotine Anaphylaxis, Itching, Rash and Other (See Comments)    Rash and itch with patch locally; Chewing the gum closed her throat   Codeine Itching and Nausea And Vomiting   Atorvastatin Other (See Comments)    Muscle aches   Benadryl [Diphenhydramine Hcl] Other (See Comments)    Drowsiness    Chantix [Varenicline Tartrate] Hives, Itching, Nausea And Vomiting, Rash and Other (See Comments)    Mental status changes   Varenicline Hives, Itching, Nausea And Vomiting, Rash and Other (See Comments)    Caused mental status changes   Wellbutrin [Bupropion] Rash and Other (See Comments)    Slight rash around ankles   Zolpidem Tartrate Other (See Comments)    Bad dreams    Family History  Problem Relation Age of Onset   Heart disease Mother    Heart disease Father    Heart disease Brother    Cancer Sister        breast   Cancer Sister        lung    Prior to Admission medications   Medication Sig Start Date End Date Taking? Authorizing Provider  acetaminophen (TYLENOL) 500 MG tablet Take 1,000 mg by mouth every 6 (six) hours as needed for mild pain or headache.   Yes [provider]  albuterol (ACCUNEB) 1.25 MG/3ML nebulizer solution Take 1 ampule by nebulization 2 (two) times daily as needed for wheezing or shortness of breath.   Yes [provider]  amLODipine (NORVASC) 5 MG tablet Take 5 mg by mouth at bedtime.   Yes [provider]  AZO-CRANBERRY PO Take 2 tablets by mouth daily as needed (for urinary symptoms).   Yes [provider]  calcium carbonate (TUMS - DOSED IN MG ELEMENTAL CALCIUM) 500 MG chewable tablet Chew 2 tablets by mouth daily.   Yes [provider]  Cholecalciferol (VITAMIN D3) 2000 units TABS Take 2,000 Units by mouth daily.   Yes [provider]  escitalopram (LEXAPRO) 20 MG tablet Take 20 mg by mouth at bedtime. 08/15/17  Yes [provider]  Fluticasone-Umeclidin-Vilant 100-62.5-25 MCG/INH AEPB Inhale 1 puff into the lungs daily.   Yes [provider]  gabapentin (NEURONTIN) 400 MG capsule Take 400 mg by mouth 3 (three) times daily.   Yes [provider]  levothyroxine (SYNTHROID) 25 MCG tablet Take 25 mcg by mouth daily before breakfast.   Yes [provider]  olmesartan (BENICAR) 5 MG tablet Take 5 mg by mouth at bedtime. 08/07/18  Yes [provider]  omeprazole (PRILOSEC) 40 MG capsule Take 40 mg by mouth daily before breakfast.  09/20/17  Yes [provider]  PROAIR HFA 108 (90 Base) MCG/ACT inhaler Inhale 2 puffs into the lungs every 6 (six) hours as needed for wheezing or shortness of breath. 08/25/17  Yes [provider]  rosuvastatin (CRESTOR) 20 MG tablet TAKE 1 TABLET BY MOUTH EVERY DAY. STOP PRAVASTATIN Patient taking differently: Take 20 mg by mouth at bedtime. 08/07/20  Yes Adrian Prows, MD  aspirin EC 81 MG EC tablet Take 1 tablet (81 mg total) by mouth daily. Patient not taking: Reported on 02/05/2021 02/07/16   Geradine Girt, DO  diazepam (VALIUM) 5 MG tablet Take 5 mg by mouth 2 (two) times daily as needed. Patient not taking: No sig reported 07/29/20   [provider]  nicotine (  NICODERM CQ) 21 mg/24hr patch Place 1 patch (21 mg total) onto the skin daily. Patient not taking: No sig reported 09/25/20   Adrian Prows, MD   Physical Exam:  Constitutional: Elderly female currently in no acute distress Vitals:   02/05/21 1118 02/05/21 1200 02/05/21 1345 02/05/21 1455  BP: (!) 148/58 (!) 164/57 (!) 144/49 (!) 142/77  Pulse: 76 75 79 73  Resp: (!) 22 16 16 17   Temp: 97.8 F (36.6 C) 97.9 F (36.6 C) 98 F (36.7 C) 97.8 F (36.6 C)  TempSrc: Oral  Oral Oral  SpO2: 96% 93% 94% 93%  Weight:      Height:       Eyes: PERRL, lids and conjunctivae normal ENMT: Mucous membranes are moist. Posterior pharynx clear of any exudate or lesions.  Neck: normal, supple, no masses, no thyromegaly Respiratory: Mildly decreased aeration with prolonged expiratory phase.  Patient able to maintain O2 saturations on room air Cardiovascular: Regular rate and rhythm, positive 3/6 systolic ejection murmur appreciated.  Trace right lower extremity  edema. 2+ pedal pulses. No carotid bruits.  Abdomen: no tenderness, no masses palpated. No hepatosplenomegaly. Bowel sounds positive.  Musculoskeletal: no clubbing / cyanosis. No joint deformity upper and lower extremities. Good ROM, no contractures. Normal muscle tone.  Skin: no rashes, lesions, ulcers. No induration Neurologic: CN 2-12 grossly intact. Sensation intact, DTR normal. Strength 5/5 in all 4.  Psychiatric: Normal judgment and insight. Alert and  oriented x 3. Normal mood.   Labs on Admission:  Basic Metabolic Panel: Recent Labs  Lab 02/03/21 1404 02/05/21 0900  NA 138 139  K 4.1 3.8  CL 106 107  CO2 24 22  GLUCOSE 109* 102*  BUN 12 16  CREATININE 1.41* 1.39*  CALCIUM 9.1 8.9   Liver Function Tests: Recent Labs  Lab 02/05/21 0900  AST 13*  ALT 7  ALKPHOS 41  BILITOT 0.3  PROT 5.9*  ALBUMIN 3.5   No results for input(s): LIPASE, AMYLASE in the last 168 hours. No results for input(s): AMMONIA in the last 168 hours. CBC: Recent Labs  Lab 02/03/21 1404 02/05/21 0900  WBC 8.6 7.5  NEUTROABS 5.8  --   HGB 6.8* 6.6*  HCT 23.3* 22.3*  MCV 70.0* 69.3*  PLT 340 348   Cardiac Enzymes: No results for input(s): CKTOTAL, CKMB, CKMBINDEX, TROPONINI in the last 168 hours. BNP: Invalid input(s): POCBNP CBG: No results for input(s): GLUCAP in the last 168 hours.  Radiological Exams on Admission: DG Chest 2 View  Result Date: 02/05/2021 CLINICAL DATA:  80 year old female with shortness of breath for several days. COPD. EXAM: CHEST - 2 VIEW COMPARISON:  Chest CT 04/07/2020 and earlier. FINDINGS: PA and lateral views. Chronic pulmonary hyperinflation. Mediastinal contours remain within normal limits. Chronic apical lung scarring. No pneumothorax, pulmonary edema, pleural effusion or acute pulmonary opacity. Visualized tracheal air column is within normal limits. Postoperative changes to the cervical spine and right shoulder. No acute osseous abnormality identified. Calcified aortic atherosclerosis. Negative visible bowel gas pattern. IMPRESSION: 1. Chronic lung disease with hyperinflation. No acute cardiopulmonary abnormality. 2.  Aortic Atherosclerosis (ICD10-I70.0). Electronically Signed   By: Genevie Ann M.D.   On: 02/05/2021 09:13    EKG: Independently reviewed.  Normal sinus rhythm at 73 bpm  Time spent: >45 minutes  [facility]   If 7PM-7AM, please contact night-coverage

## 2021-02-05 NOTE — ED Notes (Signed)
NS infusing, not LR per Dr Tamala Julian ok. LR not available

## 2021-02-05 NOTE — ED Provider Notes (Signed)
Emergency Department Provider Note   I have reviewed the triage vital signs and the nursing notes.   HISTORY  Chief Complaint Anemia   HPI Joann Barnes is a 80 y.o. female with past medical history reviewed below presents to the emergency department with low hemoglobin found on outpatient labs.  Patient had noticed some mild dyspnea but also notes that she has COPD and so did not think much of it.  She was getting routine lab work for a follow-up appointment when her hemoglobin was found to be 6.7.  She presented to the emergency department, as instructed, 2 days ago but ultimately left due to very [facility]iv[facility] wait times.  She returns today for evaluation.  She not having abdominal pain.  She states several weeks ago she did have an episode diarrhea for several days and was taking Pepto-Bismol which did cause her stools to be somewhat black.  She has no prior history of GI bleeding or anemia requiring blood transfusion.  She recalls having a colonoscopy 6 to 7 years ago but notes that the GI doctor told her that they had difficulty with this and that she should not get additional studies. She is unsure of the exact complication or the GI doctor/practice that she saw.    Past Medical History:  Diagnosis Date   Aortic stenosis    Mild AS 02/2016 echo   Cancer (HCC)    skin   COPD (chronic obstructive pulmonary disease) (HCC)    Dyspnea    GERD (gastroesophageal reflux disease)    Hematuria    Hernia, hiatal    HTN (hypertension)    Hyperlipidemia    Hypothyroid    Migraine    OA (osteoarthritis)    OP (osteoporosis)    Other and unspecified angina pectoris    02/2016 - had non-ischemic stress test   Pneumonia    Spondylolisthesis of lumbar region     Patient Active Problem List   Diagnosis Date Noted   Symptomatic anemia 02/05/2021   Acromial fracture 06/05/2020   Encephalopathy acute 02/17/2020   Healthcare maintenance 04/09/2019   Abnormal findings on diagnostic imaging of  lung 04/08/2019   History of lumbar spinal fusion 03/14/2019   Trochanteric bursitis, right hip 03/14/2019   Spondylolisthesis of lumbar region 05/23/2017   Acute kidney injury superimposed on chronic kidney disease ([place]) 02/04/2016   Chest pain with moderate risk of acute coronary syndrome 02/04/2016   Tobacco use 02/04/2016   Hypokalemia 02/04/2016   Murmur, cardiac-suspect this is AS 02/04/2016   Hyperlipidemia    GERD (gastroesophageal reflux disease)    HTN (hypertension)    Hypothyroid    Chest pain    COPD with +BD  11/16/2010   Dyspnea 10/22/2010   Smoking 10/22/2010    Past Surgical History:  Procedure Laterality Date   APPENDECTOMY     CATARACT EXTRACTION, BILATERAL     CERVICAL DISC ARTHROPLASTY     x 2   COLONOSCOPY     ESOPHAGOGASTRODUODENOSCOPY  07/19/00   HAND SURGERY Right 2014   LAMINECTOMY  05/23/2017   L4-5, L5-S1    Allergies Bactrim [sulfamethoxazole-trimethoprim], Nicotine, Codeine, Atorvastatin, Benadryl [diphenhydramine hcl], Chantix [varenicline tartrate], Varenicline, Wellbutrin [bupropion], and Zolpidem tartrate  Family History  Problem Relation Age of Onset   Heart disease Mother    Heart disease Father    Heart disease Brother    Cancer Sister        breast   Cancer Sister  lung    Social History Social History   Tobacco Use   Smoking status: Every Day    Packs/day: 1.00    Years: 45.00    Pack years: 45.00    Types: Cigarettes   Smokeless tobacco: Never   Tobacco comments:    1 pack smoked daily ARJ 06/03/20  Vaping Use   Vaping Use: Never used  Substance Use Topics   Alcohol use: No   Drug use: No    Review of Systems  Constitutional: No fever/chills Eyes: No visual changes. ENT: No sore throat. Cardiovascular: Denies chest pain. Respiratory: Positive shortness of breath. Gastrointestinal: No abdominal pain.  No nausea, no vomiting.  No diarrhea.  No constipation. Genitourinary: Negative for  dysuria. Musculoskeletal: Negative for back pain. Skin: Negative for rash. Neurological: Negative for headaches, focal weakness or numbness.  10-point ROS otherwise negative.  ____________________________________________   PHYSICAL EXAM:  VITAL SIGNS: ED Triage Vitals  Enc Vitals Group     BP 02/05/21 0816 (!) 157/49     Pulse Rate 02/05/21 0816 84     Resp 02/05/21 0816 18     Temp 02/05/21 0816 98.6 F (37 C)     Temp Source 02/05/21 0816 Oral     SpO2 02/05/21 0816 99 %     Weight 02/05/21 0817 143 lb (64.9 kg)     Height 02/05/21 0817 5\' 3"  (1.6 m)   Constitutional: Alert and oriented. Well appearing and in no acute distress. Eyes: Conjunctivae are normal.  Head: Atraumatic. Nose: No congestion/rhinnorhea. Mouth/Throat: Mucous membranes are moist.   Neck: No stridor.   Cardiovascular: Normal rate, regular rhythm. Good peripheral circulation. Grossly normal heart sounds.   Respiratory: Normal respiratory effort.  No retractions. Lungs CTAB. Gastrointestinal: Soft and nontender. No distention.  Rectal exam performed with patient's verbal consent and nurse tech chaperone.  Brown stool in the rectal vault with no gross blood or melena.  Musculoskeletal: No lower extremity tenderness nor edema. No gross deformities of extremities. Neurologic:  Normal speech and language. No gross focal neurologic deficits are appreciated.  Skin:  Skin is warm, dry and intact. No rash noted.  ____________________________________________   LABS (all labs ordered are listed, but only abnormal results are displayed)  Labs Reviewed  COMPREHENSIVE METABOLIC PANEL - Abnormal; Notable for the following components:      Result Value   Glucose, Bld 102 (*)    Creatinine, Ser 1.39 (*)    Total Protein 5.9 (*)    AST 13 (*)    GFR, Estimated 38 (*)    All other components within normal limits  CBC - Abnormal; Notable for the following components:   RBC 3.22 (*)    Hemoglobin 6.6 (*)    HCT  22.3 (*)    MCV 69.3 (*)    MCH 20.5 (*)    MCHC 29.6 (*)    RDW 18.7 (*)    All other components within normal limits  VITAMIN B12 - Abnormal; Notable for the following components:   Vitamin B-12 136 (*)    All other components within normal limits  IRON AND TIBC - Abnormal; Notable for the following components:   Iron 9 (*)    TIBC 456 (*)    Saturation Ratios 2 (*)    All other components within normal limits  FERRITIN - Abnormal; Notable for the following components:   Ferritin 4 (*)    All other components within normal limits  RETICULOCYTES - Abnormal; Notable for  the following components:   RBC. 3.13 (*)    Immature Retic Fract 23.8 (*)    All other components within normal limits  RESP PANEL BY RT-PCR (FLU A&B, COVID) ARPGX2  FOLATE  POC OCCULT BLOOD, ED  TYPE AND SCREEN  PREPARE RBC (CROSSMATCH)   ____________________________________________  EKG   EKG Interpretation  Date/Time:  Thursday February 05 2021 08:19:36 EDT Ventricular Rate:  73 PR Interval:  166 QRS Duration: 110 QT Interval:  416 QTC Calculation: 458 R Axis:   -33 Text Interpretation: Normal sinus rhythm Left axis deviation Moderate voltage criteria for LVH, may be normal variant ( R in aVL , Cornell product ) Abnormal ECG Confirmed by Nanda Quinton [id number]) on 02/05/2021 9:25:23 AM        ____________________________________________  RADIOLOGY  DG Chest 2 View  Result Date: 02/05/2021 CLINICAL DATA:  80 year old female with shortness of breath for several days. COPD. EXAM: CHEST - 2 VIEW COMPARISON:  Chest CT 04/07/2020 and earlier. FINDINGS: PA and lateral views. Chronic pulmonary hyperinflation. Mediastinal contours remain within normal limits. Chronic apical lung scarring. No pneumothorax, pulmonary edema, pleural effusion or acute pulmonary opacity. Visualized tracheal air column is within normal limits. Postoperative changes to the cervical spine and right shoulder. No acute osseous  abnormality identified. Calcified aortic atherosclerosis. Negative visible bowel gas pattern. IMPRESSION: 1. Chronic lung disease with hyperinflation. No acute cardiopulmonary abnormality. 2.  Aortic Atherosclerosis (ICD10-I70.0). Electronically Signed   By: Genevie Ann M.D.   On: 02/05/2021 09:13    ____________________________________________   PROCEDURES  Procedure(s) performed:   Procedures  CRITICAL CARE Performed by: Margette Fast Total critical care time: 35 minutes Critical care time was exclusive of separately billable procedures and treating other patients. Critical care was necessary to treat or prevent imminent or life-threatening deterioration. Critical care was time spent personally by me on the following activities: development of treatment plan with patient and/or surrogate as well as nursing, discussions with consultants, evaluation of patient's response to treatment, examination of patient, obtaining history from patient or surrogate, ordering and performing treatments and interventions, ordering and review of laboratory studies, ordering and review of radiographic studies, pulse oximetry and re-evaluation of patient's condition.  Nanda Quinton, MD Emergency Medicine  ____________________________________________   INITIAL IMPRESSION / ASSESSMENT AND PLAN / ED COURSE  Pertinent labs & imaging results that were available during my care of the patient were reviewed by me and considered in my medical decision making (see chart for details).   Patient presents emergency department with largely asymptomatic anemia.  Hemoglobin into the high 6 range on 2 prior visits.  We will repeat lab work here but anticipate PRBC transfusion.  Discussed risk/benefit with the patient who agrees to this procedure.  No gross blood or melena on rectal exam.  Patient is Hemoccult negative.  Did have episode of diarrhea which was darker several weeks ago which could have been a bleeding event but  difficult to prove retroactively.   Hb dropped to 6.6. Hemoccult negative. MCV is low.   Discussed the case with [facility] Dr. Tamala Julian who evaluated and will leave a consult note. Patient has received PRBC and iron infusion. She is feeling well and would prefer to follow up as an outpatient. Will discharge after iron infusion is complete.  ____________________________________________  FINAL CLINICAL IMPRESSION(S) / ED DIAGNOSES  Final diagnoses:  Symptomatic anemia     MEDICATIONS GIVEN DURING THIS VISIT:  Medications  pantoprazole (PROTONIX) 80 mg /NS 100 mL IVPB (0  mg Intravenous Stopped 02/05/21 1016)  ferumoxytol (FERAHEME) 510 mg in sodium chloride 0.9 % 100 mL IVPB (0 mg Intravenous Stopped 02/05/21 1547)      Note:  This document was prepared using Dragon voice recognition software and may include unintentional dictation errors.  Nanda Quinton, MD, [facility] Emergency Medicine    Lajuan Kovaleski, [facility], MD 02/07/21 [phone]

## 2021-02-05 NOTE — ED Triage Notes (Signed)
Pt returns due to low hgb 6.8. pt seen here 3 days ago but LWBS after triage. Pt having some DOE but denies blood in stool. Pt ambulatory.

## 2021-02-06 LAB — BPAM RBC
Blood Product Expiration Date: 202210122359
ISSUE DATE / TIME: 202210061050
Unit Type and Rh: 6200

## 2021-02-06 LAB — TYPE AND SCREEN
ABO/RH(D): A POS
Antibody Screen: NEGATIVE
Unit division: 0

## 2021-02-10 DIAGNOSIS — D500 Iron deficiency anemia secondary to blood loss (chronic): Secondary | ICD-10-CM | POA: Diagnosis not present

## 2021-02-10 DIAGNOSIS — I700 Atherosclerosis of aorta: Secondary | ICD-10-CM | POA: Diagnosis not present

## 2021-02-10 DIAGNOSIS — I6523 Occlusion and stenosis of bilateral carotid arteries: Secondary | ICD-10-CM | POA: Diagnosis not present

## 2021-02-10 DIAGNOSIS — Z0000 Encounter for general adult medical examination without abnormal findings: Secondary | ICD-10-CM | POA: Diagnosis not present

## 2021-02-10 DIAGNOSIS — E538 Deficiency of other specified B group vitamins: Secondary | ICD-10-CM | POA: Diagnosis not present

## 2021-02-10 DIAGNOSIS — J432 Centrilobular emphysema: Secondary | ICD-10-CM | POA: Diagnosis not present

## 2021-02-10 DIAGNOSIS — N1832 Chronic kidney disease, stage 3b: Secondary | ICD-10-CM | POA: Diagnosis not present

## 2021-02-10 DIAGNOSIS — E782 Mixed hyperlipidemia: Secondary | ICD-10-CM | POA: Diagnosis not present

## 2021-02-10 DIAGNOSIS — K52831 Collagenous colitis: Secondary | ICD-10-CM | POA: Diagnosis not present

## 2021-02-10 DIAGNOSIS — Z23 Encounter for immunization: Secondary | ICD-10-CM | POA: Diagnosis not present

## 2021-02-10 DIAGNOSIS — E039 Hypothyroidism, unspecified: Secondary | ICD-10-CM | POA: Diagnosis not present

## 2021-02-10 DIAGNOSIS — D692 Other nonthrombocytopenic purpura: Secondary | ICD-10-CM | POA: Diagnosis not present

## 2021-02-12 DIAGNOSIS — D509 Iron deficiency anemia, unspecified: Secondary | ICD-10-CM | POA: Diagnosis not present

## 2021-02-12 DIAGNOSIS — R634 Abnormal weight loss: Secondary | ICD-10-CM | POA: Diagnosis not present

## 2021-02-12 DIAGNOSIS — K52831 Collagenous colitis: Secondary | ICD-10-CM | POA: Diagnosis not present

## 2021-02-12 DIAGNOSIS — K5904 Chronic idiopathic constipation: Secondary | ICD-10-CM | POA: Diagnosis not present

## 2021-02-12 DIAGNOSIS — Z1211 Encounter for screening for malignant neoplasm of colon: Secondary | ICD-10-CM | POA: Diagnosis not present

## 2021-02-13 ENCOUNTER — Other Ambulatory Visit: Payer: Self-pay | Admitting: Gastroenterology

## 2021-02-18 DIAGNOSIS — K635 Polyp of colon: Secondary | ICD-10-CM | POA: Diagnosis not present

## 2021-02-18 DIAGNOSIS — K31819 Angiodysplasia of stomach and duodenum without bleeding: Secondary | ICD-10-CM | POA: Diagnosis not present

## 2021-02-18 DIAGNOSIS — Z1211 Encounter for screening for malignant neoplasm of colon: Secondary | ICD-10-CM | POA: Diagnosis not present

## 2021-02-18 DIAGNOSIS — K2100 Gastro-esophageal reflux disease with esophagitis, without bleeding: Secondary | ICD-10-CM | POA: Diagnosis not present

## 2021-02-18 DIAGNOSIS — D509 Iron deficiency anemia, unspecified: Secondary | ICD-10-CM | POA: Diagnosis not present

## 2021-02-18 DIAGNOSIS — K219 Gastro-esophageal reflux disease without esophagitis: Secondary | ICD-10-CM | POA: Diagnosis not present

## 2021-02-18 DIAGNOSIS — K2970 Gastritis, unspecified, without bleeding: Secondary | ICD-10-CM | POA: Diagnosis not present

## 2021-02-18 DIAGNOSIS — D122 Benign neoplasm of ascending colon: Secondary | ICD-10-CM | POA: Diagnosis not present

## 2021-02-18 DIAGNOSIS — D125 Benign neoplasm of sigmoid colon: Secondary | ICD-10-CM | POA: Diagnosis not present

## 2021-02-25 ENCOUNTER — Other Ambulatory Visit: Payer: Self-pay | Admitting: Gastroenterology

## 2021-03-02 DIAGNOSIS — E039 Hypothyroidism, unspecified: Secondary | ICD-10-CM | POA: Diagnosis not present

## 2021-03-02 DIAGNOSIS — E782 Mixed hyperlipidemia: Secondary | ICD-10-CM | POA: Diagnosis not present

## 2021-03-02 DIAGNOSIS — N1832 Chronic kidney disease, stage 3b: Secondary | ICD-10-CM | POA: Diagnosis not present

## 2021-03-02 DIAGNOSIS — I129 Hypertensive chronic kidney disease with stage 1 through stage 4 chronic kidney disease, or unspecified chronic kidney disease: Secondary | ICD-10-CM | POA: Diagnosis not present

## 2021-03-03 ENCOUNTER — Ambulatory Visit (HOSPITAL_COMMUNITY): Admit: 2021-03-03 | Payer: PPO | Admitting: Gastroenterology

## 2021-03-03 ENCOUNTER — Encounter (HOSPITAL_COMMUNITY): Payer: Self-pay

## 2021-03-03 SURGERY — ESOPHAGOGASTRODUODENOSCOPY (EGD) WITH PROPOFOL
Anesthesia: Monitor Anesthesia Care

## 2021-03-05 DIAGNOSIS — M461 Sacroiliitis, not elsewhere classified: Secondary | ICD-10-CM | POA: Diagnosis not present

## 2021-03-05 DIAGNOSIS — M47816 Spondylosis without myelopathy or radiculopathy, lumbar region: Secondary | ICD-10-CM | POA: Diagnosis not present

## 2021-03-05 DIAGNOSIS — M7918 Myalgia, other site: Secondary | ICD-10-CM | POA: Diagnosis not present

## 2021-03-06 ENCOUNTER — Encounter (HOSPITAL_COMMUNITY)
Admission: RE | Disposition: A | Discharge status: Home / Self Care | Payer: Self-pay | Source: Home / Self Care | Attending: Gastroenterology

## 2021-03-06 ENCOUNTER — Ambulatory Visit (HOSPITAL_COMMUNITY): Payer: PPO | Admitting: Anesthesiology

## 2021-03-06 ENCOUNTER — Encounter (HOSPITAL_COMMUNITY): Payer: Self-pay | Admitting: Gastroenterology

## 2021-03-06 ENCOUNTER — Other Ambulatory Visit: Payer: Self-pay

## 2021-03-06 ENCOUNTER — Ambulatory Visit (HOSPITAL_COMMUNITY)
Admission: RE | Admit: 2021-03-06 | Discharge: 2021-03-06 | Disposition: A | Discharge status: Home / Self Care | Payer: PPO | Attending: Gastroenterology | Admitting: Gastroenterology

## 2021-03-06 DIAGNOSIS — E039 Hypothyroidism, unspecified: Secondary | ICD-10-CM | POA: Diagnosis not present

## 2021-03-06 DIAGNOSIS — K449 Diaphragmatic hernia without obstruction or gangrene: Secondary | ICD-10-CM | POA: Diagnosis not present

## 2021-03-06 DIAGNOSIS — K2971 Gastritis, unspecified, with bleeding: Secondary | ICD-10-CM | POA: Diagnosis not present

## 2021-03-06 DIAGNOSIS — K31811 Angiodysplasia of stomach and duodenum with bleeding: Secondary | ICD-10-CM | POA: Insufficient documentation

## 2021-03-06 DIAGNOSIS — I10 Essential (primary) hypertension: Secondary | ICD-10-CM | POA: Diagnosis not present

## 2021-03-06 DIAGNOSIS — J449 Chronic obstructive pulmonary disease, unspecified: Secondary | ICD-10-CM | POA: Diagnosis not present

## 2021-03-06 DIAGNOSIS — K2970 Gastritis, unspecified, without bleeding: Secondary | ICD-10-CM | POA: Insufficient documentation

## 2021-03-06 DIAGNOSIS — M479 Spondylosis, unspecified: Secondary | ICD-10-CM | POA: Insufficient documentation

## 2021-03-06 DIAGNOSIS — Z8711 Personal history of peptic ulcer disease: Secondary | ICD-10-CM | POA: Insufficient documentation

## 2021-03-06 DIAGNOSIS — D509 Iron deficiency anemia, unspecified: Secondary | ICD-10-CM | POA: Diagnosis not present

## 2021-03-06 DIAGNOSIS — F17210 Nicotine dependence, cigarettes, uncomplicated: Secondary | ICD-10-CM | POA: Insufficient documentation

## 2021-03-06 DIAGNOSIS — K5521 Angiodysplasia of colon with hemorrhage: Secondary | ICD-10-CM | POA: Diagnosis not present

## 2021-03-06 HISTORY — PX: HOT HEMOSTASIS: SHX5433

## 2021-03-06 HISTORY — PX: ENTEROSCOPY: SHX5533

## 2021-03-06 LAB — POCT I-STAT, CHEM 8
BUN: 18 mg/dL (ref 8–23)
Calcium, Ion: 1.21 mmol/L (ref 1.15–1.40)
Chloride: 106 mmol/L (ref 98–111)
Creatinine, Ser: 1.2 mg/dL — ABNORMAL HIGH (ref 0.44–1.00)
Glucose, Bld: 88 mg/dL (ref 70–99)
HCT: 34 % — ABNORMAL LOW (ref 36.0–46.0)
Hemoglobin: 11.6 g/dL — ABNORMAL LOW (ref 12.0–15.0)
Potassium: 3.9 mmol/L (ref 3.5–5.1)
Sodium: 141 mmol/L (ref 135–145)
TCO2: 25 mmol/L (ref 22–32)

## 2021-03-06 SURGERY — ENTEROSCOPY
Anesthesia: Monitor Anesthesia Care

## 2021-03-06 MED ORDER — LACTATED RINGERS IV SOLN
INTRAVENOUS | Status: DC
  Administered 2021-03-06: 1000 mL via INTRAVENOUS

## 2021-03-06 MED ORDER — LIDOCAINE HCL (CARDIAC) PF 100 MG/5ML IV SOSY
PREFILLED_SYRINGE | INTRAVENOUS | Status: DC | PRN
  Administered 2021-03-06: 60 mg via INTRAVENOUS
  Administered 2021-03-06: 40 mg via INTRAVENOUS

## 2021-03-06 MED ORDER — PROPOFOL 10 MG/ML IV BOLUS
INTRAVENOUS | Status: DC | PRN
  Administered 2021-03-06: 50 mg via INTRAVENOUS
  Administered 2021-03-06 (×3): 20 mg via INTRAVENOUS

## 2021-03-06 MED ORDER — SODIUM CHLORIDE 0.9 % IV SOLN: INTRAVENOUS | Status: DC

## 2021-03-06 MED ORDER — PROPOFOL 500 MG/50ML IV EMUL
INTRAVENOUS | Status: DC | PRN
  Administered 2021-03-06: 120 ug/kg/min via INTRAVENOUS

## 2021-03-06 NOTE — Anesthesia Postprocedure Evaluation (Signed)
Anesthesia Post Note  Patient: Joann Barnes  Procedure(s) Performed: ENTEROSCOPY HOT HEMOSTASIS (ARGON PLASMA COAGULATION/BICAP)     Patient location during evaluation: PACU Anesthesia Type: MAC Level of consciousness: awake and alert Pain management: pain level controlled Vital Signs Assessment: post-procedure vital signs reviewed and stable Respiratory status: spontaneous breathing, nonlabored ventilation, respiratory function stable and patient connected to nasal cannula oxygen Cardiovascular status: stable and blood pressure returned to baseline Postop Assessment: no apparent nausea or vomiting Anesthetic complications: no   No notable events documented.  Last Vitals:  Vitals:   03/06/21 1227 03/06/21 1238  BP: (!) 169/55   Pulse: 61 66  Resp: 18 18  Temp:    SpO2: 97% 94%    Last Pain:  Vitals:   03/06/21 1238  TempSrc:   PainSc: 0-No pain                 [name]

## 2021-03-06 NOTE — Op Note (Signed)
[facility] Patient Name: Joann Barnes Procedure Date: 03/06/2021 MRN: [id number] Attending MD: Carol Ada , MD Date of Birth: 1940/08/20 CSN: [id number] Age: 80 Admit Type: Outpatient Procedure:                Small bowel enteroscopy Indications:              Arteriovenous malformation in the small intestine Providers:                Carol Ada, MD, Dulcy Fanny, [facility]                            Technician, Technician Referring MD:              Medicines:                Propofol per Anesthesia Complications:            No immediate complications. Estimated Blood Loss:     Estimated blood loss: none. Procedure:                Pre-Anesthesia Assessment:                           - Prior to the procedure, a History and Physical                            was performed, and patient medications and                            allergies were reviewed. The patient's tolerance of                            previous anesthesia was also reviewed. The risks                            and benefits of the procedure and the sedation                            options and risks were discussed with the patient.                            All questions were answered, and informed consent                            was obtained. Prior Anticoagulants: The patient has                            taken no previous anticoagulant or antiplatelet                            agents. ASA Grade Assessment: III - A patient with                            severe systemic disease. After reviewing the risks  and benefits, the patient was deemed in                            satisfactory condition to undergo the procedure.                           - Sedation was administered by an anesthesia                            professional. Deep sedation was attained.                           After obtaining informed consent, the endoscope was                             passed under direct vision. Throughout the                            procedure, the patient's blood pressure, pulse, and                            oxygen saturations were monitored continuously. The                            PCF-HQ190L ([id number]) Olympus colonoscope was                            introduced through the mouth, and advanced to the                            small bowel distal to the Ligament of Treitz. After                            obtaining informed consent, the endoscope was                            passed under direct vision. Throughout the                            procedure, the patient's blood pressure, pulse, and                            oxygen saturations were monitored continuously. The                            small bowel enteroscopy was accomplished without                            difficulty. The patient tolerated the procedure                            well. Scope In: Scope Out: Findings:      A 4 cm hiatal hernia was present.      Segmental moderate inflammation characterized by erythema was found in  the gastric antrum.      Multiple angiodysplastic lesions with bleeding were found in the second       portion of the duodenum. Coagulation for hemostasis using monopolar       probe was successful.      Multiple angiodysplastic lesions with no bleeding were found in the       proximal jejunum. Coagulation for tissue destruction using monopolar       probe was successful. Impression:               - 4 cm hiatal hernia.                           - Gastritis.                           - Multiple bleeding angiodysplastic lesions in the                            duodenum. Treated with a monopolar probe.                           - Multiple non-bleeding angiodysplastic lesions in                            the jejunum. Treated with a monopolar probe.                           - No specimens collected. Recommendation:           - Patient has a  contact number available for                            emergencies. The signs and symptoms of potential                            delayed complications were discussed with the                            patient. Return to normal activities tomorrow.                            Written discharge instructions were provided to the                            patient.                           - Resume regular diet.                           - Follow up with Dr. Collene Mares in one month. Procedure Code(s):        --- Professional ---                           [phone], Small intestinal endoscopy, enteroscopy  beyond second portion of duodenum, not including                            ileum; with ablation of tumor(s), polyp(s), or                            other lesion(s) not amenable to removal by hot                            biopsy forceps, bipolar cautery or snare technique                           44366, 59, Small intestinal endoscopy, enteroscopy                            beyond second portion of duodenum, not including                            ileum; with control of bleeding (eg, injection,                            bipolar cautery, unipolar cautery, laser, heater                            probe, stapler, plasma coagulator) Diagnosis Code(s):        --- Professional ---                           K31.811, Angiodysplasia of stomach and duodenum                            with bleeding                           K44.9, Diaphragmatic hernia without obstruction or                            gangrene                           K29.70, Gastritis, unspecified, without bleeding                           K55.20, Angiodysplasia of colon without hemorrhage CPT copyright 2019 [employer]. All rights reserved. The codes documented in this report are preliminary and upon coder review may  be revised to meet current compliance requirements. Carol Ada,  MD Carol Ada, MD 03/06/2021 12:41:01 PM This report has been signed electronically. Number of Addenda: 0

## 2021-03-06 NOTE — Discharge Instructions (Signed)
YOU HAD AN ENDOSCOPIC PROCEDURE TODAY: Refer to the procedure report and other information in the discharge instructions given to you for any specific questions about what was found during the examination. If this information does not answer your questions, please call [facility] at [phone] to clarify.  ? ?YOU SHOULD EXPECT: Some feelings of bloating in the abdomen. Passage of more gas than usual. Walking can help get rid of the air that was put into your GI tract during the procedure and reduce the bloating. ? ?DIET: Your first meal following the procedure should be a light meal and then it is ok to progress to your normal diet. A half-sandwich or bowl of soup is an example of a good first meal. Heavy or fried foods are harder to digest and may make you feel nauseous or bloated. Drink plenty of fluids but you should avoid alcoholic beverages for 24 hours.  ? ?ACTIVITY: Your care partner should take you home directly after the procedure. You should plan to take it easy, moving slowly for the rest of the day. You can resume normal activity the day after the procedure however YOU SHOULD NOT DRIVE, use power tools, machinery or perform tasks that involve climbing or major physical exertion for 24 hours (because of the sedation medicines used during the test).  ? ?SYMPTOMS TO REPORT IMMEDIATELY: ?A gastroenterologist can be reached at any hour. Please call [phone]  for any of the following symptoms:  ? ?Following upper endoscopy (EGD, EUS, ERCP, esophageal dilation) ?Vomiting of blood or coffee ground material  ?New, significant abdominal pain  ?New, significant chest pain or pain under the shoulder blades  ?Painful or persistently difficult swallowing  ?New shortness of breath  ?Black, tarry-looking or red, bloody stools ? ?FOLLOW UP:  ?If any biopsies were taken you will be contacted by phone or by letter within the next 1-3 weeks. Call [phone]  if you have not heard about the biopsies in 3  weeks.  ?Please also call with any specific questions about appointments or follow up tests.  ?

## 2021-03-06 NOTE — Transfer of Care (Signed)
Immediate Anesthesia Transfer of Care Note  Patient: Joann Barnes  Procedure(s) Performed: ENTEROSCOPY HOT HEMOSTASIS (ARGON PLASMA COAGULATION/BICAP)  Patient Location: PACU  Anesthesia Type:General  Level of Consciousness: awake, alert  and oriented  Airway & Oxygen Therapy: Patient Spontanous Breathing  Post-op Assessment: Report given to RN and Post -op Vital signs reviewed and stable  Post vital signs: Reviewed and stable  Last Vitals:  Vitals Value Taken Time  BP    Temp    Pulse 67 03/06/21 1216  Resp 17 03/06/21 1216  SpO2 96 % 03/06/21 1216    Last Pain:  Vitals:   03/06/21 1034  TempSrc: Oral  PainSc: 0-No pain         Complications: No notable events documented.

## 2021-03-06 NOTE — H&P (Signed)
[place] HPI: This 80 year old white female presents to the office for evaluation of iron deficiency anemia. She had lab work done on 02/03/2021, by her PCP, which revealed a hemoglobin of 6.6 gm/dl, an Iron of 9 and a Ferritin of 4. She was sent [place] and got 1 unit of PRBC's along with an Iron infusion. She was reportedly guaiac negative in the ED. She has periumbilical pain on 82/95/6213 while she was at [facility] but that has since resolved. She has not felt tired or dizzy or weak. She usually has 1 BM every 3 days with no obvious blood or mucus in the stool. She denies any NSAID use. She has a past history of a gastric ulcer,  as a teenager. She has a poor appetite and has lost over 20 pounds in the last year. She denies having any complaints of nausea, vomiting, acid reflux, dysphagia or odynophagia. She denies having a family history of colon cancer, celiac sprue or IBD. Her last colonoscopy done on 05/15/2010 revealed a very redundant colon and the procedure was aborted at the hepatic flexure; random biopsies revealed collagenous colitis. A virtual colonoscopy was done on 05/25/2010 which revealed a slight tortuous colon, no suspicious persistent polpoid lesion is seen and no stricturing lesion was noted. a probable small hiatal hernia is noted, no significant abnormality noted on unenhanced CT of the abdomen or pelvis, DJD is noted in the lumbar spine.  Past Medical History:  Diagnosis Date   Aortic stenosis    Mild AS 02/2016 echo   Cancer (HCC)    skin   COPD (chronic obstructive pulmonary disease) (HCC)    Dyspnea    GERD (gastroesophageal reflux disease)    Hematuria    Hernia, hiatal    HTN (hypertension)    Hyperlipidemia    Hypothyroid    Migraine    OA (osteoarthritis)    OP (osteoporosis)    Other and unspecified angina pectoris    02/2016 - had non-ischemic stress test   Pneumonia    Spondylolisthesis of lumbar region     Past Surgical History:  Procedure  Laterality Date   APPENDECTOMY     CATARACT EXTRACTION, BILATERAL     CERVICAL DISC ARTHROPLASTY     x 2   COLONOSCOPY     ESOPHAGOGASTRODUODENOSCOPY  07/19/00   HAND SURGERY Right 2014   LAMINECTOMY  05/23/2017   L4-5, L5-S1    Family History  Problem Relation Age of Onset   Heart disease Mother    Heart disease Father    Heart disease Brother    Cancer Sister        breast   Cancer Sister        lung    Social History:  reports that she has been smoking cigarettes. She has a 45.00 pack-year smoking history. She has never used smokeless tobacco. She reports that she does not drink alcohol and does not use drugs.  Allergies:  Allergies  Allergen Reactions   Bactrim [Sulfamethoxazole-Trimethoprim] Shortness Of Breath   Nicotine Anaphylaxis, Itching, Rash and Other (See Comments)    Rash and itch with patch locally; Chewing the gum closed her throat   Codeine Itching and Nausea And Vomiting   Atorvastatin Other (See Comments)    Muscle aches   Benadryl [Diphenhydramine Hcl] Other (See Comments)    Drowsiness    Chantix [Varenicline Tartrate] Hives, Itching, Nausea And Vomiting, Rash and Other (See Comments)    Mental status  changes   Wellbutrin [Bupropion] Rash and Other (See Comments)    Slight rash around ankles   Zolpidem Tartrate Other (See Comments)    Bad dreams    Medications: Scheduled: Continuous:  No results found for this or any previous visit (from the past 24 hour(s)).   No results found.  ROS:  As stated above in the HPI otherwise negative.  There were no vitals taken for this visit.    PE: Gen: NAD, Alert and Oriented HEENT:  [place]/AT, EOMI Neck: Supple, no LAD Lungs: CTA Bilaterally CV: RRR without M/G/R ABD: Soft, NTND, +BS Ext: No C/C/E  Assessment/Plan: 1) IDA - EGD/colonoscopy.  Karlon Schlafer D 03/06/2021, 10:20 AM

## 2021-03-06 NOTE — Anesthesia Preprocedure Evaluation (Addendum)
Anesthesia Evaluation  Patient identified by MRN, date of birth, ID band Patient awake    Reviewed: Allergy & Precautions, NPO status , Patient's Chart, lab work & pertinent test results  Airway Mallampati: II  TM Distance: >3 FB Neck ROM: Full    Dental no notable dental hx.    Pulmonary COPD, Current Smoker and Patient abstained from smoking.,    Pulmonary exam normal breath sounds clear to auscultation       Cardiovascular hypertension, Pt. on medications + Valvular Problems/Murmurs AS  Rhythm:Regular Rate:Normal + Systolic murmurs    Neuro/Psych negative neurological ROS  negative psych ROS   GI/Hepatic negative GI ROS, Neg liver ROS,   Endo/Other  Hypothyroidism   Renal/GU negative Renal ROS  negative genitourinary   Musculoskeletal negative musculoskeletal ROS (+)   Abdominal   Peds negative pediatric ROS (+)  Hematology  (+) anemia ,   Anesthesia Other Findings   Reproductive/Obstetrics negative OB ROS                            Anesthesia Physical Anesthesia Plan  ASA: 3  Anesthesia Plan: MAC   Post-op Pain Management:    Induction: Intravenous  PONV Risk Score and Plan: 2 and Propofol infusion and Treatment may vary due to age or medical condition  Airway Management Planned: Simple Face Mask  Additional Equipment:   Intra-op Plan:   Post-operative Plan:   Informed Consent: I have reviewed the patients History and Physical, chart, labs and discussed the procedure including the risks, benefits and alternatives for the proposed anesthesia with the patient or authorized representative who has indicated his/her understanding and acceptance.     Dental advisory given  Plan Discussed with: CRNA and Surgeon  Anesthesia Plan Comments:         Anesthesia Quick Evaluation

## 2021-03-08 ENCOUNTER — Encounter (HOSPITAL_COMMUNITY): Payer: Self-pay | Admitting: Gastroenterology

## 2021-03-30 ENCOUNTER — Ambulatory Visit: Payer: PPO | Admitting: Cardiology

## 2021-03-31 DIAGNOSIS — R197 Diarrhea, unspecified: Secondary | ICD-10-CM | POA: Diagnosis not present

## 2021-03-31 DIAGNOSIS — K219 Gastro-esophageal reflux disease without esophagitis: Secondary | ICD-10-CM | POA: Diagnosis not present

## 2021-03-31 DIAGNOSIS — R194 Change in bowel habit: Secondary | ICD-10-CM | POA: Diagnosis not present

## 2021-03-31 DIAGNOSIS — K52831 Collagenous colitis: Secondary | ICD-10-CM | POA: Diagnosis not present

## 2021-03-31 DIAGNOSIS — D509 Iron deficiency anemia, unspecified: Secondary | ICD-10-CM | POA: Diagnosis not present

## 2021-03-31 DIAGNOSIS — Z7689 Persons encountering health services in other specified circumstances: Secondary | ICD-10-CM | POA: Diagnosis not present

## 2021-04-01 DIAGNOSIS — I129 Hypertensive chronic kidney disease with stage 1 through stage 4 chronic kidney disease, or unspecified chronic kidney disease: Secondary | ICD-10-CM | POA: Diagnosis not present

## 2021-04-01 DIAGNOSIS — E782 Mixed hyperlipidemia: Secondary | ICD-10-CM | POA: Diagnosis not present

## 2021-04-01 DIAGNOSIS — N1832 Chronic kidney disease, stage 3b: Secondary | ICD-10-CM | POA: Diagnosis not present

## 2021-04-01 DIAGNOSIS — E039 Hypothyroidism, unspecified: Secondary | ICD-10-CM | POA: Diagnosis not present

## 2021-04-07 ENCOUNTER — Telehealth: Payer: Self-pay

## 2021-04-07 DIAGNOSIS — I10 Essential (primary) hypertension: Secondary | ICD-10-CM

## 2021-04-07 MED ORDER — AMLODIPINE BESYLATE 10 MG PO TABS: 10.0000 mg | ORAL_TABLET | Freq: Every day | ORAL | 3 refills | Status: DC

## 2021-04-07 NOTE — Telephone Encounter (Signed)
Dr. Einar Gip:  Patient called and stated that she recently went to see her gastroenterologist Dr. Collene Mares and they think that her diarrhea is being caused by her her olmesartan and/or omeprazole and she was told that she COULD NOT just stop the Olmesartan, but she stopped them both anyway, and was put on Pepcid and the diarrhea has gotten better. She wants to know if you want her to go back on the Olmesartan or if you want to send something else in.   MA's : Leave message on answering machine if patient does not answer.

## 2021-04-07 NOTE — Telephone Encounter (Signed)
Received call from Dr. Collene Mares regarding possible Olmesartan causing diarrhea.    ICD-10-CM   1. Primary hypertension  I10 amLODipine (NORVASC) 10 MG tablet     Meds ordered this encounter  Medications   amLODipine (NORVASC) 10 MG tablet    Sig: Take 1 tablet (10 mg total) by mouth daily.    Dispense:  90 tablet    Refill:  3    Discontinue Benicar    Medications Discontinued During This Encounter  Medication Reason   olmesartan (BENICAR) 5 MG tablet Side effect (s)   amLODipine (NORVASC) 5 MG tablet      Adrian Prows, MD, [facility] 04/07/2021, 9:10 AM Office: [phone] Fax: [phone] Pager: [phone]

## 2021-04-08 DIAGNOSIS — D500 Iron deficiency anemia secondary to blood loss (chronic): Secondary | ICD-10-CM | POA: Diagnosis not present

## 2021-04-08 DIAGNOSIS — E538 Deficiency of other specified B group vitamins: Secondary | ICD-10-CM | POA: Diagnosis not present

## 2021-04-08 DIAGNOSIS — Z0000 Encounter for general adult medical examination without abnormal findings: Secondary | ICD-10-CM | POA: Diagnosis not present

## 2021-04-15 DIAGNOSIS — E782 Mixed hyperlipidemia: Secondary | ICD-10-CM | POA: Diagnosis not present

## 2021-04-15 DIAGNOSIS — E538 Deficiency of other specified B group vitamins: Secondary | ICD-10-CM | POA: Diagnosis not present

## 2021-04-15 DIAGNOSIS — D508 Other iron deficiency anemias: Secondary | ICD-10-CM | POA: Diagnosis not present

## 2021-04-15 DIAGNOSIS — J441 Chronic obstructive pulmonary disease with (acute) exacerbation: Secondary | ICD-10-CM | POA: Diagnosis not present

## 2021-04-15 DIAGNOSIS — J432 Centrilobular emphysema: Secondary | ICD-10-CM | POA: Diagnosis not present

## 2021-04-15 DIAGNOSIS — N1832 Chronic kidney disease, stage 3b: Secondary | ICD-10-CM | POA: Diagnosis not present

## 2021-04-15 DIAGNOSIS — I6523 Occlusion and stenosis of bilateral carotid arteries: Secondary | ICD-10-CM | POA: Diagnosis not present

## 2021-04-15 DIAGNOSIS — K52831 Collagenous colitis: Secondary | ICD-10-CM | POA: Diagnosis not present

## 2021-04-15 DIAGNOSIS — D500 Iron deficiency anemia secondary to blood loss (chronic): Secondary | ICD-10-CM | POA: Diagnosis not present

## 2021-04-15 DIAGNOSIS — I700 Atherosclerosis of aorta: Secondary | ICD-10-CM | POA: Diagnosis not present

## 2021-04-15 DIAGNOSIS — D692 Other nonthrombocytopenic purpura: Secondary | ICD-10-CM | POA: Diagnosis not present

## 2021-04-15 DIAGNOSIS — E039 Hypothyroidism, unspecified: Secondary | ICD-10-CM | POA: Diagnosis not present

## 2021-05-01 DIAGNOSIS — I10 Essential (primary) hypertension: Secondary | ICD-10-CM | POA: Diagnosis not present

## 2021-05-01 DIAGNOSIS — Z7951 Long term (current) use of inhaled steroids: Secondary | ICD-10-CM | POA: Diagnosis not present

## 2021-05-01 DIAGNOSIS — Z720 Tobacco use: Secondary | ICD-10-CM | POA: Diagnosis not present

## 2021-05-01 DIAGNOSIS — J449 Chronic obstructive pulmonary disease, unspecified: Secondary | ICD-10-CM | POA: Diagnosis not present

## 2021-05-13 ENCOUNTER — Ambulatory Visit (INDEPENDENT_AMBULATORY_CARE_PROVIDER_SITE_OTHER)
Admission: RE | Admit: 2021-05-13 | Discharge: 2021-05-13 | Disposition: A | Discharge status: Home / Self Care | Payer: PPO | Source: Ambulatory Visit | Attending: Primary Care | Admitting: Primary Care

## 2021-05-13 ENCOUNTER — Other Ambulatory Visit: Payer: Self-pay

## 2021-05-13 DIAGNOSIS — J9811 Atelectasis: Secondary | ICD-10-CM | POA: Diagnosis not present

## 2021-05-13 DIAGNOSIS — J432 Centrilobular emphysema: Secondary | ICD-10-CM | POA: Diagnosis not present

## 2021-05-13 DIAGNOSIS — I3139 Other pericardial effusion (noninflammatory): Secondary | ICD-10-CM | POA: Diagnosis not present

## 2021-05-13 DIAGNOSIS — I700 Atherosclerosis of aorta: Secondary | ICD-10-CM | POA: Diagnosis not present

## 2021-05-13 DIAGNOSIS — R918 Other nonspecific abnormal finding of lung field: Secondary | ICD-10-CM | POA: Diagnosis not present

## 2021-05-13 DIAGNOSIS — R911 Solitary pulmonary nodule: Secondary | ICD-10-CM | POA: Diagnosis not present

## 2021-05-13 DIAGNOSIS — J439 Emphysema, unspecified: Secondary | ICD-10-CM | POA: Diagnosis not present

## 2021-05-28 NOTE — Progress Notes (Signed)
Please let patient know tiny RIGHT lung nodules remain unchanged. Small LEFT lower lobe nodule measuring 6 x 3 mm, minimally increased in size since prior exam. She has a small hiatal hernia. Aortic atherosclerosis. Over all stable scan. She is due for annual follow-up with Dr. Chase Caller

## 2021-06-02 DIAGNOSIS — E782 Mixed hyperlipidemia: Secondary | ICD-10-CM | POA: Diagnosis not present

## 2021-06-02 DIAGNOSIS — N1832 Chronic kidney disease, stage 3b: Secondary | ICD-10-CM | POA: Diagnosis not present

## 2021-06-02 DIAGNOSIS — E039 Hypothyroidism, unspecified: Secondary | ICD-10-CM | POA: Diagnosis not present

## 2021-06-02 DIAGNOSIS — I129 Hypertensive chronic kidney disease with stage 1 through stage 4 chronic kidney disease, or unspecified chronic kidney disease: Secondary | ICD-10-CM | POA: Diagnosis not present

## 2021-06-18 DIAGNOSIS — M810 Age-related osteoporosis without current pathological fracture: Secondary | ICD-10-CM | POA: Diagnosis not present

## 2021-06-26 ENCOUNTER — Other Ambulatory Visit: Payer: Self-pay

## 2021-06-26 ENCOUNTER — Encounter: Payer: Self-pay | Admitting: Cardiology

## 2021-06-26 ENCOUNTER — Ambulatory Visit: Payer: PPO | Admitting: Cardiology

## 2021-06-26 VITALS — BP 121/60 | HR 65 | Temp 98.4°F | Resp 16 | Ht 63.0 in | Wt 137.2 lb

## 2021-06-26 DIAGNOSIS — E7800 Pure hypercholesterolemia, unspecified: Secondary | ICD-10-CM

## 2021-06-26 DIAGNOSIS — I10 Essential (primary) hypertension: Secondary | ICD-10-CM

## 2021-06-26 DIAGNOSIS — I6523 Occlusion and stenosis of bilateral carotid arteries: Secondary | ICD-10-CM | POA: Diagnosis not present

## 2021-06-26 DIAGNOSIS — F17210 Nicotine dependence, cigarettes, uncomplicated: Secondary | ICD-10-CM

## 2021-06-26 DIAGNOSIS — I351 Nonrheumatic aortic (valve) insufficiency: Secondary | ICD-10-CM | POA: Diagnosis not present

## 2021-06-26 NOTE — Progress Notes (Signed)
Primary Physician/Referring:  Merrilee Seashore, MD  Patient ID: Joann Barnes, female    DOB: [birth year], 81 y.o.   MRN: [id number]  Chief Complaint  Patient presents with   orthostatics   Follow-up dizziness and carotid stenosis    6 month   HPI:    Joann Barnes  is a 81 y.o. fairly active and independent Caucasian female patient with hypertension, hyperlipidemia, COPD with centrilobular emphysema, hypothyroidism, migraines, GERD, coronary calcification noted on the CT scan in December 2019,  presents here for follow-up of dyspnea and asymptomatic carotid stenosis.   Had peptic ulcer disease and GI bleed leading to blood transfusion in November 2022.  She is presently otherwise doing well, and denies any worsening leg edema, symptoms of claudication, chest pain or palpitations.  She does have chronic dyspnea and cough.  Past Medical History:  Diagnosis Date   Aortic stenosis    Mild AS 02/2016 echo   Cancer ([place])    skin   COPD (chronic obstructive pulmonary disease) (HCC)    Dyspnea    GERD (gastroesophageal reflux disease)    Hematuria    Hernia, hiatal    HTN (hypertension)    Hyperlipidemia    Hypothyroid    Migraine    OA (osteoarthritis)    OP (osteoporosis)    Other and unspecified angina pectoris    02/2016 - had non-ischemic stress test   Pneumonia    Spondylolisthesis of lumbar region    Past Surgical History:  Procedure Laterality Date   APPENDECTOMY     CATARACT EXTRACTION, BILATERAL     CERVICAL DISC ARTHROPLASTY     x 2   COLONOSCOPY     ENTEROSCOPY N/A 03/06/2021   Procedure: ENTEROSCOPY;  Surgeon: Carol Ada, MD;  Location: [facility];  Service: Endoscopy;  Laterality: N/A;   ESOPHAGOGASTRODUODENOSCOPY  07/19/00   HAND SURGERY Right 2014   HOT HEMOSTASIS N/A 03/06/2021   Procedure: HOT HEMOSTASIS (ARGON PLASMA COAGULATION/BICAP);  Surgeon: Carol Ada, MD;  Location: [facility];  Service: Endoscopy;  Laterality:  N/A;   LAMINECTOMY  05/23/2017   L4-5, L5-S1   Social History   Tobacco Use   Smoking status: Every Day    Packs/day: 1.00    Years: 45.00    Pack years: 45.00    Types: Cigarettes   Smokeless tobacco: Never   Tobacco comments:    1 pack smoked daily ARJ 06/03/20  Substance Use Topics   Alcohol use: No  Marital Status: Widowed    ROS  Review of Systems  Cardiovascular:  Positive for dyspnea on exertion. Negative for chest pain and leg swelling.  Objective  Blood pressure 121/60, pulse 65, temperature 98.4 F (36.9 C), temperature source Temporal, resp. rate 16, height 5\' 3"  (1.6 m), weight 137 lb 3.2 oz (62.2 kg), SpO2 96 %. Body mass index is 24.3 kg/m. Vitals with BMI 06/26/2021 03/06/2021 03/06/2021  Height 5\' 3"  - -  Weight 137 lbs 3 oz - -  BMI 23.76 - -  Systolic 283 - 151  Diastolic 60 - 55  Pulse 65 66 61    Physical Exam Constitutional:      General: She is not in acute distress.    Appearance: She is well-developed.  Neck:     Thyroid: No thyromegaly.     Vascular: No JVD.  Cardiovascular:     Rate and Rhythm: Normal rate and regular rhythm.     Pulses: Intact distal pulses.  Carotid pulses are  on the right side with bruit and  on the left side with bruit.    Heart sounds: S1 normal and S2 normal. Murmur heard.  Midsystolic murmur is present with a grade of 2/6 at the upper right sternal border and apex.    No gallop.  Pulmonary:     Effort: Pulmonary effort is normal.     Breath sounds: Rhonchi (scattered bilateral) present.  Abdominal:     General: Bowel sounds are normal.     Palpations: Abdomen is soft.  Musculoskeletal:     Right lower leg: No edema.     Left lower leg: No edema.   Radiology: No results found.  Laboratory examination:   Recent Labs    02/03/21 1404 02/05/21 0900 03/06/21 1050  NA 138 139 141  K 4.1 3.8 3.9  CL 106 107 106  CO2 24 22  --   GLUCOSE 109* 102* 88  BUN 12 16 18   CREATININE 1.41* 1.39* 1.20*   CALCIUM 9.1 8.9  --   GFRNONAA 37* 38*  --    CMP Latest Ref Rng & Units 03/06/2021 02/05/2021 02/03/2021  Glucose 70 - 99 mg/dL 88 102(H) 109(H)  BUN 8 - 23 mg/dL 18 16 12   Creatinine 0.44 - 1.00 mg/dL 1.20(H) 1.39(H) 1.41(H)  Sodium 135 - 145 mmol/L 141 139 138  Potassium 3.5 - 5.1 mmol/L 3.9 3.8 4.1  Chloride 98 - 111 mmol/L 106 107 106  CO2 22 - 32 mmol/L - 22 24  Calcium 8.9 - 10.3 mg/dL - 8.9 9.1  Total Protein 6.5 - 8.1 g/dL - 5.9(L) -  Total Bilirubin 0.3 - 1.2 mg/dL - 0.3 -  Alkaline Phos 38 - 126 U/L - 41 -  AST 15 - 41 U/L - 13(L) -  ALT 0 - 44 U/L - 7 -   CBC Latest Ref Rng & Units 03/06/2021 02/05/2021 02/03/2021  WBC 4.0 - 10.5 K/uL - 7.5 8.6  Hemoglobin 12.0 - 15.0 g/dL 11.6(L) 6.6(LL) 6.8(LL)  Hematocrit 36.0 - 46.0 % 34.0(L) 22.3(L) 23.3(L)  Platelets 150 - 400 K/uL - 348 340    Lipid Panel Recent Labs    09/12/20 0937  CHOL 151  TRIG 86  LDLCALC 77  HDL 58    External labs:  Labs 04/08/2021:  Hb 11.1/HCT 34.3, platelets 198, normal indicis.  Total cholesterol 132, triglycerides 93, HDL 54, LDL 59.  TSH normal at 2.59.   Medications    Current Outpatient Medications:    acetaminophen (TYLENOL) 500 MG tablet, Take 1,000 mg by mouth every 6 (six) hours as needed for mild pain or headache., Disp: , Rfl:    albuterol (ACCUNEB) 1.25 MG/3ML nebulizer solution, Take 1 ampule by nebulization 2 (two) times daily as needed for wheezing or shortness of breath., Disp: , Rfl:    amLODipine (NORVASC) 10 MG tablet, Take 1 tablet (10 mg total) by mouth daily., Disp: 90 tablet, Rfl: 3   aspirin 81 MG chewable tablet, Chew 81 mg by mouth 2 (two) times a week., Disp: , Rfl:    AZO-CRANBERRY PO, Take 2 tablets by mouth daily as needed (for urinary symptoms)., Disp: , Rfl:    calcium carbonate (TUMS - DOSED IN MG ELEMENTAL CALCIUM) 500 MG chewable tablet, Chew 1 tablet by mouth daily as needed for heartburn or indigestion., Disp: , Rfl:    Cholecalciferol (VITAMIN  D3) 2000 units TABS, Take 2,000 Units by mouth daily., Disp: , Rfl:    diazepam (  VALIUM) 5 MG tablet, Take 5 mg by mouth 2 (two) times daily as needed for muscle spasms., Disp: , Rfl:    escitalopram (LEXAPRO) 20 MG tablet, Take 20 mg by mouth at bedtime., Disp: , Rfl:    famotidine (PEPCID) 40 MG tablet, Take 40 mg by mouth 2 (two) times daily., Disp: , Rfl:    Fluticasone-Umeclidin-Vilant 100-62.5-25 MCG/INH AEPB, Inhale 1 puff into the lungs daily., Disp: , Rfl:    furosemide (LASIX) 20 MG tablet, Take 20-40 mg by mouth daily as needed for edema., Disp: , Rfl:    gabapentin (NEURONTIN) 300 MG capsule, Take 300 mg by mouth 3 (three) times daily., Disp: , Rfl:    levothyroxine (SYNTHROID) 25 MCG tablet, Take 25 mcg by mouth daily before breakfast., Disp: , Rfl:    PROAIR HFA 108 (90 Base) MCG/ACT inhaler, Inhale 2 puffs into the lungs every 6 (six) hours as needed for wheezing or shortness of breath., Disp: , Rfl:    rosuvastatin (CRESTOR) 20 MG tablet, TAKE 1 TABLET BY MOUTH EVERY DAY. STOP PRAVASTATIN (Patient taking differently: Take 20 mg by mouth at bedtime.), Disp: 90 tablet, Rfl: 3   zoledronic acid (RECLAST) 5 MG/100ML SOLN injection, Inject 5 mg into the vein once., Disp: , Rfl:     Cardiac Studies:   CT chest without contrast 05/13/2021: Cardiovascular: Atherosclerotic calcifications aorta, coronary arteries, and proximal great vessels. Aorta normal caliber. Heart size normal. Minimal pericardial effusion. Lungs/Pleura: Hyperinflation and diffuse centrilobular emphysema. Biapical scarring greater on RIGHT. Mild atelectasis along RIGHT major fissure, new.  6 x 17mm LEFT lower lobe nodule image 125 previously 5 x 3 mm. Tiny RIGHT lung nodules peripherally unchanged. Scattered mucous plugging in the lower lobes. Posterior RIGHT diaphragmatic hernia containing fat noted.   Lexiscan myoview stress test 04/24/2018: 1. Lexiscan stress test was performed. Exercise capacity was not  assessed. Stress symptoms included dyspnea, flushing, dizziness. Resting blood pressure 184/80 mmHg, peak effect blood pressure 140/70 mmHg. The resting and stress electrocardiogram demonstrated normal sinus rhythm, normal resting conduction, no resting arrhythmias, normal rest repolarization, and poor R wave progression. Stress EKG is non diagnostic for ischemia as it is a pharmacologic stress. 2. The overall quality of the study is excellent. There is no evidence of abnormal lung activity. Stress and rest SPECT images demonstrate homogeneous tracer distribution throughout the myocardium. Gated SPECT imaging reveals normal myocardial thickening and wall motion. The left ventricular ejection fraction was normal (68%). 3. Low risk study.  Echocardiogram 08/05/2017: Normal LV systolic function, EF 94-76%, normal diastolic function. Mild aortic stenosis, moderate aortic regurgitation. Aortic valve area 1.11 cm.  Mild MR, mild TR, PA pressure 31 mmHg.  Moderate left atrial enlargement, a PFO cannot be excluded.  Abdominal aortic duplex 05/17/2018: The maximum aorta diameter is 2.63 cm (dist). Diffuse plaque observed in the distal aorta. Peak systolic velocities in the left internal iliac artery are mildly increased to 175.54 cm/s. suggestive of <50% stenosis. No AAA noted.  Carotid artery duplex 12/17/2020: Duplex suggests stenosis in the right internal carotid artery (16-49%). Duplex suggests stenosis in the right external carotid artery (<50%). Duplex suggests stenosis in the left internal carotid artery (50-69%). Antegrade right vertebral artery flow. Antegrade left vertebral artery flow. No significant change since 06/19/2020. Follow up in six months is appropriate if clinically indicated.  EKG:  EKG 06/26/2021: Normal sinus rhythm at rate of 65 bpm, left axis deviation, left anterior fascicular block.  Poor R wave progression, cannot exclude anteroseptal infarct old.  LVH.  Single PAC.  No  significant change from 07/14/2020.   Assessment     ICD-10-CM   1. Asymptomatic bilateral carotid artery stenosis  I65.23 EKG 12-Lead    2. Moderate aortic regurgitation  I35.1 PCV ECHOCARDIOGRAM COMPLETE    3. Primary hypertension  I10     4. Hypercholesteremia  E78.00     5. Cigarette smoker  F17.210      No orders of the defined types were placed in this encounter.  Medications Discontinued During This Encounter  Medication Reason   omeprazole (PRILOSEC) 40 MG capsule    Orders Placed This Encounter  Procedures   EKG 12-Lead   PCV ECHOCARDIOGRAM COMPLETE    Standing Status:   Future    Standing Expiration Date:   06/26/2022     Recommendations:   Joann Barnes  is a 81 y.o.  fairly active and independent Caucasian female patient with hypertension, hyperlipidemia, COPD with centrilobular emphysema, hypothyroidism, migraines, GERD, coronary calcification noted on the CT scan in December 2019,  presents here for follow-up of dyspnea and asymptomatic carotid stenosis.   Had peptic ulcer disease and GI bleed leading to blood transfusion in November 2022.  She has not had any recurrence.  She is presently tolerating aspirin 81 mg daily.  Extensive discussion regarding smoking cessation again, unfortunately she continues to have difficulty.  I reviewed her CT scan, the nodules in the lung size has increased.  She will need continued surveillance of these nodules due to high risk of malignancy.  Blood pressure is well controlled, Labs reviewed, lipids are also very well controlled.  Vascular exam except for bilateral carotid bruits normal.  The aortic regurgitation needs surveillance.  Systolic murmur appears to be slightly more pronounced than last year.  Reviewed the results of the carotid artery duplex, will continue 6 monthly surveillance.  Fortunately it has remained stable.  I will see her back in 1 year or sooner if there is any other problems.    Adrian Prows, MD,  [facility] 06/26/2021, 10:25 AM Office: [phone] Pager: [phone]

## 2021-06-29 DIAGNOSIS — I700 Atherosclerosis of aorta: Secondary | ICD-10-CM | POA: Diagnosis not present

## 2021-06-29 DIAGNOSIS — Z720 Tobacco use: Secondary | ICD-10-CM | POA: Diagnosis not present

## 2021-06-29 DIAGNOSIS — N1832 Chronic kidney disease, stage 3b: Secondary | ICD-10-CM | POA: Diagnosis not present

## 2021-06-29 DIAGNOSIS — M479 Spondylosis, unspecified: Secondary | ICD-10-CM | POA: Diagnosis not present

## 2021-06-29 DIAGNOSIS — F3342 Major depressive disorder, recurrent, in full remission: Secondary | ICD-10-CM | POA: Diagnosis not present

## 2021-06-29 DIAGNOSIS — Z7951 Long term (current) use of inhaled steroids: Secondary | ICD-10-CM | POA: Diagnosis not present

## 2021-06-29 DIAGNOSIS — M1611 Unilateral primary osteoarthritis, right hip: Secondary | ICD-10-CM | POA: Diagnosis not present

## 2021-06-29 DIAGNOSIS — D692 Other nonthrombocytopenic purpura: Secondary | ICD-10-CM | POA: Diagnosis not present

## 2021-06-29 DIAGNOSIS — I129 Hypertensive chronic kidney disease with stage 1 through stage 4 chronic kidney disease, or unspecified chronic kidney disease: Secondary | ICD-10-CM | POA: Diagnosis not present

## 2021-06-29 DIAGNOSIS — J449 Chronic obstructive pulmonary disease, unspecified: Secondary | ICD-10-CM | POA: Diagnosis not present

## 2021-06-30 ENCOUNTER — Other Ambulatory Visit: Payer: Self-pay

## 2021-06-30 ENCOUNTER — Encounter: Payer: Self-pay | Admitting: Internal Medicine

## 2021-06-30 ENCOUNTER — Ambulatory Visit: Payer: PPO | Admitting: Internal Medicine

## 2021-06-30 VITALS — BP 118/70 | HR 76 | Temp 97.7°F | Ht 63.0 in | Wt 136.2 lb

## 2021-06-30 DIAGNOSIS — F17200 Nicotine dependence, unspecified, uncomplicated: Secondary | ICD-10-CM

## 2021-06-30 DIAGNOSIS — J449 Chronic obstructive pulmonary disease, unspecified: Secondary | ICD-10-CM | POA: Diagnosis not present

## 2021-06-30 DIAGNOSIS — F17210 Nicotine dependence, cigarettes, uncomplicated: Secondary | ICD-10-CM

## 2021-06-30 DIAGNOSIS — R918 Other nonspecific abnormal finding of lung field: Secondary | ICD-10-CM | POA: Diagnosis not present

## 2021-06-30 MED ORDER — VARENICLINE TARTRATE 0.5 MG X 11 & 1 MG X 42 PO TBPK: ORAL_TABLET | ORAL | 0 refills | Status: DC

## 2021-06-30 NOTE — Patient Instructions (Addendum)
°  Gold stage 2 copd  --Stable lung disease but having increased cough in the last 1 year -Continue Trelegy schedule and albuterol as needed but -Take Mucinex over-the-counter on a scheduled basis for cough  Smoking  We spent 3 min quit smoking discussion Glad you want to quit again Seems chantix "allergy" was due to some other medications Seems you want to challenge with chantix again and definitely quit smoking this time  Plan  - habits to break  - smoke outdoors  - smoke with right hand  - do not smoke in car  - drink water   - reduce smoking by 1 cigarrette every 2 days with a goal quit date   -  start chantix as directed  - Days 1-3: 0.5 mg once daily  - Days 4-7: 0.5 mg twice daily  - Maintenance (? Day 8):  1 mg twice daily for 11 weeks   - if you have bad dream stop medication and call us  - take the medicaiton as instructed and after food - quit smoking  2 week s after starting chantix        Lung nodule 6.3 mll LEft lung noudle - Jan 2023 ; worse  Plan  - do CT chest without contrast Super D mid-April 2023   Follow-up Mid-April 2023 for CAT scan and smoking review and copd

## 2021-06-30 NOTE — Progress Notes (Signed)
PCP Merrilee Seashore, MD   HPI   10/22/10 Initial Consult  DYspnea: This is a new (81 year old overweight female.  Smoker 1/2 ppd minium since age 37) problem. The current episode started more than 1 year ago (Insidous onset, several years ago). The problem occurs daily. The problem has been gradually worsening (Progressively worse past few months. Unable to breahe as deep as before. Currently dyspnic walking to mail box and back. Notices it [place] yard work as well). Pertinent negatives include no abdominal pain, chest pain, claudication, coryza, ear pain, fever, headaches, hemoptysis, leg swelling, neck pain, orthopnea, PND, rash, rhinorrhea, sore throat, sputum production, swollen glands, syncope, vomiting or wheezing. The symptoms are aggravated by exercise, any activity and weather changes (heat makes dyspnea worse. Walking to mail box and back makes dyspnea worse). Associated symptoms comments: Feels she is unable to take a deep breath. Asspociated cough + but feels currently unable to expectorate. Reports normal cardiac stress test by Dr Glade Lloyd 2005 approximately. Reports normal resting annual ekg. Risk factors include smoking. She has tried ipratropium inhalers and oral steroids (is on spiriva currently along with albuterol but no relief. has been on prednisone bursts with cold and cough which has helped  always. Sh e is confused about her medications) for the symptoms. The treatment provided no relief. There is no history of allergies, aspirin allergies, asthma, bronchiolitis, CAD, chronic lung disease, COPD, DVT, a heart failure, PE, pneumonia or a recent surgery. (Cxr nov 2008 reviewed - suggests hyperinflation. CT abd lung cut  05/25/2010 - kyphotic, possible emphsyemai in lung field)   Walking 185 feet x 3 laps in office she did not desaturate. Though she denies COPD, I noticed on med list she is on spirivia and advair; I did not get a chance to question her about it >>referred to  Pul[facility] . Rehab.    OV 01/08/2011:  Followup Gold stage 1 copd and Smoking. Last OV NP started her on zyban but having same intolerance to it like chantix; nausea and dyspnea. So she quit zyban. She started off with bid regimen on zyban and was not taking it after meals. Nicotine patch made her itch and local rash. Benadryl for it made sleepy. In terms of dyspnea, compliant with spiriva and inhaled steroid. Clas 1-2 dyspnea is stable. She is willing to attend rehab. Willing to have flu shot today. No cough. Some associated baseline edema that is unchanged. No fever, no sputum.  PFT 11/09/2010>>FEV1 2.00 l/m  (92%), ratio 68, no significant change with SABA. Decreased mid flows 42%, DLCO 66%  And reduced. RV 133% and positive for air trapping    IOV 12/23/2017  Chief Complaint  Patient presents with   Consult    Referred by Dr. Merrilee Seashore. Pt is a former pt of MR last seen 04/12/11. Pt was spitting up blood and was seen in the ER last thursday, 12/15/17. Pt just finished up abx last night, 12/22/17. Pt states she was told she had pna. Pt has c/o SOB and occ. cough. Denies any CP.   81 year old female that I personally last saw 7 years ago for early stage COPD. After that lost to follow-up. She split follows up with her primary care physician. In the interim and appears she has continued to smoke. She is on triple inhaler therapy  TRELEGY n June 2019 pulmonary function test suggested Gold stage II COPD. She is here because of new onset of hemoptysis.history is gained from her and  review of the outside medical records and past chart.Outside  medical chart is from her primary care physician.it appears on 12/09/2017 while after using nico block to quit smoking she started having hemoptysis. This was mi A few days latershe saw primary care physician got antibiotic course. But the hemoptysis then got worse and 12/15/2017 she went to the emergency department where she had a CT scan of the chest that I  personally visualized and as documented below.Ruled out for pulmonary embolism has emphysema but has right upper and lower lobe airway opacification with infiltrates. No masses no ILD. She was given another course of antibiotic which he just finished yesterday/today but she still has some ongoing intermittent mild hemoptysis. Overall the intensity of hemoptysis is better by over 50% according to her history. She says she also had significant wheezing but this is also better with just antibiotics. She never got any prednisone. She started quit smoking with the last cigarette being this morning but she admits she can easily relapse.   IMPRESSION: 1.  Negative for acute pulmonary embolus. 2. Chronic lung disease with hyperinflation. Subsegmental right upper and lower lobe airway opacification, associated with peribronchial opacity in the right upper lobe compatible with bronchopneumonia. No areas of active bleeding/contrast extravasation identified. No pleural effusion. 3. Calcified coronary artery and Aortic Atherosclerosis (ICD10-I70.0).     Electronically Signed   By: Genevie Ann M.D.   On: 12/15/2017 15:39      OV 01/24/2018  Subjective:  Patient ID: Joann Barnes, female , DOB: 1940/09/05 , age 59 y.o. , MRN: [id number] , ADDRESS: [address] [place] [address]   01/24/2018 -   Chief Complaint  Patient presents with   Follow-up    Pt states she is feeling better since her last visit. States she is no longer coughing up the blood.  States she is still coughing but that is better.     HPI Joann Barnes 81 y.o. -presents for follow-up of several issues  #Cough with hemoptysis related to pneumonia process on the CT scan August 2019: Since her last visit after I gave her prednisone no further hemoptysis.  She feels her pneumonia has resolved.  She feels stable.  #Smoking she continues to smoke.  She contemplated vaping in an effort to quit smoking but after seeing medical  reports she is decided not to do it.    #Emphysema: She is on Trelegy inhaler.  Symptoms are stable.  She says she will have her high-dose flu shot with her primary care physician.  She says she has never had a pneumonia vaccine and was inquiring about it.  #New issue: On exam we have discovered a systolic murmur.  She thought last echocardiogram was in 2017.  Review of the chart shows primary care physician did this in 2019 and she has mild aortic stenosis.  OV 04/04/2018  Subjective:  Patient ID: Joann Barnes, female , DOB: 10-24-1940 , age 41 y.o. , MRN: [id number] , AD[address]ESS: [address] [place] [address]   04/04/2018 -   Chief Complaint  Patient presents with   Follow-up    She is doing better at times but she is having more mucus.     HPI Joann Barnes 81 y.o. -is here for follow-up of several issues  Emphysema: COPD CAT score is 15.  She feels stable.  She continues Trelegy.  She is up-to-date with her vaccines  Smoking: She continues to smoke but says  she will work on quitting on her own  Hemoptysis: This is resolved.  She had a CT scan for follow-up.  This shows improvement and resolution of debris.  She has a 4 mm lung nodule that would require follow-up in a year because of her continued smoking and risk for lung cancer  Lung nodule 4 mm: As discussed above this will require follow-up in December 2020  New finding: Coronary artery calcification on CT scan.  She denies any chest pain but she does have exertional dyspnea.  She does not have a cardiologist.  In fact her primary care physician was asking her about this.  She is interested in a referral    Personally visualized the CT chest and agree with the findings. Ct Chest Wo Contrast  Result Date: 04/03/2018 CLINICAL DATA:  Persistent cough pneumonia in August. EXAM: CT CHEST WITHOUT CONTRAST TECHNIQUE: Multidetector CT imaging of the chest was performed following the standard protocol without IV contrast.  COMPARISON:  12/15/2017. FINDINGS: Cardiovascular: Atherosclerotic calcification of the arterial vasculature, including three-vessel involvement of the coronary arteries and aortic valve. Heart size normal. No pericardial effusion. Mediastinum/Nodes: Mediastinal lymph nodes are not enlarged by CT size criteria. Hilar regions are difficult to evaluate without IV contrast. No axillary adenopathy. Esophagus is grossly unremarkable. Lungs/Pleura: Biapical pleuroparenchymal scarring. Interval clearing of previously seen mucoid impaction and ground-glass in the right upper and right lower lobes. A few scattered pulmonary nodules measure 4 mm or less in size, as before. No pleural fluid. Debris is seen in bronchus intermedius. Upper Abdomen: Visualized portions of the liver, adrenal glands, kidneys, spleen pancreas stomach are grossly unremarkable. No upper abdominal adenopathy. Musculoskeletal: Degenerative changes in the spine. No worrisome lytic or sclerotic lesions. Anterolisthesis of T1 on T2 with advanced secondary degenerative disc disease. IMPRESSION: 1. No acute findings. Interval clearing of mucoid impaction and ground-glass in the right upper and right lower lobes. 2. Scattered pulmonary nodules measure 4 mm or less size, stable. No follow-up needed if patient is low-risk (and has no known or suspected primary neoplasm). Non-contrast chest CT can be considered in 12 months if patient is high-risk. This recommendation follows the consensus statement: Guidelines for Management of Incidental Pulmonary Nodules Detected on CT Images: From the Fleischner Society 2017; Radiology 2017; 284:228-243. 3. Aortic atherosclerosis (ICD10-170.0). Three-vessel coronary artery calcification. Electronically Signed   By: Lorin Picket M.D.   On: 04/03/2018 10:40       OV 05/23/2019  Subjective:  Patient ID: Joann Barnes, female , DOB: 04/23/1941 , age 63 y.o. , MRN: [id number] , AD[address]ESS: [address] [place]  [place] [address]   05/23/2019 -   Chief Complaint  Patient presents with   Follow-up     HPI Joann Barnes 81 y.o. -    COPD/emphysema: She continues on Trelegy.  Symptoms are minimal and stable.  She is doing well.  Does not use oxygen  Smoking: She is unable to quit.  She tried to use Wellbutrin earlier this year or last month and had significant allergies.  Because of the significant history of allergies to drugs she is nervous to take the COVID-19 vaccine  Lung nodule: She had follow-up 1 year scan in December 2020.  The report was reviewed.  This does not show any lung nodule.  At this point in time because she is over 55 and there is no lung nodule she does not need any follow-up CT chest for the purpose of lung cancer screening.  CT chest dec 2020   Lungs/Pleura: Biapical pleuroparenchymal scarring. Centrilobular emphysema. Scattered subpleural nodular densities measure up to 3 mm in the right lower lobe, unchanged and benign. Lungs are otherwise clear. No pleural fluid. Airway is unremarkable.   Upper Abdomen: Visualized portion of the liver is unremarkable. Slight thickening both adrenal glands. Visualized portions of the kidneys, spleen, pancreas, stomach and bowel are unremarkable with exception of a moderate hiatal hernia. No upper abdominal adenopathy.   Musculoskeletal: Degenerative changes in the spine. No worrisome lytic or sclerotic lesions. Grade 1 anterolisthesis of T1 on T2 with advanced secondary degenerative disc disease.   IMPRESSION: 1. No findings to explain the patient's given symptoms. 2. Grade 1 anterolisthesis of T1 on T2 advanced degenerative disc disease. 3.  Emphysema (ICD10-J43.9). 4. Aortic atherosclerosis (ICD10-170.0). Coronary artery calcification.     Electronically Signed   By: Lorin Picket M.D. ROS - per HPI   06/03/2020  Patient presents today for 1 year follow-up emphysema/pulmoanry nodule.  Breathing has been well controlled on  Trelegy Ellipta 100. She has an occasional np cough, she finds it difficult to get mucus up. She has mucinex at home but has not started taking it. She uses albuterol once a day. She is smoking more recently. Repeat CT chest in December 2021 which showed resolution of clustered right upper lobe nodularity, new osteoid deposit around subacute displaced fracture left acromion and fracture of portion of left scapula. She has been less active since falling in September 2021. She sustained a left shoulder fracture but her right shoulder is the one that has been bothering her. She has an apt with Dr. Lorin Mercy tomorrow.    OV 06/30/2021  Subjective:  Patient ID: Joann Barnes, female , DOB: 28-Mar-1941 , age 2 y.o. , MRN: [id number] , AD[address]ESS: [address] [place] [address] PCP Merrilee Seashore, MD Patient Care Team: Merrilee Seashore, MD as PCP - General (Internal Medicine)  This Provider for this visit: Treatment Team:  Attending Provider: Brand Males, MD    06/30/2021 -   Chief Complaint  Patient presents with   Follow-up    Pt states she has been doing okay since last visit. States that she is still currently smoking.     HPI Joann Barnes 81 y.o. -  Follow-up for several issues #COPD Gold stage II: She is on Trelegy.  I personally not seen in a while.  She is reporting increased cough in the last 1 year.  Recently she had a CT scan of the chest and is no other etiology found.  I have asked her to take Mucinex along with the Trelegy.  She is compliant with her Trelegy  #Smoking: She really wants to quit smoking.  In the past she described Chantix as an allergy but she tells me this was because of another concomitant medication probably hydrochlorothiazide according to her.  She does have some crystals in the house but no suicidal or homicidal ideations.  She says she will be safe.  She wants to definitely try Chantix this time.  She did say that she uses her left hand  to smoke and she also smokes as soon as she gets up.  She smokes in the living room.  She recently got a pet dog and she does not want to smoke in front of the dog.  She is agreed to try with smoking with her right hand.  She has agreed to gradually reduce and quit.  She has  agreed to try smoking outdoors.  When she does want the Chantix to help her.  We talked about the risk benefits and limitations of Chantix.  #She has pulmonary nodules.  In May 13, 2021 the right lung nodule is slightly grown to 6 mm.    CT Chest data 05/13/21   Narrative & Impression  CLINICAL DATA:  Follow-up pulmonary nodules, history emphysema   EXAM: CT CHEST WITHOUT CONTRAST   TECHNIQUE: Multidetector CT imaging of the chest was performed following the standard protocol without IV contrast.   RADIATION DOSE REDUCTION: This exam was performed according to the departmental dose-optimization program which includes automated exposure control, adjustment of the mA and/or kV according to patient size and/or use of iterative reconstruction technique.   COMPARISON:  04/07/2020   FINDINGS: Cardiovascular: Atherosclerotic calcifications aorta, coronary arteries, and proximal great vessels. Aorta normal caliber. Heart size normal.  .   Mediastinum/Nodes: Small hiatal hernia. Esophagus normal appearance. Scattered normal size mediastinal lymph nodes without thoracic adenopathy. Base of cervical region normal appearance.   Lungs/Pleura: Hyperinflation and diffuse centrilobular emphysema. Biapical scarring greater on RIGHT. Mild atelectasis along RIGHT major fissure, new. 6 x 65mm LEFT lower lobe nodule image 125 previously 5 x 3 mm. Tiny RIGHT lung nodules peripherally unchanged. Minimal chronic atelectasis or scarring anterior LEFT upper lobe medially. No acute infiltrate, pleural effusion, or pneumothorax. No new mass/nodule. Scattered mucous plugging in the lower lobes. Posterior RIGHT diaphragmatic hernia  containing fat noted.   Upper Abdomen: Unremarkable   Musculoskeletal: Scattered degenerative disc disease changes of thoracic spine. Mild anterolisthesis at C7-T1 and T1-T2. Prior cervical fusion. Posttraumatic deformity or a LEFT acromion.   IMPRESSION: 6 x 3 mm LEFT lower lobe nodule, minimally increased in size since prior exam.   Additional tiny RIGHT lung nodules unchanged.   Small hiatal hernia.   Posterior RIGHT diaphragmatic hernia containing fat.   Scattered atherosclerotic calcifications including coronary arteries.   Aortic Atherosclerosis (ICD10-I70.0).     Electronically Signed   By: Lavonia Dana M.D.   On: 05/13/2021 13:39      No results found.    CAT COPD Symptom & Quality of Life Score (GSK trademark) 0 is no burden. 5 is highest burden 04/04/2018   Never Cough -> Cough all the time 3  No phlegm in chest -> Chest is full of phlegm 3  No chest tightness -> Chest feels very tight 0  No dyspnea for 1 flight stairs/hill -> Very dyspneic for 1 flight of stairs 5  No limitations for ADL at home -> Very limited with ADL at home 0  Confident leaving home -> Not at all confident leaving home 0  Sleep soundly -> Do not sleep soundly because of lung condition 0  Lots of Energy -> No energy at all 4  TOTAL Score (max 40)  15     PFT  PFT Results Latest Ref Rng & Units 10/10/2017  FVC-Pre L 1.63  FVC-Predicted Pre % 63  FVC-Post L 2.04  FVC-Predicted Post % 79  Pre FEV1/FVC % % 75  Post FEV1/FCV % % 73  FEV1-Pre L 1.22  FEV1-Predicted Pre % 63  FEV1-Post L 1.49       has a past medical history of Aortic stenosis, Cancer (HCC), COPD (chronic obstructive pulmonary disease) (HCC), Dyspnea, GERD (gastroesophageal reflux disease), Hematuria, Hernia, hiatal, HTN (hypertension), Hyperlipidemia, Hypothyroid, Migraine, OA (osteoarthritis), OP (osteoporosis), Other and unspecified angina pectoris, Pneumonia, and Spondylolisthesis of lumbar region.   reports  that she has been smoking cigarettes. She has a 45.00 pack-year smoking history. She has never used smokeless tobacco.  Past Surgical History:  Procedure Laterality Date   APPENDECTOMY     CATARACT EXTRACTION, BILATERAL     CERVICAL DISC ARTHROPLASTY     x 2   COLONOSCOPY     ENTEROSCOPY N/A 03/06/2021   Procedure: ENTEROSCOPY;  Surgeon: Carol Ada, MD;  Location: [facility];  Service: Endoscopy;  Laterality: N/A;   ESOPHAGOGASTRODUODENOSCOPY  07/19/00   HAND SURGERY Right 2014   HOT HEMOSTASIS N/A 03/06/2021   Procedure: HOT HEMOSTASIS (ARGON PLASMA COAGULATION/BICAP);  Surgeon: Carol Ada, MD;  Location: [facility];  Service: Endoscopy;  Laterality: N/A;   LAMINECTOMY  05/23/2017   L4-5, L5-S1    Allergies  Allergen Reactions   Bactrim [Sulfamethoxazole-Trimethoprim] Shortness Of Breath   Nicotine Anaphylaxis, Itching, Rash and Other (See Comments)    Rash and itch with patch locally; Chewing the gum closed her throat   Codeine Itching and Nausea And Vomiting   Atorvastatin Other (See Comments)    Muscle aches   Benadryl [Diphenhydramine Hcl] Other (See Comments)    Drowsiness    Chantix [Varenicline Tartrate] Hives, Itching, Nausea And Vomiting, Rash and Other (See Comments)    Mental status changes   Wellbutrin [Bupropion] Rash and Other (See Comments)    Slight rash around ankles   Zolpidem Tartrate Other (See Comments)    Bad dreams    Immunization History  Administered Date(s) Administered   Fluad Quad(high Dose 65+) 12/13/2019   Influenza Whole 01/31/2010, 01/08/2011   Influenza, High Dose Seasonal PF 05/03/2016, 02/02/2018   Influenza, Quadrivalent, Recombinant, Inj, Pf 02/10/2021   Influenza-Unspecified 12/06/2015   PFIZER(Purple Top)SARS-COV-2 Vaccination 06/16/2019, 07/07/2019   Pneumococcal Conjugate-13 01/24/2018   Unspecified SARS-COV-2 Vaccination 06/16/2019, 07/07/2019   Zoster Recombinat (Shingrix) 02/14/2018, 04/15/2018    Family History   Problem Relation Age of Onset   Heart disease Mother    Heart disease Father    Heart disease Brother    Cancer Sister        breast   Cancer Sister        lung     Current Outpatient Medications:    acetaminophen (TYLENOL) 500 MG tablet, Take 1,000 mg by mouth every 6 (six) hours as needed for mild pain or headache., Disp: , Rfl:    albuterol (ACCUNEB) 1.25 MG/3ML nebulizer solution, Take 1 ampule by nebulization 2 (two) times daily as needed for wheezing or shortness of breath., Disp: , Rfl:    amLODipine (NORVASC) 10 MG tablet, Take 1 tablet (10 mg total) by mouth daily., Disp: 90 tablet, Rfl: 3   aspirin 81 MG chewable tablet, Chew 81 mg by mouth 2 (two) times a week., Disp: , Rfl:    AZO-CRANBERRY PO, Take 2 tablets by mouth daily as needed (for urinary symptoms)., Disp: , Rfl:    calcium carbonate (TUMS - DOSED IN MG ELEMENTAL CALCIUM) 500 MG chewable tablet, Chew 1 tablet by mouth daily as needed for heartburn or indigestion., Disp: , Rfl:    Cholecalciferol (VITAMIN D3) 2000 units TABS, Take 2,000 Units by mouth daily., Disp: , Rfl:    diazepam (VALIUM) 5 MG tablet, Take 5 mg by mouth 2 (two) times daily as needed for muscle spasms., Disp: , Rfl:    escitalopram (LEXAPRO) 20 MG tablet, Take 20 mg by mouth at bedtime., Disp: , Rfl:    famotidine (PEPCID) 40 MG tablet, Take 40 mg by mouth  2 (two) times daily., Disp: , Rfl:    Fluticasone-Umeclidin-Vilant 100-62.5-25 MCG/INH AEPB, Inhale 1 puff into the lungs daily., Disp: , Rfl:    furosemide (LASIX) 20 MG tablet, Take 20-40 mg by mouth daily as needed for edema., Disp: , Rfl:    gabapentin (NEURONTIN) 300 MG capsule, Take 300 mg by mouth 3 (three) times daily., Disp: , Rfl:    levothyroxine (SYNTHROID) 25 MCG tablet, Take 25 mcg by mouth daily before breakfast., Disp: , Rfl:    PROAIR HFA 108 (90 Base) MCG/ACT inhaler, Inhale 2 puffs into the lungs every 6 (six) hours as needed for wheezing or shortness of breath., Disp: , Rfl:     rosuvastatin (CRESTOR) 20 MG tablet, TAKE 1 TABLET BY MOUTH EVERY DAY. STOP PRAVASTATIN (Patient taking differently: Take 20 mg by mouth at bedtime.), Disp: 90 tablet, Rfl: 3   varenicline (CHANTIX PAK) 0.5 MG X 11 & 1 MG X 42 tablet, Take one 0.5 mg tablet by mouth once daily for 3 days, then increase to one 0.5 mg tablet twice daily for 4 days, then increase to one 1 mg tablet twice daily., Disp: 53 tablet, Rfl: 0   zoledronic acid (RECLAST) 5 MG/100ML SOLN injection, Inject 5 mg into the vein once., Disp: , Rfl:       Objective:   Vitals:   06/30/21 1037  BP: 118/70  Pulse: 76  Temp: 97.7 F (36.5 C)  TempSrc: Oral  SpO2: 96%  Weight: 136 lb 3.2 oz (61.8 kg)  Height: 5\' 3"  (1.6 m)    Estimated body mass index is 24.13 kg/m as calculated from the following:   Height as of this encounter: 5\' 3"  (1.6 m).   Weight as of this encounter: 136 lb 3.2 oz (61.8 kg).  @WEIGHTCHANGE @  [employer]   06/30/21 1037  Weight: 136 lb 3.2 oz (61.8 kg)     Physical Exam    General: No distress. Looks well Neuro: Alert and Oriented x 3. GCS 15. Speech normal Psych: Pleasant Resp:  Barrel Chest - no.  Wheeze - no, Crackles - no, No overt respiratory distress CVS: Normal heart sounds. Murmurs - no Ext: Stigmata of Connective Tissue Disease - no HEENT: Normal upper airway. PEERL +. No post nasal drip        Assessment:       ICD-10-CM   1. Stage 2 moderate COPD by GOLD classification (HCC)  J44.9 CT Super D Chest Wo Contrast    2. Pulmonary nodules  R91.8 CT Super D Chest Wo Contrast    3. Smoking  F17.200          Plan:     Patient Instructions   Gold stage 2 copd  --Stable lung disease but having increased cough in the last 1 year -Continue Trelegy schedule and albuterol as needed but -Take Mucinex over-the-counter on a scheduled basis for cough  Smoking  We spent 3 min quit smoking discussion Glad you want to quit again Seems chantix "allergy" was due to some  other medications Seems you want to challenge with chantix again and definitely quit smoking this time  Plan  - habits to break  - smoke outdoors  - smoke with right hand  - do not smoke in car  - drink water   - reduce smoking by 1 cigarrette every 2 days with a goal quit date   -  start chantix as directed  - Days 1-3: 0.5 mg once daily  - Days  4-7: 0.5 mg twice daily  - Maintenance (? Day 8):  1 mg twice daily for 11 weeks   - if you have bad dream stop medication and call us  - take the medicaiton as instructed and after food - quit smoking  2 week s after starting chantix        Lung nodule 6.3 mll LEft lung noudle - Jan 2023 ; worse  Plan  - do CT chest without contrast Super D mid-April 2023   Follow-up Mid-April 2023 for CAT scan and smoking review and copd     SIGNATURE    Dr. Brand Males, M.D., F.C.C.P,  Pulmonary and Critical Care Medicine Staff Physician, [facility] Director - Interstitial Lung Disease  Program  [facility] [place], [place], [address]  Pager: [phone], If no answer or between  15:00h - 7:00h: call [phone] Telephone: [phone]  11:03 AM 06/30/2021

## 2021-07-01 ENCOUNTER — Other Ambulatory Visit: Payer: Self-pay | Admitting: Cardiology

## 2021-07-01 ENCOUNTER — Ambulatory Visit: Payer: PPO

## 2021-07-01 DIAGNOSIS — I351 Nonrheumatic aortic (valve) insufficiency: Secondary | ICD-10-CM

## 2021-07-01 DIAGNOSIS — I6523 Occlusion and stenosis of bilateral carotid arteries: Secondary | ICD-10-CM

## 2021-07-31 DIAGNOSIS — I129 Hypertensive chronic kidney disease with stage 1 through stage 4 chronic kidney disease, or unspecified chronic kidney disease: Secondary | ICD-10-CM | POA: Diagnosis not present

## 2021-07-31 DIAGNOSIS — E782 Mixed hyperlipidemia: Secondary | ICD-10-CM | POA: Diagnosis not present

## 2021-07-31 DIAGNOSIS — E039 Hypothyroidism, unspecified: Secondary | ICD-10-CM | POA: Diagnosis not present

## 2021-07-31 DIAGNOSIS — N1832 Chronic kidney disease, stage 3b: Secondary | ICD-10-CM | POA: Diagnosis not present

## 2021-08-11 ENCOUNTER — Ambulatory Visit: Payer: PPO

## 2021-08-11 DIAGNOSIS — I6523 Occlusion and stenosis of bilateral carotid arteries: Secondary | ICD-10-CM | POA: Diagnosis not present

## 2021-08-12 ENCOUNTER — Ambulatory Visit (INDEPENDENT_AMBULATORY_CARE_PROVIDER_SITE_OTHER)
Admission: RE | Admit: 2021-08-12 | Discharge: 2021-08-12 | Disposition: A | Discharge status: Home / Self Care | Payer: PPO | Source: Ambulatory Visit | Attending: Internal Medicine | Admitting: Internal Medicine

## 2021-08-12 DIAGNOSIS — R918 Other nonspecific abnormal finding of lung field: Secondary | ICD-10-CM

## 2021-08-12 DIAGNOSIS — I700 Atherosclerosis of aorta: Secondary | ICD-10-CM | POA: Diagnosis not present

## 2021-08-12 DIAGNOSIS — J449 Chronic obstructive pulmonary disease, unspecified: Secondary | ICD-10-CM

## 2021-08-14 ENCOUNTER — Telehealth: Payer: Self-pay | Admitting: Internal Medicine

## 2021-08-14 DIAGNOSIS — R918 Other nonspecific abnormal finding of lung field: Secondary | ICD-10-CM

## 2021-08-14 NOTE — Telephone Encounter (Signed)
? ? ?There is a new Nodule in LLL 23m ? ?Plan ? Do PET scan befoe nex visit ? ? ? ?PCV CAROTID DUPLEX (BILATERAL) ? ?Result Date: 08/14/2021 ?Carotid artery duplex 08/11/2021: Duplex suggests stenosis in the right internal carotid artery (1-15%). Duplex suggests stenosis in the right common carotid artery (<50%). Duplex suggests stenosis in the left internal carotid artery (1-15%). Duplex suggests stenosis in the left common carotid artery (<50%). There is mild to moderate heterogeneous plaque noted in bilateral carotid arteries. Antegrade right vertebral artery flow. Antegrade left vertebral artery flow. Compared to the study done on 12/17/2020, mild progression of bilateral ICA stenosis. Follow up study is appropriate if clinically indicated. ? ?CT Super D Chest Wo Contrast ? ?Result Date: 08/13/2021 ?CLINICAL DATA:  81year old female with history of pulmonary nodule. EXAM: CT CHEST WITHOUT CONTRAST TECHNIQUE: Multidetector CT imaging of the chest was performed using thin slice collimation for electromagnetic bronchoscopy planning purposes, without intravenous contrast. RADIATION DOSE REDUCTION: This exam was performed according to the departmental dose-optimization program which includes automated exposure control, adjustment of the mA and/or kV according to patient size and/or use of iterative reconstruction technique. COMPARISON:  Chest CT 05/13/2021. FINDINGS: Cardiovascular: Heart size is normal. There is no significant pericardial fluid, thickening or pericardial calcification. There is aortic atherosclerosis, as well as atherosclerosis of the great vessels of the mediastinum and the coronary arteries, including calcified atherosclerotic plaque in the left main, left anterior descending, left circumflex and right coronary arteries. Severe calcifications of the aortic valve. Mediastinum/Nodes: No pathologically enlarged mediastinal or hilar lymph nodes. Please note that accurate exclusion of hilar adenopathy  is limited on noncontrast CT scans. Small hiatal hernia. No axillary lymphadenopathy. Lungs/Pleura: In the left lower lobe there is increasing thickening of the peribronchovascular interstitium with some peribronchovascular ground-glass attenuation and architectural distortion which is increased compared to the prior study. Within this there is a new nodular area measuring 9 x 6 mm (axial image 117 of series 3), strongly favored to be of infectious or inflammatory etiology. In additional lesion at the left lung base measuring 1.0 x 0.6 cm (axial image 123 of series 3) is also likely of infectious or inflammatory etiology. Previously described previously noted left lower lobe pulmonary nodule is stable (axial image 126 of series 3) measuring 6 x 3 mm. In the central left lower lobe bronchi there are multiple filling defects, suspicious for retained mucus, although the possibility of an endobronchial lesion is not entirely excluded, best appreciated on axial image 88 of series 3. No other larger more suspicious appearing pulmonary nodules or masses are noted. No acute consolidative airspace disease. No pleural effusions. Bilateral apical pleuroparenchymal thickening and architectural distortion is noted, most compatible with areas of chronic post infectious or inflammatory scarring. Upper Abdomen: Aortic atherosclerosis. Musculoskeletal: Orthopedic fixation hardware in the lower cervical spine incidentally noted. Old healed lateral left-sided tenth rib fracture incidentally noted. There are no aggressive appearing lytic or blastic lesions noted in the visualized portions of the skeleton. IMPRESSION: 1. Areas of apparent postobstructive infection/inflammation in the left lower lobe, as above, with central left lower lobe endobronchial obstruction, potentially from retained mucus or endobronchial lesion, as detailed above. Further evaluation with bronchoscopy is suggested if clinically appropriate. 2. Aortic  atherosclerosis, in addition to left main and three-vessel coronary artery disease. 3. There are calcifications of the aortic valve. Echocardiographic correlation for evaluation of potential valvular dysfunction may be warranted if clinically indicated. Aortic Atherosclerosis (ICD10-I70.0). Electronically Signed   By: DQuillian Quince  Entrikin M.D.   On: 08/13/2021 10:20   ? ?

## 2021-08-14 NOTE — Telephone Encounter (Signed)
Called and spoke with pt letting her know the results of the CT and she verbalized understanding. Order for PET has been placed. Nothing further needed. ?

## 2021-08-23 NOTE — Progress Notes (Signed)
Please let the patient know that the carotid artery duplex appears to be improved compared to previous, no further evaluation from carotid standpoint is necessary until I feel we need to repeat the test.  I will talk to her son and she has an appointment to see me in 6 months to a year.

## 2021-08-24 NOTE — Progress Notes (Signed)
Called and spoke with patient regarding her CAD results. Patient said she does not have a son, but a sister who comes in with her.

## 2021-08-26 DIAGNOSIS — N1832 Chronic kidney disease, stage 3b: Secondary | ICD-10-CM | POA: Diagnosis not present

## 2021-08-26 DIAGNOSIS — J432 Centrilobular emphysema: Secondary | ICD-10-CM | POA: Diagnosis not present

## 2021-08-26 DIAGNOSIS — I6523 Occlusion and stenosis of bilateral carotid arteries: Secondary | ICD-10-CM | POA: Diagnosis not present

## 2021-08-26 DIAGNOSIS — I700 Atherosclerosis of aorta: Secondary | ICD-10-CM | POA: Diagnosis not present

## 2021-08-26 DIAGNOSIS — D508 Other iron deficiency anemias: Secondary | ICD-10-CM | POA: Diagnosis not present

## 2021-08-26 DIAGNOSIS — E538 Deficiency of other specified B group vitamins: Secondary | ICD-10-CM | POA: Diagnosis not present

## 2021-09-02 DIAGNOSIS — I700 Atherosclerosis of aorta: Secondary | ICD-10-CM | POA: Diagnosis not present

## 2021-09-02 DIAGNOSIS — I350 Nonrheumatic aortic (valve) stenosis: Secondary | ICD-10-CM | POA: Diagnosis not present

## 2021-09-02 DIAGNOSIS — K52831 Collagenous colitis: Secondary | ICD-10-CM | POA: Diagnosis not present

## 2021-09-02 DIAGNOSIS — I6523 Occlusion and stenosis of bilateral carotid arteries: Secondary | ICD-10-CM | POA: Diagnosis not present

## 2021-09-02 DIAGNOSIS — J432 Centrilobular emphysema: Secondary | ICD-10-CM | POA: Diagnosis not present

## 2021-09-02 DIAGNOSIS — N1832 Chronic kidney disease, stage 3b: Secondary | ICD-10-CM | POA: Diagnosis not present

## 2021-09-02 DIAGNOSIS — E782 Mixed hyperlipidemia: Secondary | ICD-10-CM | POA: Diagnosis not present

## 2021-09-02 DIAGNOSIS — E039 Hypothyroidism, unspecified: Secondary | ICD-10-CM | POA: Diagnosis not present

## 2021-09-02 DIAGNOSIS — D692 Other nonthrombocytopenic purpura: Secondary | ICD-10-CM | POA: Diagnosis not present

## 2021-09-02 DIAGNOSIS — E538 Deficiency of other specified B group vitamins: Secondary | ICD-10-CM | POA: Diagnosis not present

## 2021-09-02 DIAGNOSIS — D508 Other iron deficiency anemias: Secondary | ICD-10-CM | POA: Diagnosis not present

## 2021-09-03 ENCOUNTER — Ambulatory Visit (HOSPITAL_COMMUNITY)
Admission: RE | Admit: 2021-09-03 | Discharge: 2021-09-03 | Disposition: A | Discharge status: Home / Self Care | Payer: PPO | Source: Ambulatory Visit | Attending: Internal Medicine | Admitting: Internal Medicine

## 2021-09-03 DIAGNOSIS — R918 Other nonspecific abnormal finding of lung field: Secondary | ICD-10-CM | POA: Insufficient documentation

## 2021-09-03 DIAGNOSIS — I2510 Atherosclerotic heart disease of native coronary artery without angina pectoris: Secondary | ICD-10-CM | POA: Diagnosis not present

## 2021-09-03 DIAGNOSIS — I700 Atherosclerosis of aorta: Secondary | ICD-10-CM | POA: Diagnosis not present

## 2021-09-03 LAB — GLUCOSE, CAPILLARY: Glucose-Capillary: 95 mg/dL (ref 70–99)

## 2021-09-03 MED ORDER — FLUDEOXYGLUCOSE F - 18 (FDG) INJECTION
6.7000 | Freq: Once | INTRAVENOUS | Status: AC
  Administered 2021-09-03: 6.7 via INTRAVENOUS

## 2021-09-04 DIAGNOSIS — J449 Chronic obstructive pulmonary disease, unspecified: Secondary | ICD-10-CM | POA: Diagnosis not present

## 2021-09-04 DIAGNOSIS — Z7951 Long term (current) use of inhaled steroids: Secondary | ICD-10-CM | POA: Diagnosis not present

## 2021-09-04 DIAGNOSIS — Z720 Tobacco use: Secondary | ICD-10-CM | POA: Diagnosis not present

## 2021-09-07 ENCOUNTER — Encounter: Payer: Self-pay | Admitting: Internal Medicine

## 2021-09-07 ENCOUNTER — Ambulatory Visit: Payer: PPO | Admitting: Internal Medicine

## 2021-09-07 VITALS — BP 132/74 | HR 69 | Temp 98.2°F | Ht 63.0 in | Wt 137.8 lb

## 2021-09-07 DIAGNOSIS — R918 Other nonspecific abnormal finding of lung field: Secondary | ICD-10-CM

## 2021-09-07 DIAGNOSIS — F17200 Nicotine dependence, unspecified, uncomplicated: Secondary | ICD-10-CM

## 2021-09-07 DIAGNOSIS — J449 Chronic obstructive pulmonary disease, unspecified: Secondary | ICD-10-CM

## 2021-09-07 NOTE — Addendum Note (Signed)
Addended by: Lorretta Harp on: 09/07/2021 10:56 AM ? ? Modules accepted: Orders ? ?

## 2021-09-07 NOTE — Patient Instructions (Addendum)
?  Gold stage 2 copd ? ?--Stable lung disease  ?-Continue Trelegy schedule and albuterol as needed  ?-Take Mucinex over-the-counter as needed or scheduled per your symptoms ? ?Smoking ? ?- list chantix as allergy - nausea and vomit ? ?Plan ? - habits to break ? - smoke outdoors ? - smoke with right hand ? - do not smoke in car ? - drink water ?  - reduce smoking by 1 cigarrette every 2 days with a goal quit date ? ? ? ?Lung nodule 6.3 mll LEft lung noudle - Jan 2023 ; described as 68m In PET scan May 2022 ?[id number] nodule - new April 2023 ? ?- PET scan May 2023 shows improvement esp if the 49m21mLL Ndule ? ?Plan ? - do CT chest without contrast  in 4 months ? ? ?Follow-up ?4 monts after CT scan chest ?= CAT score at followup ?

## 2021-09-07 NOTE — Progress Notes (Signed)
? ? ? ?PCP Joann Seashore, MD ? ? ?HPI ? ? ?10/22/10 Initial Consult  ?DYspnea: This is a new (81 year old overweight female.  Smoker 1/2 ppd minium since age 74) problem. The current episode started more than 1 year ago (Insidous onset, several years ago). The problem occurs daily. The problem has been gradually worsening (Progressively worse past few months. Unable to breahe as deep as before. Currently dyspnic walking to mail box and back. Notices it [place] yard work as well). Pertinent negatives include no abdominal pain, chest pain, claudication, coryza, ear pain, fever, headaches, hemoptysis, leg swelling, neck pain, orthopnea, PND, rash, rhinorrhea, sore throat, sputum production, swollen glands, syncope, vomiting or wheezing. The symptoms are aggravated by exercise, any activity and weather changes (heat makes dyspnea worse. Walking to mail box and back makes dyspnea worse). Associated symptoms comments: Feels she is unable to take a deep breath. Asspociated cough + but feels currently unable to expectorate. Reports normal cardiac stress test by Dr Joann Barnes 2005 approximately. Reports normal resting annual ekg. Risk factors include smoking. She has tried ipratropium inhalers and oral steroids (is on spiriva currently along with albuterol but no relief. has been on prednisone bursts with cold and cough which has helped  always. Sh e is confused about her medications) for the symptoms. The treatment provided no relief. There is no history of allergies, aspirin allergies, asthma, bronchiolitis, CAD, chronic lung disease, COPD, DVT, a heart failure, PE, pneumonia or a recent surgery. (Cxr nov 2008 reviewed - suggests hyperinflation. CT abd lung cut  05/25/2010 - kyphotic, possible emphsyemai in lung field)  ? ?Walking 185 feet x 3 laps in office she did not desaturate. Though she denies COPD, I noticed on med list she is on spirivia and advair; I did not get a chance to question her about it ?>>referred to  Pulm . Rehab.  ? ? ?OV 01/08/2011:  ?Followup Gold stage 1 copd and Smoking. Last OV NP started her on zyban but having same intolerance to it like chantix; nausea and dyspnea. So she quit zyban. She started off with bid regimen on zyban and was not taking it after meals. Nicotine patch made her itch and local rash. Benadryl for it made sleepy. In terms of dyspnea, compliant with spiriva and inhaled steroid. Clas 1-2 dyspnea is stable. She is willing to attend rehab. Willing to have flu shot today. No cough. Some associated baseline edema that is unchanged. No fever, no sputum.  PFT 11/09/2010>>FEV1 2.00 l/m  (92%), ratio 68, no significant change with SABA. Decreased mid flows 42%, DLCO 66%  And reduced. RV 133% and positive for air trapping ? ? ? ?IOV 12/23/2017 ? ?Chief Complaint  ?Patient presents with  ? Consult  ?  Referred by Dr. Merrilee Barnes. Pt is a former pt of MR last seen 04/12/11. Pt was spitting up blood and was seen in the ER last thursday, 12/15/17. Pt just finished up abx last night, 12/22/17. Pt states she was told she had pna. Pt has c/o SOB and occ. cough. Denies any CP.  ? ?81 year old female that I personally last saw 7 years ago for early stage COPD. After that lost to follow-up. She split follows up with her primary care physician. In the interim and appears she has continued to smoke. She is on triple inhaler therapy  TRELEGY n June 2019 pulmonary function test suggested Gold stage II COPD. She is here because of new onset of hemoptysis.history is gained from her and  review of the outside medical records and past chart.Outside  medical chart is from her primary care physician.it appears on 12/09/2017 while after using nico block to quit smoking she started having hemoptysis. This was mi A few days latershe saw primary care physician got antibiotic course. But the hemoptysis then got worse and 12/15/2017 she went to the emergency department where she had a CT scan of the chest that I  personally visualized and as documented below.Ruled out for pulmonary embolism has emphysema but has right upper and lower lobe airway opacification with infiltrates. No masses no ILD. She was given another course of antibiotic which he just finished yesterday/today but she still has some ongoing intermittent mild hemoptysis. Overall the intensity of hemoptysis is better by over 50% according to her history. She says she also had significant wheezing but this is also better with just antibiotics. She never got any prednisone. She started quit smoking with the last cigarette being this morning but she admits she can easily relapse. ? ? ?IMPRESSION: ?1.  Negative for acute pulmonary embolus. ?2. Chronic lung disease with hyperinflation. Subsegmental right ?upper and lower lobe airway opacification, associated with ?peribronchial opacity in the right upper lobe compatible with ?bronchopneumonia. No areas of active bleeding/contrast extravasation ?identified. No pleural effusion. ?3. Calcified coronary artery and Aortic Atherosclerosis ?(ICD10-I70.0). ?  ?  ?Electronically Signed ?  By: Joann Barnes M.D. ?  On: 12/15/2017 15:39 ?  ? ? ? ?OV 01/24/2018 ? ?Subjective:  ?Patient ID: Joann Barnes, female , DOB: 1940-10-23 , age 1 y.o. , MRN: [id number] , ADDRESS: [id number] ?[place] [address] ? ? ?01/24/2018 -   ?Chief Complaint  ?Patient presents with  ? Follow-up  ?  Pt states she is feeling better since her last visit. States she is no longer coughing up the blood.  States she is still coughing but that is better.  ? ? ? ?HPI ?Joann Barnes 81 y.o. -presents for follow-up of several issues ? ?#Cough with hemoptysis related to pneumonia process on the CT scan August 2019: Since her last visit after I gave her prednisone no further hemoptysis.  She feels her pneumonia has resolved.  She feels stable. ? ?#Smoking she continues to smoke.  She contemplated vaping in an effort to quit smoking but after seeing medical  reports she is decided not to do it.   ? ?#Emphysema: She is on Trelegy inhaler.  Symptoms are stable.  She says she will have her high-dose flu shot with her primary care physician.  She says she has never had a pneumonia vaccine and was inquiring about it. ? ?#New issue: On exam we have discovered a systolic murmur.  She thought last echocardiogram was in 2017.  Review of the chart shows primary care physician did this in 2019 and she has mild aortic stenosis. ? ?OV 04/04/2018 ? ?Subjective:  ?Patient ID: Joann Barnes, female , DOB: April 03, 1941 , age 54 y.o. , MRN: [id number] , ADDRESS: [address] [place] ?[place] [address] ? ? ?04/04/2018 -   ?Chief Complaint  ?Patient presents with  ? Follow-up  ?  She is doing better at times but she is having more mucus.  ? ? ? ?HPI ?Joann Barnes 81 y.o. -is here for follow-up of several issues ? ?Emphysema: COPD CAT score is 15.  She feels stable.  She continues Trelegy.  She is up-to-date with her vaccines ? ?Smoking: She continues to smoke but says  she will work on quitting on her own ? ?Hemoptysis: This is resolved.  She had a CT scan for follow-up.  This shows improvement and resolution of debris.  She has a 4 mm lung nodule that would require follow-up in a year because of her continued smoking and risk for lung cancer ? ?Lung nodule 4 mm: As discussed above this will require follow-up in December 2020 ? ?New finding: Coronary artery calcification on CT scan.  She denies any chest pain but she does have exertional dyspnea.  She does not have a cardiologist.  In fact her primary care physician was asking her about this.  She is interested in a referral ? ? ? ?Personally visualized the CT chest and agree with the findings. ?Ct Chest Wo Contrast ? ?Result Date: 04/03/2018 ?CLINICAL DATA:  Persistent cough pneumonia in August. EXAM: CT CHEST WITHOUT CONTRAST TECHNIQUE: Multidetector CT imaging of the chest was performed following the standard protocol without IV contrast.  COMPARISON:  12/15/2017. FINDINGS: Cardiovascular: Atherosclerotic calcification of the arterial vasculature, including three-vessel involvement of the coronary arteries and aortic valve. Heart size normal. No p

## 2021-09-25 ENCOUNTER — Other Ambulatory Visit: Payer: Self-pay | Admitting: Cardiology

## 2021-09-25 DIAGNOSIS — E7800 Pure hypercholesterolemia, unspecified: Secondary | ICD-10-CM

## 2021-09-30 DIAGNOSIS — E039 Hypothyroidism, unspecified: Secondary | ICD-10-CM | POA: Diagnosis not present

## 2021-09-30 DIAGNOSIS — I129 Hypertensive chronic kidney disease with stage 1 through stage 4 chronic kidney disease, or unspecified chronic kidney disease: Secondary | ICD-10-CM | POA: Diagnosis not present

## 2021-09-30 DIAGNOSIS — E782 Mixed hyperlipidemia: Secondary | ICD-10-CM | POA: Diagnosis not present

## 2021-09-30 DIAGNOSIS — N1832 Chronic kidney disease, stage 3b: Secondary | ICD-10-CM | POA: Diagnosis not present

## 2021-10-13 ENCOUNTER — Other Ambulatory Visit: Payer: Self-pay | Admitting: Internal Medicine

## 2021-10-22 DIAGNOSIS — M479 Spondylosis, unspecified: Secondary | ICD-10-CM | POA: Diagnosis not present

## 2021-10-22 DIAGNOSIS — J449 Chronic obstructive pulmonary disease, unspecified: Secondary | ICD-10-CM | POA: Diagnosis not present

## 2021-10-22 DIAGNOSIS — I10 Essential (primary) hypertension: Secondary | ICD-10-CM | POA: Diagnosis not present

## 2021-10-22 DIAGNOSIS — M1611 Unilateral primary osteoarthritis, right hip: Secondary | ICD-10-CM | POA: Diagnosis not present

## 2021-10-22 DIAGNOSIS — Z7951 Long term (current) use of inhaled steroids: Secondary | ICD-10-CM | POA: Diagnosis not present

## 2021-11-28 ENCOUNTER — Other Ambulatory Visit: Payer: Self-pay | Admitting: Cardiology

## 2021-11-28 DIAGNOSIS — I10 Essential (primary) hypertension: Secondary | ICD-10-CM

## 2021-12-18 ENCOUNTER — Other Ambulatory Visit: Payer: Self-pay | Admitting: Cardiology

## 2021-12-18 DIAGNOSIS — I6523 Occlusion and stenosis of bilateral carotid arteries: Secondary | ICD-10-CM

## 2021-12-22 IMAGING — CT CT L SPINE W/O CM
1 of 6 series · 7 of 14 positions shown, 9 images · non-contrast
Comparison: 03/18/2020

CLINICAL DATA: Pain down both legs with numbness.

EXAM:
CT LUMBAR SPINE WITHOUT CONTRAST
TECHNIQUE: Multidetector CT imaging of the lumbar spine was performed without
intravenous contrast administration. Multiplanar CT image
reconstructions were also generated.

[Series 3: l spine soft · axial · 0.37mm/px · z∈[-258,-88]mm · 7 of 115 slices shown, 9 images]
[im 15/115  soft-tissue]
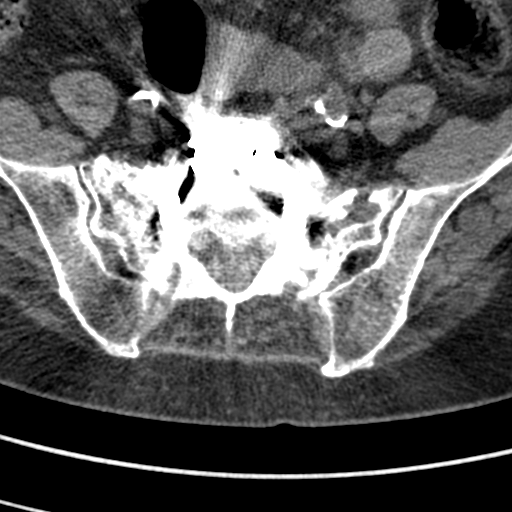
[im 15/115  bone]
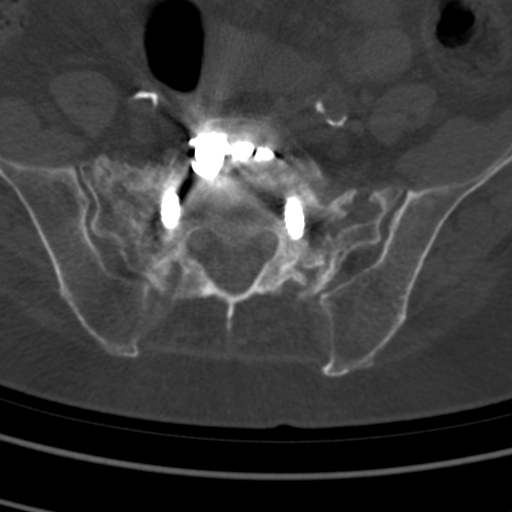
[im 29/115  bone]
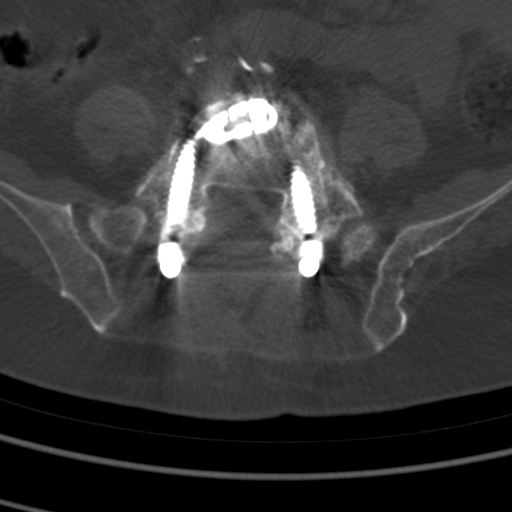
[im 43/115  bone]
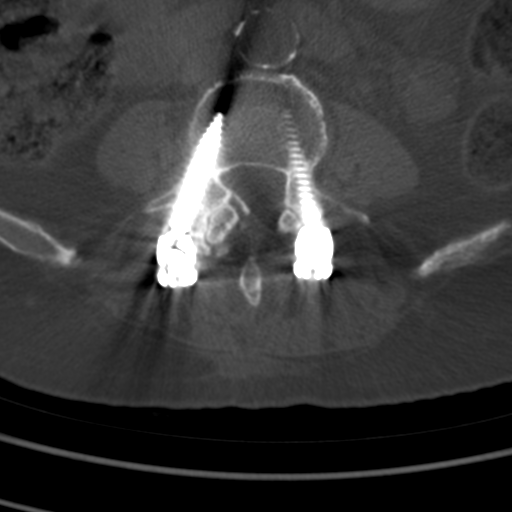
[im 58/115  bone]
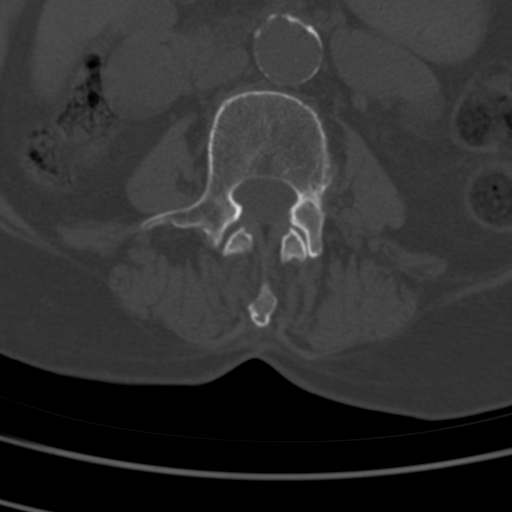
[im 72/115  soft-tissue]
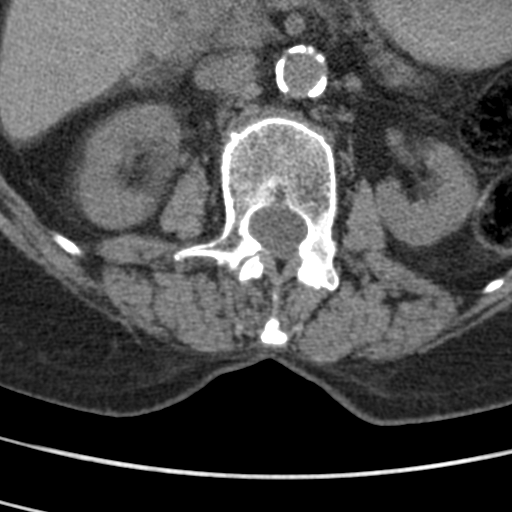
[im 72/115  bone]
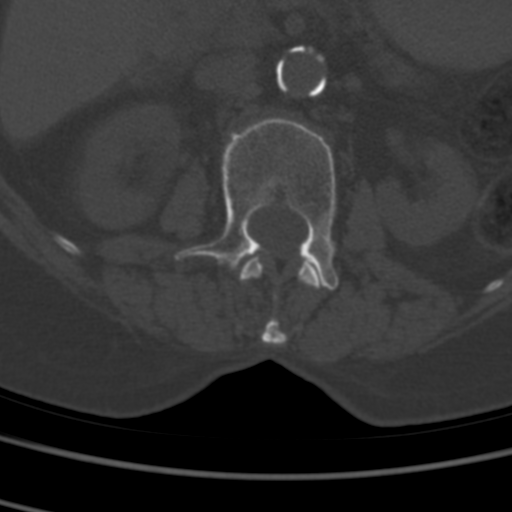
[im 86/115  bone]
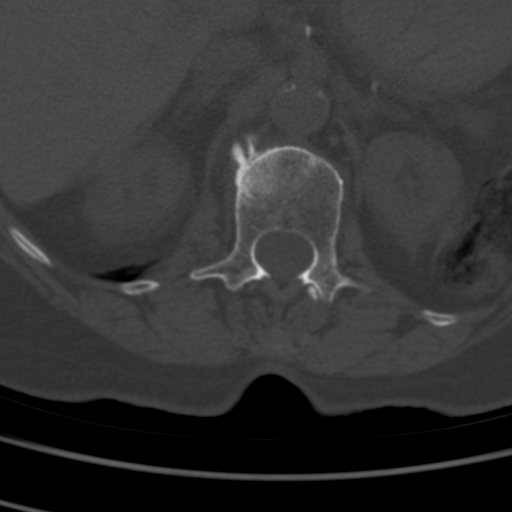
[im 100/115  bone]
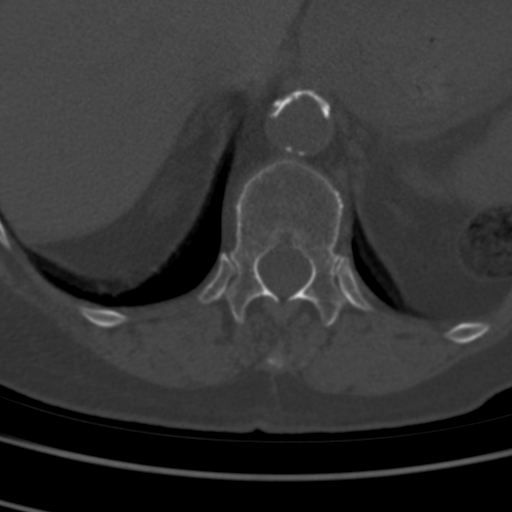

[7 of 14 positions shown; findings below may reference images not displayed]

FINDINGS: Segmentation: 5 lumbar type vertebrae

Alignment: Mild anterolisthesis at L4-5.

Vertebrae: Sacral insufficiency fractures which are nonacute and
show patchy sclerosis and lucency with bridging callus.

L4-5 and L5-S1 PLIF with solid arthrodesis.

Paraspinal and other soft tissues: No perispinal mass or
inflammation is seen. Aortic atherosclerosis.

Disc levels:

T12- L1: Rightward disc bulging and foraminal protrusion.

L1-L2: Disc collapse and endplate degeneration with partially
calcified and gas containing right foraminal herniation impinging on
the right L1 nerve root.

L2-L3: Disc narrowing and bulging.  No visible change from prior MRI

L3-L4: Disc narrowing and bulging. Facet osteoarthritis with right
more than left spurring. No visible change from prior MRI

L4-L5: PLIF and decompression.  No bony impingement

L5-S1:PLIF and decompression.  No bony impingement
IMPRESSION: 1. Nonacute sacral insufficiency fractures.
2. L4-5 and L5-S1 PLIF with solid arthrodesis.
3. Unchanged upper lumbar degenerative disease with focal severe
disc degeneration at L1-2 causing right foraminal impingement.

## 2021-12-24 ENCOUNTER — Ambulatory Visit: Payer: PPO

## 2021-12-24 DIAGNOSIS — I6523 Occlusion and stenosis of bilateral carotid arteries: Secondary | ICD-10-CM | POA: Diagnosis not present

## 2022-01-14 ENCOUNTER — Ambulatory Visit (HOSPITAL_COMMUNITY)
Admission: RE | Admit: 2022-01-14 | Discharge: 2022-01-14 | Disposition: A | Discharge status: Home / Self Care | Payer: PPO | Source: Ambulatory Visit | Attending: Internal Medicine | Admitting: Internal Medicine

## 2022-01-14 DIAGNOSIS — R918 Other nonspecific abnormal finding of lung field: Secondary | ICD-10-CM | POA: Diagnosis not present

## 2022-01-14 DIAGNOSIS — I700 Atherosclerosis of aorta: Secondary | ICD-10-CM | POA: Diagnosis not present

## 2022-01-14 DIAGNOSIS — J439 Emphysema, unspecified: Secondary | ICD-10-CM | POA: Diagnosis not present

## 2022-01-26 DIAGNOSIS — I700 Atherosclerosis of aorta: Secondary | ICD-10-CM | POA: Diagnosis not present

## 2022-01-26 DIAGNOSIS — J449 Chronic obstructive pulmonary disease, unspecified: Secondary | ICD-10-CM | POA: Diagnosis not present

## 2022-01-26 DIAGNOSIS — I350 Nonrheumatic aortic (valve) stenosis: Secondary | ICD-10-CM | POA: Diagnosis not present

## 2022-01-26 DIAGNOSIS — Z720 Tobacco use: Secondary | ICD-10-CM | POA: Diagnosis not present

## 2022-01-26 DIAGNOSIS — Z7951 Long term (current) use of inhaled steroids: Secondary | ICD-10-CM | POA: Diagnosis not present

## 2022-01-26 DIAGNOSIS — Z7982 Long term (current) use of aspirin: Secondary | ICD-10-CM | POA: Diagnosis not present

## 2022-01-26 DIAGNOSIS — Z6823 Body mass index (BMI) 23.0-23.9, adult: Secondary | ICD-10-CM | POA: Diagnosis not present

## 2022-01-26 DIAGNOSIS — I6523 Occlusion and stenosis of bilateral carotid arteries: Secondary | ICD-10-CM | POA: Diagnosis not present

## 2022-01-26 DIAGNOSIS — I10 Essential (primary) hypertension: Secondary | ICD-10-CM | POA: Diagnosis not present

## 2022-01-30 DIAGNOSIS — N1832 Chronic kidney disease, stage 3b: Secondary | ICD-10-CM | POA: Diagnosis not present

## 2022-01-30 DIAGNOSIS — E782 Mixed hyperlipidemia: Secondary | ICD-10-CM | POA: Diagnosis not present

## 2022-01-30 DIAGNOSIS — E039 Hypothyroidism, unspecified: Secondary | ICD-10-CM | POA: Diagnosis not present

## 2022-01-30 DIAGNOSIS — I129 Hypertensive chronic kidney disease with stage 1 through stage 4 chronic kidney disease, or unspecified chronic kidney disease: Secondary | ICD-10-CM | POA: Diagnosis not present

## 2022-02-03 ENCOUNTER — Other Ambulatory Visit: Payer: Self-pay | Admitting: Internal Medicine

## 2022-02-03 NOTE — Telephone Encounter (Signed)
MR, please advise on refill request.

## 2022-02-05 DIAGNOSIS — Z1231 Encounter for screening mammogram for malignant neoplasm of breast: Secondary | ICD-10-CM | POA: Diagnosis not present

## 2022-02-17 DIAGNOSIS — I6523 Occlusion and stenosis of bilateral carotid arteries: Secondary | ICD-10-CM | POA: Diagnosis not present

## 2022-02-17 DIAGNOSIS — J432 Centrilobular emphysema: Secondary | ICD-10-CM | POA: Diagnosis not present

## 2022-02-17 DIAGNOSIS — E039 Hypothyroidism, unspecified: Secondary | ICD-10-CM | POA: Diagnosis not present

## 2022-02-17 DIAGNOSIS — E538 Deficiency of other specified B group vitamins: Secondary | ICD-10-CM | POA: Diagnosis not present

## 2022-02-17 DIAGNOSIS — D508 Other iron deficiency anemias: Secondary | ICD-10-CM | POA: Diagnosis not present

## 2022-02-17 DIAGNOSIS — E782 Mixed hyperlipidemia: Secondary | ICD-10-CM | POA: Diagnosis not present

## 2022-02-17 DIAGNOSIS — N1832 Chronic kidney disease, stage 3b: Secondary | ICD-10-CM | POA: Diagnosis not present

## 2022-02-17 DIAGNOSIS — D692 Other nonthrombocytopenic purpura: Secondary | ICD-10-CM | POA: Diagnosis not present

## 2022-02-17 DIAGNOSIS — I700 Atherosclerosis of aorta: Secondary | ICD-10-CM | POA: Diagnosis not present

## 2022-02-24 DIAGNOSIS — E538 Deficiency of other specified B group vitamins: Secondary | ICD-10-CM | POA: Diagnosis not present

## 2022-02-24 DIAGNOSIS — I350 Nonrheumatic aortic (valve) stenosis: Secondary | ICD-10-CM | POA: Diagnosis not present

## 2022-02-24 DIAGNOSIS — J432 Centrilobular emphysema: Secondary | ICD-10-CM | POA: Diagnosis not present

## 2022-02-24 DIAGNOSIS — E782 Mixed hyperlipidemia: Secondary | ICD-10-CM | POA: Diagnosis not present

## 2022-02-24 DIAGNOSIS — Z0000 Encounter for general adult medical examination without abnormal findings: Secondary | ICD-10-CM | POA: Diagnosis not present

## 2022-02-24 DIAGNOSIS — M810 Age-related osteoporosis without current pathological fracture: Secondary | ICD-10-CM | POA: Diagnosis not present

## 2022-02-24 DIAGNOSIS — I6523 Occlusion and stenosis of bilateral carotid arteries: Secondary | ICD-10-CM | POA: Diagnosis not present

## 2022-02-24 DIAGNOSIS — D500 Iron deficiency anemia secondary to blood loss (chronic): Secondary | ICD-10-CM | POA: Diagnosis not present

## 2022-02-24 DIAGNOSIS — E039 Hypothyroidism, unspecified: Secondary | ICD-10-CM | POA: Diagnosis not present

## 2022-02-24 DIAGNOSIS — Z23 Encounter for immunization: Secondary | ICD-10-CM | POA: Diagnosis not present

## 2022-02-24 DIAGNOSIS — I700 Atherosclerosis of aorta: Secondary | ICD-10-CM | POA: Diagnosis not present

## 2022-02-24 DIAGNOSIS — N1832 Chronic kidney disease, stage 3b: Secondary | ICD-10-CM | POA: Diagnosis not present

## 2022-03-01 DIAGNOSIS — L820 Inflamed seborrheic keratosis: Secondary | ICD-10-CM | POA: Diagnosis not present

## 2022-04-01 DIAGNOSIS — I129 Hypertensive chronic kidney disease with stage 1 through stage 4 chronic kidney disease, or unspecified chronic kidney disease: Secondary | ICD-10-CM | POA: Diagnosis not present

## 2022-04-01 DIAGNOSIS — N1832 Chronic kidney disease, stage 3b: Secondary | ICD-10-CM | POA: Diagnosis not present

## 2022-04-01 DIAGNOSIS — E782 Mixed hyperlipidemia: Secondary | ICD-10-CM | POA: Diagnosis not present

## 2022-04-01 DIAGNOSIS — E039 Hypothyroidism, unspecified: Secondary | ICD-10-CM | POA: Diagnosis not present

## 2022-04-05 DIAGNOSIS — H40013 Open angle with borderline findings, low risk, bilateral: Secondary | ICD-10-CM | POA: Diagnosis not present

## 2022-04-05 DIAGNOSIS — H43822 Vitreomacular adhesion, left eye: Secondary | ICD-10-CM | POA: Diagnosis not present

## 2022-04-05 DIAGNOSIS — H524 Presbyopia: Secondary | ICD-10-CM | POA: Diagnosis not present

## 2022-04-05 DIAGNOSIS — H26491 Other secondary cataract, right eye: Secondary | ICD-10-CM | POA: Diagnosis not present

## 2022-04-05 DIAGNOSIS — H04123 Dry eye syndrome of bilateral lacrimal glands: Secondary | ICD-10-CM | POA: Diagnosis not present

## 2022-04-15 DIAGNOSIS — L570 Actinic keratosis: Secondary | ICD-10-CM | POA: Diagnosis not present

## 2022-04-15 DIAGNOSIS — I872 Venous insufficiency (chronic) (peripheral): Secondary | ICD-10-CM | POA: Diagnosis not present

## 2022-04-15 DIAGNOSIS — D485 Neoplasm of uncertain behavior of skin: Secondary | ICD-10-CM | POA: Diagnosis not present

## 2022-04-15 DIAGNOSIS — C44729 Squamous cell carcinoma of skin of left lower limb, including hip: Secondary | ICD-10-CM | POA: Diagnosis not present

## 2022-04-17 ENCOUNTER — Other Ambulatory Visit: Payer: Self-pay | Admitting: Internal Medicine

## 2022-04-29 DIAGNOSIS — T8140XA Infection following a procedure, unspecified, initial encounter: Secondary | ICD-10-CM | POA: Diagnosis not present

## 2022-04-30 DIAGNOSIS — F17200 Nicotine dependence, unspecified, uncomplicated: Secondary | ICD-10-CM | POA: Diagnosis not present

## 2022-04-30 DIAGNOSIS — Z6823 Body mass index (BMI) 23.0-23.9, adult: Secondary | ICD-10-CM | POA: Diagnosis not present

## 2022-04-30 DIAGNOSIS — Z7951 Long term (current) use of inhaled steroids: Secondary | ICD-10-CM | POA: Diagnosis not present

## 2022-04-30 DIAGNOSIS — J449 Chronic obstructive pulmonary disease, unspecified: Secondary | ICD-10-CM | POA: Diagnosis not present

## 2022-04-30 DIAGNOSIS — I10 Essential (primary) hypertension: Secondary | ICD-10-CM | POA: Diagnosis not present

## 2022-04-30 DIAGNOSIS — E785 Hyperlipidemia, unspecified: Secondary | ICD-10-CM | POA: Diagnosis not present

## 2022-05-04 DIAGNOSIS — C44729 Squamous cell carcinoma of skin of left lower limb, including hip: Secondary | ICD-10-CM | POA: Diagnosis not present

## 2022-05-04 DIAGNOSIS — I872 Venous insufficiency (chronic) (peripheral): Secondary | ICD-10-CM | POA: Diagnosis not present

## 2022-05-21 ENCOUNTER — Other Ambulatory Visit: Payer: Self-pay | Admitting: Internal Medicine

## 2022-05-21 NOTE — Telephone Encounter (Signed)
Please advise on refill request

## 2022-06-02 DIAGNOSIS — N1832 Chronic kidney disease, stage 3b: Secondary | ICD-10-CM | POA: Diagnosis not present

## 2022-06-02 DIAGNOSIS — I129 Hypertensive chronic kidney disease with stage 1 through stage 4 chronic kidney disease, or unspecified chronic kidney disease: Secondary | ICD-10-CM | POA: Diagnosis not present

## 2022-06-02 DIAGNOSIS — E039 Hypothyroidism, unspecified: Secondary | ICD-10-CM | POA: Diagnosis not present

## 2022-06-02 DIAGNOSIS — E782 Mixed hyperlipidemia: Secondary | ICD-10-CM | POA: Diagnosis not present

## 2022-06-21 DIAGNOSIS — M810 Age-related osteoporosis without current pathological fracture: Secondary | ICD-10-CM | POA: Diagnosis not present

## 2022-06-28 ENCOUNTER — Ambulatory Visit: Payer: PPO | Admitting: Cardiology

## 2022-06-28 ENCOUNTER — Encounter: Payer: Self-pay | Admitting: Cardiology

## 2022-06-28 VITALS — BP 130/52 | HR 62 | Resp 16 | Ht 63.0 in | Wt 132.8 lb

## 2022-06-28 DIAGNOSIS — E7800 Pure hypercholesterolemia, unspecified: Secondary | ICD-10-CM | POA: Diagnosis not present

## 2022-06-28 DIAGNOSIS — I6523 Occlusion and stenosis of bilateral carotid arteries: Secondary | ICD-10-CM | POA: Diagnosis not present

## 2022-06-28 DIAGNOSIS — I351 Nonrheumatic aortic (valve) insufficiency: Secondary | ICD-10-CM | POA: Diagnosis not present

## 2022-06-28 DIAGNOSIS — I10 Essential (primary) hypertension: Secondary | ICD-10-CM

## 2022-06-28 NOTE — Progress Notes (Signed)
Primary Physician/Referring:  Merrilee Seashore, MD  Patient ID: Joann Barnes, female    DOB: 1940/12/11, 82 y.o.   MRN: [id number]  Chief Complaint  Patient presents with   orthostatics   Follow-up dizziness and carotid stenosis    6 month   HPI:    Joann Barnes  is a 82 y.o.  fairly active and independent Caucasian female patient with hypertension, hyperlipidemia, stage IIIb CKD,  GI bleed leading to blood transfusion in November 2022, COPD with centrilobular emphysema, hypothyroidism, migraines, GERD, coronary calcification noted on the CT scan in December 2019,  presents here for follow-up of dyspnea and asymptomatic carotid stenosis.   She is presently otherwise doing well, and denies any worsening leg edema, symptoms of claudication, chest pain or palpitations.  She does have chronic dyspnea and cough.  Past Medical History:  Diagnosis Date   Aortic stenosis    Mild AS 02/2016 echo   Cancer ([place])    skin   COPD (chronic obstructive pulmonary disease) (HCC)    Dyspnea    GERD (gastroesophageal reflux disease)    Hematuria    Hernia, hiatal    HTN (hypertension)    Hyperlipidemia    Hypothyroid    Migraine    OA (osteoarthritis)    OP (osteoporosis)    Other and unspecified angina pectoris    02/2016 - had non-ischemic stress test   Pneumonia    Spondylolisthesis of lumbar region    Past Surgical History:  Procedure Laterality Date   APPENDECTOMY     CATARACT EXTRACTION, BILATERAL     CERVICAL DISC ARTHROPLASTY     x 2   COLONOSCOPY     ENTEROSCOPY N/A 03/06/2021   Procedure: ENTEROSCOPY;  Surgeon: Carol Ada, MD;  Location: [facility];  Service: Endoscopy;  Laterality: N/A;   ESOPHAGOGASTRODUODENOSCOPY  07/19/00   HAND SURGERY Right 2014   HOT HEMOSTASIS N/A 03/06/2021   Procedure: HOT HEMOSTASIS (ARGON PLASMA COAGULATION/BICAP);  Surgeon: Carol Ada, MD;  Location: [facility];  Service: Endoscopy;  Laterality: N/A;   LAMINECTOMY  05/23/2017    L4-5, L5-S1   Social History   Tobacco Use   Smoking status: Former    Packs/day: 1.00    Years: 45.00    Total pack years: 45.00    Types: Cigarettes   Smokeless tobacco: Never   Tobacco comments:    0.5ppd as of 09/07/21 ep  Substance Use Topics   Alcohol use: No  Marital Status: Widowed    ROS  Review of Systems  Cardiovascular:  Positive for dyspnea on exertion. Negative for chest pain and leg swelling.  Respiratory:  Positive for cough (chronic).    Objective  Blood pressure (!) 130/52, pulse 62, resp. rate 16, height '5\' 3"'$  (1.6 m), weight 132 lb 12.8 oz (60.2 kg), SpO2 97 %. Body mass index is 23.52 kg/m.    06/28/2022   10:08 AM 09/07/2021   10:20 AM 06/30/2021   10:37 AM  Vitals with BMI  Height '5\' 3"'$  '5\' 3"'$  '5\' 3"'$   Weight 132 lbs 13 oz 137 lbs 13 oz 136 lbs 3 oz  BMI 23.53 [id number]  Systolic [id number]  Diastolic 52 74 70  Pulse 62 69 76    Physical Exam Constitutional:      General: She is not in acute distress.    Appearance: She is well-developed.  Neck:     Thyroid: No thyromegaly.     Vascular: No JVD.  Cardiovascular:  Rate and Rhythm: Normal rate and regular rhythm.     Pulses: Intact distal pulses.          Carotid pulses are  on the right side with bruit and  on the left side with bruit.      Dorsalis pedis pulses are 2+ on the right side and 1+ on the left side.       Posterior tibial pulses are 0 on the right side and 0 on the left side.     Heart sounds: S1 normal and S2 normal. Murmur heard.     Midsystolic murmur is present with a grade of 3/6 at the upper right sternal border and apex radiating to the neck.     No gallop.  Pulmonary:     Effort: Pulmonary effort is normal.     Breath sounds: Rhonchi (scattered bilateral) present.  Abdominal:     General: Bowel sounds are normal.     Palpations: Abdomen is soft.  Musculoskeletal:     Right lower leg: No edema.     Left lower leg: No edema.    Radiology: No results  found.  Laboratory examination:   Lab Results  Component Value Date   NA 141 03/06/2021   K 3.9 03/06/2021   CO2 22 02/05/2021   GLUCOSE 88 03/06/2021   BUN 18 03/06/2021   CREATININE 1.20 (H) 03/06/2021   CALCIUM 8.9 02/05/2021   GFRNONAA 38 (L) 02/05/2021        Latest Ref Rng & Units 03/06/2021   10:50 AM 02/05/2021    9:00 AM 02/03/2021    2:04 PM  CMP  Glucose 70 - 99 mg/dL 88  102  109   BUN 8 - 23 mg/dL '18  16  12   '$ Creatinine 0.44 - 1.00 mg/dL 1.20  1.39  1.41   Sodium 135 - 145 mmol/L 141  139  138   Potassium 3.5 - 5.1 mmol/L 3.9  3.8  4.1   Chloride 98 - 111 mmol/L 106  107  106   CO2 22 - 32 mmol/L  22  24   Calcium 8.9 - 10.3 mg/dL  8.9  9.1   Total Protein 6.5 - 8.1 g/dL  5.9    Total Bilirubin 0.3 - 1.2 mg/dL  0.3    Alkaline Phos 38 - 126 U/L  41    AST 15 - 41 U/L  13    ALT 0 - 44 U/L  7        Latest Ref Rng & Units 03/06/2021   10:50 AM 02/05/2021    9:00 AM 02/03/2021    2:04 PM  CBC  WBC 4.0 - 10.5 K/uL  7.5  8.6   Hemoglobin 12.0 - 15.0 g/dL 11.6  6.6  6.8  C  Hematocrit 36.0 - 46.0 % 34.0  22.3  23.3   Platelets 150 - 400 K/uL  348  340     C Corrected result     External labs:  Cholesterol, total 132.000 m 02/03/2021 HDL 54.000 mg 02/03/2021 LDL 77.000 mg 09/12/2020 Triglycerides 93.000 mg 02/17/2022  A1C 5.500 % 02/17/2022  Labs 04/08/2021:  Hb 11.1/HCT 34.3, platelets 198, normal indicis.  Total cholesterol 132, triglycerides 93, HDL 54, LDL 59.  TSH normal at 2.59.   Medications    Current Outpatient Medications:    acetaminophen (TYLENOL) 500 MG tablet, Take 1,000 mg by mouth every 6 (six) hours as needed for mild pain or headache., Disp: , Rfl:  albuterol (ACCUNEB) 1.25 MG/3ML nebulizer solution, Take 1 ampule by nebulization 2 (two) times daily as needed for wheezing or shortness of breath., Disp: , Rfl:    amLODipine (NORVASC) 5 MG tablet, Take 5 mg by mouth daily., Disp: , Rfl:    aspirin 81 MG chewable tablet, Chew 81  mg by mouth 2 (two) times a week., Disp: , Rfl:    AZO-CRANBERRY PO, Take 2 tablets by mouth daily as needed (for urinary symptoms)., Disp: , Rfl:    calcium carbonate (TUMS - DOSED IN MG ELEMENTAL CALCIUM) 500 MG chewable tablet, Chew 1 tablet by mouth daily as needed for heartburn or indigestion., Disp: , Rfl:    Cholecalciferol (VITAMIN D3) 2000 units TABS, Take 2,000 Units by mouth daily., Disp: , Rfl:    escitalopram (LEXAPRO) 20 MG tablet, Take 20 mg by mouth at bedtime., Disp: , Rfl:    famotidine (PEPCID) 40 MG tablet, Take 40 mg by mouth 2 (two) times daily., Disp: , Rfl:    Fluticasone-Umeclidin-Vilant 100-62.5-25 MCG/INH AEPB, Inhale 1 puff into the lungs daily., Disp: , Rfl:    furosemide (LASIX) 20 MG tablet, Take 20-40 mg by mouth daily as needed for edema., Disp: , Rfl:    gabapentin (NEURONTIN) 300 MG capsule, Take 300 mg by mouth 3 (three) times daily., Disp: , Rfl:    levothyroxine (SYNTHROID) 25 MCG tablet, Take 25 mcg by mouth daily before breakfast., Disp: , Rfl:    pravastatin (PRAVACHOL) 80 MG tablet, Take 80 mg by mouth every evening., Disp: , Rfl:    PROAIR HFA 108 (90 Base) MCG/ACT inhaler, Inhale 2 puffs into the lungs every 6 (six) hours as needed for wheezing or shortness of breath., Disp: , Rfl:    zoledronic acid (RECLAST) 5 MG/100ML SOLN injection, Inject 5 mg into the vein once., Disp: , Rfl:    diazepam (VALIUM) 5 MG tablet, Take 5 mg by mouth 2 (two) times daily as needed for muscle spasms. (Patient not taking: Reported on 06/28/2022), Disp: , Rfl:     Cardiac Studies:   CT chest without contrast 05/13/2021: Cardiovascular: Atherosclerotic calcifications aorta, coronary arteries, and proximal great vessels. Aorta normal caliber. Heart size normal. Minimal pericardial effusion. Lungs/Pleura: Hyperinflation and diffuse centrilobular emphysema. Biapical scarring greater on RIGHT. Mild atelectasis along RIGHT major fissure.   Lexiscan myoview stress test  04/24/2018: 1. Lexiscan stress test was performed. Exercise capacity was not assessed. Stress symptoms included dyspnea, flushing, dizziness. Resting blood pressure 184/80 mmHg, peak effect blood pressure 140/70 mmHg. The resting and stress electrocardiogram demonstrated normal sinus rhythm, normal resting conduction, no resting arrhythmias, normal rest repolarization, and poor R wave progression. Stress EKG is non diagnostic for ischemia as it is a pharmacologic stress. 2. The overall quality of the study is excellent. There is no evidence of abnormal lung activity. Stress and rest SPECT images demonstrate homogeneous tracer distribution throughout the myocardium. Gated SPECT imaging reveals normal myocardial thickening and wall motion. The left ventricular ejection fraction was normal (68%). 3. Low risk study.  Abdominal aortic duplex 05/17/2018: The maximum aorta diameter is 2.63 cm (dist). Diffuse plaque observed in the distal aorta. Peak systolic velocities in the left internal iliac artery are mildly increased to 175.54 cm/s. suggestive of <50% stenosis. No AAA noted.  Carotid artery duplex 12/24/2021: Duplex suggests stenosis in the right internal carotid artery (1-15%). Duplex suggests stenosis in the right external carotid artery (<50%). Duplex suggests stenosis in the left internal carotid artery (1-15%). Mild heterogenous plaque  noted in bilateral carotid arteries. Antegrade right vertebral artery flow. Antegrade left vertebral artery flow. No significant change from prior study 08/11/2021.  PCV ECHOCARDIOGRAM COMPLETE 07/01/2021  Narrative Echocardiogram 07/01/2021: Normal LV systolic function with visual EF 60-65%. Left ventricle cavity is normal in size. Normal left ventricular wall thickness. Normal global wall motion. Doppler evidence of grade II (pseudonormal) diastolic dysfunction, elevated LAP. Left atrial cavity is mildly dilated. Mild (Grade I) aortic  regurgitation. Moderate aortic stenosis. AV Pk Grad of 38.91 mmHg, AV Mean Grad of 24 mmHg, AV Pk Vel of 3.11 m/s, AVA (VTI) calculated area of 1.2 cm^2, DI 0.40. Mild (Grade I) mitral regurgitation. Mild tricuspid regurgitation. No evidence of pulmonary hypertension. RVSP measures 34 mmHg. Compared to study 08/05/2017 Mild AS is now moderate and moderate AR is now mild. Normal diastolic function is now [id number] with elevated LAP.    EKG:  EKG 06/28/2022: Normal sinus rhythm with a rate of 60 bpm, left axis deviation, left anterior fascicular block.  IVCD, compared to 06/26/2021, no significant change.  LVH.  No evidence of ischemia.   Assessment     ICD-10-CM   1. Asymptomatic bilateral carotid artery stenosis  I65.23 EKG 12-Lead    2. Moderate aortic regurgitation  I35.1 PCV ECHOCARDIOGRAM COMPLETE    3. Primary hypertension  I10     4. Hypercholesteremia  E78.00      No orders of the defined types were placed in this encounter.  Medications Discontinued During This Encounter  Medication Reason   amLODipine (NORVASC) 10 MG tablet Dose change   rosuvastatin (CRESTOR) 20 MG tablet Change in therapy    Orders Placed This Encounter  Procedures   EKG 12-Lead   PCV ECHOCARDIOGRAM COMPLETE    Standing Status:   Future    Standing Expiration Date:   06/29/2023     Recommendations:   ANJELLICA GREENLAND  is a 82 y.o.  fairly active and independent Caucasian female patient with hypertension, hyperlipidemia, stage IIIb CKD,  GI bleed leading to blood transfusion in November 2022, COPD with centrilobular emphysema, hypothyroidism, migraines, GERD, coronary calcification noted on the CT scan in December 2019,  presents here for follow-up of dyspnea and asymptomatic carotid stenosis.   1. Asymptomatic bilateral carotid artery stenosis Repeat carotid artery duplex reveals very minimal bilateral carotid artery stenosis, heterogenous plaque, she is presently on aspirin and also on pravastatin 80 mg,  lipids being managed by PCP, she could not tolerate atorvastatin.  If LDL is not <70, could consider addition of Zetia.  She has also quit smoking and she is when trying to completely quit vaping flavored vapes which does not have any nicotine.  2. Moderate aortic regurgitation Moderate aortic regurgitation, mild aortic stenosis and or vice versa has remained stable on physical exam.  Do not think she needs repeat echocardiogram for this year, could consider repeating her echocardiogram next year on a follow-up.  No clinical evidence of heart failure.  3. Primary hypertension Blood pressure is now well-controlled.  Continue present medications.  4. Hypercholesteremia Lipids being managed by PCP as dictated above, I reviewed her external labs, goal LDL <70.  I will see him back in a year with repeat echocardiogram.  I do not think she needs repeat carotid artery duplex although she has very prominent carotid artery bruit which I suspect is related to conducted sounds from mild to moderate aortic stenosis.     Adrian Prows, MD, [facility] 06/28/2022, 11:09 AM Office: [phone] Pager: [phone]

## 2022-07-01 DIAGNOSIS — I129 Hypertensive chronic kidney disease with stage 1 through stage 4 chronic kidney disease, or unspecified chronic kidney disease: Secondary | ICD-10-CM | POA: Diagnosis not present

## 2022-07-01 DIAGNOSIS — N1832 Chronic kidney disease, stage 3b: Secondary | ICD-10-CM | POA: Diagnosis not present

## 2022-07-01 DIAGNOSIS — E782 Mixed hyperlipidemia: Secondary | ICD-10-CM | POA: Diagnosis not present

## 2022-07-01 DIAGNOSIS — E039 Hypothyroidism, unspecified: Secondary | ICD-10-CM | POA: Diagnosis not present

## 2022-07-15 DIAGNOSIS — I6523 Occlusion and stenosis of bilateral carotid arteries: Secondary | ICD-10-CM | POA: Diagnosis not present

## 2022-07-15 DIAGNOSIS — L570 Actinic keratosis: Secondary | ICD-10-CM | POA: Diagnosis not present

## 2022-07-15 DIAGNOSIS — E782 Mixed hyperlipidemia: Secondary | ICD-10-CM | POA: Diagnosis not present

## 2022-07-15 DIAGNOSIS — E039 Hypothyroidism, unspecified: Secondary | ICD-10-CM | POA: Diagnosis not present

## 2022-07-15 DIAGNOSIS — E538 Deficiency of other specified B group vitamins: Secondary | ICD-10-CM | POA: Diagnosis not present

## 2022-07-15 DIAGNOSIS — Z08 Encounter for follow-up examination after completed treatment for malignant neoplasm: Secondary | ICD-10-CM | POA: Diagnosis not present

## 2022-07-15 DIAGNOSIS — Z85828 Personal history of other malignant neoplasm of skin: Secondary | ICD-10-CM | POA: Diagnosis not present

## 2022-07-15 DIAGNOSIS — L814 Other melanin hyperpigmentation: Secondary | ICD-10-CM | POA: Diagnosis not present

## 2022-07-15 DIAGNOSIS — L821 Other seborrheic keratosis: Secondary | ICD-10-CM | POA: Diagnosis not present

## 2022-07-22 DIAGNOSIS — N1832 Chronic kidney disease, stage 3b: Secondary | ICD-10-CM | POA: Diagnosis not present

## 2022-07-22 DIAGNOSIS — I6523 Occlusion and stenosis of bilateral carotid arteries: Secondary | ICD-10-CM | POA: Diagnosis not present

## 2022-07-22 DIAGNOSIS — E782 Mixed hyperlipidemia: Secondary | ICD-10-CM | POA: Diagnosis not present

## 2022-07-22 DIAGNOSIS — J432 Centrilobular emphysema: Secondary | ICD-10-CM | POA: Diagnosis not present

## 2022-07-22 DIAGNOSIS — D692 Other nonthrombocytopenic purpura: Secondary | ICD-10-CM | POA: Diagnosis not present

## 2022-07-22 DIAGNOSIS — D500 Iron deficiency anemia secondary to blood loss (chronic): Secondary | ICD-10-CM | POA: Diagnosis not present

## 2022-07-22 DIAGNOSIS — I350 Nonrheumatic aortic (valve) stenosis: Secondary | ICD-10-CM | POA: Diagnosis not present

## 2022-07-22 DIAGNOSIS — E039 Hypothyroidism, unspecified: Secondary | ICD-10-CM | POA: Diagnosis not present

## 2022-07-22 DIAGNOSIS — I700 Atherosclerosis of aorta: Secondary | ICD-10-CM | POA: Diagnosis not present

## 2022-07-22 DIAGNOSIS — M810 Age-related osteoporosis without current pathological fracture: Secondary | ICD-10-CM | POA: Diagnosis not present

## 2022-07-22 DIAGNOSIS — E538 Deficiency of other specified B group vitamins: Secondary | ICD-10-CM | POA: Diagnosis not present

## 2022-07-22 DIAGNOSIS — K52831 Collagenous colitis: Secondary | ICD-10-CM | POA: Diagnosis not present

## 2022-10-01 DIAGNOSIS — N189 Chronic kidney disease, unspecified: Secondary | ICD-10-CM | POA: Diagnosis not present

## 2022-10-01 DIAGNOSIS — N184 Chronic kidney disease, stage 4 (severe): Secondary | ICD-10-CM | POA: Diagnosis not present

## 2022-10-01 DIAGNOSIS — I129 Hypertensive chronic kidney disease with stage 1 through stage 4 chronic kidney disease, or unspecified chronic kidney disease: Secondary | ICD-10-CM | POA: Diagnosis not present

## 2022-10-01 DIAGNOSIS — D631 Anemia in chronic kidney disease: Secondary | ICD-10-CM | POA: Diagnosis not present

## 2022-10-01 DIAGNOSIS — R609 Edema, unspecified: Secondary | ICD-10-CM | POA: Diagnosis not present

## 2022-10-01 DIAGNOSIS — N1832 Chronic kidney disease, stage 3b: Secondary | ICD-10-CM | POA: Diagnosis not present

## 2022-10-01 DIAGNOSIS — E785 Hyperlipidemia, unspecified: Secondary | ICD-10-CM | POA: Diagnosis not present

## 2022-10-01 DIAGNOSIS — I350 Nonrheumatic aortic (valve) stenosis: Secondary | ICD-10-CM | POA: Diagnosis not present

## 2022-10-01 DIAGNOSIS — E782 Mixed hyperlipidemia: Secondary | ICD-10-CM | POA: Diagnosis not present

## 2022-10-01 DIAGNOSIS — N2581 Secondary hyperparathyroidism of renal origin: Secondary | ICD-10-CM | POA: Diagnosis not present

## 2022-10-01 DIAGNOSIS — E039 Hypothyroidism, unspecified: Secondary | ICD-10-CM | POA: Diagnosis not present

## 2022-10-04 ENCOUNTER — Other Ambulatory Visit: Payer: Self-pay | Admitting: Cardiology

## 2022-10-04 DIAGNOSIS — H40013 Open angle with borderline findings, low risk, bilateral: Secondary | ICD-10-CM | POA: Diagnosis not present

## 2022-10-04 DIAGNOSIS — E7800 Pure hypercholesterolemia, unspecified: Secondary | ICD-10-CM

## 2022-10-04 IMAGING — CT CT CHEST W/O CM
2 of 4 series · 15 of 36 positions shown, 18 images · non-contrast
Comparison: 04/07/2020

CLINICAL DATA: Follow-up pulmonary nodules, history emphysema



[Series 2: thorax · axial · 0.62mm/px · z∈[-340,-76]mm · 12 of 156 slices shown, 15 images]
[im 12/156  mediastinal]
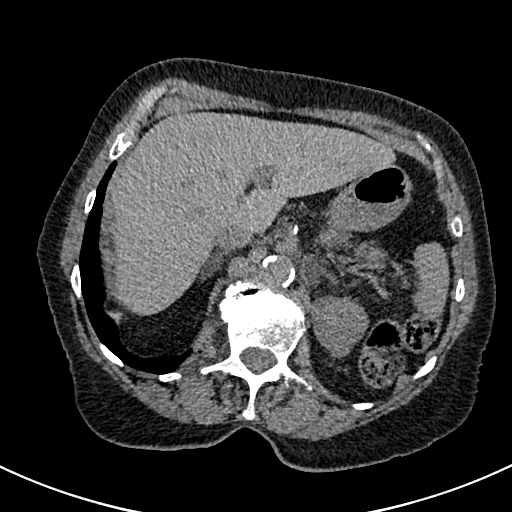
[im 12/156  lung]
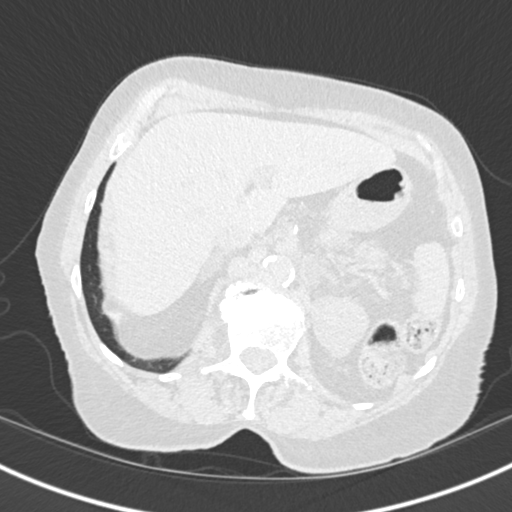
[im 24/156  lung]
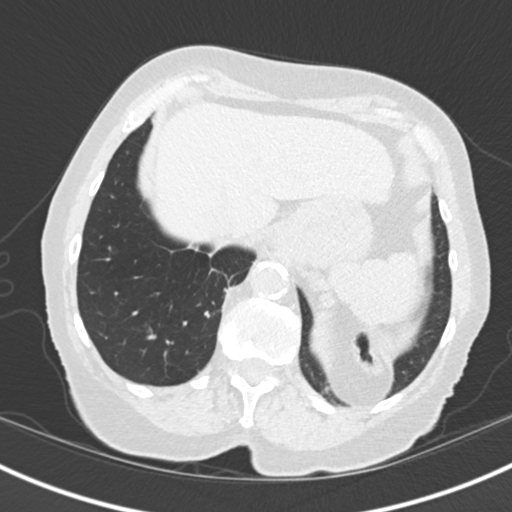
[im 36/156  lung]
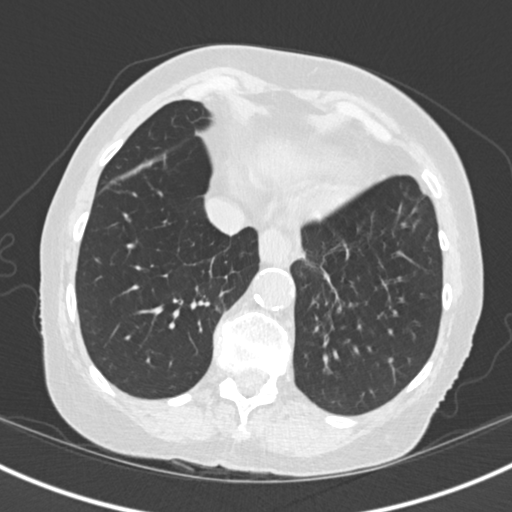
[im 48/156  lung]
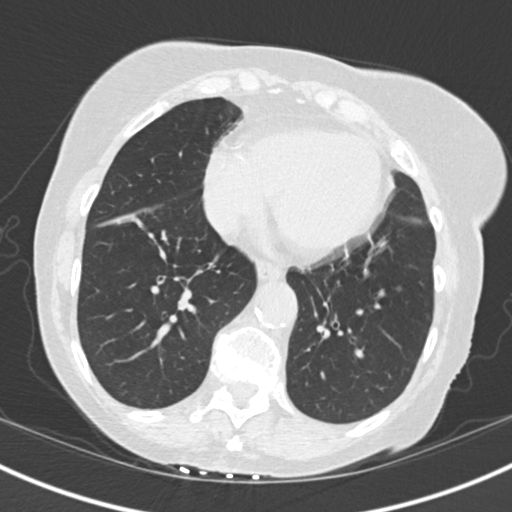
[im 60/156  mediastinal]
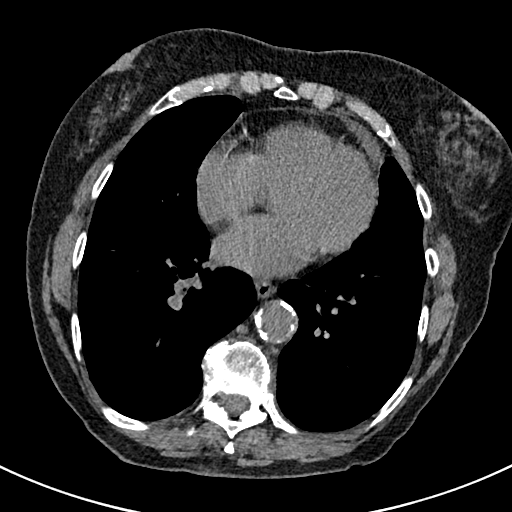
[im 60/156  lung]
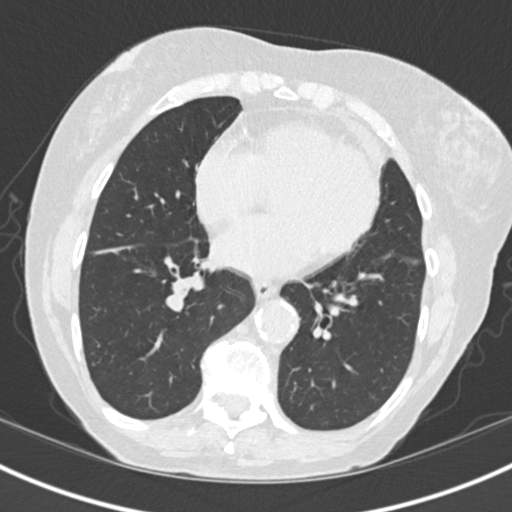
[im 72/156  lung]
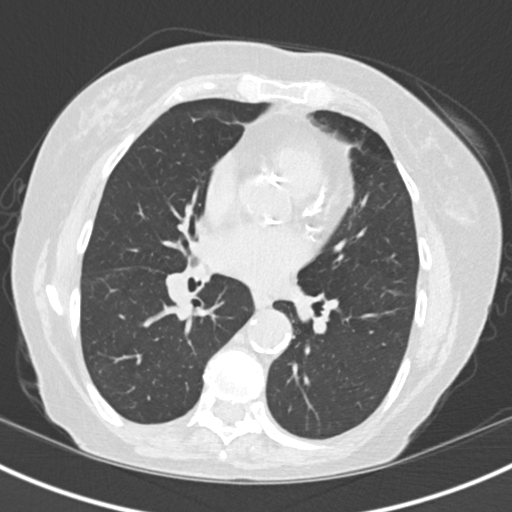
[im 84/156  lung]
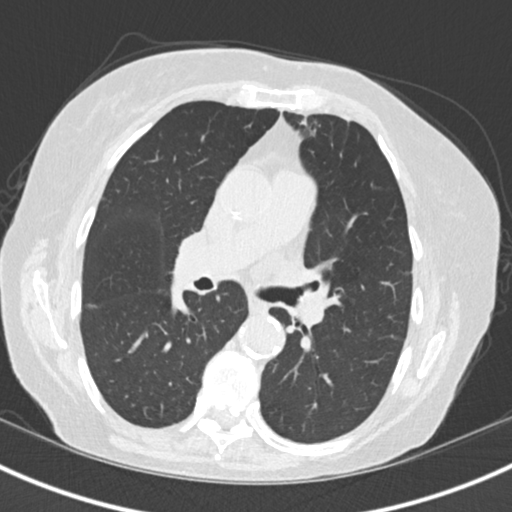
[im 96/156  lung]
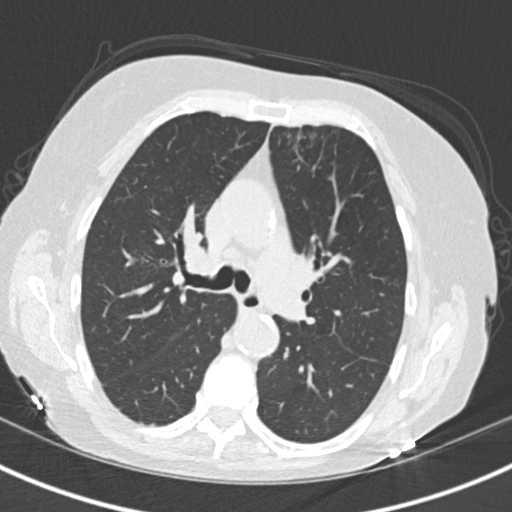
[im 108/156  mediastinal]
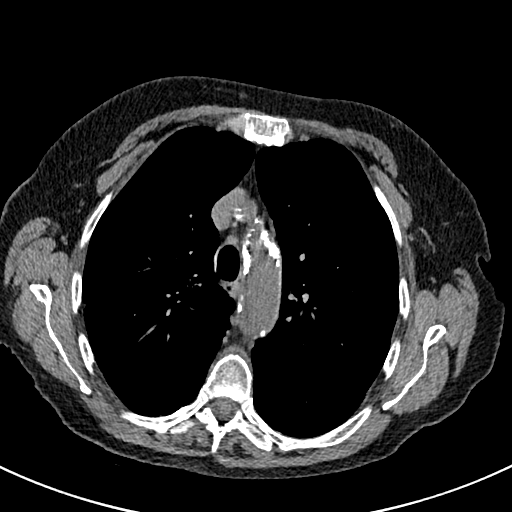
[im 108/156  lung]
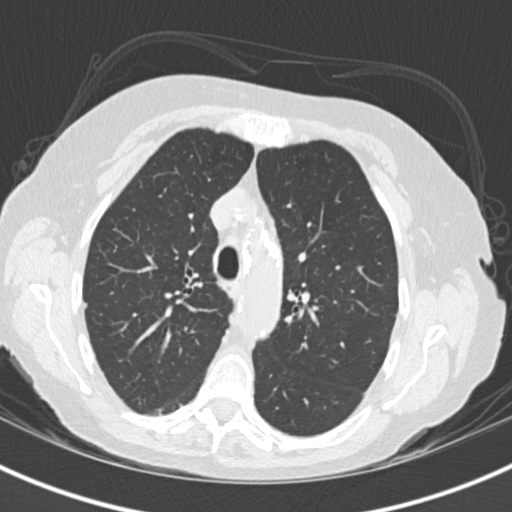
[im 120/156  lung]
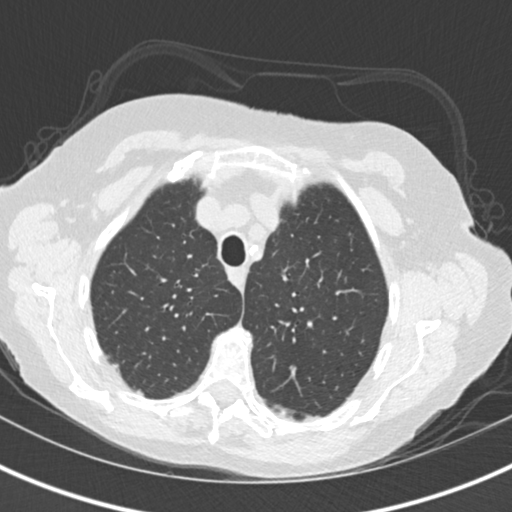
[im 132/156  lung]
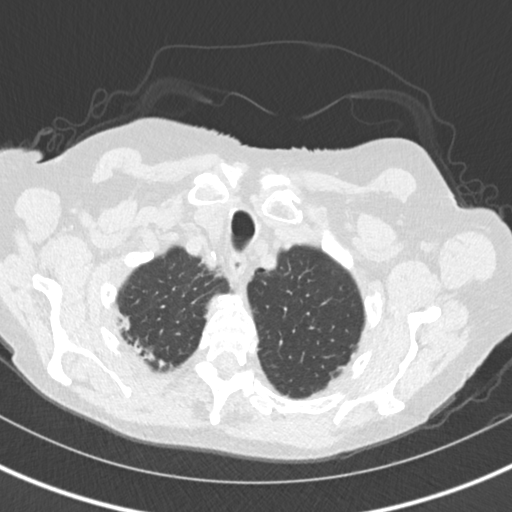
[im 144/156  lung]
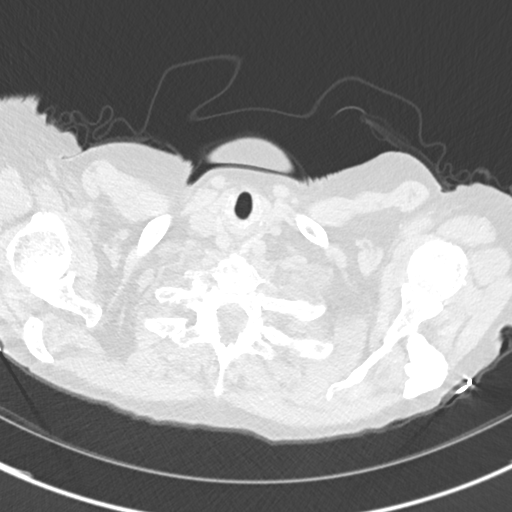

[Series 5: coronal · coronal · 0.55mm/px · 3 of 127 slices shown]
[im 26/127  lung]
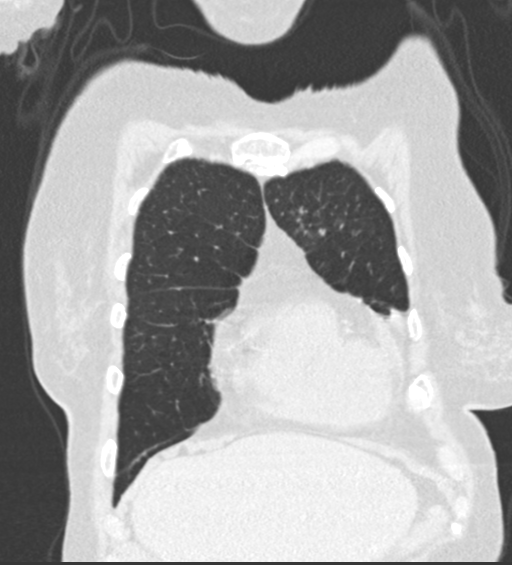
[im 51/127  lung]
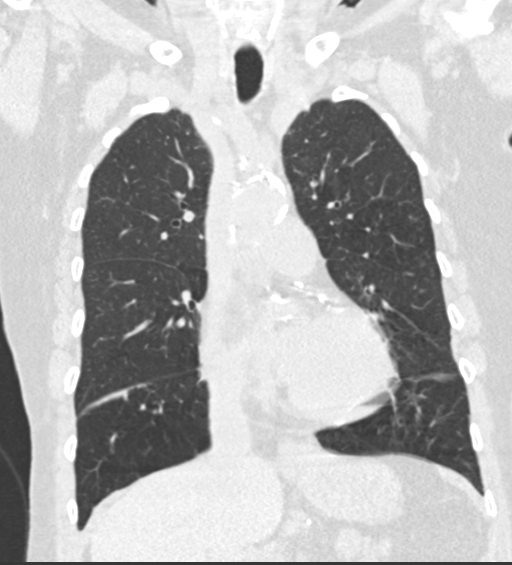
[im 76/127  lung]
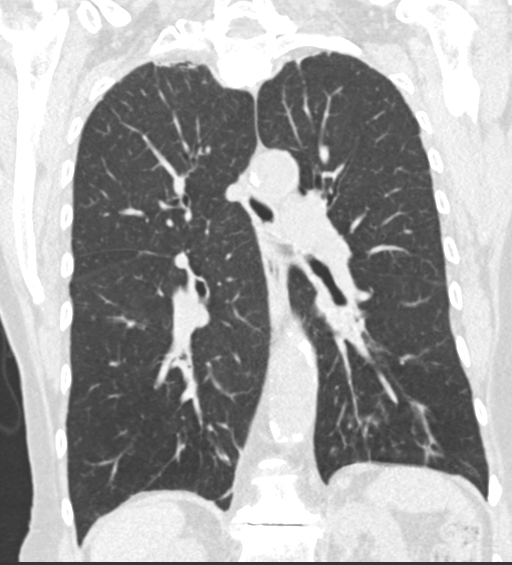

[15 of 36 positions shown; findings below may reference images not displayed]

FINDINGS: Cardiovascular: Atherosclerotic calcifications aorta, coronary
arteries, and proximal great vessels. Aorta normal caliber. Heart
size normal. Minimal pericardial effusion.

Mediastinum/Nodes: Small hiatal hernia. Esophagus normal appearance.
Scattered normal size mediastinal lymph nodes without thoracic
adenopathy. Base of cervical region normal appearance.

Lungs/Pleura: Hyperinflation and diffuse centrilobular emphysema.
Biapical scarring greater on RIGHT. Mild atelectasis along RIGHT
major fissure, new. 6 x 3mm LEFT lower lobe nodule image 125
previously 5 x 3 mm. Tiny RIGHT lung nodules peripherally unchanged.
Minimal chronic atelectasis or scarring anterior LEFT upper lobe
medially. No acute infiltrate, pleural effusion, or pneumothorax. No
new mass/nodule. Scattered mucous plugging in the lower lobes.
Posterior RIGHT diaphragmatic hernia containing fat noted.

Upper Abdomen: Unremarkable

Musculoskeletal: Scattered degenerative disc disease changes of
thoracic spine. Mild anterolisthesis at C7-T1 and T1-T2. Prior
cervical fusion. Posttraumatic deformity or a LEFT acromion.
IMPRESSION: 6 x 3 mm LEFT lower lobe nodule, minimally increased in size since
prior exam.

Additional tiny RIGHT lung nodules unchanged.

Small hiatal hernia.

Posterior RIGHT diaphragmatic hernia containing fat.

Scattered atherosclerotic calcifications including coronary
arteries.

Aortic Atherosclerosis ([id number]).

## 2022-10-11 ENCOUNTER — Other Ambulatory Visit: Payer: Self-pay | Admitting: Nephrology

## 2022-10-11 DIAGNOSIS — N184 Chronic kidney disease, stage 4 (severe): Secondary | ICD-10-CM

## 2022-10-12 ENCOUNTER — Ambulatory Visit
Admission: RE | Admit: 2022-10-12 | Discharge: 2022-10-12 | Disposition: A | Discharge status: Home / Self Care | Payer: PPO | Source: Ambulatory Visit | Attending: Nephrology | Admitting: Nephrology

## 2022-10-12 DIAGNOSIS — N189 Chronic kidney disease, unspecified: Secondary | ICD-10-CM | POA: Diagnosis not present

## 2022-10-12 DIAGNOSIS — N184 Chronic kidney disease, stage 4 (severe): Secondary | ICD-10-CM

## 2022-11-05 DIAGNOSIS — N184 Chronic kidney disease, stage 4 (severe): Secondary | ICD-10-CM | POA: Diagnosis not present

## 2022-11-05 DIAGNOSIS — Z79890 Hormone replacement therapy (postmenopausal): Secondary | ICD-10-CM | POA: Diagnosis not present

## 2022-11-05 DIAGNOSIS — E039 Hypothyroidism, unspecified: Secondary | ICD-10-CM | POA: Diagnosis not present

## 2022-11-11 DIAGNOSIS — M67432 Ganglion, left wrist: Secondary | ICD-10-CM | POA: Diagnosis not present

## 2022-11-11 DIAGNOSIS — M13832 Other specified arthritis, left wrist: Secondary | ICD-10-CM | POA: Diagnosis not present

## 2022-11-11 DIAGNOSIS — M13842 Other specified arthritis, left hand: Secondary | ICD-10-CM | POA: Diagnosis not present

## 2022-12-16 ENCOUNTER — Other Ambulatory Visit: Payer: Self-pay | Admitting: Cardiology

## 2022-12-16 DIAGNOSIS — E7800 Pure hypercholesterolemia, unspecified: Secondary | ICD-10-CM

## 2022-12-16 DIAGNOSIS — I10 Essential (primary) hypertension: Secondary | ICD-10-CM

## 2023-01-12 ENCOUNTER — Encounter: Payer: Self-pay | Admitting: Cardiology

## 2023-01-27 DIAGNOSIS — E039 Hypothyroidism, unspecified: Secondary | ICD-10-CM | POA: Diagnosis not present

## 2023-01-27 DIAGNOSIS — K219 Gastro-esophageal reflux disease without esophagitis: Secondary | ICD-10-CM | POA: Diagnosis not present

## 2023-01-27 DIAGNOSIS — K52831 Collagenous colitis: Secondary | ICD-10-CM | POA: Diagnosis not present

## 2023-01-27 DIAGNOSIS — K449 Diaphragmatic hernia without obstruction or gangrene: Secondary | ICD-10-CM | POA: Diagnosis not present

## 2023-01-27 DIAGNOSIS — E782 Mixed hyperlipidemia: Secondary | ICD-10-CM | POA: Diagnosis not present

## 2023-01-27 DIAGNOSIS — I10 Essential (primary) hypertension: Secondary | ICD-10-CM | POA: Diagnosis not present

## 2023-02-11 DIAGNOSIS — Z1231 Encounter for screening mammogram for malignant neoplasm of breast: Secondary | ICD-10-CM | POA: Diagnosis not present

## 2023-03-03 DIAGNOSIS — M150 Primary generalized (osteo)arthritis: Secondary | ICD-10-CM | POA: Diagnosis not present

## 2023-03-03 DIAGNOSIS — N1832 Chronic kidney disease, stage 3b: Secondary | ICD-10-CM | POA: Diagnosis not present

## 2023-03-03 DIAGNOSIS — D508 Other iron deficiency anemias: Secondary | ICD-10-CM | POA: Diagnosis not present

## 2023-03-03 DIAGNOSIS — E782 Mixed hyperlipidemia: Secondary | ICD-10-CM | POA: Diagnosis not present

## 2023-03-03 DIAGNOSIS — E039 Hypothyroidism, unspecified: Secondary | ICD-10-CM | POA: Diagnosis not present

## 2023-03-03 DIAGNOSIS — E538 Deficiency of other specified B group vitamins: Secondary | ICD-10-CM | POA: Diagnosis not present

## 2023-03-03 DIAGNOSIS — I129 Hypertensive chronic kidney disease with stage 1 through stage 4 chronic kidney disease, or unspecified chronic kidney disease: Secondary | ICD-10-CM | POA: Diagnosis not present

## 2023-03-03 DIAGNOSIS — I700 Atherosclerosis of aorta: Secondary | ICD-10-CM | POA: Diagnosis not present

## 2023-03-03 DIAGNOSIS — Z0000 Encounter for general adult medical examination without abnormal findings: Secondary | ICD-10-CM | POA: Diagnosis not present

## 2023-03-03 DIAGNOSIS — I6523 Occlusion and stenosis of bilateral carotid arteries: Secondary | ICD-10-CM | POA: Diagnosis not present

## 2023-03-03 DIAGNOSIS — I10 Essential (primary) hypertension: Secondary | ICD-10-CM | POA: Diagnosis not present

## 2023-03-03 DIAGNOSIS — Z23 Encounter for immunization: Secondary | ICD-10-CM | POA: Diagnosis not present

## 2023-03-04 DIAGNOSIS — L570 Actinic keratosis: Secondary | ICD-10-CM | POA: Diagnosis not present

## 2023-03-04 DIAGNOSIS — L309 Dermatitis, unspecified: Secondary | ICD-10-CM | POA: Diagnosis not present

## 2023-03-04 DIAGNOSIS — L814 Other melanin hyperpigmentation: Secondary | ICD-10-CM | POA: Diagnosis not present

## 2023-03-04 DIAGNOSIS — L820 Inflamed seborrheic keratosis: Secondary | ICD-10-CM | POA: Diagnosis not present

## 2023-03-04 DIAGNOSIS — L538 Other specified erythematous conditions: Secondary | ICD-10-CM | POA: Diagnosis not present

## 2023-03-04 DIAGNOSIS — C44612 Basal cell carcinoma of skin of right upper limb, including shoulder: Secondary | ICD-10-CM | POA: Diagnosis not present

## 2023-03-04 DIAGNOSIS — D225 Melanocytic nevi of trunk: Secondary | ICD-10-CM | POA: Diagnosis not present

## 2023-03-04 DIAGNOSIS — L821 Other seborrheic keratosis: Secondary | ICD-10-CM | POA: Diagnosis not present

## 2023-03-10 DIAGNOSIS — I700 Atherosclerosis of aorta: Secondary | ICD-10-CM | POA: Diagnosis not present

## 2023-03-10 DIAGNOSIS — M810 Age-related osteoporosis without current pathological fracture: Secondary | ICD-10-CM | POA: Diagnosis not present

## 2023-03-10 DIAGNOSIS — E039 Hypothyroidism, unspecified: Secondary | ICD-10-CM | POA: Diagnosis not present

## 2023-03-10 DIAGNOSIS — N1832 Chronic kidney disease, stage 3b: Secondary | ICD-10-CM | POA: Diagnosis not present

## 2023-03-10 DIAGNOSIS — I6523 Occlusion and stenosis of bilateral carotid arteries: Secondary | ICD-10-CM | POA: Diagnosis not present

## 2023-03-10 DIAGNOSIS — K52831 Collagenous colitis: Secondary | ICD-10-CM | POA: Diagnosis not present

## 2023-03-10 DIAGNOSIS — D692 Other nonthrombocytopenic purpura: Secondary | ICD-10-CM | POA: Diagnosis not present

## 2023-03-10 DIAGNOSIS — Z0000 Encounter for general adult medical examination without abnormal findings: Secondary | ICD-10-CM | POA: Diagnosis not present

## 2023-03-10 DIAGNOSIS — E782 Mixed hyperlipidemia: Secondary | ICD-10-CM | POA: Diagnosis not present

## 2023-03-10 DIAGNOSIS — M150 Primary generalized (osteo)arthritis: Secondary | ICD-10-CM | POA: Diagnosis not present

## 2023-03-10 DIAGNOSIS — D500 Iron deficiency anemia secondary to blood loss (chronic): Secondary | ICD-10-CM | POA: Diagnosis not present

## 2023-03-10 DIAGNOSIS — J432 Centrilobular emphysema: Secondary | ICD-10-CM | POA: Diagnosis not present

## 2023-03-24 DIAGNOSIS — D500 Iron deficiency anemia secondary to blood loss (chronic): Secondary | ICD-10-CM | POA: Diagnosis not present

## 2023-04-05 DIAGNOSIS — D500 Iron deficiency anemia secondary to blood loss (chronic): Secondary | ICD-10-CM | POA: Diagnosis not present

## 2023-04-11 ENCOUNTER — Other Ambulatory Visit: Payer: Self-pay | Admitting: Nephrology

## 2023-04-11 DIAGNOSIS — I129 Hypertensive chronic kidney disease with stage 1 through stage 4 chronic kidney disease, or unspecified chronic kidney disease: Secondary | ICD-10-CM | POA: Diagnosis not present

## 2023-04-11 DIAGNOSIS — N184 Chronic kidney disease, stage 4 (severe): Secondary | ICD-10-CM | POA: Diagnosis not present

## 2023-04-11 DIAGNOSIS — R609 Edema, unspecified: Secondary | ICD-10-CM | POA: Diagnosis not present

## 2023-04-11 DIAGNOSIS — E785 Hyperlipidemia, unspecified: Secondary | ICD-10-CM | POA: Diagnosis not present

## 2023-04-11 DIAGNOSIS — D631 Anemia in chronic kidney disease: Secondary | ICD-10-CM | POA: Diagnosis not present

## 2023-04-11 DIAGNOSIS — N390 Urinary tract infection, site not specified: Secondary | ICD-10-CM | POA: Diagnosis not present

## 2023-04-11 DIAGNOSIS — N2581 Secondary hyperparathyroidism of renal origin: Secondary | ICD-10-CM | POA: Diagnosis not present

## 2023-04-11 DIAGNOSIS — N189 Chronic kidney disease, unspecified: Secondary | ICD-10-CM | POA: Diagnosis not present

## 2023-04-11 DIAGNOSIS — I350 Nonrheumatic aortic (valve) stenosis: Secondary | ICD-10-CM | POA: Diagnosis not present

## 2023-04-11 LAB — LAB REPORT - SCANNED: EGFR: 38

## 2023-04-12 ENCOUNTER — Ambulatory Visit
Admission: RE | Admit: 2023-04-12 | Discharge: 2023-04-12 | Disposition: A | Discharge status: Home / Self Care | Payer: PPO | Source: Ambulatory Visit | Attending: Nephrology | Admitting: Nephrology

## 2023-04-12 DIAGNOSIS — N281 Cyst of kidney, acquired: Secondary | ICD-10-CM | POA: Diagnosis not present

## 2023-04-12 DIAGNOSIS — N184 Chronic kidney disease, stage 4 (severe): Secondary | ICD-10-CM

## 2023-04-13 DIAGNOSIS — D500 Iron deficiency anemia secondary to blood loss (chronic): Secondary | ICD-10-CM | POA: Diagnosis not present

## 2023-04-13 DIAGNOSIS — N1832 Chronic kidney disease, stage 3b: Secondary | ICD-10-CM | POA: Diagnosis not present

## 2023-04-15 DIAGNOSIS — H524 Presbyopia: Secondary | ICD-10-CM | POA: Diagnosis not present

## 2023-04-15 DIAGNOSIS — H26491 Other secondary cataract, right eye: Secondary | ICD-10-CM | POA: Diagnosis not present

## 2023-04-15 DIAGNOSIS — H43822 Vitreomacular adhesion, left eye: Secondary | ICD-10-CM | POA: Diagnosis not present

## 2023-04-15 DIAGNOSIS — H40013 Open angle with borderline findings, low risk, bilateral: Secondary | ICD-10-CM | POA: Diagnosis not present

## 2023-04-15 DIAGNOSIS — H04123 Dry eye syndrome of bilateral lacrimal glands: Secondary | ICD-10-CM | POA: Diagnosis not present

## 2023-04-19 DIAGNOSIS — C44612 Basal cell carcinoma of skin of right upper limb, including shoulder: Secondary | ICD-10-CM | POA: Diagnosis not present

## 2023-04-20 DIAGNOSIS — D500 Iron deficiency anemia secondary to blood loss (chronic): Secondary | ICD-10-CM | POA: Diagnosis not present

## 2023-04-20 DIAGNOSIS — E782 Mixed hyperlipidemia: Secondary | ICD-10-CM | POA: Diagnosis not present

## 2023-04-20 DIAGNOSIS — D692 Other nonthrombocytopenic purpura: Secondary | ICD-10-CM | POA: Diagnosis not present

## 2023-04-20 DIAGNOSIS — K52831 Collagenous colitis: Secondary | ICD-10-CM | POA: Diagnosis not present

## 2023-04-20 DIAGNOSIS — I350 Nonrheumatic aortic (valve) stenosis: Secondary | ICD-10-CM | POA: Diagnosis not present

## 2023-04-20 DIAGNOSIS — E039 Hypothyroidism, unspecified: Secondary | ICD-10-CM | POA: Diagnosis not present

## 2023-04-20 DIAGNOSIS — J432 Centrilobular emphysema: Secondary | ICD-10-CM | POA: Diagnosis not present

## 2023-04-20 DIAGNOSIS — I10 Essential (primary) hypertension: Secondary | ICD-10-CM | POA: Diagnosis not present

## 2023-04-20 DIAGNOSIS — R600 Localized edema: Secondary | ICD-10-CM | POA: Diagnosis not present

## 2023-04-20 DIAGNOSIS — I700 Atherosclerosis of aorta: Secondary | ICD-10-CM | POA: Diagnosis not present

## 2023-04-20 DIAGNOSIS — N1832 Chronic kidney disease, stage 3b: Secondary | ICD-10-CM | POA: Diagnosis not present

## 2023-04-20 DIAGNOSIS — I6523 Occlusion and stenosis of bilateral carotid arteries: Secondary | ICD-10-CM | POA: Diagnosis not present

## 2023-06-21 ENCOUNTER — Ambulatory Visit (HOSPITAL_COMMUNITY): Payer: PPO | Attending: Cardiology

## 2023-06-21 ENCOUNTER — Other Ambulatory Visit: Payer: PPO

## 2023-06-21 DIAGNOSIS — I351 Nonrheumatic aortic (valve) insufficiency: Secondary | ICD-10-CM | POA: Insufficient documentation

## 2023-06-21 LAB — ECHOCARDIOGRAM COMPLETE
AR max vel: 1.06 cm2
AV Area VTI: 1.11 cm2
AV Area mean vel: 1.11 cm2
AV Mean grad: 15.6 mm[Hg]
AV Peak grad: 29.2 mm[Hg]
Ao pk vel: 2.7 m/s
Area-P 1/2: 3.33 cm2
P 1/2 time: 416 ms
S' Lateral: 2.9 cm

## 2023-06-29 DIAGNOSIS — M810 Age-related osteoporosis without current pathological fracture: Secondary | ICD-10-CM | POA: Diagnosis not present

## 2023-06-30 ENCOUNTER — Ambulatory Visit: Payer: Self-pay | Admitting: Cardiology

## 2023-06-30 ENCOUNTER — Ambulatory Visit: Payer: PPO | Attending: Cardiology | Admitting: Cardiology

## 2023-06-30 ENCOUNTER — Encounter: Payer: Self-pay | Admitting: Cardiology

## 2023-06-30 VITALS — BP 132/74 | HR 41 | Resp 16 | Ht 63.0 in | Wt 128.4 lb

## 2023-06-30 DIAGNOSIS — I4891 Unspecified atrial fibrillation: Secondary | ICD-10-CM | POA: Diagnosis not present

## 2023-06-30 DIAGNOSIS — I10 Essential (primary) hypertension: Secondary | ICD-10-CM | POA: Diagnosis not present

## 2023-06-30 DIAGNOSIS — E7800 Pure hypercholesterolemia, unspecified: Secondary | ICD-10-CM | POA: Diagnosis not present

## 2023-06-30 DIAGNOSIS — I351 Nonrheumatic aortic (valve) insufficiency: Secondary | ICD-10-CM

## 2023-06-30 DIAGNOSIS — I6523 Occlusion and stenosis of bilateral carotid arteries: Secondary | ICD-10-CM

## 2023-06-30 MED ORDER — VERAPAMIL HCL ER 180 MG PO TBCR: 180.0000 mg | EXTENDED_RELEASE_TABLET | Freq: Every day | ORAL | 6 refills | Status: DC

## 2023-06-30 MED ORDER — APIXABAN 2.5 MG PO TABS: 2.5000 mg | ORAL_TABLET | Freq: Two times a day (BID) | ORAL | 6 refills | Status: DC

## 2023-06-30 MED ORDER — APIXABAN 2.5 MG PO TABS: 2.5000 mg | ORAL_TABLET | Freq: Two times a day (BID) | ORAL | 0 refills | Status: DC

## 2023-06-30 NOTE — Patient Instructions (Addendum)
 Medication Instructions:  Your physician has recommended you make the following change in your medication:  Stop Aspirin Stop amlodipine Start Verapamil 180 mg by mouth daily  Start Eliquis 2.5 mg by mouth twice daily  *If you need a refill on your cardiac medications before your next appointment, please call your pharmacy*   Lab Work: none If you have labs (blood work) drawn today and your tests are completely normal, you will receive your results only by: MyChart Message (if you have MyChart) OR A paper copy in the mail If you have any lab test that is abnormal or we need to change your treatment, we will call you to review the results.   Testing/Procedures: none   Follow-Up: At [facility], you and your health needs are our priority.  As part of our continuing mission to provide you with exceptional heart care, we have created designated Provider Care Teams.  These Care Teams include your primary Cardiologist (physician) and Advanced Practice Providers (APPs -  Physician Assistants and Nurse Practitioners) who all work together to provide you with the care you need, when you need it.  We recommend signing up for the patient portal called "MyChart".  Sign up information is provided on this After Visit Summary.  MyChart is used to connect with patients for Virtual Visits (Telemedicine).  Patients are able to view lab/test results, encounter notes, upcoming appointments, etc.  Non-urgent messages can be sent to your provider as well.   To learn more about what you can do with MyChart, go to [email or web address].    Your next appointment:   3 week(s)  Provider:   You will follow up in the [facility] Clinic located at [facility]. Your provider will be: Clint R. Fenton, PA-C or Lake Bells, PA-C    Then 3 months with Dr Jacinto Halim  [facility] CLINIC INFORMATION:  The [facility] Clinic is located on the [facility] at [address].  Patients should  enter through Entrance "A" of [facility] and go to [facility] (immediately to the left of the entrance) to the 6th floor. Once you exit the elevator on the 6th floor check in with the [facility] Clinic Registration Desk to the right of elevator. Valet parking is available at the entrance. Phone number: [phone]

## 2023-06-30 NOTE — Progress Notes (Signed)
 Cardiology Office Note:  .   Date:  06/30/2023  ID:  Joann Barnes, DOB 1941/02/02, MRN [id number] PCP: Georgianne Fick, MD  [facility] Providers Cardiologist:  Yates Decamp, MD   History of Present Illness: .   Joann Barnes is a 83 y.o. fairly active and independent Caucasian female patient with hypertension, hyperlipidemia, stage IIIb CKD,  GI bleed leading to blood transfusion in November 2022, COPD with centrilobular emphysema with ongoing tobacco use, hypothyroidism, migraines, GERD, coronary calcification noted on the CT scan in December 2019,  presents here for follow-up of dyspnea and moderate aortic regurgitation.  Discussed the use of AI scribe software for clinical note transcription with the patient, who gave verbal consent to proceed.  History of Present Illness   The patient, with a history of smoking and recent diagnosis of atrial fibrillation, presents with breathing difficulties and mucus production. She has been managing these symptoms with an over-the-counter supplement (VIT E SSE or similar) and prescribed inhalers, including Trelegy. The patient reports that these interventions have been helpful in managing her symptoms.  The patient's smoking habits vary, with an increase in smoking on colder days when she is unable to go outside. She estimates her smoking at half to three-quarters of a pack per day. Despite this, the patient maintains her independence and mobility, with no reported issues in getting around.  The patient also reports a history of stomach bleeding, which was treated several years ago. The cause of the bleeding was not definitively determined, but it was managed with a blood transfusion and subsequent treatment of angiodysplastic lesions in the duodenum and jejunum. The patient occasionally takes an 81mg  aspirin and uses Tylenol for aches and pains.     Labs   External Labs:  [facility] labs 04/11/2023:  Total cholesterol 156, triglycerides 86, HDL 54,  LDL 75.  Hb 11.1/platelets 311 K.  Serum creatinine 1.370, EGFR 38 mL.  TSH normal at 4.340.  Review of Systems  Cardiovascular:  Positive for dyspnea on exertion (stable). Negative for chest pain and leg swelling.  Respiratory:  Positive for cough.    Physical Exam:   VS:  BP 132/74 (BP Location: Left Arm, Patient Position: Sitting, Cuff Size: Normal)   Pulse (!) 41   Resp 16   Ht 5\' 3"  (1.6 m)   Wt 128 lb 6.4 oz (58.2 kg)   SpO2 92%   BMI 22.75 kg/m    Wt Readings from Last 3 Encounters:  06/30/23 128 lb 6.4 oz (58.2 kg)  06/28/22 132 lb 12.8 oz (60.2 kg)  09/07/21 137 lb 12.8 oz (62.5 kg)    Physical Exam Neck:     Vascular: No carotid bruit or JVD.  Cardiovascular:     Rate and Rhythm: Tachycardia present. Rhythm irregular.     Pulses: Normal pulses and intact distal pulses.     Heart sounds: No murmur heard. Pulmonary:     Effort: Pulmonary effort is normal.     Breath sounds: Normal breath sounds.  Abdominal:     General: Bowel sounds are normal.     Palpations: Abdomen is soft.  Musculoskeletal:     Right lower leg: No edema.     Left lower leg: No edema.  Skin:    Capillary Refill: Capillary refill takes less than 2 seconds.    Studies Reviewed: .    Upper GI endoscopy 03/06/2021: 4 cm hiatal hernia, gastritis, multiple bleeding angiodysplastic lesions in the duodenum treated with monopolar probe. Multiple  nonbleeding angio dysplastic lesions in the jejunum treated with monopolar probe.  Carotid artery duplex 12/24/2021: Duplex suggests stenosis in the right internal carotid artery (1-15%). Duplex suggests stenosis in the right external carotid artery (<50%). Duplex suggests stenosis in the left internal carotid artery (1-15%). Mild heterogenous plaque noted in bilateral carotid arteries. Antegrade right vertebral artery flow. Antegrade left vertebral artery flow. No significant change from prior study 08/11/2021.  ECHOCARDIOGRAM COMPLETE 06/21/2023   1. Left ventricular ejection fraction, by estimation, is 60 to 65%. The left ventricle has normal function. The left ventricle has no regional wall motion abnormalities. Left ventricular diastolic function could not be evaluated. Elevated left atrial pressure. 2. Right ventricular systolic function is normal. The right ventricular size is normal. 3. Left atrial size was moderately dilated. 4. The mitral valve is normal in structure. Moderate mitral valve regurgitation. No evidence of mitral stenosis. 5. Tricuspid valve regurgitation is moderate. 6. The aortic valve is calcified. Aortic valve regurgitation is moderate. Mild to moderate aortic valve stenosis. 7. The inferior vena cava is normal in size with greater than 50% respiratory variability, suggesting right atrial pressure of 3 mmHg.   EKG:    EKG Interpretation Date/Time:  Thursday June 30 2023 10:48:22 EST Ventricular Rate:  118 PR Interval:    QRS Duration:  118 QT Interval:  308 QTC Calculation: 431 R Axis:   -58  Text Interpretation: EKG 06/30/2023: Atrial fibrillation with rapid ventricular response at the rate of 118 bpm, left anterior fascicular block.  Incomplete right bundle branch block.  Poor R wave progression, cannot exclude anteroseptal infarct old.  LVH.  Nonspecific T abnormality.  Compared to 02/05/2021, atrial fibrillation is new. Confirmed by Delrae Rend [id number]) on 06/30/2023 11:02:32 AM     Medications and allergies    Allergies  Allergen Reactions   Bactrim [Sulfamethoxazole-Trimethoprim] Shortness Of Breath   Nicotine Anaphylaxis, Itching, Rash and Other (See Comments)    Rash and itch with patch locally; Chewing the gum closed her throat   Atorvastatin Other (See Comments)    Muscle aches and leg edema   Codeine Itching and Nausea And Vomiting   Benadryl [Diphenhydramine Hcl] Other (See Comments)    Drowsiness    Chantix [Varenicline Tartrate] Hives, Itching, Nausea And Vomiting, Rash and  Other (See Comments)    Mental status changes   Wellbutrin [Bupropion] Rash and Other (See Comments)    Slight rash around ankles   Zolpidem Tartrate Other (See Comments)    Bad dreams     Current Outpatient Medications:    acetaminophen (TYLENOL) 500 MG tablet, Take 1,000 mg by mouth every 6 (six) hours as needed for mild pain or headache., Disp: , Rfl:    apixaban (ELIQUIS) 2.5 MG TABS tablet, Take 1 tablet (2.5 mg total) by mouth 2 (two) times daily., Disp: 60 tablet, Rfl: 6   apixaban (ELIQUIS) 2.5 MG TABS tablet, Take 1 tablet (2.5 mg total) by mouth 2 (two) times daily., Disp: 56 tablet, Rfl: 0   AZO-CRANBERRY PO, Take 2 tablets by mouth daily as needed (for urinary symptoms)., Disp: , Rfl:    calcium carbonate (TUMS - DOSED IN MG ELEMENTAL CALCIUM) 500 MG chewable tablet, Chew 1 tablet by mouth daily as needed for heartburn or indigestion., Disp: , Rfl:    Cholecalciferol (VITAMIN D3) 2000 units TABS, Take 2,000 Units by mouth daily., Disp: , Rfl:    diazepam (VALIUM) 5 MG tablet, Take 5 mg by mouth 2 (two) times daily as needed  for muscle spasms., Disp: , Rfl:    escitalopram (LEXAPRO) 20 MG tablet, Take 20 mg by mouth at bedtime., Disp: , Rfl:    famotidine (PEPCID) 40 MG tablet, Take 40 mg by mouth 2 (two) times daily., Disp: , Rfl:    Fluticasone-Umeclidin-Vilant 100-62.5-25 MCG/INH AEPB, Inhale 1 puff into the lungs daily., Disp: , Rfl:    furosemide (LASIX) 20 MG tablet, Take 20-40 mg by mouth daily as needed for edema., Disp: , Rfl:    gabapentin (NEURONTIN) 300 MG capsule, Take 300 mg by mouth 3 (three) times daily., Disp: , Rfl:    levothyroxine (SYNTHROID) 25 MCG tablet, Take 25 mcg by mouth daily before breakfast., Disp: , Rfl:    pravastatin (PRAVACHOL) 80 MG tablet, Take 80 mg by mouth every evening., Disp: , Rfl:    PROAIR HFA 108 (90 Base) MCG/ACT inhaler, Inhale 2 puffs into the lungs every 6 (six) hours as needed for wheezing or shortness of breath., Disp: , Rfl:     verapamil (CALAN-SR) 180 MG CR tablet, Take 1 tablet (180 mg total) by mouth daily., Disp: 30 tablet, Rfl: 6   zoledronic acid (RECLAST) 5 MG/100ML SOLN injection, Inject 5 mg into the vein once., Disp: , Rfl:    albuterol (ACCUNEB) 1.25 MG/3ML nebulizer solution, Take 1 ampule by nebulization 2 (two) times daily as needed for wheezing or shortness of breath. (Patient not taking: Reported on 06/30/2023), Disp: , Rfl:    ASSESSMENT AND PLAN: .      ICD-10-CM   1. New onset atrial fibrillation (HCC)  I48.91     2. Moderate aortic regurgitation  I35.1     3. Atherosclerosis of both carotid arteries  I65.23 EKG 12-Lead    4. Primary hypertension  I10     5. Hypercholesteremia  E78.00      Click Here to Calculate/Change CHADS2VASc Score The patient's CHADS2-VASc score is 5, indicating a 7.2% annual risk of stroke.  Therefore, anticoagulation is recommended.   CHF History: No HTN History: Yes Diabetes History: No Stroke History: No Vascular Disease History: Yes [birth year]/02[birth year]   Assessment and Plan    Atrial Fibrillation New onset atrial fibrillation with rapid ventricular response at 118 bpm is present, though asymptomatic. There is an increased risk of ischemic stroke, accounting for 15-20% of such strokes. An echocardiogram shows regular rhythm and stable valve function, indicating recent onset. A fivefold increased stroke risk and the importance of anticoagulation therapy were discussed. The low risk of cardioversion, with a mortality risk of less than 1 in 10,000, was explained. Start Eliquis 2.5 mg twice daily and discontinue amlodipine 5 mg. Begin verapamil SR 180 mg daily. Schedule cardioversion in one month and arrange follow-up in 2-3 months.  Gastrointestinal Bleeding Gastrointestinal bleeding is due to angiodysplastic lesions in the duodenum and jejunum, treated with a monopolar probe. Aspirin 81 mg and Tylenol are occasionally taken for pain relief. Discontinue aspirin due to  increased bleeding risk with anticoagulation therapy. Monitor for signs of gastrointestinal bleeding.  Smoking Cessation Smoking half to three-fourths of a pack per day increases bleeding risk with anticoagulation and recurrence of angiodysplastic lesions. Previous attempts to quit using patches and Chantix were unsuccessful. Advise complete smoking cessation and discuss increased bleeding risk and recurrence of angiodysplastic lesions due to smoking.  General Health Maintenance Independent in driving and performing daily activities without difficulty. No alcohol consumption is reported. Continue current medications and supplements as tolerated. Monitor for new symptoms or changes in health status.  Follow-up Schedule follow-up in 2-3 months and arrange lab work to monitor blood count every 2-3 months initially.     I have reviewed her echocardiogram, aortic regurgitation remained stable.  She has normal LVEF.  Blood pressure is well-controlled, lipids are at goal.  No changes statin therapy was done today.  I will schedule her for A-fib clinic evaluation in 2 to 3 weeks and direct-current cardioversion after seen by A-fib clinic after she has been on anticoagulation for at least 3 weeks to 4 weeks.  I will see her back in the office in 3 months.  This was a complex office visit with discussions regarding anticoagulation, management of arrhythmias and life-threatening GI bleed that could occur and smoking cessation.      Signed,  Yates Decamp, MD, [facility] 06/30/2023, 11:24 AM [facility] [address] [place], [place] [address] Phone: [phone]. Fax:  [phone]

## 2023-07-04 ENCOUNTER — Encounter: Payer: Self-pay | Admitting: Internal Medicine

## 2023-07-12 DIAGNOSIS — Z85828 Personal history of other malignant neoplasm of skin: Secondary | ICD-10-CM | POA: Diagnosis not present

## 2023-07-12 DIAGNOSIS — L821 Other seborrheic keratosis: Secondary | ICD-10-CM | POA: Diagnosis not present

## 2023-07-12 DIAGNOSIS — Z08 Encounter for follow-up examination after completed treatment for malignant neoplasm: Secondary | ICD-10-CM | POA: Diagnosis not present

## 2023-07-12 DIAGNOSIS — L570 Actinic keratosis: Secondary | ICD-10-CM | POA: Diagnosis not present

## 2023-07-12 DIAGNOSIS — L814 Other melanin hyperpigmentation: Secondary | ICD-10-CM | POA: Diagnosis not present

## 2023-07-21 ENCOUNTER — Ambulatory Visit (HOSPITAL_COMMUNITY)
Admission: RE | Admit: 2023-07-21 | Discharge: 2023-07-21 | Disposition: A | Discharge status: Home / Self Care | Payer: PPO | Source: Ambulatory Visit | Attending: Internal Medicine | Admitting: Internal Medicine

## 2023-07-21 VITALS — BP 140/56 | HR 81 | Ht 63.0 in | Wt 128.6 lb

## 2023-07-21 DIAGNOSIS — I4819 Other persistent atrial fibrillation: Secondary | ICD-10-CM

## 2023-07-21 DIAGNOSIS — J449 Chronic obstructive pulmonary disease, unspecified: Secondary | ICD-10-CM | POA: Insufficient documentation

## 2023-07-21 DIAGNOSIS — E785 Hyperlipidemia, unspecified: Secondary | ICD-10-CM | POA: Diagnosis not present

## 2023-07-21 DIAGNOSIS — N1832 Chronic kidney disease, stage 3b: Secondary | ICD-10-CM | POA: Insufficient documentation

## 2023-07-21 DIAGNOSIS — I083 Combined rheumatic disorders of mitral, aortic and tricuspid valves: Secondary | ICD-10-CM | POA: Insufficient documentation

## 2023-07-21 DIAGNOSIS — I4891 Unspecified atrial fibrillation: Secondary | ICD-10-CM

## 2023-07-21 DIAGNOSIS — E039 Hypothyroidism, unspecified: Secondary | ICD-10-CM | POA: Diagnosis not present

## 2023-07-21 DIAGNOSIS — Z7901 Long term (current) use of anticoagulants: Secondary | ICD-10-CM | POA: Insufficient documentation

## 2023-07-21 DIAGNOSIS — I10 Essential (primary) hypertension: Secondary | ICD-10-CM | POA: Insufficient documentation

## 2023-07-21 DIAGNOSIS — D6869 Other thrombophilia: Secondary | ICD-10-CM | POA: Diagnosis not present

## 2023-07-21 LAB — CBC
HCT: 31 % — ABNORMAL LOW (ref 36.0–46.0)
Hemoglobin: 10.1 g/dL — ABNORMAL LOW (ref 12.0–15.0)
MCH: 31.1 pg (ref 26.0–34.0)
MCHC: 32.6 g/dL (ref 30.0–36.0)
MCV: 95.4 fL (ref 80.0–100.0)
Platelets: 317 10*3/uL (ref 150–400)
RBC: 3.25 MIL/uL — ABNORMAL LOW (ref 3.87–5.11)
RDW: 15.9 % — ABNORMAL HIGH (ref 11.5–15.5)
WBC: 7.7 10*3/uL (ref 4.0–10.5)
nRBC: 0 % (ref 0.0–0.2)

## 2023-07-21 LAB — BASIC METABOLIC PANEL
Anion gap: 9 (ref 5–15)
BUN: 15 mg/dL (ref 8–23)
CO2: 21 mmol/L — ABNORMAL LOW (ref 22–32)
Calcium: 8.7 mg/dL — ABNORMAL LOW (ref 8.9–10.3)
Chloride: 111 mmol/L (ref 98–111)
Creatinine, Ser: 2.24 mg/dL — ABNORMAL HIGH (ref 0.44–1.00)
GFR, Estimated: 21 mL/min — ABNORMAL LOW (ref >60)
Glucose, Bld: 94 mg/dL (ref 70–99)
Potassium: 3.4 mmol/L — ABNORMAL LOW (ref 3.5–5.1)
Sodium: 141 mmol/L (ref 135–145)

## 2023-07-21 NOTE — H&P (View-Only) (Signed)
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 female with a history of HTN, HLD, stage IIIb CKD, GI bleed leading to blood transufion 03/2021, COPD with ongoing tobacco use, hypothyroidism, GERD, coronary artery calcification by CT imaging, and atrial fibrillation who presents for consultation in the [facility]. Seen by Dr. Jacinto Halim on 06/30/23 and noted to be in new Afib with RVR. Started on verapamil SR 180 mg daily. Patient is on Eliquis 2.5 mg BID for a CHADS2VASC score of 5.  On evaluation today, she is currently in Afib. Overall, patient notes she feels alright even though in Afib. She has baseline SOB which is not new and not necessarily worse since Dr. Jacinto Halim noted she was in Afib. She does not seem to have cardiac awareness. No missed doses of Eliquis.   Today, she denies symptoms of palpitations, chest pain, shortness of breath, orthopnea, PND, lower extremity edema, dizziness, presyncope, syncope, snoring, daytime somnolence, bleeding, or neurologic sequela. The patient is tolerating medications without difficulties and is otherwise without complaint today.    she has a BMI of Body mass index is 22.78 kg/m.Marland Kitchen Filed Weights   07/21/23 0928  Weight: 58.3 kg    Current Outpatient Medications  Medication Sig Dispense Refill   acetaminophen (TYLENOL) 500 MG tablet Take 1,000 mg by mouth as needed for mild pain (pain score 1-3) or headache.     albuterol (ACCUNEB) 1.25 MG/3ML nebulizer solution Take 1 ampule by nebulization 2 (two) times daily as needed for wheezing or shortness of breath.     apixaban (ELIQUIS) 2.5 MG TABS tablet Take 1 tablet (2.5 mg total) by mouth 2 (two) times daily. 60 tablet 6   AZO-CRANBERRY PO Take 2 tablets by mouth daily as needed (for urinary symptoms).     calcium carbonate (TUMS - DOSED IN MG  ELEMENTAL CALCIUM) 500 MG chewable tablet Chew 1 tablet by mouth daily as needed for heartburn or indigestion.     Cholecalciferol (VITAMIN D3) 2000 units TABS Take 2,000 Units by mouth daily.     diazepam (VALIUM) 5 MG tablet Take 5 mg by mouth as needed for muscle spasms.     escitalopram (LEXAPRO) 20 MG tablet Take 20 mg by mouth at bedtime.     famotidine (PEPCID) 40 MG tablet Take 40 mg by mouth 2 (two) times daily.     Fluticasone-Umeclidin-Vilant 100-62.5-25 MCG/INH AEPB Inhale 1 puff into the lungs daily.     furosemide (LASIX) 20 MG tablet Take 20-40 mg by mouth daily as needed for edema.     gabapentin (NEURONTIN) 300 MG capsule Take 300 mg by mouth 3 (three) times daily.     levothyroxine (SYNTHROID) 25 MCG tablet Take 25 mcg by mouth daily before breakfast.     pravastatin (PRAVACHOL) 80 MG tablet Take 80 mg by mouth every evening.     PROAIR HFA 108 (90 Base) MCG/ACT inhaler Inhale 2 puffs into the lungs every 6 (six) hours as needed for wheezing or shortness of breath.     verapamil (CALAN-SR) 180 MG CR tablet Take 1 tablet (180 mg total) by mouth daily. 30 tablet 6   zoledronic acid (RECLAST) 5 MG/100ML SOLN injection Inject 5 mg into the vein once. Takes once a year     No current facility-administered medications for this encounter.    Atrial  Fibrillation Management history:  Previous antiarrhythmic drugs: none Previous cardioversions: none Previous ablations: none Anticoagulation history: Eliquis 2.5 mg BID   ROS- All systems are reviewed and negative except as per the HPI above.  Physical Exam: BP (!) 140/56   Pulse 81   Ht 5\' 3"  (1.6 m)   Wt 58.3 kg   BMI 22.78 kg/m   GEN: Well nourished, well developed in no acute distress NECK: No JVD; No carotid bruits CARDIAC: Irregularly irregular rate and rhythm, 2/6 systolic ejection murmur; no rubs or gallops RESPIRATORY:  Clear to auscultation without rales, wheezing or rhonchi  ABDOMEN: Soft, non-tender,  non-distended EXTREMITIES:  No edema; No deformity   EKG today demonstrates  Vent. rate 81 BPM PR interval * ms QRS duration 122 ms QT/QTcB 424/492 ms P-R-T axes * -59 73 Atrial fibrillation Left axis deviation Non-specific intra-ventricular conduction delay Minimal voltage criteria for LVH, may be normal variant ( Cornell product ) Abnormal ECG When compared with ECG of 30-Jun-2023 10:48, PREVIOUS ECG IS PRESENT  Echo 06/21/23 demonstrated  1. Left ventricular ejection fraction, by estimation, is 60 to 65%. The  left ventricle has normal function. The left ventricle has no regional  wall motion abnormalities. Left ventricular diastolic function could not  be evaluated. Elevated left atrial  pressure.   2. Right ventricular systolic function is normal. The right ventricular  size is normal.   3. Left atrial size was moderately dilated.   4. The mitral valve is normal in structure. Moderate mitral valve  regurgitation. No evidence of mitral stenosis.   5. Tricuspid valve regurgitation is moderate.   6. The aortic valve is calcified. Aortic valve regurgitation is moderate.  Mild to moderate aortic valve stenosis.   7. The inferior vena cava is normal in size with greater than 50%  respiratory variability, suggesting right atrial pressure of 3 mmHg.    ASSESSMENT & PLAN CHA2DS2-VASc Score = 5  The patient's score is based upon: CHF History: 0 HTN History: 1 Diabetes History: 0 Stroke History: 0 Vascular Disease History: 1 Age Score: 2 Gender Score: 1       ASSESSMENT AND PLAN: Persistent Atrial Fibrillation (ICD10:  I48.19) The patient's CHA2DS2-VASc score is 5, indicating a 7.2% annual risk of stroke.    She is in Afib. We discussed cardioversion as a procedure to attempt to convert to NSR. We discussed there could be ERAF despite a successful cardioversion. We discussed risks vs benefits of cardioversion. After discussion, she agrees to proceed with cardioversion.  Labs drawn today. Since patient notes she feels okay overall while in rate controlled Afib, will discuss post cardioversion with primary cardiologist if she has ERAF. Question as to whether rate control may be beneficial versus rhythm control.   Informed Consent   Shared Decision Making/Informed Consent The risks (stroke, cardiac arrhythmias rarely resulting in the need for a temporary or permanent pacemaker, skin irritation or burns and complications associated with conscious sedation including aspiration, arrhythmia, respiratory failure and death), benefits (restoration of normal sinus rhythm) and alternatives of a direct current cardioversion were explained in detail to Ms. Dress and she agrees to proceed.      Secondary Hypercoagulable State (ICD10:  D68.69) The patient is at significant risk for stroke/thromboembolism based upon her CHA2DS2-VASc Score of 5.  Continue Apixaban (Eliquis).  No missed doses.    Follow up 2 weeks after DCCV.   L[place] Bells, PA-C  Afib Clinic [facility] [address] [place], [place] [address]  [phone]  

## 2023-07-21 NOTE — Addendum Note (Signed)
 Encounter addended by: Eustace Pen, PA-C on: 07/21/2023 12:00 PM  Actions taken: Clinical Note Signed

## 2023-07-21 NOTE — Patient Instructions (Signed)
 Cardioversion scheduled for: Monday, March 24th   - Arrive at the [facility] and go to admitting at 9:00am   - Do not eat or drink anything after midnight the night prior to your procedure.   - Take all your morning medication (except diabetic medications) with a sip of water prior to arrival.  - You will not be able to drive home after your procedure.    - Do NOT miss any doses of your blood thinner - if you should miss a dose please notify our office immediately.   - If you feel as if you go back into normal rhythm prior to scheduled cardioversion, please notify our office immediately.   If your procedure is canceled in the cardioversion suite you will be charged a cancellation fee.

## 2023-07-21 NOTE — Progress Notes (Addendum)
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 female with a history of HTN, HLD, stage IIIb CKD, GI bleed leading to blood transufion 03/2021, COPD with ongoing tobacco use, hypothyroidism, GERD, coronary artery calcification by CT imaging, and atrial fibrillation who presents for consultation in the [facility]. Seen by Dr. Jacinto Halim on 06/30/23 and noted to be in new Afib with RVR. Started on verapamil SR 180 mg daily. Patient is on Eliquis 2.5 mg BID for a CHADS2VASC score of 5.  On evaluation today, she is currently in Afib. Overall, patient notes she feels alright even though in Afib. She has baseline SOB which is not new and not necessarily worse since Dr. Jacinto Halim noted she was in Afib. She does not seem to have cardiac awareness. No missed doses of Eliquis.   Today, she denies symptoms of palpitations, chest pain, shortness of breath, orthopnea, PND, lower extremity edema, dizziness, presyncope, syncope, snoring, daytime somnolence, bleeding, or neurologic sequela. The patient is tolerating medications without difficulties and is otherwise without complaint today.    she has a BMI of Body mass index is 22.78 kg/m.Marland Kitchen Filed Weights   07/21/23 0928  Weight: 58.3 kg    Current Outpatient Medications  Medication Sig Dispense Refill   acetaminophen (TYLENOL) 500 MG tablet Take 1,000 mg by mouth as needed for mild pain (pain score 1-3) or headache.     albuterol (ACCUNEB) 1.25 MG/3ML nebulizer solution Take 1 ampule by nebulization 2 (two) times daily as needed for wheezing or shortness of breath.     apixaban (ELIQUIS) 2.5 MG TABS tablet Take 1 tablet (2.5 mg total) by mouth 2 (two) times daily. 60 tablet 6   AZO-CRANBERRY PO Take 2 tablets by mouth daily as needed (for urinary symptoms).     calcium carbonate (TUMS - DOSED IN MG  ELEMENTAL CALCIUM) 500 MG chewable tablet Chew 1 tablet by mouth daily as needed for heartburn or indigestion.     Cholecalciferol (VITAMIN D3) 2000 units TABS Take 2,000 Units by mouth daily.     diazepam (VALIUM) 5 MG tablet Take 5 mg by mouth as needed for muscle spasms.     escitalopram (LEXAPRO) 20 MG tablet Take 20 mg by mouth at bedtime.     famotidine (PEPCID) 40 MG tablet Take 40 mg by mouth 2 (two) times daily.     Fluticasone-Umeclidin-Vilant 100-62.5-25 MCG/INH AEPB Inhale 1 puff into the lungs daily.     furosemide (LASIX) 20 MG tablet Take 20-40 mg by mouth daily as needed for edema.     gabapentin (NEURONTIN) 300 MG capsule Take 300 mg by mouth 3 (three) times daily.     levothyroxine (SYNTHROID) 25 MCG tablet Take 25 mcg by mouth daily before breakfast.     pravastatin (PRAVACHOL) 80 MG tablet Take 80 mg by mouth every evening.     PROAIR HFA 108 (90 Base) MCG/ACT inhaler Inhale 2 puffs into the lungs every 6 (six) hours as needed for wheezing or shortness of breath.     verapamil (CALAN-SR) 180 MG CR tablet Take 1 tablet (180 mg total) by mouth daily. 30 tablet 6   zoledronic acid (RECLAST) 5 MG/100ML SOLN injection Inject 5 mg into the vein once. Takes once a year     No current facility-administered medications for this encounter.    Atrial  Fibrillation Management history:  Previous antiarrhythmic drugs: none Previous cardioversions: none Previous ablations: none Anticoagulation history: Eliquis 2.5 mg BID   ROS- All systems are reviewed and negative except as per the HPI above.  Physical Exam: BP (!) 140/56   Pulse 81   Ht 5\' 3"  (1.6 m)   Wt 58.3 kg   BMI 22.78 kg/m   GEN: Well nourished, well developed in no acute distress NECK: No JVD; No carotid bruits CARDIAC: Irregularly irregular rate and rhythm, 2/6 systolic ejection murmur; no rubs or gallops RESPIRATORY:  Clear to auscultation without rales, wheezing or rhonchi  ABDOMEN: Soft, non-tender,  non-distended EXTREMITIES:  No edema; No deformity   EKG today demonstrates  Vent. rate 81 BPM PR interval * ms QRS duration 122 ms QT/QTcB 424/492 ms P-R-T axes * -59 73 Atrial fibrillation Left axis deviation Non-specific intra-ventricular conduction delay Minimal voltage criteria for LVH, may be normal variant ( Cornell product ) Abnormal ECG When compared with ECG of 30-Jun-2023 10:48, PREVIOUS ECG IS PRESENT  Echo 06/21/23 demonstrated  1. Left ventricular ejection fraction, by estimation, is 60 to 65%. The  left ventricle has normal function. The left ventricle has no regional  wall motion abnormalities. Left ventricular diastolic function could not  be evaluated. Elevated left atrial  pressure.   2. Right ventricular systolic function is normal. The right ventricular  size is normal.   3. Left atrial size was moderately dilated.   4. The mitral valve is normal in structure. Moderate mitral valve  regurgitation. No evidence of mitral stenosis.   5. Tricuspid valve regurgitation is moderate.   6. The aortic valve is calcified. Aortic valve regurgitation is moderate.  Mild to moderate aortic valve stenosis.   7. The inferior vena cava is normal in size with greater than 50%  respiratory variability, suggesting right atrial pressure of 3 mmHg.    ASSESSMENT & PLAN CHA2DS2-VASc Score = 5  The patient's score is based upon: CHF History: 0 HTN History: 1 Diabetes History: 0 Stroke History: 0 Vascular Disease History: 1 Age Score: 2 Gender Score: 1       ASSESSMENT AND PLAN: Persistent Atrial Fibrillation (ICD10:  I48.19) The patient's CHA2DS2-VASc score is 5, indicating a 7.2% annual risk of stroke.    She is in Afib. We discussed cardioversion as a procedure to attempt to convert to NSR. We discussed there could be ERAF despite a successful cardioversion. We discussed risks vs benefits of cardioversion. After discussion, she agrees to proceed with cardioversion.  Labs drawn today. Since patient notes she feels okay overall while in rate controlled Afib, will discuss post cardioversion with primary cardiologist if she has ERAF. Question as to whether rate control may be beneficial versus rhythm control.   Informed Consent   Shared Decision Making/Informed Consent The risks (stroke, cardiac arrhythmias rarely resulting in the need for a temporary or permanent pacemaker, skin irritation or burns and complications associated with conscious sedation including aspiration, arrhythmia, respiratory failure and death), benefits (restoration of normal sinus rhythm) and alternatives of a direct current cardioversion were explained in detail to Ms. Dress and she agrees to proceed.      Secondary Hypercoagulable State (ICD10:  D68.69) The patient is at significant risk for stroke/thromboembolism based upon her CHA2DS2-VASc Score of 5.  Continue Apixaban (Eliquis).  No missed doses.    Follow up 2 weeks after DCCV.   Lake Bells, PA-C  Afib Clinic [facility] [address] [place], [place] [address]  [phone]  

## 2023-07-22 MED ORDER — POTASSIUM CHLORIDE CRYS ER 20 MEQ PO TBCR: 20.0000 meq | EXTENDED_RELEASE_TABLET | Freq: Every day | ORAL | 0 refills | Status: DC

## 2023-07-22 NOTE — Progress Notes (Signed)
 Called patient with pre-procedure instructions for Monday March 24. Unable to reach patient. Each call received a busy signal. Not able to leave voicemail  Patient informed of:   Time to arrive for procedure. Remain NPO past midnight.  Must have a ride home and a responsible adult to remain with them for 24 hours post procedure.  Confirmed blood thinner. Confirmed no breaks in taking blood thinner for 3+ weeks prior to procedure. Confirmed patient stopped all GLP-1s and GLP-2s for at least one week before procedure.

## 2023-07-22 NOTE — Addendum Note (Signed)
 Encounter addended by: Graylin Shiver, CMA on: 07/22/2023 1:43 PM  Actions taken: Order list changed

## 2023-07-25 ENCOUNTER — Encounter (HOSPITAL_COMMUNITY): Payer: Self-pay | Admitting: Anesthesiology

## 2023-07-25 ENCOUNTER — Ambulatory Visit (HOSPITAL_COMMUNITY): Admission: RE | Admit: 2023-07-25 | Source: Home / Self Care | Admitting: Cardiovascular Disease

## 2023-07-25 ENCOUNTER — Encounter (HOSPITAL_COMMUNITY): Admission: RE | Payer: Self-pay | Source: Home / Self Care

## 2023-07-25 ENCOUNTER — Other Ambulatory Visit (HOSPITAL_COMMUNITY): Payer: Self-pay | Admitting: *Deleted

## 2023-07-25 DIAGNOSIS — I4819 Other persistent atrial fibrillation: Secondary | ICD-10-CM

## 2023-07-25 SURGERY — CARDIOVERSION (CATH LAB)
Anesthesia: General

## 2023-07-25 NOTE — Anesthesia Preprocedure Evaluation (Signed)
 Anesthesia Evaluation    Reviewed: Allergy & Precautions, Patient's Chart, lab work & pertinent test results  History of Anesthesia Complications Negative for: history of anesthetic complications  Airway        Dental   Pulmonary COPD, former smoker          Cardiovascular hypertension, Pt. on medications + dysrhythmias (on Eliquis) Atrial Fibrillation    TTE 06/21/23: EF 60-65%, moderate LAE, moderate MR, moderate TR, moderate AR, mild to moderate AS     Neuro/Psych  Headaches    GI/Hepatic Neg liver ROS, hiatal hernia,GERD  Medicated,,  Endo/Other  negative endocrine ROS    Renal/GU negative Renal ROS  negative genitourinary   Musculoskeletal  (+) Arthritis ,    Abdominal   Peds  Hematology negative hematology ROS (+)   Anesthesia Other Findings Day of surgery medications reviewed with patient.  Reproductive/Obstetrics                              Anesthesia Physical Anesthesia Plan  ASA: 3  Anesthesia Plan: General   Post-op Pain Management: Minimal or no pain anticipated   Induction: Intravenous  PONV Risk Score and Plan: Treatment may vary due to age or medical condition and Propofol infusion  Airway Management Planned: Mask  Additional Equipment: None  Intra-op Plan:   Post-operative Plan:   Informed Consent:   Plan Discussed with:   Anesthesia Plan Comments:          Anesthesia Quick Evaluation

## 2023-07-26 NOTE — Progress Notes (Signed)
 Called patient with pre-procedure instructions for tomorrow.   Patient informed of:   Time to arrive for procedure. 1000 Remain NPO past midnight.  Must have a ride home and a responsible adult to remain with them for 24 hours post procedure.  Confirmed blood thinner. Eliquis Confirmed no breaks in taking blood thinner for 3+ weeks prior to procedure. Confirmed patient stopped all GLP-1s and GLP-2s for at least one week before procedure.

## 2023-07-27 ENCOUNTER — Encounter (HOSPITAL_COMMUNITY): Payer: Self-pay | Admitting: Internal Medicine

## 2023-07-27 ENCOUNTER — Ambulatory Visit (HOSPITAL_COMMUNITY): Admitting: Anesthesiology

## 2023-07-27 ENCOUNTER — Encounter (HOSPITAL_COMMUNITY)
Admission: RE | Disposition: A | Discharge status: Home / Self Care | Payer: Self-pay | Source: Home / Self Care | Attending: Internal Medicine

## 2023-07-27 ENCOUNTER — Other Ambulatory Visit: Payer: Self-pay

## 2023-07-27 ENCOUNTER — Ambulatory Visit (HOSPITAL_COMMUNITY)
Admission: RE | Admit: 2023-07-27 | Discharge: 2023-07-27 | Disposition: A | Discharge status: Home / Self Care | Attending: Internal Medicine | Admitting: Internal Medicine

## 2023-07-27 DIAGNOSIS — M1990 Unspecified osteoarthritis, unspecified site: Secondary | ICD-10-CM | POA: Diagnosis not present

## 2023-07-27 DIAGNOSIS — I4891 Unspecified atrial fibrillation: Secondary | ICD-10-CM

## 2023-07-27 DIAGNOSIS — I10 Essential (primary) hypertension: Secondary | ICD-10-CM | POA: Insufficient documentation

## 2023-07-27 DIAGNOSIS — J449 Chronic obstructive pulmonary disease, unspecified: Secondary | ICD-10-CM | POA: Insufficient documentation

## 2023-07-27 DIAGNOSIS — Z87891 Personal history of nicotine dependence: Secondary | ICD-10-CM | POA: Diagnosis not present

## 2023-07-27 DIAGNOSIS — D6869 Other thrombophilia: Secondary | ICD-10-CM | POA: Diagnosis not present

## 2023-07-27 DIAGNOSIS — Z7901 Long term (current) use of anticoagulants: Secondary | ICD-10-CM | POA: Insufficient documentation

## 2023-07-27 DIAGNOSIS — I4819 Other persistent atrial fibrillation: Secondary | ICD-10-CM | POA: Insufficient documentation

## 2023-07-27 HISTORY — PX: CARDIOVERSION: EP1203

## 2023-07-27 LAB — POCT I-STAT, CHEM 8
BUN: 19 mg/dL (ref 8–23)
Calcium, Ion: 1.26 mmol/L (ref 1.15–1.40)
Chloride: 107 mmol/L (ref 98–111)
Creatinine, Ser: 2.2 mg/dL — ABNORMAL HIGH (ref 0.44–1.00)
Glucose, Bld: 87 mg/dL (ref 70–99)
HCT: 28 % — ABNORMAL LOW (ref 36.0–46.0)
Hemoglobin: 9.5 g/dL — ABNORMAL LOW (ref 12.0–15.0)
Potassium: 3.8 mmol/L (ref 3.5–5.1)
Sodium: 142 mmol/L (ref 135–145)
TCO2: 24 mmol/L (ref 22–32)

## 2023-07-27 SURGERY — CARDIOVERSION (CATH LAB)
Anesthesia: General

## 2023-07-27 MED ORDER — SODIUM CHLORIDE 0.9% FLUSH: 3.0000 mL | INTRAVENOUS | Status: DC | PRN

## 2023-07-27 MED ORDER — SODIUM CHLORIDE 0.9% FLUSH: 3.0000 mL | Freq: Two times a day (BID) | INTRAVENOUS | Status: DC

## 2023-07-27 MED ORDER — PROPOFOL 10 MG/ML IV BOLUS
INTRAVENOUS | Status: DC | PRN
  Administered 2023-07-27: 30 mg via INTRAVENOUS

## 2023-07-27 SURGICAL SUPPLY — 1 items: PAD DEFIB RADIO PHYSIO CONN (PAD) ×2 IMPLANT

## 2023-07-27 NOTE — CV Procedure (Signed)
    CARDIOVERSION NOTE  Procedure: Electrical Cardioversion Indications:  Atrial Fibrillation  Procedure Details:  Consent: Risks of procedure as well as the alternatives and risks of each were explained to the (patient/caregiver).  Consent for procedure obtained.  Time Out: Verified patient identification, verified procedure, site/side was marked, verified correct patient position, special equipment/implants available, medications/allergies/relevent history reviewed, required imaging and test results available.  Performed  Patient placed on cardiac monitor, pulse oximetry, supplemental oxygen as necessary.  Sedation given:  propofol per anesthesia Pacer pads placed anterior and posterior chest.  Cardioverted 1 time(s).  Cardioverted at 200J biphasic.  Impression: Findings: Post procedure EKG shows: NSR Complications: None Patient did tolerate procedure well.  Plan: Successful DCCV with a single 200J biphasic shock to NSR.  Time Spent Directly with the Patient:  30 minutes   Chrystie Nose, MD, [facility], FACP  [place]  [facility]  Medical Director of the [facility] Lipid Disorders &  Cardiovascular Risk Reduction Clinic Diplomate of the American Board of Clinical Lipidology Attending Cardiologist  Direct Dial: [phone]  Fax: [phone]  Website:  [email or web address] Nicely Cael Worth 07/27/2023, 11:15 AM

## 2023-07-27 NOTE — Transfer of Care (Signed)
 Immediate Anesthesia Transfer of Care Note  Patient: Joann Barnes  Procedure(s) Performed: CARDIOVERSION  Patient Location: PACU and Cath Lab  Anesthesia Type:General  Level of Consciousness: awake, alert , and oriented  Airway & Oxygen Therapy: Patient Spontanous Breathing and Patient connected to nasal cannula oxygen  Post-op Assessment: Report given to RN and Post -op Vital signs reviewed and stable  Post vital signs: Reviewed and stable  Last Vitals:  Vitals Value Taken Time  BP    Temp    Pulse 100 07/27/23 1057  Resp 22 07/27/23 1057  SpO2 87 % 07/27/23 1057  Vitals shown include unfiled device data.  Last Pain:  Vitals:   07/27/23 1000  TempSrc:   PainSc: 0-No pain         Complications: No notable events documented.

## 2023-07-27 NOTE — Interval H&P Note (Signed)
 History and Physical Interval Note:  07/27/2023 10:56 AM  Joann Barnes  has presented today for surgery, with the diagnosis of AFIB.  The various methods of treatment have been discussed with the patient and family. After consideration of risks, benefits and other options for treatment, the patient has consented to  Procedure(s): CARDIOVERSION (N/A) as a surgical intervention.  The patient's history has been reviewed, patient examined, no change in status, stable for surgery.  I have reviewed the patient's chart and labs.  Questions were answered to the patient's satisfaction.     Chrystie Nose

## 2023-07-27 NOTE — Discharge Instructions (Signed)

## 2023-07-28 NOTE — Anesthesia Preprocedure Evaluation (Signed)
 Anesthesia Evaluation  Patient identified by MRN, date of birth, ID band Patient awake    Reviewed: Allergy & Precautions, NPO status , Patient's Chart, lab work & pertinent test results  History of Anesthesia Complications Negative for: history of anesthetic complications  Airway Mallampati: III  TM Distance: >3 FB Neck ROM: Full    Dental  (+) Dental Advisory Given   Pulmonary COPD, former smoker   breath sounds clear to auscultation       Cardiovascular hypertension, Pt. on medications + dysrhythmias (on Eliquis) Atrial Fibrillation  Rhythm:Irregular   TTE 06/21/23: EF 60-65%, moderate LAE, moderate MR, moderate TR, moderate AR, mild to moderate AS     Neuro/Psych  Headaches    GI/Hepatic Neg liver ROS, hiatal hernia,GERD  Medicated,,  Endo/Other  negative endocrine ROS    Renal/GU negative Renal ROS  negative genitourinary   Musculoskeletal  (+) Arthritis ,    Abdominal   Peds  Hematology negative hematology ROS (+)   Anesthesia Other Findings Day of surgery medications reviewed with patient.  Reproductive/Obstetrics                              Anesthesia Physical Anesthesia Plan  ASA: 3  Anesthesia Plan: General   Post-op Pain Management: Minimal or no pain anticipated   Induction: Intravenous  PONV Risk Score and Plan: Treatment may vary due to age or medical condition and Propofol infusion  Airway Management Planned: Mask, Nasal Cannula, Simple Face Mask and Natural Airway  Additional Equipment: None  Intra-op Plan:   Post-operative Plan:   Informed Consent: I have reviewed the patients History and Physical, chart, labs and discussed the procedure including the risks, benefits and alternatives for the proposed anesthesia with the patient or authorized representative who has indicated his/her understanding and acceptance.     Dental advisory given  Plan Discussed  with: CRNA  Anesthesia Plan Comments:          Anesthesia Quick Evaluation

## 2023-07-28 NOTE — Anesthesia Postprocedure Evaluation (Signed)
 Anesthesia Post Note  Patient: Joann Barnes  Procedure(s) Performed: CARDIOVERSION     Patient location during evaluation: Cath Lab Anesthesia Type: General Level of consciousness: awake and alert Pain management: pain level controlled Vital Signs Assessment: post-procedure vital signs reviewed and stable Respiratory status: spontaneous breathing, nonlabored ventilation and respiratory function stable Cardiovascular status: blood pressure returned to baseline and stable Postop Assessment: no apparent nausea or vomiting Anesthetic complications: no   No notable events documented.                  Devynn Hessler

## 2023-08-04 ENCOUNTER — Ambulatory Visit (HOSPITAL_COMMUNITY)
Admission: RE | Admit: 2023-08-04 | Discharge: 2023-08-04 | Disposition: A | Discharge status: Home / Self Care | Source: Ambulatory Visit | Attending: Internal Medicine | Admitting: Internal Medicine

## 2023-08-04 VITALS — BP 150/56 | HR 83 | Ht 63.0 in | Wt 123.8 lb

## 2023-08-04 DIAGNOSIS — J449 Chronic obstructive pulmonary disease, unspecified: Secondary | ICD-10-CM | POA: Diagnosis not present

## 2023-08-04 DIAGNOSIS — D6869 Other thrombophilia: Secondary | ICD-10-CM | POA: Diagnosis not present

## 2023-08-04 DIAGNOSIS — Z7901 Long term (current) use of anticoagulants: Secondary | ICD-10-CM | POA: Diagnosis not present

## 2023-08-04 DIAGNOSIS — I129 Hypertensive chronic kidney disease with stage 1 through stage 4 chronic kidney disease, or unspecified chronic kidney disease: Secondary | ICD-10-CM | POA: Diagnosis not present

## 2023-08-04 DIAGNOSIS — I083 Combined rheumatic disorders of mitral, aortic and tricuspid valves: Secondary | ICD-10-CM | POA: Insufficient documentation

## 2023-08-04 DIAGNOSIS — N1832 Chronic kidney disease, stage 3b: Secondary | ICD-10-CM | POA: Insufficient documentation

## 2023-08-04 DIAGNOSIS — E785 Hyperlipidemia, unspecified: Secondary | ICD-10-CM | POA: Diagnosis not present

## 2023-08-04 DIAGNOSIS — I4819 Other persistent atrial fibrillation: Secondary | ICD-10-CM | POA: Diagnosis not present

## 2023-08-04 DIAGNOSIS — Z79899 Other long term (current) drug therapy: Secondary | ICD-10-CM | POA: Diagnosis not present

## 2023-08-04 NOTE — Progress Notes (Signed)
 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 female with a history of HTN, HLD, stage IIIb CKD, GI bleed leading to blood transufion 03/2021, COPD with ongoing tobacco use, hypothyroidism, GERD, coronary artery calcification by CT imaging, and atrial fibrillation who presents for consultation in the [facility]. Seen by Dr. Jacinto Halim on 06/30/23 and noted to be in new Afib with RVR. Started on verapamil SR 180 mg daily. Patient is on Eliquis 2.5 mg BID for a CHADS2VASC score of 5.  On follow up 08/04/23, she is currently in NSR. S/p successful DCCV on 07/27/23. No missed doses of Eliquis 2.5 mg BID.   Today, she denies symptoms of palpitations, chest pain, shortness of breath, orthopnea, PND, lower extremity edema, dizziness, presyncope, syncope, snoring, daytime somnolence, bleeding, or neurologic sequela. The patient is tolerating medications without difficulties and is otherwise without complaint today.    she has a BMI of Body mass index is 21.93 kg/m.Marland Kitchen Filed Weights   08/04/23 1005  Weight: 56.2 kg     Current Outpatient Medications  Medication Sig Dispense Refill   acetaminophen (TYLENOL) 500 MG tablet Take 500-1,000 mg by mouth as needed for mild pain (pain score 1-3) or headache.     albuterol (ACCUNEB) 1.25 MG/3ML nebulizer solution Take 1 ampule by nebulization 2 (two) times daily as needed for wheezing or shortness of breath.     apixaban (ELIQUIS) 2.5 MG TABS tablet Take 1 tablet (2.5 mg total) by mouth 2 (two) times daily. 60 tablet 6   AZO-CRANBERRY PO Take 2 tablets by mouth daily as needed (for urinary symptoms).     Cholecalciferol (VITAMIN D3) 2000 units TABS Take 2,000 Units by mouth daily.     diazepam (VALIUM) 5 MG tablet Take 5 mg by mouth as needed for muscle spasms.     escitalopram (LEXAPRO) 20 MG tablet  Take 20 mg by mouth at bedtime.     famotidine (PEPCID) 40 MG tablet Take 40 mg by mouth 2 (two) times daily.     Fluticasone-Umeclidin-Vilant 100-62.5-25 MCG/INH AEPB Inhale 1 puff into the lungs daily.     furosemide (LASIX) 20 MG tablet Take 20-40 mg by mouth daily as needed for edema.     gabapentin (NEURONTIN) 300 MG capsule Take 300 mg by mouth 3 (three) times daily.     levothyroxine (SYNTHROID) 25 MCG tablet Take 25 mcg by mouth daily before breakfast.     potassium chloride SA (KLOR-CON M) 20 MEQ tablet Take 1 tablet (20 mEq total) by mouth daily. 10 tablet 0   pravastatin (PRAVACHOL) 80 MG tablet Take 80 mg by mouth every evening.     PROAIR HFA 108 (90 Base) MCG/ACT inhaler Inhale 2 puffs into the lungs every 6 (six) hours as needed for wheezing or shortness of breath.     verapamil (CALAN-SR) 180 MG CR tablet Take 1 tablet (180 mg total) by mouth daily. 30 tablet 6   zoledronic acid (RECLAST) 5 MG/100ML SOLN injection Inject 5 mg into the vein once. Takes once a year     No current facility-administered medications for this encounter.    Atrial Fibrillation Management history:  Previous antiarrhythmic drugs: none Previous cardioversions: 07/27/23 Previous ablations: none Anticoagulation history: Eliquis 2.5 mg BID   ROS- All systems are reviewed and negative except as per the HPI above.  Physical Exam: BP (!) 150/56   Pulse 83   Ht 5\' 3"  (1.6 m)   Wt 56.2 kg   BMI 21.93 kg/m   GEN- The patient is well appearing, alert and oriented x 3 today.   Neck - no JVD or carotid bruit noted Lungs- Clear to ausculation bilaterally, normal work of breathing Heart- Regular rate and rhythm, no murmurs, rubs or gallops, PMI not laterally displaced Extremities- no clubbing, cyanosis, or edema Skin - no rash or ecchymosis noted   EKG today demonstrates  Vent. rate 83 BPM PR interval 176 ms QRS duration 114 ms QT/QTcB 394/462 ms P-R-T axes 82 -50 80 Sinus rhythm with marked  sinus arrhythmia Left axis deviation Minimal voltage criteria for LVH, may be normal variant ( Cornell product ) Septal infarct , age undetermined Abnormal ECG When compared with ECG of 27-Jul-2023 11:09, PREVIOUS ECG IS PRESENT  Echo 06/21/23 demonstrated  1. Left ventricular ejection fraction, by estimation, is 60 to 65%. The  left ventricle has normal function. The left ventricle has no regional  wall motion abnormalities. Left ventricular diastolic function could not  be evaluated. Elevated left atrial  pressure.   2. Right ventricular systolic function is normal. The right ventricular  size is normal.   3. Left atrial size was moderately dilated.   4. The mitral valve is normal in structure. Moderate mitral valve  regurgitation. No evidence of mitral stenosis.   5. Tricuspid valve regurgitation is moderate.   6. The aortic valve is calcified. Aortic valve regurgitation is moderate.  Mild to moderate aortic valve stenosis.   7. The inferior vena cava is normal in size with greater than 50%  respiratory variability, suggesting right atrial pressure of 3 mmHg.    ASSESSMENT & PLAN CHA2DS2-VASc Score = 5  The patient's score is based upon: CHF History: 0 HTN History: 1 Diabetes History: 0 Stroke History: 0 Vascular Disease History: 1 Age Score: 2 Gender Score: 1       ASSESSMENT AND PLAN: Persistent Atrial Fibrillation (ICD10:  I48.19) The patient's CHA2DS2-VASc score is 5, indicating a 7.2% annual risk of stroke.   S/p successful DCCV on 07/27/23.  She is currently in NSR. Continue verapamil 180 mg daily.    Secondary Hypercoagulable State (ICD10:  D68.69) The patient is at significant risk for stroke/thromboembolism based upon her CHA2DS2-VASc Score of 5.  Continue Apixaban (Eliquis).  No missed doses. Continue without interruption   Follow up with Dr. Jacinto Halim as scheduled. Follow up Afib clinic prn.   Lake Bells, PA-C  Afib Clinic [facility] [address] [place], [place] [address] [phone]

## 2023-08-09 ENCOUNTER — Telehealth: Payer: Self-pay | Admitting: Cardiology

## 2023-08-09 NOTE — Telephone Encounter (Signed)
 Pt called to report that she has been having worsening SOB and dizziness... she is unable to walk from the kitchen to bedroom without having to stop and pant... she has not had to elevate the head of her bed, and no edema...  She thinks it is the medication since it started when she made med changes:   08/04/23 Started on verapamil SR 180 mg daily. Patient is on Eliquis 2.5 mg BID for a CHADS2VASC score of 5.   She denies chest pain and does not fel like her heart is beating irregular... she is not sure what her BP is.   Will send to the Afib Clinic and Dr Jacinto Halim for review.

## 2023-08-09 NOTE — Telephone Encounter (Signed)
 Pt c/o medication issue:  1. Name of Medication:   apixaban (ELIQUIS) 2.5 MG TABS tablet    2. How are you currently taking this medication (dosage and times per day)?    3. Are you having a reaction (difficulty breathing--STAT)? no  4. What is your medication issue? Patient states that she believes the medication is causing her to feel dizzy. Calling to see what the dr thinks. Please advise

## 2023-08-12 NOTE — Telephone Encounter (Signed)
 Her shortness of breath and dizziness is related to new onset atrial fibrillation with rapid ventricular response.  And she may have had recurrence of atrial fibrillation.  She has had cardioversion recently.  Best for her to see A-fib clinic to see if she has recurrence.  She also needs a CBC.  She has history of GI bleed.  Hope she has quit smoking.

## 2023-08-12 NOTE — Telephone Encounter (Signed)
 I spoke with patient and gave her message from Dr Jacinto Halim.  She has cut back on smoking but still smokes a little. Appointment made for patient in Afib clinic on 4/14 at 1:30 Will need CBC at this visit

## 2023-08-15 ENCOUNTER — Ambulatory Visit (HOSPITAL_COMMUNITY)
Admission: RE | Admit: 2023-08-15 | Discharge: 2023-08-15 | Disposition: A | Discharge status: Home / Self Care | Source: Ambulatory Visit | Attending: Physician Assistant | Admitting: Physician Assistant

## 2023-08-15 VITALS — BP 142/56 | HR 73 | Ht 63.0 in | Wt 124.0 lb

## 2023-08-15 DIAGNOSIS — I129 Hypertensive chronic kidney disease with stage 1 through stage 4 chronic kidney disease, or unspecified chronic kidney disease: Secondary | ICD-10-CM | POA: Insufficient documentation

## 2023-08-15 DIAGNOSIS — N1832 Chronic kidney disease, stage 3b: Secondary | ICD-10-CM | POA: Diagnosis not present

## 2023-08-15 DIAGNOSIS — F17200 Nicotine dependence, unspecified, uncomplicated: Secondary | ICD-10-CM | POA: Diagnosis not present

## 2023-08-15 DIAGNOSIS — K219 Gastro-esophageal reflux disease without esophagitis: Secondary | ICD-10-CM | POA: Insufficient documentation

## 2023-08-15 DIAGNOSIS — D6869 Other thrombophilia: Secondary | ICD-10-CM | POA: Diagnosis not present

## 2023-08-15 DIAGNOSIS — Z79899 Other long term (current) drug therapy: Secondary | ICD-10-CM | POA: Diagnosis not present

## 2023-08-15 DIAGNOSIS — Z79890 Hormone replacement therapy (postmenopausal): Secondary | ICD-10-CM | POA: Insufficient documentation

## 2023-08-15 DIAGNOSIS — I498 Other specified cardiac arrhythmias: Secondary | ICD-10-CM | POA: Insufficient documentation

## 2023-08-15 DIAGNOSIS — E785 Hyperlipidemia, unspecified: Secondary | ICD-10-CM | POA: Insufficient documentation

## 2023-08-15 DIAGNOSIS — Z7901 Long term (current) use of anticoagulants: Secondary | ICD-10-CM | POA: Insufficient documentation

## 2023-08-15 DIAGNOSIS — Z8719 Personal history of other diseases of the digestive system: Secondary | ICD-10-CM | POA: Insufficient documentation

## 2023-08-15 DIAGNOSIS — E039 Hypothyroidism, unspecified: Secondary | ICD-10-CM | POA: Insufficient documentation

## 2023-08-15 DIAGNOSIS — R195 Other fecal abnormalities: Secondary | ICD-10-CM

## 2023-08-15 DIAGNOSIS — J449 Chronic obstructive pulmonary disease, unspecified: Secondary | ICD-10-CM | POA: Diagnosis not present

## 2023-08-15 DIAGNOSIS — I444 Left anterior fascicular block: Secondary | ICD-10-CM | POA: Diagnosis not present

## 2023-08-15 DIAGNOSIS — I4819 Other persistent atrial fibrillation: Secondary | ICD-10-CM | POA: Insufficient documentation

## 2023-08-15 DIAGNOSIS — I2510 Atherosclerotic heart disease of native coronary artery without angina pectoris: Secondary | ICD-10-CM | POA: Diagnosis not present

## 2023-08-15 LAB — CBC
HCT: 30.5 % — ABNORMAL LOW (ref 36.0–46.0)
Hemoglobin: 9.7 g/dL — ABNORMAL LOW (ref 12.0–15.0)
MCH: 32.4 pg (ref 26.0–34.0)
MCHC: 31.8 g/dL (ref 30.0–36.0)
MCV: 102 fL — ABNORMAL HIGH (ref 80.0–100.0)
Platelets: 329 10*3/uL (ref 150–400)
RBC: 2.99 MIL/uL — ABNORMAL LOW (ref 3.87–5.11)
RDW: 15.9 % — ABNORMAL HIGH (ref 11.5–15.5)
WBC: 8 10*3/uL (ref 4.0–10.5)
nRBC: 0 % (ref 0.0–0.2)

## 2023-08-15 MED ORDER — VERAPAMIL HCL ER 120 MG PO TBCR: 120.0000 mg | EXTENDED_RELEASE_TABLET | Freq: Every day | ORAL | 6 refills | Status: DC

## 2023-08-15 NOTE — Progress Notes (Signed)
 Primary Care Physician: Georgianne Fick, MD Primary Cardiologist: Yates Decamp, MD Electrophysiologist: None  Referring Physician: Dr. Vincente Poli is a 83 y.o. female with a history of HTN, HLD, CAD, stage IIIb CKD, GI bleed leading to blood transufion 03/2021, COPD with ongoing tobacco use, hypothyroidism, GERD, coronary artery calcification by CT imaging, and atrial fibrillation who presents for consultation in the [facility]. Seen by Dr. Jacinto Halim on 06/30/23 and noted to be in new Afib with RVR. Started on verapamil SR 180 mg daily. Patient is on Eliquis 2.5 mg BID for stroke prevention. S/p successful DCCV on 07/27/23.   Patient returns for follow up for atrial fibrillation. Patient reports that she has had worsening dizziness since starting Eliquis and verapamil, especially at night. She did have SOB post DCCV but this has resolved. She also report dark stools about one week ago but admits she had been taking Pepto Bismol also for upset stomach. Her stool has been lighter colored the last few days, no bright red blood. She remains in SR.   Today, she  denies symptoms of palpitations, chest pain, shortness of breath, orthopnea, PND, lower extremity edema, presyncope, syncope, snoring, daytime somnolence, bleeding, or neurologic sequela. The patient is tolerating medications without difficulties and is otherwise without complaint today.    she has a BMI of Body mass index is 21.97 kg/m.Marland Kitchen Filed Weights   08/15/23 1318  Weight: 56.2 kg      Current Outpatient Medications  Medication Sig Dispense Refill   acetaminophen (TYLENOL) 500 MG tablet Take 500-1,000 mg by mouth as needed for mild pain (pain score 1-3) or headache.     albuterol (ACCUNEB) 1.25 MG/3ML nebulizer solution Take 1 ampule by nebulization 2 (two) times daily as needed for wheezing or shortness of breath.     apixaban (ELIQUIS) 2.5 MG TABS tablet Take 1 tablet (2.5 mg total) by  mouth 2 (two) times daily. 60 tablet 6   AZO-CRANBERRY PO Take 2 tablets by mouth daily as needed (for urinary symptoms).     Cholecalciferol (VITAMIN D3) 2000 units TABS Take 2,000 Units by mouth daily.     diazepam (VALIUM) 5 MG tablet Take 5 mg by mouth as needed for muscle spasms.     escitalopram (LEXAPRO) 20 MG tablet Take 20 mg by mouth at bedtime.     famotidine (PEPCID) 40 MG tablet Take 40 mg by mouth 2 (two) times daily.     Fluticasone-Umeclidin-Vilant 100-62.5-25 MCG/INH AEPB Inhale 1 puff into the lungs daily.     furosemide (LASIX) 20 MG tablet Take 20-40 mg by mouth daily as needed for edema.     gabapentin (NEURONTIN) 300 MG capsule Take 300 mg by mouth 3 (three) times daily.     levothyroxine (SYNTHROID) 25 MCG tablet Take 25 mcg by mouth daily before breakfast.     pravastatin (PRAVACHOL) 80 MG tablet Take 80 mg by mouth every evening.     PROAIR HFA 108 (90 Base) MCG/ACT inhaler Inhale 2 puffs into the lungs every 6 (six) hours as needed for wheezing or shortness of breath.     verapamil (CALAN-SR) 180 MG CR tablet Take 1 tablet (180 mg total) by mouth daily. 30 tablet 6   zoledronic acid (RECLAST) 5 MG/100ML SOLN injection Inject 5 mg into the vein once. Takes once a year     potassium chloride SA (KLOR-CON M) 20 MEQ tablet Take 1 tablet (20 mEq total) by  mouth daily. (Patient not taking: Reported on 08/15/2023) 10 tablet 0   No current facility-administered medications for this encounter.    Atrial Fibrillation Management history:  Previous antiarrhythmic drugs: none Previous cardioversions: 07/27/23 Previous ablations: none Anticoagulation history: Eliquis 2.5 mg BID   ROS- All systems are reviewed and negative except as per the HPI above.  Physical Exam: BP (!) 142/56   Pulse 73   Ht 5\' 3"  (1.6 m)   Wt 56.2 kg   BMI 21.97 kg/m   GEN: Well nourished, well developed in no acute distress CARDIAC: Regular rate and rhythm, no murmurs, rubs, gallops RESPIRATORY:   Clear to auscultation without rales, wheezing or rhonchi  ABDOMEN: Soft, non-tender, non-distended EXTREMITIES:  No edema; No deformity    EKG today demonstrates  SR, LAFB Vent. rate 73 BPM PR interval 180 ms QRS duration 112 ms QT/QTcB 424/467 ms   Echo 06/21/23 demonstrated  1. Left ventricular ejection fraction, by estimation, is 60 to 65%. The  left ventricle has normal function. The left ventricle has no regional  wall motion abnormalities. Left ventricular diastolic function could not  be evaluated. Elevated left atrial pressure.   2. Right ventricular systolic function is normal. The right ventricular  size is normal.   3. Left atrial size was moderately dilated.   4. The mitral valve is normal in structure. Moderate mitral valve  regurgitation. No evidence of mitral stenosis.   5. Tricuspid valve regurgitation is moderate.   6. The aortic valve is calcified. Aortic valve regurgitation is moderate.  Mild to moderate aortic valve stenosis.   7. The inferior vena cava is normal in size with greater than 50%  respiratory variability, suggesting right atrial pressure of 3 mmHg.    CHA2DS2-VASc Score = 5  The patient's score is based upon: CHF History: 0 HTN History: 1 Diabetes History: 0 Stroke History: 0 Vascular Disease History: 1 Age Score: 2 Gender Score: 1       ASSESSMENT AND PLAN: Persistent Atrial Fibrillation (ICD10:  I48.19) The patient's CHA2DS2-VASc score is 5, indicating a 7.2% annual risk of stroke.   S/p DCCV 07/27/23 Fortunately, patient remains in SR.  Would avoid class IC with h/o coronary calcifications. Her CrCl is too low for dofetilide or sotalol. If AAD is needed, amiodarone may be her best option despite her baseline lung disease.  Continue Eliquis 2.5 mg BID Will decrease verapamil to 120 mg daily to see if this helps with dizziness.   Secondary Hypercoagulable State (ICD10:  D68.69) The patient is at significant risk for  stroke/thromboembolism based upon her CHA2DS2-VASc Score of 5.  Continue Apixaban (Eliquis).   HTN Decrease verapamil as above.  Dark stools ? Related to Pepto Bismol use but also concerning given her h/o GI bleeding. Dark stools have resolved in the past two days.  Check CBC today. She has been followed by Dr Tova Fresh in the past.    Follow up with Dr Berry Bristol as scheduled.     Myrtha Ates PA-C Afib [facility] [address] [place], [place] [address] [phone]

## 2023-08-15 NOTE — Patient Instructions (Signed)
 Verapamil- Decrease dose to 120 mg- Take 1 tablet daily

## 2023-08-24 DIAGNOSIS — J432 Centrilobular emphysema: Secondary | ICD-10-CM | POA: Diagnosis not present

## 2023-08-24 DIAGNOSIS — E782 Mixed hyperlipidemia: Secondary | ICD-10-CM | POA: Diagnosis not present

## 2023-08-24 DIAGNOSIS — E039 Hypothyroidism, unspecified: Secondary | ICD-10-CM | POA: Diagnosis not present

## 2023-08-24 DIAGNOSIS — D500 Iron deficiency anemia secondary to blood loss (chronic): Secondary | ICD-10-CM | POA: Diagnosis not present

## 2023-08-24 DIAGNOSIS — N1832 Chronic kidney disease, stage 3b: Secondary | ICD-10-CM | POA: Diagnosis not present

## 2023-08-24 DIAGNOSIS — I10 Essential (primary) hypertension: Secondary | ICD-10-CM | POA: Diagnosis not present

## 2023-08-31 DIAGNOSIS — I350 Nonrheumatic aortic (valve) stenosis: Secondary | ICD-10-CM | POA: Diagnosis not present

## 2023-08-31 DIAGNOSIS — E039 Hypothyroidism, unspecified: Secondary | ICD-10-CM | POA: Diagnosis not present

## 2023-08-31 DIAGNOSIS — I6523 Occlusion and stenosis of bilateral carotid arteries: Secondary | ICD-10-CM | POA: Diagnosis not present

## 2023-08-31 DIAGNOSIS — D692 Other nonthrombocytopenic purpura: Secondary | ICD-10-CM | POA: Diagnosis not present

## 2023-08-31 DIAGNOSIS — S51812A Laceration without foreign body of left forearm, initial encounter: Secondary | ICD-10-CM | POA: Diagnosis not present

## 2023-08-31 DIAGNOSIS — E538 Deficiency of other specified B group vitamins: Secondary | ICD-10-CM | POA: Diagnosis not present

## 2023-08-31 DIAGNOSIS — E782 Mixed hyperlipidemia: Secondary | ICD-10-CM | POA: Diagnosis not present

## 2023-08-31 DIAGNOSIS — I700 Atherosclerosis of aorta: Secondary | ICD-10-CM | POA: Diagnosis not present

## 2023-08-31 DIAGNOSIS — N1832 Chronic kidney disease, stage 3b: Secondary | ICD-10-CM | POA: Diagnosis not present

## 2023-08-31 DIAGNOSIS — M810 Age-related osteoporosis without current pathological fracture: Secondary | ICD-10-CM | POA: Diagnosis not present

## 2023-08-31 DIAGNOSIS — J432 Centrilobular emphysema: Secondary | ICD-10-CM | POA: Diagnosis not present

## 2023-08-31 DIAGNOSIS — D500 Iron deficiency anemia secondary to blood loss (chronic): Secondary | ICD-10-CM | POA: Diagnosis not present

## 2023-09-09 DIAGNOSIS — S50912A Unspecified superficial injury of left forearm, initial encounter: Secondary | ICD-10-CM | POA: Diagnosis not present

## 2023-09-09 DIAGNOSIS — T148XXA Other injury of unspecified body region, initial encounter: Secondary | ICD-10-CM | POA: Diagnosis not present

## 2023-09-12 ENCOUNTER — Ambulatory Visit: Payer: PPO | Admitting: Cardiology

## 2023-10-23 NOTE — Progress Notes (Signed)
 Cardiology Office Note:  .   Date:  10/24/2023  ID:  Joann Barnes, DOB 1941/02/24, MRN [id number] PCP: Verdia Lombard, MD  [facility] Providers Cardiologist:  Gordy Bergamo, MD   History of Present Illness: .   Joann Barnes is a 83 y.o. female with a history of HTN, HLD, CAD, stage IV CKD, GI bleed leading to blood transufion 03/2021, COPD with ongoing tobacco use, hypothyroidism, GERD, coronary artery calcification by CT imaging, and new onset atrial fibrillation with RVR on 06/30/2023, started on Eliquis  and underwent successful DCCV on 07/27/23.  Patient is currently doing well and is tolerating all the medications except she has noticed dizziness with Eliquis .  Patient states that every time she takes her medical disease  Discussed the use of AI scribe software for clinical note transcription with the patient, who gave verbal consent to proceed.  History of Present Illness Joann Barnes is an 83 year old female with atrial fibrillation who presents with dizziness and lightheadedness. Dizziness and lightheadedness occur with each dose of Eliquis , accompanied by a cold sensation in the face and a sense of impending fainting. These symptoms are absent when Eliquis  is not taken.  Current medications include Eliquis  twice daily, verapamil  120 mg at night, Levoxyl  in the morning, Lexapro  at night, and gabapentin  in the evening. The verapamil  dose was recently reduced from 180 mg. Gabapentin  is taken once or twice daily as needed due to kidney concerns.  She continues to smoke but is attempting to reduce intake using CBD gummies and a 'quit stick'. No bleeding is reported, and she is not taking aspirin , Motrin , Aleve, or prednisone . Stool is not black unless Pepto Bismol is taken.  Labs   Lab Results  Component Value Date   NA 142 07/27/2023   K 3.8 07/27/2023   CO2 21 (L) 07/21/2023   GLUCOSE 87 07/27/2023   BUN 19 07/27/2023   CREATININE 2.20 (H) 07/27/2023   CALCIUM  8.7 (L)  07/21/2023   EGFR 38.0 04/11/2023   GFRNONAA 21 (L) 07/21/2023      Latest Ref Rng & Units 07/27/2023   10:24 AM 07/21/2023   10:07 AM 03/06/2021   10:50 AM  BMP  Glucose 70 - 99 mg/dL 87  94  88   BUN 8 - 23 mg/dL 19  15  18    Creatinine 0.44 - 1.00 mg/dL 7.79  7.75  8.79   Sodium 135 - 145 mmol/L 142  141  141   Potassium 3.5 - 5.1 mmol/L 3.8  3.4  3.9   Chloride 98 - 111 mmol/L 107  111  106   CO2 22 - 32 mmol/L  21    Calcium  8.9 - 10.3 mg/dL  8.7        Latest Ref Rng & Units 08/15/2023    1:25 PM 07/27/2023   10:24 AM 07/21/2023   10:07 AM  CBC  WBC 4.0 - 10.5 K/uL 8.0   7.7   Hemoglobin 12.0 - 15.0 g/dL 9.7  9.5  89.8   Hematocrit 36.0 - 46.0 % 30.5  28.0  31.0   Platelets 150 - 400 K/uL 329   317    No results found for: HGBA1C  Lab Results  Component Value Date   TSH 1.722 02/17/2020    ROS  Review of Systems  Cardiovascular:  Positive for near-syncope. Negative for chest pain, dyspnea on exertion and leg swelling.  Neurological:  Positive for dizziness.   Physical Exam:  VS:  BP 138/62 (BP Location: Left Arm, Patient Position: Sitting, Cuff Size: Normal)   Pulse 62   Resp 16   Ht 5' 3 (1.6 m)   Wt 125 lb (56.7 kg)   SpO2 92%   BMI 22.14 kg/m    Wt Readings from Last 3 Encounters:  10/24/23 125 lb (56.7 kg)  08/15/23 124 lb (56.2 kg)  08/04/23 123 lb 12.8 oz (56.2 kg)    Physical Exam Neck:     Vascular: No JVD.   Cardiovascular:     Rate and Rhythm: Normal rate and regular rhythm.     Pulses: Intact distal pulses.     Heart sounds: S1 normal and S2 normal. Murmur heard.     Early systolic murmur is present with a grade of 2/6 at the upper right sternal border.     No gallop.  Pulmonary:     Effort: Pulmonary effort is normal.     Breath sounds: Normal breath sounds.  Abdominal:     General: Bowel sounds are normal.     Palpations: Abdomen is soft.   Musculoskeletal:     Right lower leg: No edema.     Left lower leg: No edema.     Studies Reviewed: [name]    ECHOCARDIOGRAM COMPLETE 06/21/2023  1. Left ventricular ejection fraction, by estimation, is 60 to 65%. The left ventricle has normal function. The left ventricle has no regional wall motion abnormalities. Left ventricular diastolic function could not be evaluated. Elevated left atrial pressure. 2. Right ventricular systolic function is normal. The right ventricular size is normal. 3. Left atrial size was moderately dilated. 4. The mitral valve is normal in structure. Moderate mitral valve regurgitation. No evidence of mitral stenosis. 5. Tricuspid valve regurgitation is moderate.  RVSP mildly elevated at 36 mmHg. 6. The aortic valve is calcified. Aortic valve regurgitation is moderate. Mild to moderate aortic valve stenosis. 7. The inferior vena cava is normal in size with greater than 50% respiratory variability, suggesting right atrial pressure of 3 mmHg.  EKG:       EKG personally reviewed 08/15/2023: Normal sinus rhythm with sinus rhythm at rate of 73 bpm, left anterior fascicular block.  Poor R progression, cannot exclude anteroseptal infarct old.  Normal QT interval.  Compared to 08/04/2023, no change.  Medications ordered    Meds ordered this encounter  Medications   Rivaroxaban (XARELTO) 15 MG TABS tablet    Sig: Take 1 tablet (15 mg total) by mouth daily with supper.    Dispense:  30 tablet    Refill:  6    Patient to stop Eliquis      ASSESSMENT AND PLAN: .      ICD-10-CM   1. Paroxysmal atrial fibrillation (HCC)  I48.0 Rivaroxaban (XARELTO) 15 MG TABS tablet    2. Dizziness  R42     3. Primary hypertension  I10     4. H/O: GI bleed  Z87.19      Assessment & Plan Atrial Fibrillation Maintaining regular rhythm with no episodes of rapid heart rate. Current treatment includes anticoagulation and rate control medications. Dizziness suspected to be related to Eliquis  ??, prompting a change to Xarelto 15 mg at night. - Change anticoagulation from  Eliquis  to Xarelto, to be taken once daily at night after finishing current Eliquis  supply. - Monitor for signs of bleeding, such as hematochezia or melena. - Advise to report persistent dizziness despite medication change.  Dizziness due to medication Dizziness occurs after taking  Eliquis , with symptoms of lightheadedness and feeling faint. Differential includes verapamil  as a potential cause, but dizziness is not present when Eliquis  is not taken. Decision made to switch to Xarelto to assess if dizziness resolves. - Change anticoagulation from Eliquis  to Xarelto to assess if dizziness resolves. - Advise to report if dizziness persists after medication change.  Hypertension Blood pressure is well controlled with current regimen of verapamil  120 mg taken in the evening. - Continue verapamil  120 mg in the evening.  Gastrointestinal Bleeding No current signs of gastrointestinal bleeding. Advised to monitor for any signs of bleeding, especially with anticoagulation therapy. - Monitor for signs of gastrointestinal bleeding, such as hematochezia or melena. - Avoid NSAIDs and other medications that may increase bleeding risk.  Smoking Continues to smoke but is using aids such as CBD gummies and a quit stick to reduce smoking. Encouraged to quit smoking to improve overall health. - Encourage continued use of smoking cessation aids. - Advise to quit smoking to improve respiratory and cardiovascular health. - OV in 6 months   Signed,  Gordy Bergamo, MD, [facility] 10/24/2023, 10:24 AM [facility] [address] [place], [place] [address] Phone: [phone]. Fax:  [phone]

## 2023-10-24 ENCOUNTER — Encounter: Payer: Self-pay | Admitting: Cardiology

## 2023-10-24 ENCOUNTER — Ambulatory Visit: Attending: Cardiology | Admitting: Cardiology

## 2023-10-24 VITALS — BP 138/62 | HR 62 | Resp 16 | Ht 63.0 in | Wt 125.0 lb

## 2023-10-24 DIAGNOSIS — I480 Paroxysmal atrial fibrillation: Secondary | ICD-10-CM | POA: Diagnosis not present

## 2023-10-24 DIAGNOSIS — R42 Dizziness and giddiness: Secondary | ICD-10-CM

## 2023-10-24 DIAGNOSIS — I10 Essential (primary) hypertension: Secondary | ICD-10-CM | POA: Diagnosis not present

## 2023-10-24 DIAGNOSIS — I351 Nonrheumatic aortic (valve) insufficiency: Secondary | ICD-10-CM

## 2023-10-24 DIAGNOSIS — Z8719 Personal history of other diseases of the digestive system: Secondary | ICD-10-CM | POA: Diagnosis not present

## 2023-10-24 MED ORDER — RIVAROXABAN 15 MG PO TABS: 15.0000 mg | ORAL_TABLET | Freq: Every day | ORAL | 6 refills | Status: DC

## 2023-10-24 NOTE — Patient Instructions (Signed)
 Medication Instructions:  Your physician has recommended you make the following change in your medication:  Stop Eliquis  Start Xarelto 15 mg by mouth daily with evening meal  *If you need a refill on your cardiac medications before your next appointment, please call your pharmacy*  Lab Work: none If you have labs (blood work) drawn today and your tests are completely normal, you will receive your results only by: MyChart Message (if you have MyChart) OR A paper copy in the mail If you have any lab test that is abnormal or we need to change your treatment, we will call you to review the results.  Testing/Procedures: none  Follow-Up: At [facility], you and your health needs are our priority.  As part of our continuing mission to provide you with exceptional heart care, our providers are all part of one team.  This team includes your primary Cardiologist (physician) and Advanced Practice Providers or APPs (Physician Assistants and Nurse Practitioners) who all work together to provide you with the care you need, when you need it.  Your next appointment:   6 month(s)  Provider:   Gordy Bergamo, MD    We recommend signing up for the patient portal called MyChart.  Sign up information is provided on this After Visit Summary.  MyChart is used to connect with patients for Virtual Visits (Telemedicine).  Patients are able to view lab/test results, encounter notes, upcoming appointments, etc.  Non-urgent messages can be sent to your provider as well.   To learn more about what you can do with MyChart, go to [email or web address].   Other Instructions

## 2023-10-25 DIAGNOSIS — H04123 Dry eye syndrome of bilateral lacrimal glands: Secondary | ICD-10-CM | POA: Diagnosis not present

## 2023-10-25 DIAGNOSIS — H40013 Open angle with borderline findings, low risk, bilateral: Secondary | ICD-10-CM | POA: Diagnosis not present

## 2023-11-02 DIAGNOSIS — N184 Chronic kidney disease, stage 4 (severe): Secondary | ICD-10-CM | POA: Diagnosis not present

## 2023-11-02 DIAGNOSIS — N390 Urinary tract infection, site not specified: Secondary | ICD-10-CM | POA: Diagnosis not present

## 2023-11-03 ENCOUNTER — Other Ambulatory Visit: Payer: Self-pay

## 2023-11-03 ENCOUNTER — Emergency Department (HOSPITAL_COMMUNITY)

## 2023-11-03 ENCOUNTER — Inpatient Hospital Stay (HOSPITAL_COMMUNITY)
Admission: EM | Admit: 2023-11-03 | Discharge: 2023-11-05 | DRG: 378 | Discharge status: Against Medical Advice | Attending: Internal Medicine | Admitting: Internal Medicine

## 2023-11-03 ENCOUNTER — Encounter (HOSPITAL_COMMUNITY): Payer: Self-pay | Admitting: *Deleted

## 2023-11-03 DIAGNOSIS — J449 Chronic obstructive pulmonary disease, unspecified: Secondary | ICD-10-CM | POA: Diagnosis not present

## 2023-11-03 DIAGNOSIS — I480 Paroxysmal atrial fibrillation: Secondary | ICD-10-CM | POA: Diagnosis not present

## 2023-11-03 DIAGNOSIS — F17210 Nicotine dependence, cigarettes, uncomplicated: Secondary | ICD-10-CM | POA: Diagnosis present

## 2023-11-03 DIAGNOSIS — I10 Essential (primary) hypertension: Secondary | ICD-10-CM | POA: Diagnosis present

## 2023-11-03 DIAGNOSIS — D539 Nutritional anemia, unspecified: Secondary | ICD-10-CM | POA: Diagnosis present

## 2023-11-03 DIAGNOSIS — K449 Diaphragmatic hernia without obstruction or gangrene: Secondary | ICD-10-CM | POA: Diagnosis present

## 2023-11-03 DIAGNOSIS — I700 Atherosclerosis of aorta: Secondary | ICD-10-CM | POA: Diagnosis not present

## 2023-11-03 DIAGNOSIS — K31811 Angiodysplasia of stomach and duodenum with bleeding: Secondary | ICD-10-CM | POA: Diagnosis not present

## 2023-11-03 DIAGNOSIS — N1832 Chronic kidney disease, stage 3b: Secondary | ICD-10-CM | POA: Diagnosis present

## 2023-11-03 DIAGNOSIS — E039 Hypothyroidism, unspecified: Secondary | ICD-10-CM | POA: Diagnosis present

## 2023-11-03 DIAGNOSIS — K31819 Angiodysplasia of stomach and duodenum without bleeding: Secondary | ICD-10-CM | POA: Diagnosis not present

## 2023-11-03 DIAGNOSIS — Z604 Social exclusion and rejection: Secondary | ICD-10-CM | POA: Diagnosis present

## 2023-11-03 DIAGNOSIS — I129 Hypertensive chronic kidney disease with stage 1 through stage 4 chronic kidney disease, or unspecified chronic kidney disease: Secondary | ICD-10-CM | POA: Diagnosis present

## 2023-11-03 DIAGNOSIS — Z7951 Long term (current) use of inhaled steroids: Secondary | ICD-10-CM

## 2023-11-03 DIAGNOSIS — Z79899 Other long term (current) drug therapy: Secondary | ICD-10-CM

## 2023-11-03 DIAGNOSIS — D62 Acute posthemorrhagic anemia: Secondary | ICD-10-CM | POA: Diagnosis present

## 2023-11-03 DIAGNOSIS — I4891 Unspecified atrial fibrillation: Secondary | ICD-10-CM | POA: Diagnosis not present

## 2023-11-03 DIAGNOSIS — Z888 Allergy status to other drugs, medicaments and biological substances status: Secondary | ICD-10-CM

## 2023-11-03 DIAGNOSIS — K219 Gastro-esophageal reflux disease without esophagitis: Secondary | ICD-10-CM | POA: Diagnosis present

## 2023-11-03 DIAGNOSIS — R195 Other fecal abnormalities: Secondary | ICD-10-CM | POA: Diagnosis not present

## 2023-11-03 DIAGNOSIS — K5520 Angiodysplasia of colon without hemorrhage: Secondary | ICD-10-CM

## 2023-11-03 DIAGNOSIS — R918 Other nonspecific abnormal finding of lung field: Secondary | ICD-10-CM | POA: Diagnosis not present

## 2023-11-03 DIAGNOSIS — I350 Nonrheumatic aortic (valve) stenosis: Secondary | ICD-10-CM | POA: Diagnosis present

## 2023-11-03 DIAGNOSIS — R0602 Shortness of breath: Secondary | ICD-10-CM | POA: Diagnosis not present

## 2023-11-03 DIAGNOSIS — E785 Hyperlipidemia, unspecified: Secondary | ICD-10-CM | POA: Diagnosis present

## 2023-11-03 DIAGNOSIS — K208 Other esophagitis without bleeding: Secondary | ICD-10-CM | POA: Diagnosis not present

## 2023-11-03 DIAGNOSIS — F419 Anxiety disorder, unspecified: Secondary | ICD-10-CM | POA: Diagnosis present

## 2023-11-03 DIAGNOSIS — I517 Cardiomegaly: Secondary | ICD-10-CM | POA: Diagnosis not present

## 2023-11-03 DIAGNOSIS — Z7901 Long term (current) use of anticoagulants: Secondary | ICD-10-CM | POA: Diagnosis not present

## 2023-11-03 DIAGNOSIS — D649 Anemia, unspecified: Secondary | ICD-10-CM | POA: Diagnosis not present

## 2023-11-03 DIAGNOSIS — M1990 Unspecified osteoarthritis, unspecified site: Secondary | ICD-10-CM | POA: Diagnosis present

## 2023-11-03 DIAGNOSIS — Z79890 Hormone replacement therapy (postmenopausal): Secondary | ICD-10-CM

## 2023-11-03 DIAGNOSIS — F32A Depression, unspecified: Secondary | ICD-10-CM | POA: Diagnosis present

## 2023-11-03 DIAGNOSIS — D509 Iron deficiency anemia, unspecified: Secondary | ICD-10-CM | POA: Diagnosis present

## 2023-11-03 DIAGNOSIS — Z8719 Personal history of other diseases of the digestive system: Secondary | ICD-10-CM

## 2023-11-03 DIAGNOSIS — Z91048 Other nonmedicinal substance allergy status: Secondary | ICD-10-CM

## 2023-11-03 DIAGNOSIS — M810 Age-related osteoporosis without current pathological fracture: Secondary | ICD-10-CM | POA: Diagnosis present

## 2023-11-03 DIAGNOSIS — Z882 Allergy status to sulfonamides status: Secondary | ICD-10-CM

## 2023-11-03 DIAGNOSIS — R531 Weakness: Secondary | ICD-10-CM | POA: Diagnosis not present

## 2023-11-03 DIAGNOSIS — Z8249 Family history of ischemic heart disease and other diseases of the circulatory system: Secondary | ICD-10-CM

## 2023-11-03 DIAGNOSIS — J410 Simple chronic bronchitis: Secondary | ICD-10-CM | POA: Diagnosis not present

## 2023-11-03 DIAGNOSIS — Z885 Allergy status to narcotic agent status: Secondary | ICD-10-CM

## 2023-11-03 DIAGNOSIS — Z8701 Personal history of pneumonia (recurrent): Secondary | ICD-10-CM

## 2023-11-03 LAB — CBC WITH DIFFERENTIAL/PLATELET
Abs Immature Granulocytes: 0.03 10*3/uL (ref 0.00–0.07)
Basophils Absolute: 0.1 10*3/uL (ref 0.0–0.1)
Basophils Relative: 1 %
Eosinophils Absolute: 0.1 10*3/uL (ref 0.0–0.5)
Eosinophils Relative: 1 %
HCT: 20.8 % — ABNORMAL LOW (ref 36.0–46.0)
Hemoglobin: 6 g/dL — CL (ref 12.0–15.0)
Immature Granulocytes: 0 %
Lymphocytes Relative: 16 %
Lymphs Abs: 1.4 10*3/uL (ref 0.7–4.0)
MCH: 24 pg — ABNORMAL LOW (ref 26.0–34.0)
MCHC: 28.8 g/dL — ABNORMAL LOW (ref 30.0–36.0)
MCV: 83.2 fL (ref 80.0–100.0)
Monocytes Absolute: 0.8 10*3/uL (ref 0.1–1.0)
Monocytes Relative: 10 %
Neutro Abs: 6.1 10*3/uL (ref 1.7–7.7)
Neutrophils Relative %: 72 %
Platelets: 365 10*3/uL (ref 150–400)
RBC: 2.5 MIL/uL — ABNORMAL LOW (ref 3.87–5.11)
RDW: 19.6 % — ABNORMAL HIGH (ref 11.5–15.5)
WBC: 8.5 10*3/uL (ref 4.0–10.5)
nRBC: 0 % (ref 0.0–0.2)

## 2023-11-03 LAB — BRAIN NATRIURETIC PEPTIDE: B Natriuretic Peptide: 302.4 pg/mL — ABNORMAL HIGH (ref 0.0–100.0)

## 2023-11-03 LAB — COMPREHENSIVE METABOLIC PANEL WITH GFR
ALT: 10 U/L (ref 0–44)
AST: 15 U/L (ref 15–41)
Albumin: 3.5 g/dL (ref 3.5–5.0)
Alkaline Phosphatase: 38 U/L (ref 38–126)
Anion gap: 9 (ref 5–15)
BUN: 22 mg/dL (ref 8–23)
CO2: 21 mmol/L — ABNORMAL LOW (ref 22–32)
Calcium: 8.9 mg/dL (ref 8.9–10.3)
Chloride: 107 mmol/L (ref 98–111)
Creatinine, Ser: 1.73 mg/dL — ABNORMAL HIGH (ref 0.44–1.00)
GFR, Estimated: 29 mL/min — ABNORMAL LOW (ref >60)
Glucose, Bld: 117 mg/dL — ABNORMAL HIGH (ref 70–99)
Potassium: 4 mmol/L (ref 3.5–5.1)
Sodium: 137 mmol/L (ref 135–145)
Total Bilirubin: 0.4 mg/dL (ref 0.0–1.2)
Total Protein: 5.8 g/dL — ABNORMAL LOW (ref 6.5–8.1)

## 2023-11-03 LAB — PREPARE RBC (CROSSMATCH)

## 2023-11-03 LAB — POC OCCULT BLOOD, ED: Fecal Occult Bld: POSITIVE — AB

## 2023-11-03 MED ORDER — ACETAMINOPHEN 650 MG RE SUPP
650.0000 mg | Freq: Four times a day (QID) | RECTAL | Status: DC | PRN
Start: 2023-11-03 — End: 2023-11-06

## 2023-11-03 MED ORDER — SENNOSIDES-DOCUSATE SODIUM 8.6-50 MG PO TABS
1.0000 | ORAL_TABLET | Freq: Every evening | ORAL | Status: DC | PRN
Start: 2023-11-03 — End: 2023-11-06

## 2023-11-03 MED ORDER — PANTOPRAZOLE SODIUM 40 MG IV SOLR
40.0000 mg | Freq: Two times a day (BID) | INTRAVENOUS | Status: DC
  Administered 2023-11-04 – 2023-11-05 (×3): 40 mg via INTRAVENOUS
  Filled 2023-11-03 (×4): qty 10

## 2023-11-03 MED ORDER — ONDANSETRON HCL 4 MG/2ML IJ SOLN: 4.0000 mg | Freq: Four times a day (QID) | INTRAMUSCULAR | Status: DC | PRN

## 2023-11-03 MED ORDER — ONDANSETRON HCL 4 MG PO TABS
4.0000 mg | ORAL_TABLET | Freq: Four times a day (QID) | ORAL | Status: DC | PRN
Start: 2023-11-03 — End: 2023-11-06

## 2023-11-03 MED ORDER — ACETAMINOPHEN 325 MG PO TABS
650.0000 mg | ORAL_TABLET | Freq: Four times a day (QID) | ORAL | Status: DC | PRN
Start: 2023-11-03 — End: 2023-11-06
  Administered 2023-11-04 (×2): 650 mg via ORAL
  Filled 2023-11-03 (×2): qty 2

## 2023-11-03 MED ORDER — SODIUM CHLORIDE 0.9% FLUSH
3.0000 mL | Freq: Two times a day (BID) | INTRAVENOUS | Status: DC
  Administered 2023-11-04 – 2023-11-05 (×3): 3 mL via INTRAVENOUS

## 2023-11-03 MED ORDER — SODIUM CHLORIDE 0.9% IV SOLUTION: Freq: Once | INTRAVENOUS | Status: DC

## 2023-11-03 MED ORDER — PANTOPRAZOLE SODIUM 40 MG IV SOLR: 80.0000 mg | Freq: Once | INTRAVENOUS | Status: DC

## 2023-11-03 NOTE — Hospital Course (Signed)
 Joann Barnes is a 83 y.o. 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

## 2023-11-03 NOTE — ED Triage Notes (Signed)
 PT arrives via POV. PT states she had some blood work done yesterday at [facility], states she was informed today that her HGB was 6.2. PT reports she has been experiencing sob and she appears pale during triage. She is AxOx4. Reports she is on xarelto .

## 2023-11-03 NOTE — ED Provider Triage Note (Signed)
 Emergency Medicine Provider Triage Evaluation Note  Joann Barnes , a 83 y.o. female  was evaluated in triage.  Pt complains of shortness of breath, abnormal labs.  Patient reports increasing shortness of breath over the last several days and reportedly so by her PCP her hemoglobin is 6.2.  She is currently on Xarelto .  Endorses some darker stools but denies any melena.  No hematochezia, hematemesis, and denies any obvious bright blood per rectum.  Review of Systems  Positive: As above Negative: As above  Physical Exam  BP (!) 124/53 (BP Location: Right Arm)   Pulse 91   Temp 98.5 F (36.9 C)   Resp 16   SpO2 92%  Gen:   Awake, no distress   Resp:  Normal effort, increased coarse lung sound MSK:   Moves extremities without difficulty  Other:  2+ pitting edema bilaterally  Medical Decision Making  Medically screening exam initiated at 5:57 PM.  Appropriate orders placed.  Marsella [name] was informed that the remainder of the evaluation will be completed by another provider, this initial triage assessment does not replace that evaluation, and the importance of remaining in the ED until their evaluation is complete.     Shaquille Janes A, PA-C 11/03/23 1758

## 2023-11-03 NOTE — ED Notes (Signed)
 NT sent to blood bank to get blood.

## 2023-11-03 NOTE — ED Notes (Signed)
 Called and placed PT on monitor with CCMD.

## 2023-11-03 NOTE — H&P (Signed)
 History and Physical    [name]:[id number] DOB: [birth year] DOA: 11/03/2023  PCP: Verdia Lombard, MD  Patient coming from: Home  I have personally briefly reviewed patient's old medical records in [facility]  Chief Complaint: Anemia, fatigue  HPI: Joann Barnes is a 83 y.o. female with medical history significant for PAF on Xarelto , history of GI bleed due to small bowel AVMs, chronic anemia, COPD, CKD stage IIIb, HTN, HLD, hypothyroidism who presented to the ED for evaluation of symptomatic anemia.  Patient was newly diagnosed with atrial fibrillation in February 2025.  She was initially started on anticoagulation with Eliquis .  She underwent cardioversion on 07/27/2023 converting to sinus rhythm.  Patient states that over the last 2 weeks she has had significant lightheadedness when standing and with exertion.  Also has been short of breath with exertion.  She has been more fatigued than usual.  There was concern that this was a side effect of Eliquis  and she was switched to Xarelto  about 1.5 weeks ago.  Patient states that she had labs obtained for her nephrologist before her upcoming appointment.  She was called today that her hemoglobin level was down to 6.2 and advised to come to the ED for further evaluation.  Patient states that she has not seen any obvious bleeding.  She has not seen any blood in her stool or dark black-colored stool.  She does have a history of prior GI bleed from small bowel AVMs treated during small bowel endoscopy in November 2022.  ED Course  Labs/Imaging on admission: I have personally reviewed following labs and imaging studies.  Initial vitals showed BP 124/53, pulse 91, RR 16, temp 98.5 F, SpO2 92% on room air.  Labs show hemoglobin 6.0, platelets 365, WBC 8.5, sodium 137, potassium 4.0, bicarb 21, BUN 22, creatinine 1.73, serum glucose 117, LFTs within normal limits.  FOBT positive.  2 view chest x-ray negative for focal  consolidation, edema, effusion.  Nodular opacities in both upper lung zones on frontal radiograph noted and likely artifact from costochondral calcification.  Patient was given IV Protonix  80 mg in order to receive 2 unit PRBC transfusion.  EDP notified on-call GI via secure chat for routine consult.  The hospitalist service was consulted to admit.  Review of Systems: All systems reviewed and are negative except as documented in history of present illness above.   Past Medical History:  Diagnosis Date   Aortic stenosis    Mild AS 02/2016 echo   Cancer [facility])    skin   Chronic kidney disease, stage 3b (HCC) 11/03/2023   COPD (chronic obstructive pulmonary disease) (HCC)    Dyspnea    GERD (gastroesophageal reflux disease)    Hematuria    Hernia, hiatal    HTN (hypertension)    Hyperlipidemia    Hypothyroid    Migraine    OA (osteoarthritis)    OP (osteoporosis)    Other and unspecified angina pectoris    02/2016 - had non-ischemic stress test   Paroxysmal atrial fibrillation (HCC) 11/03/2023   Pneumonia    Spondylolisthesis of lumbar region     Past Surgical History:  Procedure Laterality Date   APPENDECTOMY     CARDIOVERSION N/A 07/27/2023   Procedure: CARDIOVERSION;  Surgeon: Mona Vinie BROCKS, MD;  Location: [facility]VASIVE [facility];  Service: Cardiovascular;  Laterality: N/A;   CATARACT EXTRACTION, BILATERAL     CERVICAL DISC ARTHROPLASTY     x 2   COLONOSCOPY  ENTEROSCOPY N/A 03/06/2021   Procedure: ENTEROSCOPY;  Surgeon: Rollin Dover, MD;  Location: [facility] ENDOSCOPY;  Service: Endoscopy;  Laterality: N/A;   ESOPHAGOGASTRODUODENOSCOPY  07/19/00   HAND SURGERY Right 2014   HOT HEMOSTASIS N/A 03/06/2021   Procedure: HOT HEMOSTASIS (ARGON PLASMA COAGULATION/BICAP);  Surgeon: Rollin Dover, MD;  Location: [facility];  Service: Endoscopy;  Laterality: N/A;   LAMINECTOMY  05/23/2017   L4-5, L5-S1    Social History: Social History   Tobacco Use   Smoking status: Every Day     Current packs/day: 1.00    Average packs/day: 1 pack/day for 45.0 years (45.0 ttl pk-yrs)    Types: Cigarettes   Smokeless tobacco: Never   Tobacco comments:    0.5ppd as of 10/24/2023  Vaping Use   Vaping status: Never Used  Substance Use Topics   Alcohol use: No   Drug use: No   Allergies  Allergen Reactions   Bactrim [Sulfamethoxazole-Trimethoprim] Shortness Of Breath   Nicotine  Anaphylaxis, Itching, Rash and Other (See Comments)    Rash and itch with patch locally; Chewing the gum closed her throat   Atorvastatin  Other (See Comments)    Muscle aches and leg edema   Codeine Itching and Nausea And Vomiting   Chantix  [Varenicline  Tartrate] Hives, Itching, Nausea And Vomiting, Rash and Other (See Comments)    Mental status changes   Wellbutrin  [Bupropion ] Rash and Other (See Comments)    Slight rash around ankles   Zolpidem Tartrate Other (See Comments)    Bad dreams    Family History  Problem Relation Age of Onset   Heart disease Mother    Heart disease Father    Cancer Sister        breast   Cancer Sister        lung   Heart disease Brother      Prior to Admission medications   Medication Sig Start Date End Date Taking? Authorizing Provider  acetaminophen  (TYLENOL ) 500 MG tablet Take 500-1,000 mg by mouth as needed for mild pain (pain score 1-3) or headache.    [provider]  albuterol  (ACCUNEB ) 1.25 MG/3ML nebulizer solution Take 1 ampule by nebulization 2 (two) times daily as needed for wheezing or shortness of breath.    [provider]  AZO-CRANBERRY PO Take 2 tablets by mouth daily as needed (for urinary symptoms).    [provider]  calcium  carbonate (TUMS - DOSED IN MG ELEMENTAL CALCIUM ) 500 MG chewable tablet Chew 1 tablet by mouth daily.    [provider]  Cholecalciferol (VITAMIN D3) 2000 units TABS Take 2,000 Units by mouth daily.    [provider]  diazepam  (VALIUM ) 5 MG tablet Take 5 mg by mouth as needed  for muscle spasms. 07/29/20   [provider]  escitalopram  (LEXAPRO ) 20 MG tablet Take 20 mg by mouth at bedtime. 08/15/17   [provider]  famotidine  (PEPCID ) 40 MG tablet Take 40 mg by mouth 2 (two) times daily. 03/31/21   [provider]  Fluticasone -Umeclidin-Vilant 100-62.5-25 MCG/INH AEPB Inhale 1 puff into the lungs daily.    [provider]  furosemide  (LASIX ) 20 MG tablet Take 20-40 mg by mouth daily as needed for edema.    [provider]  gabapentin  (NEURONTIN ) 300 MG capsule Take 300 mg by mouth 3 (three) times daily.    [provider]  levothyroxine  (SYNTHROID ) 25 MCG tablet Take 25 mcg by mouth daily before breakfast.    [provider]  potassium chloride  SA (KLOR-CON  M) 20 MEQ tablet Take 1 tablet (20 mEq total) by mouth daily. Patient not taking: Reported on 10/24/2023 07/22/23   Terra Fairy PARAS, PA-C  pravastatin  (PRAVACHOL ) 80 MG tablet Take 80 mg by mouth every evening.    [provider]  PROAIR  HFA 108 (90 Base) MCG/ACT inhaler Inhale 2 puffs into the lungs every 6 (six) hours as needed for wheezing or shortness of breath. 08/25/17   [provider]  Rivaroxaban  (XARELTO ) 15 MG TABS tablet Take 1 tablet (15 mg total) by mouth daily with supper. 10/24/23   Ladona Heinz, MD  verapamil  (CALAN -SR) 120 MG CR tablet Take 1 tablet (120 mg total) by mouth daily. 08/15/23 08/14/24  Fenton, Clint R, PA  zoledronic  acid (RECLAST ) 5 MG/100ML SOLN injection Inject 5 mg into the vein once. Takes once a year    [provider]    Physical Exam: Vitals:   11/03/23 2225 11/03/23 2330 11/03/23 2349 11/04/23 0000  BP:  (!) 120/33  (!) 159/47  Pulse:  76  81  Resp:  (!) 23  (!) 24  Temp: 98.6 F (37 C)  98.9 F (37.2 C)   TempSrc: Oral  Oral   SpO2:  99%  99%  Weight:      Height:       Constitutional: Resting in bed, NAD, calm, comfortable Eyes: EOMI, lids and conjunctivae normal ENMT: Mucous  membranes are moist. Posterior pharynx clear of any exudate or lesions.Normal dentition.  Neck: normal, supple, no masses. Respiratory: Faint expiratory wheezing. Normal respiratory effort. No accessory muscle use.  Cardiovascular: Regular rate and rhythm, no murmurs / rubs / gallops. No extremity edema. 2+ pedal pulses. Abdomen: no tenderness, no masses palpated. Musculoskeletal: no clubbing / cyanosis. No joint deformity upper and lower extremities. Good ROM, no contractures. Normal muscle tone.  Skin: no rashes, lesions, ulcers. No induration Neurologic: Sensation intact. Strength 5/5 in all 4.  Psychiatric: Normal judgment and insight. Alert and oriented x 3. Normal mood.   EKG: Personally reviewed. Sinus rhythm, rate 87, PACs.  Previous EKG showed sinus arrhythmia rate 73.  Assessment/Plan Principal Problem:   Symptomatic anemia Active Problems:   COPD with +BD    Hyperlipidemia   HTN (hypertension)   Hypothyroid   Paroxysmal atrial fibrillation (HCC)   Chronic kidney disease, stage 3b (HCC)   HELANE BRICENO is a 83 y.o. 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  Assessment and Plan: Symptomatic acute on chronic anemia History of GI bleed from small bowel AVMs: Hemoglobin 6.0 on admission compared to baseline 9-10.  She has not seen any obvious bleeding however FOBT is positive.  She is on anticoagulation with Xarelto  and potentially may have slow GI blood loss. - Holding Xarelto  for now - Transfuse 2 unit PRBCs - Repeat CBC posttransfusion - IV Protonix  40 mg twice daily  Paroxysmal atrial fibrillation: Remains in sinus rhythm on admission with controlled rate.  Holding Xarelto .  Continue verapamil .  CKD stage IIIb: Renal function appears to be borderline stage IIIb-IV and relatively stable.  COPD: Continue  Trelegy Ellipta and albuterol  as needed.  Hypertension: Continue verapamil .  Hyperlipidemia: Continue pravastatin .  Hypothyroidism: Continue Synthroid .  Depression/anxiety: Continue Lexapro .   DVT prophylaxis: SCDs Start: 11/03/23 2331 Code Status: Full code, confirmed with patient on admission Family Communication: Sister at  bedside Disposition Plan: From home, dispo pending clinical progress Consults called: EDP notified on-call GI via secure chat Severity of Illness: The appropriate patient status for this patient is OBSERVATION. Observation status is judged to be reasonable and necessary in order to provide the required intensity of service to ensure the patient's safety. The patient's presenting symptoms, physical exam findings, and initial radiographic and laboratory data in the context of their medical condition is felt to place them at decreased risk for further clinical deterioration. Furthermore, it is anticipated that the patient will be medically stable for discharge from the hospital within 2 midnights of admission.   Jorie Blanch MD [facility]  If 7PM-7AM, please contact night-coverage [email or web address]  11/04/2023, 12:11 AM

## 2023-11-03 NOTE — ED Provider Notes (Signed)
 [place] EMERGENCY DEPARTMENT AT [facility] Provider Note  CSN: [id number] Arrival date & time: 11/03/23 1721  Chief Complaint(s) Shortness of Breath and Abnormal Lab (HGB 6.2)  HPI Joann Barnes is a 83 y.o. female history of COPD, GERD, hypertension, hyperlipidemia, atrial fibrillation on Eliquis  presenting to the emergency department with weakness.  Patient reports increasing weakness and shortness of breath over the past few days, exertional dyspnea, lightheadedness.  No syncope.  Has not noted any melena, hematochezia.  No nausea or vomiting.  Reports needed blood transfusion once before for bleeding.  Her nephrologist check labs which showed a hemoglobin of 6.2, referred to ER for transfusion and workup.   Past Medical History Past Medical History:  Diagnosis Date   Aortic stenosis    Mild AS 02/2016 echo   Cancer [facility])    skin   Chronic kidney disease, stage 3b (HCC) 11/03/2023   COPD (chronic obstructive pulmonary disease) (HCC)    Dyspnea    GERD (gastroesophageal reflux disease)    Hematuria    Hernia, hiatal    HTN (hypertension)    Hyperlipidemia    Hypothyroid    Migraine    OA (osteoarthritis)    OP (osteoporosis)    Other and unspecified angina pectoris    02/2016 - had non-ischemic stress test   Paroxysmal atrial fibrillation (HCC) 11/03/2023   Pneumonia    Spondylolisthesis of lumbar region    Patient Active Problem List   Diagnosis Date Noted   Paroxysmal atrial fibrillation (HCC) 11/03/2023   Chronic kidney disease, stage 3b (HCC) 11/03/2023   Hypercoagulable state due to persistent atrial fibrillation (HCC) 07/21/2023   Persistent atrial fibrillation (HCC) 07/21/2023   Symptomatic anemia 02/05/2021   Acromial fracture 06/05/2020   Encephalopathy acute 02/17/2020   Healthcare maintenance 04/09/2019   Abnormal findings on diagnostic imaging of lung 04/08/2019   History of lumbar spinal fusion 03/14/2019   Trochanteric bursitis, right hip  03/14/2019   Spondylolisthesis of lumbar region 05/23/2017   Acute kidney injury superimposed on chronic kidney disease (HCC) 02/04/2016   Chest pain with moderate risk of acute coronary syndrome 02/04/2016   Tobacco use 02/04/2016   Hypokalemia 02/04/2016   Murmur, cardiac-suspect this is AS 02/04/2016   Hyperlipidemia    GERD (gastroesophageal reflux disease)    HTN (hypertension)    Hypothyroid    Chest pain    COPD with +BD  11/16/2010   Dyspnea 10/22/2010   Smoking 10/22/2010   Home Medication(s) Prior to Admission medications   Medication Sig Start Date End Date Taking? Authorizing Provider  acetaminophen  (TYLENOL ) 500 MG tablet Take 500-1,000 mg by mouth as needed for mild pain (pain score 1-3) or headache.    [provider]  albuterol  (ACCUNEB ) 1.25 MG/3ML nebulizer solution Take 1 ampule by nebulization 2 (two) times daily as needed for wheezing or shortness of breath.    [provider]  AZO-CRANBERRY PO Take 2 tablets by mouth daily as needed (for urinary symptoms).    [provider]  calcium  carbonate (TUMS - DOSED IN MG ELEMENTAL CALCIUM ) 500 MG chewable tablet Chew 1 tablet by mouth daily.    [provider]  Cholecalciferol (VITAMIN D3) 2000 units TABS Take 2,000 Units by mouth daily.    [provider]  diazepam  (VALIUM ) 5 MG tablet Take 5 mg by mouth as needed for muscle spasms. 07/29/20   [provider]  escitalopram  (LEXAPRO ) 20 MG tablet Take 20 mg by mouth at bedtime.  08/15/17   [provider]  famotidine  (PEPCID ) 40 MG tablet Take 40 mg by mouth 2 (two) times daily. 03/31/21   [provider]  Fluticasone -Umeclidin-Vilant 100-62.5-25 MCG/INH AEPB Inhale 1 puff into the lungs daily.    [provider]  furosemide  (LASIX ) 20 MG tablet Take 20-40 mg by mouth daily as needed for edema.    [provider]  gabapentin  (NEURONTIN ) 300 MG capsule Take 300 mg by mouth 3 (three) times  daily.    [provider]  levothyroxine  (SYNTHROID ) 25 MCG tablet Take 25 mcg by mouth daily before breakfast.    [provider]  potassium chloride  SA (KLOR-CON  M) 20 MEQ tablet Take 1 tablet (20 mEq total) by mouth daily. Patient not taking: Reported on 10/24/2023 07/22/23   Terra Fairy PARAS, PA-C  pravastatin  (PRAVACHOL ) 80 MG tablet Take 80 mg by mouth every evening.    [provider]  PROAIR  HFA 108 (90 Base) MCG/ACT inhaler Inhale 2 puffs into the lungs every 6 (six) hours as needed for wheezing or shortness of breath. 08/25/17   [provider]  Rivaroxaban  (XARELTO ) 15 MG TABS tablet Take 1 tablet (15 mg total) by mouth daily with supper. 10/24/23   Ladona Heinz, MD  verapamil  (CALAN -SR) 120 MG CR tablet Take 1 tablet (120 mg total) by mouth daily. 08/15/23 08/14/24  Fenton, Clint R, PA  zoledronic  acid (RECLAST ) 5 MG/100ML SOLN injection Inject 5 mg into the vein once. Takes once a year    [provider]                                                                                                                                    Past Surgical History Past Surgical History:  Procedure Laterality Date   APPENDECTOMY     CARDIOVERSION N/A 07/27/2023   Procedure: CARDIOVERSION;  Surgeon: Mona Vinie BROCKS, MD;  Location: [facility] INVASIVE CV LAB;  Service: Cardiovascular;  Laterality: N/A;   CATARACT EXTRACTION, BILATERAL     CERVICAL DISC ARTHROPLASTY     x 2   COLONOSCOPY     ENTEROSCOPY N/A 03/06/2021   Procedure: ENTEROSCOPY;  Surgeon: Rollin Dover, MD;  Location: [facility] ENDOSCOPY;  Service: Endoscopy;  Laterality: N/A;   ESOPHAGOGASTRODUODENOSCOPY  07/19/00   HAND SURGERY Right 2014   HOT HEMOSTASIS N/A 03/06/2021   Procedure: HOT HEMOSTASIS (ARGON PLASMA COAGULATION/BICAP);  Surgeon: Rollin Dover, MD;  Location: [facility];  Service: Endoscopy;  Laterality: N/A;   LAMINECTOMY  05/23/2017   L4-5, L5-S1   Family History Family History  Problem  Relation Age of Onset   Heart disease Mother    Heart disease Father    Cancer Sister        breast   Cancer Sister        lung   Heart disease Brother     Social History Social History   Tobacco Use  Smoking status: Every Day    Current packs/day: 1.00    Average packs/day: 1 pack/day for 45.0 years (45.0 ttl pk-yrs)    Types: Cigarettes   Smokeless tobacco: Never   Tobacco comments:    0.5ppd as of 10/24/2023  Vaping Use   Vaping status: Never Used  Substance Use Topics   Alcohol use: No   Drug use: No   Allergies Bactrim [sulfamethoxazole-trimethoprim], Nicotine , Atorvastatin , Codeine, Chantix  [varenicline  tartrate], Wellbutrin  [bupropion ], and Zolpidem tartrate  Review of Systems Review of Systems  All other systems reviewed and are negative.   Physical Exam Vital Signs  I have reviewed the triage vital signs BP (!) 136/108   Pulse 80   Temp 98.6 F (37 C) (Oral)   Resp (!) 22   Ht 5' 3 (1.6 m)   Wt 56.7 kg   SpO2 100%   BMI 22.14 kg/m  Physical Exam Vitals and nursing note reviewed.  Constitutional:      General: She is not in acute distress.    Appearance: She is well-developed.  HENT:     Head: Normocephalic and atraumatic.     Mouth/Throat:     Mouth: Mucous membranes are moist.  Eyes:     Pupils: Pupils are equal, round, and reactive to light.  Cardiovascular:     Rate and Rhythm: Normal rate and regular rhythm.     Heart sounds: No murmur heard. Pulmonary:     Effort: Pulmonary effort is normal. No respiratory distress.     Breath sounds: Normal breath sounds.  Abdominal:     General: Abdomen is flat.     Palpations: Abdomen is soft.     Tenderness: There is no abdominal tenderness.  Genitourinary:    Comments: Chaperoned by nurse technician, brown stool present to be sent to the lab. Musculoskeletal:        General: No tenderness.     Right lower leg: No edema.     Left lower leg: No edema.  Skin:    General: Skin is warm and  dry.     Coloration: Skin is pale.  Neurological:     General: No focal deficit present.     Mental Status: She is alert. Mental status is at baseline.  Psychiatric:        Mood and Affect: Mood normal.        Behavior: Behavior normal.     ED Results and Treatments Labs (all labs ordered are listed, but only abnormal results are displayed) Labs Reviewed  CBC WITH DIFFERENTIAL/PLATELET - Abnormal; Notable for the following components:      Result Value   RBC 2.50 (*)    Hemoglobin 6.0 (*)    HCT 20.8 (*)    MCH 24.0 (*)    MCHC 28.8 (*)    RDW 19.6 (*)    All other components within normal limits  COMPREHENSIVE METABOLIC PANEL WITH GFR - Abnormal; Notable for the following components:   CO2 21 (*)    Glucose, Bld 117 (*)    Creatinine, Ser 1.73 (*)    Total Protein 5.8 (*)    GFR, Estimated 29 (*)    All other components within normal limits  BRAIN NATRIURETIC PEPTIDE - Abnormal; Notable for the following components:   B Natriuretic Peptide 302.4 (*)    All other components within normal limits  POC OCCULT BLOOD, ED - Abnormal; Notable for the following components:   Fecal Occult Bld POSITIVE (*)    All  other components within normal limits  TYPE AND SCREEN  PREPARE RBC (CROSSMATCH)                                                                                                                          Radiology DG Chest 2 View Result Date: 11/03/2023 CLINICAL DATA:  Shortness of breath EXAM: CHEST - 2 VIEW COMPARISON:  January 14, 2022, Sep 03, 2021, February 05, 2021 FINDINGS: Nodular opacities in both upper lung zones on the frontal radiograph, without correlate on the lateral radiograph, likely artifact from costochondral calcification. No focal airspace consolidation, pleural effusion, or pneumothorax. Mild cardiomegaly. Tortuous aorta with aortic atherosclerosis. No acute fracture or destructive lesions. Multilevel degenerative disc disease of the spine. Osteopenia.  IMPRESSION: 1. No acute cardiopulmonary abnormality. 2. Nodular opacities in both upper lung zones on the frontal radiograph, without correlate on the lateral radiograph, likely artifact from costochondral calcification. Nonemergent chest CT recommended for confirmation. Electronically Signed   By: Rogelia Myers M.D.   On: 11/03/2023 18:22    Pertinent labs & imaging results that were available during my care of the patient were reviewed by me and considered in my medical decision making (see MDM for details).  Medications Ordered in ED Medications  0.9 %  sodium chloride  infusion (Manually program via Guardrails IV Fluids) (has no administration in time range)  pantoprazole  (PROTONIX ) injection 80 mg (has no administration in time range)                                                                                                                                     Procedures .Critical Care  Performed by: Francesca Elsie CROME, MD Authorized by: Francesca Elsie CROME, MD   Critical care provider statement:    Critical care time (minutes):  30   Critical care was necessary to treat or prevent imminent or life-threatening deterioration of the following conditions:  Circulatory failure   Critical care was time spent personally by me on the following activities:  Development of treatment plan with patient or surrogate, discussions with consultants, evaluation of patient's response to treatment, examination of patient, ordering and review of laboratory studies, ordering and review of radiographic studies, ordering and performing treatments and interventions, pulse oximetry, re-evaluation of patient's condition and review of old charts   Care discussed with: admitting provider     (including critical care time)  Medical  Decision Making / ED Course   MDM:  83 year old presenting to the emergency department with weakness.  Patient appears pale, rectal exam with brown stool.  Patient denies  any obvious signs of blood loss like hematochezia, hematemesis, nosebleeds.  She overall does appear quite well with normal vital signs.  She has brown stool without melena.  Hemoglobin here is 6 and will transfuse given patient is symptomatic from her anemia.  Will check occult blood testing.  If positive patient will definitely need admission.  Will order 2 units of blood.  Clinical Course as of 11/03/23 2257  Thu Nov 03, 2023  2255 FOBT positive. Patient agreeable to admission. Will hold eliquis . PPI given. Sent message to Gi. Will be admitted by Dr. Tobie.  [WS]    Clinical Course User Index [WS] Francesca, Elsie CROME, MD     Additional history obtained: -Additional history obtained from family -External records from outside source obtained and reviewed including: Chart review including previous notes, labs, imaging, consultation notes including prior notes    Lab Tests: -I ordered, reviewed, and interpreted labs.   The pertinent results include:   Labs Reviewed  CBC WITH DIFFERENTIAL/PLATELET - Abnormal; Notable for the following components:      Result Value   RBC 2.50 (*)    Hemoglobin 6.0 (*)    HCT 20.8 (*)    MCH 24.0 (*)    MCHC 28.8 (*)    RDW 19.6 (*)    All other components within normal limits  COMPREHENSIVE METABOLIC PANEL WITH GFR - Abnormal; Notable for the following components:   CO2 21 (*)    Glucose, Bld 117 (*)    Creatinine, Ser 1.73 (*)    Total Protein 5.8 (*)    GFR, Estimated 29 (*)    All other components within normal limits  BRAIN NATRIURETIC PEPTIDE - Abnormal; Notable for the following components:   B Natriuretic Peptide 302.4 (*)    All other components within normal limits  POC OCCULT BLOOD, ED - Abnormal; Notable for the following components:   Fecal Occult Bld POSITIVE (*)    All other components within normal limits  TYPE AND SCREEN  PREPARE RBC (CROSSMATCH)    Notable for anemia   Imaging Studies ordered: I ordered imaging studies  including CXR On my interpretation imaging demonstrates nonspecific nodule vs artifact recommend outpatient follow up, discussed with pt  I independently visualized and interpreted imaging. I agree with the radiologist interpretation   Medicines ordered and prescription drug management: Meds ordered this encounter  Medications   0.9 %  sodium chloride  infusion (Manually program via Guardrails IV Fluids)   pantoprazole  (PROTONIX ) injection 80 mg    -I have reviewed the patients home medicines and have made adjustments as needed   Consultations Obtained: I requested consultation with the hospitalist,  and discussed lab and imaging findings as well as pertinent plan - they recommend: admission   Cardiac Monitoring: The patient was maintained on a cardiac monitor.  I personally viewed and interpreted the cardiac monitored which showed an underlying rhythm of: NSR  Social Determinants of Health:  Diagnosis or treatment significantly limited by social determinants of health: current smoker   Reevaluation: After the interventions noted above, I reevaluated the patient and found that their symptoms have improved  Co morbidities that complicate the patient evaluation  Past Medical History:  Diagnosis Date   Aortic stenosis    Mild AS 02/2016 echo   Cancer (HCC)    skin  Chronic kidney disease, stage 3b (HCC) 11/03/2023   COPD (chronic obstructive pulmonary disease) (HCC)    Dyspnea    GERD (gastroesophageal reflux disease)    Hematuria    Hernia, hiatal    HTN (hypertension)    Hyperlipidemia    Hypothyroid    Migraine    OA (osteoarthritis)    OP (osteoporosis)    Other and unspecified angina pectoris    02/2016 - had non-ischemic stress test   Paroxysmal atrial fibrillation (HCC) 11/03/2023   Pneumonia    Spondylolisthesis of lumbar region       Dispostion: Disposition decision including need for hospitalization was considered, and patient admitted to the  hospital.    Final Clinical Impression(s) / ED Diagnoses Final diagnoses:  Symptomatic anemia     This chart was dictated using voice recognition software.  Despite best efforts to proofread,  errors can occur which can change the documentation meaning.    Francesca Elsie CROME, MD 11/03/23 2257

## 2023-11-04 DIAGNOSIS — R195 Other fecal abnormalities: Secondary | ICD-10-CM | POA: Diagnosis not present

## 2023-11-04 DIAGNOSIS — J410 Simple chronic bronchitis: Secondary | ICD-10-CM | POA: Diagnosis not present

## 2023-11-04 DIAGNOSIS — I4891 Unspecified atrial fibrillation: Secondary | ICD-10-CM | POA: Diagnosis not present

## 2023-11-04 DIAGNOSIS — I10 Essential (primary) hypertension: Secondary | ICD-10-CM

## 2023-11-04 DIAGNOSIS — D649 Anemia, unspecified: Secondary | ICD-10-CM | POA: Diagnosis not present

## 2023-11-04 DIAGNOSIS — N1832 Chronic kidney disease, stage 3b: Secondary | ICD-10-CM

## 2023-11-04 DIAGNOSIS — Z7901 Long term (current) use of anticoagulants: Secondary | ICD-10-CM | POA: Diagnosis not present

## 2023-11-04 LAB — CBC
HCT: 28.7 % — ABNORMAL LOW (ref 36.0–46.0)
Hemoglobin: 9.4 g/dL — ABNORMAL LOW (ref 12.0–15.0)
MCH: 26.9 pg (ref 26.0–34.0)
MCHC: 32.8 g/dL (ref 30.0–36.0)
MCV: 82 fL (ref 80.0–100.0)
Platelets: 280 K/uL (ref 150–400)
RBC: 3.5 MIL/uL — ABNORMAL LOW (ref 3.87–5.11)
RDW: 17.4 % — ABNORMAL HIGH (ref 11.5–15.5)
WBC: 8 K/uL (ref 4.0–10.5)
nRBC: 0 % (ref 0.0–0.2)

## 2023-11-04 LAB — BASIC METABOLIC PANEL WITH GFR
Anion gap: 8 (ref 5–15)
BUN: 20 mg/dL (ref 8–23)
CO2: 22 mmol/L (ref 22–32)
Calcium: 8.5 mg/dL — ABNORMAL LOW (ref 8.9–10.3)
Chloride: 109 mmol/L (ref 98–111)
Creatinine, Ser: 1.48 mg/dL — ABNORMAL HIGH (ref 0.44–1.00)
GFR, Estimated: 35 mL/min — ABNORMAL LOW (ref >60)
Glucose, Bld: 87 mg/dL (ref 70–99)
Potassium: 3.8 mmol/L (ref 3.5–5.1)
Sodium: 139 mmol/L (ref 135–145)

## 2023-11-04 MED ORDER — ESCITALOPRAM OXALATE 20 MG PO TABS
20.0000 mg | ORAL_TABLET | Freq: Every day | ORAL | Status: DC
  Administered 2023-11-04: 20 mg via ORAL
  Filled 2023-11-04: qty 1
  Filled 2023-11-04: qty 2

## 2023-11-04 MED ORDER — ALBUTEROL SULFATE (2.5 MG/3ML) 0.083% IN NEBU: 2.5000 mg | INHALATION_SOLUTION | Freq: Four times a day (QID) | RESPIRATORY_TRACT | Status: DC | PRN

## 2023-11-04 MED ORDER — BUDESON-GLYCOPYRROL-FORMOTEROL 160-9-4.8 MCG/ACT IN AERO
2.0000 | INHALATION_SPRAY | Freq: Two times a day (BID) | RESPIRATORY_TRACT | Status: DC
  Administered 2023-11-04 – 2023-11-05 (×4): 2 via RESPIRATORY_TRACT
  Filled 2023-11-04: qty 5.9

## 2023-11-04 MED ORDER — VERAPAMIL HCL ER 120 MG PO TBCR
120.0000 mg | EXTENDED_RELEASE_TABLET | Freq: Every day | ORAL | Status: DC
  Administered 2023-11-04 – 2023-11-05 (×2): 120 mg via ORAL
  Filled 2023-11-04 (×2): qty 1

## 2023-11-04 MED ORDER — LEVOTHYROXINE SODIUM 25 MCG PO TABS
25.0000 ug | ORAL_TABLET | Freq: Every day | ORAL | Status: DC
  Administered 2023-11-04 – 2023-11-05 (×2): 25 ug via ORAL
  Filled 2023-11-04 (×2): qty 1

## 2023-11-04 MED ORDER — PRAVASTATIN SODIUM 40 MG PO TABS
80.0000 mg | ORAL_TABLET | Freq: Every evening | ORAL | Status: DC
  Administered 2023-11-04: 80 mg via ORAL
  Filled 2023-11-04 (×2): qty 2

## 2023-11-04 NOTE — Progress Notes (Signed)
 [facility]                                                                              Joann Barnes, is a 83 y.o. female, DOB - [birth year], FMW:[id number] Admit date - 11/03/2023    Outpatient Primary MD for the patient is Joann Lombard, MD  LOS - 0  days  Chief Complaint  Patient presents with   Shortness of Breath   Abnormal Lab    HGB 6.2       Brief summary   Patient is a 83 year old female with paroxysmal A-fib on Xarelto , history of GI bleed due to small bowel AVMs, chronic anemia, COPD, CKD stage IIIb, HTN, HLD, hypothyroidism who presented to the ED for evaluation of symptomatic anemia.  Patient was newly diagnosed with A-fib in February 2025.  She was initially started on Eliquis , underwent cardioversion on 3/26 converting to NSR.   Per patient, over the last 2 weeks, had significant lightheadedness on standing and with exertion, fatigue and shortness of breath. There was concern that this was a side effect of Eliquis  and she was switched to Xarelto  about 1.5 weeks ago. Per patient, she had labs obtained by her nephrologist for her upcoming appointment and hemoglobin was down to 6.2.  Patient states that she has not seen any obvious bleeding.  She does have a history of prior GI bleed from small bowel AVMs treated during small bowel endoscopy in November 2022.   Assessment & Plan    Symptomatic acute on chronic anemia History of GI bleed from small bowel AVMs: - Hemoglobin 6.0 on admission compared to baseline 9-10.  FOBT positive  - On anticoagulation with Xarelto , placed on hold  - Transfused 2 units packed RBCs, hemoglobin 9.4  - GI consulted, will follow recommendations regarding anticoagulation and endoscopy - Continue IV PPI    Paroxysmal atrial fibrillation: Remains in sinus rhythm on admission with controlled rate.  - Holding Xarelto .  Continue verapamil .   CKD stage IIIb: - Renal function appears to be borderline stage IIIb-IV and  relatively stable.   COPD: -Currently no acute wheezing - continue Trelegy Ellipta and albuterol  as needed.   Hypertension: Continue verapamil .   Hyperlipidemia: Continue pravastatin .   Hypothyroidism: Continue Synthroid .   Depression/anxiety: Continue Lexapro .   Estimated body mass index is 22.3 kg/m as calculated from the following:   Height as of this encounter: 5' 3 (1.6 m).   Weight as of this encounter: 57.1 kg.  Code Status: Full code DVT Prophylaxis:  SCDs Start: 11/03/23 2331   Level of Care: Level of care: Telemetry Medical Family Communication: Updated patient's sister at the bedside Disposition Plan:      Remains inpatient appropriate:   Pending GI evaluation   Procedures:    Consultants:   GI  Antimicrobials:   Anti-infectives (From admission, onward)    None          Medications  sodium chloride    Intravenous Once   budesonide -glycopyrrolate -formoterol   2 puff Inhalation BID   escitalopram   20 mg Oral QHS   levothyroxine   25 mcg Oral Q0600   pantoprazole  (PROTONIX ) IV  40  mg Intravenous Q12H   pravastatin   80 mg Oral QPM   sodium chloride  flush  3 mL Intravenous Q12H   verapamil   120 mg Oral Daily      Subjective:   Joann Barnes was seen and examined today.  No acute complaints, no active nausea vomiting, abdominal pain or bleeding.  Sister at the bedside Objective:   Vitals:   11/04/23 0312 11/04/23 0500 11/04/23 0513 11/04/23 0750  BP: (!) 148/38  (!) 143/88 (!) 156/49  Pulse: 76  75 68  Resp: 20   18  Temp:   98.4 F (36.9 C) 97.9 F (36.6 C)  TempSrc: Oral  Oral   SpO2: 96%  96% 93%  Weight:  57.1 kg    Height:        Intake/Output Summary (Last 24 hours) at 11/04/2023 1235 Last data filed at 11/04/2023 0601 Gross per 24 hour  Intake 668.75 ml  Output --  Net 668.75 ml     Wt Readings from Last 3 Encounters:  11/04/23 57.1 kg  10/24/23 56.7 kg  08/15/23 56.2 kg     Exam General: Alert and oriented x 3,  NAD Cardiovascular: S1 S2 auscultated,  RRR Respiratory: CTAB Gastrointestinal: Soft, nontender, nondistended, + bowel sounds Ext: no pedal edema bilaterally Neuro: no new FND's  Psych: Normal affect     Data Reviewed:  I have personally reviewed following labs    CBC Lab Results  Component Value Date   WBC 8.0 11/04/2023   RBC 3.50 (L) 11/04/2023   HGB 9.4 (L) 11/04/2023   HCT 28.7 (L) 11/04/2023   MCV 82.0 11/04/2023   MCH 26.9 11/04/2023   PLT 280 11/04/2023   MCHC 32.8 11/04/2023   RDW 17.4 (H) 11/04/2023   LYMPHSABS 1.4 11/03/2023   MONOABS 0.8 11/03/2023   EOSABS 0.1 11/03/2023   BASOSABS 0.1 11/03/2023     Last metabolic panel Lab Results  Component Value Date   NA 139 11/04/2023   K 3.8 11/04/2023   CL 109 11/04/2023   CO2 22 11/04/2023   BUN 20 11/04/2023   CREATININE 1.48 (H) 11/04/2023   GLUCOSE 87 11/04/2023   GFRNONAA 35 (L) 11/04/2023   GFRAA 49 (L) 12/15/2017   CALCIUM  8.5 (L) 11/04/2023   PROT 5.8 (L) 11/03/2023   ALBUMIN  3.5 11/03/2023   BILITOT 0.4 11/03/2023   ALKPHOS 38 11/03/2023   AST 15 11/03/2023   ALT 10 11/03/2023   ANIONGAP 8 11/04/2023    CBG (last 3)  No results for input(s): GLUCAP in the last 72 hours.    Coagulation Profile: No results for input(s): INR, PROTIME in the last 168 hours.   Radiology Studies: I have personally reviewed the imaging studies  DG Chest 2 View Result Date: 11/03/2023 CLINICAL DATA:  Shortness of breath EXAM: CHEST - 2 VIEW COMPARISON:  January 14, 2022, Sep 03, 2021, February 05, 2021 FINDINGS: Nodular opacities in both upper lung zones on the frontal radiograph, without correlate on the lateral radiograph, likely artifact from costochondral calcification. No focal airspace consolidation, pleural effusion, or pneumothorax. Mild cardiomegaly. Tortuous aorta with aortic atherosclerosis. No acute fracture or destructive lesions. Multilevel degenerative disc disease of the spine. Osteopenia.  IMPRESSION: 1. No acute cardiopulmonary abnormality. 2. Nodular opacities in both upper lung zones on the frontal radiograph, without correlate on the lateral radiograph, likely artifact from costochondral calcification. Nonemergent chest CT recommended for confirmation. Electronically Signed   By: Rogelia Myers M.D.   On: 11/03/2023 18:22  Nydia Distance M.D. [facility] 11/04/2023, 12:35 PM  Available via Epic secure chat 7am-7pm After 7 pm, please refer to night coverage provider listed on amion.

## 2023-11-04 NOTE — Progress Notes (Signed)
 Dr. Alfornia informed that cardiac monitoring will expire in 3 hours

## 2023-11-04 NOTE — Plan of Care (Signed)

## 2023-11-04 NOTE — ED Notes (Signed)
 Lab and minilab called about discrepancy in type and screen and hemocult.Type and screen was redone and resent.

## 2023-11-04 NOTE — ED Notes (Addendum)
 Mixup in mini lab and main lab caused blood type band screen to be delayed. As a courtesy,this RN started the blood and will send PT up after the 15 min start.

## 2023-11-04 NOTE — Consult Note (Signed)
 Consultation  Referring Provider: [facility]/ Barnes Primary Care Physician:  Joann Lombard, MD Primary Gastroenterologist:  Dr.Mann  Reason for Consultation: Marked anemia, heme positive stool in setting of Xarelto   HPI: Joann Barnes is a 83 y.o. female with history of COPD, chronic kidney disease stage III, hypertension, hyperlipidemia, and diagnosis of atrial fibrillation made in February 2025.  She was initially anticoagulated with Eliquis , she underwent cardioversion in March 2025.  She had recently reported dizziness to her cardiologist and was just transition to Xarelto  about a week and a half ago with the thought that Eliquis  may have been causing dizziness.  She had labs yesterday done as an outpatient prior to visit with her nephrologist and was found to have a hemoglobin of 6.  She was advised to be admitted for transfusions and further workup. Her last dose of Xarelto  was yesterday  Labs here show WBC of 8.5/hemoglobin 6/hematocrit 20.8/MCV 83/platelets 361 BNP 302 BUN 22/creatinine 1.73 Stool documented heme positive.  No abdominal imaging this admission.  She has been transfused 2 units of packed RBCs and hemoglobin is up to 9.4 today/hematocrit 28.7.  Reviewing her labs she does have a chronic anemia with hemoglobin 10.1 in March 2025 and hemoglobin 9.08 August 2023.  She has noticed that she has been feeling fatigued and notably more short of breath with ambulating over the past couple of months.  She has not noticed any melena or hematochezia, she has not had any change in her bowel habits, no complaints of abdominal discomfort, appetite has been fine, no nausea or vomiting.  She does have history of previously documented small bowel AVMs and had undergone workup in November 2022 for anemia and heme positive stool.  Enteroscopy at that time with finding of a 4 cm hiatal hernia moderate gastritis and multiple AVMs in the second portion of the duodenum treated with APC and  additional AVMs in the jejunum treated with APC.Joann Barnes   Past Medical History:  Diagnosis Date   Aortic stenosis    Mild AS 02/2016 echo   Cancer [facility])    skin   Chronic kidney disease, stage 3b (HCC) 11/03/2023   COPD (chronic obstructive pulmonary disease) (HCC)    Dyspnea    GERD (gastroesophageal reflux disease)    Hematuria    Hernia, hiatal    HTN (hypertension)    Hyperlipidemia    Hypothyroid    Migraine    OA (osteoarthritis)    OP (osteoporosis)    Other and unspecified angina pectoris    02/2016 - had non-ischemic stress test   Paroxysmal atrial fibrillation (HCC) 11/03/2023   Pneumonia    Spondylolisthesis of lumbar region     Past Surgical History:  Procedure Laterality Date   APPENDECTOMY     CARDIOVERSION N/A 07/27/2023   Procedure: CARDIOVERSION;  Surgeon: Joann Vinie BROCKS, MD;  Location: [facility];  Service: Cardiovascular;  Laterality: N/A;   CATARACT EXTRACTION, BILATERAL     CERVICAL DISC ARTHROPLASTY     x 2   COLONOSCOPY     ENTEROSCOPY N/A 03/06/2021   Procedure: ENTEROSCOPY;  Surgeon: Rollin Dover, MD;  Location: [facility]OS[facility];  Service: Endoscopy;  Laterality: N/A;   ESOPHAGOGASTRODUODENOSCOPY  07/19/00   HAND SURGERY Right 2014   HOT HEMOSTASIS N/A 03/06/2021   Procedure: HOT HEMOSTASIS (ARGON PLASMA COAGULATION/BICAP);  Surgeon: Rollin Dover, MD;  Location: [facility];  Service: Endoscopy;  Laterality: N/A;   LAMINECTOMY  05/23/2017   L4-5, L5-S1    Prior  to Admission medications   Medication Sig Start Date End Date Taking? Authorizing Provider  acetaminophen  (TYLENOL ) 500 MG tablet Take 500-1,000 mg by mouth as needed for mild pain (pain score 1-3) or headache.   Yes [provider]  albuterol  (ACCUNEB ) 1.25 MG/3ML nebulizer solution Take 1 ampule by nebulization 2 (two) times daily as needed for wheezing or shortness of breath.   Yes [provider]  AZO-CRANBERRY PO Take 2 tablets by mouth daily as needed (for  urinary symptoms).   Yes [provider]  calcium  carbonate (TUMS - DOSED IN MG ELEMENTAL CALCIUM ) 500 MG chewable tablet Chew 1 tablet by mouth daily as needed for heartburn.   Yes [provider]  Cholecalciferol (VITAMIN D3) 2000 units TABS Take 2,000 Units by mouth daily.   Yes [provider]  escitalopram  (LEXAPRO ) 20 MG tablet Take 20 mg by mouth at bedtime. 08/15/17  Yes [provider]  famotidine  (PEPCID ) 40 MG tablet Take 40 mg by mouth 2 (two) times daily. 03/31/21  Yes [provider]  Fluticasone -Umeclidin-Vilant 100-62.5-25 MCG/INH AEPB Inhale 1 puff into the lungs daily.   Yes [provider]  furosemide  (LASIX ) 20 MG tablet Take 20-40 mg by mouth daily as needed for edema.   Yes [provider]  levothyroxine  (SYNTHROID ) 25 MCG tablet Take 25 mcg by mouth daily before breakfast.   Yes [provider]  PROAIR  HFA 108 (90 Base) MCG/ACT inhaler Inhale 2 puffs into the lungs every 6 (six) hours as needed for wheezing or shortness of breath. 08/25/17  Yes [provider]  Rivaroxaban  (XARELTO ) 15 MG TABS tablet Take 1 tablet (15 mg total) by mouth daily with supper. 10/24/23  Yes Joann Heinz, MD  verapamil  (CALAN -SR) 120 MG CR tablet Take 1 tablet (120 mg total) by mouth daily. 08/15/23 08/14/24 Yes Barnes, Joann R, PA  zoledronic  acid (RECLAST ) 5 MG/100ML SOLN injection Inject 5 mg into the vein once. Takes once a year   Yes [provider]  gabapentin  (NEURONTIN ) 300 MG capsule Take 300 mg by mouth 3 (three) times daily. Patient not taking: Reported on 11/03/2023    [provider]  potassium chloride  SA (KLOR-CON  M) 20 MEQ tablet Take 1 tablet (20 mEq total) by mouth daily. Patient not taking: Reported on 08/15/2023 07/22/23   Joann Fairy PARAS, PA-C  pravastatin  (PRAVACHOL ) 80 MG tablet Take 80 mg by mouth every evening.    [provider]    Current Facility-Administered Medications   Medication Dose Route Frequency Provider Last Rate Last Admin   0.9 %  sodium chloride  infusion (Manually program via Guardrails IV Fluids)   Intravenous Once Francesca Elsie CROME, MD       acetaminophen  (TYLENOL ) tablet 650 mg  650 mg Oral Q6H PRN Patel, Vishal R, MD   650 mg at 11/04/23 9676   Or   acetaminophen  (TYLENOL ) suppository 650 mg  650 mg Rectal Q6H PRN Patel, Vishal R, MD       albuterol  (PROVENTIL ) (2.5 MG/3ML) 0.083% nebulizer solution 2.5 mg  2.5 mg Inhalation Q6H PRN Patel, Vishal R, MD       budesonide -glycopyrrolate -formoterol  (BREZTRI ) 160-9-4.8 MCG/ACT inhaler 2 puff  2 puff Inhalation BID Tobie Jorie SAUNDERS, MD   2 puff at 11/04/23 9082   escitalopram  (LEXAPRO ) tablet 20 mg  20 mg Oral QHS Patel, Vishal R, MD       levothyroxine  (SYNTHROID ) tablet 25 mcg  25 mcg Oral Q0600 Tobie Jorie SAUNDERS, MD  25 mcg at 11/04/23 0559   ondansetron  (ZOFRAN ) tablet 4 mg  4 mg Oral Q6H PRN Patel, Vishal R, MD       Or   ondansetron  (ZOFRAN ) injection 4 mg  4 mg Intravenous Q6H PRN Patel, Vishal R, MD       pantoprazole  (PROTONIX ) injection 40 mg  40 mg Intravenous Q12H Patel, Vishal R, MD   40 mg at 11/04/23 9081   pravastatin  (PRAVACHOL ) tablet 80 mg  80 mg Oral QPM Patel, Vishal R, MD       senna-docusate (Senokot-S) tablet 1 tablet  1 tablet Oral QHS PRN Tobie Jorie SAUNDERS, MD       sodium chloride  flush (NS) 0.9 % injection 3 mL  3 mL Intravenous Q12H Tobie Jorie R, MD   3 mL at 11/04/23 1124   verapamil  (CALAN -SR) CR tablet 120 mg  120 mg Oral Daily Patel, Vishal R, MD   120 mg at 11/04/23 9081    Allergies as of 11/03/2023 - Review Complete 11/03/2023  Allergen Reaction Noted   Bactrim [sulfamethoxazole-trimethoprim] Shortness Of Breath 10/26/2011   Nicotine  Anaphylaxis, Itching, Rash, and Other (See Comments) 01/08/2011   Atorvastatin  Other (See Comments) 12/15/2017   Codeine Itching and Nausea And Vomiting 10/21/2010   Chantix  [varenicline  tartrate] Hives, Itching, Nausea And  Vomiting, Rash, and Other (See Comments) 10/21/2010   Wellbutrin  [bupropion ] Rash and Other (See Comments) 04/30/2019   Zolpidem tartrate Other (See Comments) 10/21/2010    Family History  Problem Relation Age of Onset   Heart disease Mother    Heart disease Father    Cancer Sister        breast   Cancer Sister        lung   Heart disease Brother     Social History   Socioeconomic History   Marital status: Widowed    Spouse name: Not on file   Number of children: 0   Years of education: Not on file   Highest education level: Not on file  Occupational History   Occupation: retired    Associate Professor: RETIRED  Tobacco Use   Smoking status: Every Day    Current packs/day: 1.00    Average packs/day: 1 pack/day for 45.0 years (45.0 ttl pk-yrs)    Types: Cigarettes   Smokeless tobacco: Never   Tobacco comments:    0.5ppd as of 10/24/2023  Vaping Use   Vaping status: Never Used  Substance and Sexual Activity   Alcohol use: No   Drug use: No   Sexual activity: Not on file  Other Topics Concern   Not on file  Social History Narrative   12/05/18 Lives with sister   Caffeine- 2-3 cups daily   Social Drivers of Corporate investment banker Strain: Not on file  Food Insecurity: No Food Insecurity (11/04/2023)   Hunger Vital Sign    Worried About Running Out of Food in the Last Year: Never true    Ran Out of Food in the Last Year: Never true  Transportation Needs: No Transportation Needs (11/04/2023)   PRAPARE - Administrator, Civil Service (Medical): No    Lack of Transportation (Non-Medical): No  Physical Activity: Not on file  Stress: Not on file  Social Connections: Socially Isolated (11/04/2023)   Social Connection and Isolation Panel    Frequency of Communication with Friends and Family: More than three times a week    Frequency of Social Gatherings with Friends and Family: More than three  times a week    Attends Religious Services: Never    Active Member of Clubs  or Organizations: No    Attends Banker Meetings: Never    Marital Status: Widowed  Intimate Partner Violence: Not At Risk (11/04/2023)   Humiliation, Afraid, Rape, and Kick questionnaire    Fear of Current or Ex-Partner: No    Emotionally Abused: No    Physically Abused: No    Sexually Abused: No    Review of Systems: Pertinent positive and negative review of systems were noted in the above HPI section.  All other review of systems was otherwise negative.   Physical Exam: Vital signs in last 24 hours: Temp:  [97.9 F (36.6 C)-98.9 F (37.2 C)] 97.9 F (36.6 C) (07/04 0750) Pulse Rate:  [67-91] 68 (07/04 0750) Resp:  [16-24] 18 (07/04 0750) BP: (120-170)/(33-108) 156/49 (07/04 0750) SpO2:  [92 %-100 %] 93 % (07/04 0750) Weight:  [56.7 kg-57.1 kg] 57.1 kg (07/04 0500) Last BM Date : 11/03/23 General:   Alert,  Well-developed, well-nourished, elderly white female pleasant and cooperative in NAD Head:  Normocephalic and atraumatic. Eyes:  Sclera clear, no icterus.   Conjunctiva pink. Ears:  Normal auditory acuity. Nose:  No deformity, discharge,  or lesions. Mouth:  No deformity or lesions.   Neck:  Supple; no masses or thyromegaly. Lungs:  Clear throughout to auscultation, somewhat decreased BS throughout . Heart:  Regular rate and rhythm; no murmurs, clicks, rubs,  or gallops. Abdomen:  Soft,nontender, BS active,nonpalp mass or hsm.   Rectal: Heme positive stool documented Msk:  Symmetrical without gross deformities. . Pulses:  Normal pulses noted. Extremities:  Without clubbing or edema. Neurologic:  Alert and  oriented x4;  grossly normal neurologically. Skin:  Intact without significant lesions or rashes.. Psych:  Alert and cooperative. Normal mood and affect.  Intake/Output from previous day: 07/03 0701 - 07/04 0700 In: 668.8 [Blood:668.8] Out: -  Intake/Output this shift: No intake/output data recorded.  Lab Results: Recent Labs    11/03/23 1819  11/04/23 0832  WBC 8.5 8.0  HGB 6.0* 9.4*  HCT 20.8* 28.7*  PLT 365 280   BMET Recent Labs    11/03/23 1819 11/04/23 0832  NA 137 139  K 4.0 3.8  CL 107 109  CO2 21* 22  GLUCOSE 117* 87  BUN 22 20  CREATININE 1.73* 1.48*  CALCIUM  8.9 8.5*   LFT Recent Labs    11/03/23 1819  PROT 5.8*  ALBUMIN  3.5  AST 15  ALT 10  ALKPHOS 38  BILITOT 0.4   PT/INR No results for input(s): LABPROT, INR in the last 72 hours. Hepatitis Panel No results for input(s): HEPBSAG, HCVAB, HEPAIGM, HEPBIGM in the last 72 hours.    IMPRESSION:  #20 83 year old female with progressive fatigue and dyspnea with exertion over the past couple of months, outpatient labs yesterday with finding of hemoglobin 6/MCV 83, down from her baseline hemoglobin in the 10 range. Admitted through the emergency room, stool documented heme positive. Patient has not noted any melena or hematochezia.  She was started on Eliquis  in February 2025 due to new diagnosis of atrial fibrillation, underwent cardioversion March 2025.  She was switched to Xarelto  within the past 2 weeks due to complaints of dizziness.  Suspect she has had subacute GI blood loss since initiating anticoagulation likely secondary to small bowel AVMs  #2 anemia acute on chronic secondary to above #3 chronic kidney disease stage III #4.  COPD #5.  Hypertension #6.  Hypothyroidism  Plan; okay for soft diet today, n.p.o. in a.m. Continue to hold Xarelto  Trend hemoglobin and transfuse as indicated for hemoglobin 7.5 or less  Will tentatively schedule her for small bowel endoscopy tomorrow with Dr. Federico.  High volume of procedure scheduled for tomorrow, possible that she will be able to be done until Sunday.  This was explained to the patient and she understands. I also discussed enteroscopy in detail with the patient including indications risks and benefits and she is agreeable to proceed. She  should be able to be discharged after  endoscopic evaluation mentions that she has appointment with her nephrologist on Monday.  May be prudent to discuss with her cardiologist regarding whether she needs to stay on Xarelto  post cardioversion as she has been maintaining sinus rhythm.    Taytem Ghattas PA-C 11/04/2023, 1:48 PM

## 2023-11-04 NOTE — Care Management Obs Status (Signed)
 MEDICARE OBSERVATION STATUS NOTIFICATION   Patient Details  Name: Joann Barnes MRN: [id number] Date of Birth: 08/31/1940   Medicare Observation Status Notification Given:  Yes    Jon Cruel 11/04/2023, 3:40 PM

## 2023-11-04 NOTE — H&P (View-Only) (Signed)
 Consultation  Referring Provider: TRH/ Rai Primary Care Physician:  Verdia Lombard, MD Primary Gastroenterologist:  Dr.Mann  Reason for Consultation: Marked anemia, heme positive stool in setting of Xarelto   HPI: Joann Barnes is a 83 y.o. female with history of COPD, chronic kidney disease stage III, hypertension, hyperlipidemia, and diagnosis of atrial fibrillation made in February 2025.  She was initially anticoagulated with Eliquis , she underwent cardioversion in March 2025.  She had recently reported dizziness to her cardiologist and was just transition to Xarelto  about a week and a half ago with the thought that Eliquis  may have been causing dizziness.  She had labs yesterday done as an outpatient prior to visit with her nephrologist and was found to have a hemoglobin of 6.  She was advised to be admitted for transfusions and further workup. Her last dose of Xarelto  was yesterday  Labs here show WBC of 8.5/hemoglobin 6/hematocrit 20.8/MCV 83/platelets 361 BNP 302 BUN 22/creatinine 1.73 Stool documented heme positive.  No abdominal imaging this admission.  She has been transfused 2 units of packed RBCs and hemoglobin is up to 9.4 today/hematocrit 28.7.  Reviewing her labs she does have a chronic anemia with hemoglobin 10.1 in March 2025 and hemoglobin 9.08 August 2023.  She has noticed that she has been feeling fatigued and notably more short of breath with ambulating over the past couple of months.  She has not noticed any melena or hematochezia, she has not had any change in her bowel habits, no complaints of abdominal discomfort, appetite has been fine, no nausea or vomiting.  She does have history of previously documented small bowel AVMs and had undergone workup in November 2022 for anemia and heme positive stool.  Enteroscopy at that time with finding of a 4 cm hiatal hernia moderate gastritis and multiple AVMs in the second portion of the duodenum treated with APC and  additional AVMs in the jejunum treated with APC.Joann Barnes   Past Medical History:  Diagnosis Date   Aortic stenosis    Mild AS 02/2016 echo   Cancer [facility])    skin   Chronic kidney disease, stage 3b (HCC) 11/03/2023   COPD (chronic obstructive pulmonary disease) (HCC)    Dyspnea    GERD (gastroesophageal reflux disease)    Hematuria    Hernia, hiatal    HTN (hypertension)    Hyperlipidemia    Hypothyroid    Migraine    OA (osteoarthritis)    OP (osteoporosis)    Other and unspecified angina pectoris    02/2016 - had non-ischemic stress test   Paroxysmal atrial fibrillation (HCC) 11/03/2023   Pneumonia    Spondylolisthesis of lumbar region     Past Surgical History:  Procedure Laterality Date   APPENDECTOMY     CARDIOVERSION N/A 07/27/2023   Procedure: CARDIOVERSION;  Surgeon: Mona Vinie BROCKS, MD;  Location: [facility];  Service: Cardiovascular;  Laterality: N/A;   CATARACT EXTRACTION, BILATERAL     CERVICAL DISC ARTHROPLASTY     x 2   COLONOSCOPY     ENTEROSCOPY N/A 03/06/2021   Procedure: ENTEROSCOPY;  Surgeon: Rollin Dover, MD;  Location: [facility] ENDOSCOPY;  Service: Endoscopy;  Laterality: N/A;   ESOPHAGOGASTRODUODENOSCOPY  07/19/00   HAND SURGERY Right 2014   HOT HEMOSTASIS N/A 03/06/2021   Procedure: HOT HEMOSTASIS (ARGON PLASMA COAGULATION/BICAP);  Surgeon: Rollin Dover, MD;  Location: [facility];  Service: Endoscopy;  Laterality: N/A;   LAMINECTOMY  05/23/2017   L4-5, L5-S1    Prior  to Admission medications   Medication Sig Start Date End Date Taking? Authorizing Provider  acetaminophen  (TYLENOL ) 500 MG tablet Take 500-1,000 mg by mouth as needed for mild pain (pain score 1-3) or headache.   Yes [provider]  albuterol  (ACCUNEB ) 1.25 MG/3ML nebulizer solution Take 1 ampule by nebulization 2 (two) times daily as needed for wheezing or shortness of breath.   Yes [provider]  AZO-CRANBERRY PO Take 2 tablets by mouth daily as needed (for  urinary symptoms).   Yes [provider]  calcium  carbonate (TUMS - DOSED IN MG ELEMENTAL CALCIUM ) 500 MG chewable tablet Chew 1 tablet by mouth daily as needed for heartburn.   Yes [provider]  Cholecalciferol (VITAMIN D3) 2000 units TABS Take 2,000 Units by mouth daily.   Yes [provider]  escitalopram  (LEXAPRO ) 20 MG tablet Take 20 mg by mouth at bedtime. 08/15/17  Yes [provider]  famotidine  (PEPCID ) 40 MG tablet Take 40 mg by mouth 2 (two) times daily. 03/31/21  Yes [provider]  Fluticasone -Umeclidin-Vilant 100-62.5-25 MCG/INH AEPB Inhale 1 puff into the lungs daily.   Yes [provider]  furosemide  (LASIX ) 20 MG tablet Take 20-40 mg by mouth daily as needed for edema.   Yes [provider]  levothyroxine  (SYNTHROID ) 25 MCG tablet Take 25 mcg by mouth daily before breakfast.   Yes [provider]  PROAIR  HFA 108 (90 Base) MCG/ACT inhaler Inhale 2 puffs into the lungs every 6 (six) hours as needed for wheezing or shortness of breath. 08/25/17  Yes [provider]  Rivaroxaban  (XARELTO ) 15 MG TABS tablet Take 1 tablet (15 mg total) by mouth daily with supper. 10/24/23  Yes Ladona Heinz, MD  verapamil  (CALAN -SR) 120 MG CR tablet Take 1 tablet (120 mg total) by mouth daily. 08/15/23 08/14/24 Yes Fenton, Clint R, PA  zoledronic  acid (RECLAST ) 5 MG/100ML SOLN injection Inject 5 mg into the vein once. Takes once a year   Yes [provider]  gabapentin  (NEURONTIN ) 300 MG capsule Take 300 mg by mouth 3 (three) times daily. Patient not taking: Reported on 11/03/2023    [provider]  potassium chloride  SA (KLOR-CON  M) 20 MEQ tablet Take 1 tablet (20 mEq total) by mouth daily. Patient not taking: Reported on 08/15/2023 07/22/23   Terra Fairy PARAS, PA-C  pravastatin  (PRAVACHOL ) 80 MG tablet Take 80 mg by mouth every evening.    [provider]    Current Facility-Administered Medications   Medication Dose Route Frequency Provider Last Rate Last Admin   0.9 %  sodium chloride  infusion (Manually program via Guardrails IV Fluids)   Intravenous Once Francesca Elsie CROME, MD       acetaminophen  (TYLENOL ) tablet 650 mg  650 mg Oral Q6H PRN Patel, Vishal R, MD   650 mg at 11/04/23 9676   Or   acetaminophen  (TYLENOL ) suppository 650 mg  650 mg Rectal Q6H PRN Patel, Vishal R, MD       albuterol  (PROVENTIL ) (2.5 MG/3ML) 0.083% nebulizer solution 2.5 mg  2.5 mg Inhalation Q6H PRN Patel, Vishal R, MD       budesonide -glycopyrrolate -formoterol  (BREZTRI ) 160-9-4.8 MCG/ACT inhaler 2 puff  2 puff Inhalation BID Tobie Jorie SAUNDERS, MD   2 puff at 11/04/23 9082   escitalopram  (LEXAPRO ) tablet 20 mg  20 mg Oral QHS Patel, Vishal R, MD       levothyroxine  (SYNTHROID ) tablet 25 mcg  25 mcg Oral Q0600 Tobie Jorie SAUNDERS, MD  25 mcg at 11/04/23 0559   ondansetron  (ZOFRAN ) tablet 4 mg  4 mg Oral Q6H PRN Patel, Vishal R, MD       Or   ondansetron  (ZOFRAN ) injection 4 mg  4 mg Intravenous Q6H PRN Patel, Vishal R, MD       pantoprazole  (PROTONIX ) injection 40 mg  40 mg Intravenous Q12H Patel, Vishal R, MD   40 mg at 11/04/23 9081   pravastatin  (PRAVACHOL ) tablet 80 mg  80 mg Oral QPM Patel, Vishal R, MD       senna-docusate (Senokot-S) tablet 1 tablet  1 tablet Oral QHS PRN Tobie Jorie SAUNDERS, MD       sodium chloride  flush (NS) 0.9 % injection 3 mL  3 mL Intravenous Q12H Tobie Jorie R, MD   3 mL at 11/04/23 1124   verapamil  (CALAN -SR) CR tablet 120 mg  120 mg Oral Daily Patel, Vishal R, MD   120 mg at 11/04/23 9081    Allergies as of 11/03/2023 - Review Complete 11/03/2023  Allergen Reaction Noted   Bactrim [sulfamethoxazole-trimethoprim] Shortness Of Breath 10/26/2011   Nicotine  Anaphylaxis, Itching, Rash, and Other (See Comments) 01/08/2011   Atorvastatin  Other (See Comments) 12/15/2017   Codeine Itching and Nausea And Vomiting 10/21/2010   Chantix  [varenicline  tartrate] Hives, Itching, Nausea And  Vomiting, Rash, and Other (See Comments) 10/21/2010   Wellbutrin  [bupropion ] Rash and Other (See Comments) 04/30/2019   Zolpidem tartrate Other (See Comments) 10/21/2010    Family History  Problem Relation Age of Onset   Heart disease Mother    Heart disease Father    Cancer Sister        breast   Cancer Sister        lung   Heart disease Brother     Social History   Socioeconomic History   Marital status: Widowed    Spouse name: Not on file   Number of children: 0   Years of education: Not on file   Highest education level: Not on file  Occupational History   Occupation: retired    Associate Professor: RETIRED  Tobacco Use   Smoking status: Every Day    Current packs/day: 1.00    Average packs/day: 1 pack/day for 45.0 years (45.0 ttl pk-yrs)    Types: Cigarettes   Smokeless tobacco: Never   Tobacco comments:    0.5ppd as of 10/24/2023  Vaping Use   Vaping status: Never Used  Substance and Sexual Activity   Alcohol use: No   Drug use: No   Sexual activity: Not on file  Other Topics Concern   Not on file  Social History Narrative   12/05/18 Lives with sister   Caffeine- 2-3 cups daily   Social Drivers of Corporate investment banker Strain: Not on file  Food Insecurity: No Food Insecurity (11/04/2023)   Hunger Vital Sign    Worried About Running Out of Food in the Last Year: Never true    Ran Out of Food in the Last Year: Never true  Transportation Needs: No Transportation Needs (11/04/2023)   PRAPARE - Administrator, Civil Service (Medical): No    Lack of Transportation (Non-Medical): No  Physical Activity: Not on file  Stress: Not on file  Social Connections: Socially Isolated (11/04/2023)   Social Connection and Isolation Panel    Frequency of Communication with Friends and Family: More than three times a week    Frequency of Social Gatherings with Friends and Family: More than three  times a week    Attends Religious Services: Never    Active Member of Clubs  or Organizations: No    Attends Banker Meetings: Never    Marital Status: Widowed  Intimate Partner Violence: Not At Risk (11/04/2023)   Humiliation, Afraid, Rape, and Kick questionnaire    Fear of Current or Ex-Partner: No    Emotionally Abused: No    Physically Abused: No    Sexually Abused: No    Review of Systems: Pertinent positive and negative review of systems were noted in the above HPI section.  All other review of systems was otherwise negative.   Physical Exam: Vital signs in last 24 hours: Temp:  [97.9 F (36.6 C)-98.9 F (37.2 C)] 97.9 F (36.6 C) (07/04 0750) Pulse Rate:  [67-91] 68 (07/04 0750) Resp:  [16-24] 18 (07/04 0750) BP: (120-170)/(33-108) 156/49 (07/04 0750) SpO2:  [92 %-100 %] 93 % (07/04 0750) Weight:  [56.7 kg-57.1 kg] 57.1 kg (07/04 0500) Last BM Date : 11/03/23 General:   Alert,  Well-developed, well-nourished, elderly white female pleasant and cooperative in NAD Head:  Normocephalic and atraumatic. Eyes:  Sclera clear, no icterus.   Conjunctiva pink. Ears:  Normal auditory acuity. Nose:  No deformity, discharge,  or lesions. Mouth:  No deformity or lesions.   Neck:  Supple; no masses or thyromegaly. Lungs:  Clear throughout to auscultation, somewhat decreased BS throughout . Heart:  Regular rate and rhythm; no murmurs, clicks, rubs,  or gallops. Abdomen:  Soft,nontender, BS active,nonpalp mass or hsm.   Rectal: Heme positive stool documented Msk:  Symmetrical without gross deformities. . Pulses:  Normal pulses noted. Extremities:  Without clubbing or edema. Neurologic:  Alert and  oriented x4;  grossly normal neurologically. Skin:  Intact without significant lesions or rashes.. Psych:  Alert and cooperative. Normal mood and affect.  Intake/Output from previous day: 07/03 0701 - 07/04 0700 In: 668.8 [Blood:668.8] Out: -  Intake/Output this shift: No intake/output data recorded.  Lab Results: Recent Labs    11/03/23 1819  11/04/23 0832  WBC 8.5 8.0  HGB 6.0* 9.4*  HCT 20.8* 28.7*  PLT 365 280   BMET Recent Labs    11/03/23 1819 11/04/23 0832  NA 137 139  K 4.0 3.8  CL 107 109  CO2 21* 22  GLUCOSE 117* 87  BUN 22 20  CREATININE 1.73* 1.48*  CALCIUM  8.9 8.5*   LFT Recent Labs    11/03/23 1819  PROT 5.8*  ALBUMIN  3.5  AST 15  ALT 10  ALKPHOS 38  BILITOT 0.4   PT/INR No results for input(s): LABPROT, INR in the last 72 hours. Hepatitis Panel No results for input(s): HEPBSAG, HCVAB, HEPAIGM, HEPBIGM in the last 72 hours.    IMPRESSION:  #20 83 year old female with progressive fatigue and dyspnea with exertion over the past couple of months, outpatient labs yesterday with finding of hemoglobin 6/MCV 83, down from her baseline hemoglobin in the 10 range. Admitted through the emergency room, stool documented heme positive. Patient has not noted any melena or hematochezia.  She was started on Eliquis  in February 2025 due to new diagnosis of atrial fibrillation, underwent cardioversion March 2025.  She was switched to Xarelto  within the past 2 weeks due to complaints of dizziness.  Suspect she has had subacute GI blood loss since initiating anticoagulation likely secondary to small bowel AVMs  #2 anemia acute on chronic secondary to above #3 chronic kidney disease stage III #4.  COPD #5.  Hypertension #6.  Hypothyroidism  Plan; okay for soft diet today, n.p.o. in a.m. Continue to hold Xarelto  Trend hemoglobin and transfuse as indicated for hemoglobin 7.5 or less  Will tentatively schedule her for small bowel endoscopy tomorrow with Dr. Federico.  High volume of procedure scheduled for tomorrow, possible that she will be able to be done until Sunday.  This was explained to the patient and she understands. I also discussed enteroscopy in detail with the patient including indications risks and benefits and she is agreeable to proceed. She  should be able to be discharged after  endoscopic evaluation mentions that she has appointment with her nephrologist on Monday.  May be prudent to discuss with her cardiologist regarding whether she needs to stay on Xarelto  post cardioversion as she has been maintaining sinus rhythm.    Taytem Ghattas PA-C 11/04/2023, 1:48 PM

## 2023-11-05 ENCOUNTER — Encounter (HOSPITAL_COMMUNITY): Payer: Self-pay | Admitting: Internal Medicine

## 2023-11-05 ENCOUNTER — Inpatient Hospital Stay (HOSPITAL_COMMUNITY): Admitting: Certified Registered Nurse Anesthetist

## 2023-11-05 ENCOUNTER — Encounter (HOSPITAL_COMMUNITY)
Admission: EM | Discharge status: Against Medical Advice | Payer: Self-pay | Source: Home / Self Care | Attending: Internal Medicine

## 2023-11-05 DIAGNOSIS — Z604 Social exclusion and rejection: Secondary | ICD-10-CM | POA: Diagnosis not present

## 2023-11-05 DIAGNOSIS — Z79890 Hormone replacement therapy (postmenopausal): Secondary | ICD-10-CM | POA: Diagnosis not present

## 2023-11-05 DIAGNOSIS — J449 Chronic obstructive pulmonary disease, unspecified: Secondary | ICD-10-CM | POA: Diagnosis not present

## 2023-11-05 DIAGNOSIS — R531 Weakness: Secondary | ICD-10-CM | POA: Diagnosis present

## 2023-11-05 DIAGNOSIS — K5520 Angiodysplasia of colon without hemorrhage: Secondary | ICD-10-CM

## 2023-11-05 DIAGNOSIS — K31811 Angiodysplasia of stomach and duodenum with bleeding: Secondary | ICD-10-CM | POA: Diagnosis not present

## 2023-11-05 DIAGNOSIS — K208 Other esophagitis without bleeding: Secondary | ICD-10-CM | POA: Diagnosis not present

## 2023-11-05 DIAGNOSIS — I10 Essential (primary) hypertension: Secondary | ICD-10-CM | POA: Diagnosis not present

## 2023-11-05 DIAGNOSIS — F17210 Nicotine dependence, cigarettes, uncomplicated: Secondary | ICD-10-CM | POA: Diagnosis not present

## 2023-11-05 DIAGNOSIS — E039 Hypothyroidism, unspecified: Secondary | ICD-10-CM | POA: Diagnosis not present

## 2023-11-05 DIAGNOSIS — K31819 Angiodysplasia of stomach and duodenum without bleeding: Secondary | ICD-10-CM

## 2023-11-05 DIAGNOSIS — Z8701 Personal history of pneumonia (recurrent): Secondary | ICD-10-CM | POA: Diagnosis not present

## 2023-11-05 DIAGNOSIS — D509 Iron deficiency anemia, unspecified: Secondary | ICD-10-CM | POA: Diagnosis not present

## 2023-11-05 DIAGNOSIS — I480 Paroxysmal atrial fibrillation: Secondary | ICD-10-CM

## 2023-11-05 DIAGNOSIS — Z8249 Family history of ischemic heart disease and other diseases of the circulatory system: Secondary | ICD-10-CM | POA: Diagnosis not present

## 2023-11-05 DIAGNOSIS — I129 Hypertensive chronic kidney disease with stage 1 through stage 4 chronic kidney disease, or unspecified chronic kidney disease: Secondary | ICD-10-CM | POA: Diagnosis not present

## 2023-11-05 DIAGNOSIS — E785 Hyperlipidemia, unspecified: Secondary | ICD-10-CM | POA: Diagnosis not present

## 2023-11-05 DIAGNOSIS — D649 Anemia, unspecified: Secondary | ICD-10-CM | POA: Diagnosis not present

## 2023-11-05 DIAGNOSIS — D631 Anemia in chronic kidney disease: Secondary | ICD-10-CM | POA: Diagnosis not present

## 2023-11-05 DIAGNOSIS — N1832 Chronic kidney disease, stage 3b: Secondary | ICD-10-CM | POA: Diagnosis not present

## 2023-11-05 DIAGNOSIS — K449 Diaphragmatic hernia without obstruction or gangrene: Secondary | ICD-10-CM

## 2023-11-05 DIAGNOSIS — Z7901 Long term (current) use of anticoagulants: Secondary | ICD-10-CM | POA: Diagnosis not present

## 2023-11-05 DIAGNOSIS — K219 Gastro-esophageal reflux disease without esophagitis: Secondary | ICD-10-CM | POA: Diagnosis not present

## 2023-11-05 DIAGNOSIS — Z8719 Personal history of other diseases of the digestive system: Secondary | ICD-10-CM | POA: Diagnosis not present

## 2023-11-05 DIAGNOSIS — I350 Nonrheumatic aortic (valve) stenosis: Secondary | ICD-10-CM | POA: Diagnosis not present

## 2023-11-05 DIAGNOSIS — M1990 Unspecified osteoarthritis, unspecified site: Secondary | ICD-10-CM | POA: Diagnosis not present

## 2023-11-05 DIAGNOSIS — D62 Acute posthemorrhagic anemia: Secondary | ICD-10-CM | POA: Diagnosis not present

## 2023-11-05 DIAGNOSIS — F32A Depression, unspecified: Secondary | ICD-10-CM | POA: Diagnosis not present

## 2023-11-05 DIAGNOSIS — J410 Simple chronic bronchitis: Secondary | ICD-10-CM | POA: Diagnosis not present

## 2023-11-05 DIAGNOSIS — F419 Anxiety disorder, unspecified: Secondary | ICD-10-CM | POA: Diagnosis not present

## 2023-11-05 DIAGNOSIS — M810 Age-related osteoporosis without current pathological fracture: Secondary | ICD-10-CM | POA: Diagnosis not present

## 2023-11-05 DIAGNOSIS — Z885 Allergy status to narcotic agent status: Secondary | ICD-10-CM | POA: Diagnosis not present

## 2023-11-05 HISTORY — PX: ENTEROSCOPY: SHX5533

## 2023-11-05 HISTORY — PX: HOT HEMOSTASIS: SHX5433

## 2023-11-05 LAB — TYPE AND SCREEN
ABO/RH(D): A POS
Antibody Screen: NEGATIVE
Unit division: 0
Unit division: 0

## 2023-11-05 LAB — BPAM RBC
Blood Product Expiration Date: 202508042359
Blood Product Expiration Date: 202508042359
ISSUE DATE / TIME: 202507032349
ISSUE DATE / TIME: 202507040233
Unit Type and Rh: 6200
Unit Type and Rh: 6200

## 2023-11-05 LAB — HEMOGLOBIN AND HEMATOCRIT, BLOOD
HCT: 32.5 % — ABNORMAL LOW (ref 36.0–46.0)
Hemoglobin: 10.4 g/dL — ABNORMAL LOW (ref 12.0–15.0)

## 2023-11-05 SURGERY — ENTEROSCOPY
Anesthesia: Monitor Anesthesia Care

## 2023-11-05 MED ORDER — LACTATED RINGERS IV SOLN: INTRAVENOUS | Status: DC | PRN

## 2023-11-05 MED ORDER — LACTATED RINGERS IV SOLN: INTRAVENOUS | Status: DC

## 2023-11-05 MED ORDER — PHENYLEPHRINE HCL (PRESSORS) 10 MG/ML IV SOLN
INTRAVENOUS | Status: DC | PRN
  Administered 2023-11-05: 160 ug via INTRAVENOUS
  Administered 2023-11-05 (×2): 80 ug via INTRAVENOUS

## 2023-11-05 MED ORDER — PROPOFOL 10 MG/ML IV BOLUS
INTRAVENOUS | Status: DC | PRN
  Administered 2023-11-05: 20 mg via INTRAVENOUS
  Administered 2023-11-05: 30 mg via INTRAVENOUS
  Administered 2023-11-05: 20 mg via INTRAVENOUS
  Administered 2023-11-05: 40 mg via INTRAVENOUS
  Administered 2023-11-05: 20 mg via INTRAVENOUS
  Administered 2023-11-05: 30 mg via INTRAVENOUS
  Administered 2023-11-05: 20 mg via INTRAVENOUS
  Administered 2023-11-05: 30 mg via INTRAVENOUS
  Administered 2023-11-05: 40 mg via INTRAVENOUS
  Administered 2023-11-05: 30 mg via INTRAVENOUS

## 2023-11-05 MED ORDER — FERROUS SULFATE 325 (65 FE) MG PO TABS: 325.0000 mg | ORAL_TABLET | Freq: Every day | ORAL | Status: DC

## 2023-11-05 MED ORDER — LIDOCAINE 2% (20 MG/ML) 5 ML SYRINGE
INTRAMUSCULAR | Status: DC | PRN
  Administered 2023-11-05: 60 mg via INTRAVENOUS

## 2023-11-05 NOTE — Progress Notes (Signed)
    Against Medical Advice   Joann Barnes expresses desire to leave the Hospital immediately. Patient has been warned that this is not medically advisable at this time and can result in medical complications such as disability and even death.  I have spoken to the patient and tried my very best to convince her to stay overnight but she is adamant about leaving immediately and has already called family to pick her up from the hospital.  Patient is AAO x 4 and has full decision-making capacity.  She understands and accepts the risks involved with her decision to leave AGAINST MEDICAL ADVICE and assumes full responsibilty of this decision.  Patient has also been advised that if she feels the need for further medical assistance to return to any available ER or dial [phone].

## 2023-11-05 NOTE — Interval H&P Note (Signed)
 History and Physical Interval Note:  11/05/2023 3:17 PM  Joann Barnes  has presented today for surgery, with the diagnosis of anemia, hem,e positive stool on Xarelto , Hx of AVM's.  The various methods of treatment have been discussed with the patient and family. After consideration of risks, benefits and other options for treatment, the patient has consented to  Procedure(s): ENTEROSCOPY (N/A) as a surgical intervention.  The patient's history has been reviewed, patient examined, no change in status, stable for surgery.  I have reviewed the patient's chart and labs.  Questions were answered to the patient's satisfaction.     Raul Winterhalter C Dontario Evetts

## 2023-11-05 NOTE — Transfer of Care (Signed)
 Immediate Anesthesia Transfer of Care Note  Patient: Joann Barnes  Procedure(s) Performed: ENTEROSCOPY EGD, WITH ARGON PLASMA COAGULATION  Patient Location: PACU  Anesthesia Type:MAC  Level of Consciousness: awake, drowsy, and patient cooperative  Airway & Oxygen  Therapy: Patient Spontanous Breathing  Post-op Assessment: Report given to RN and Post -op Vital signs reviewed and stable  Post vital signs: Reviewed and stable  Last Vitals:  Vitals Value Taken Time  BP 108/85 11/05/23 17:23  Temp 98.7 11/05/23 1724  Pulse 63 11/05/23 17:24  Resp 26 11/05/23 17:24  SpO2 94 % 11/05/23 17:24  Vitals shown include unfiled device data.    Patients Stated Pain Goal: 0 (11/05/23 0954)  Complications: No notable events documented.

## 2023-11-05 NOTE — Plan of Care (Signed)

## 2023-11-05 NOTE — Op Note (Addendum)
 [facility] Patient Name: Joann Barnes Procedure Date : 11/05/2023 MRN: [id number] Attending MD: Rosario Estefana Kidney , , [phone] Date of Birth: October 10, 1940 CSN: [id number] Age: 83 Admit Type: Inpatient Procedure:                Small bowel enteroscopy Indications:              Iron deficiency anemia Providers:                Rosario Estefana Kidney Haskel Dea, Technician,                            Collene Edu, RN Referring MD:             Hospitalist team Medicines:                Monitored Anesthesia Care Complications:            No immediate complications. Estimated Blood Loss:     Estimated blood loss was minimal. Procedure:                Pre-Anesthesia Assessment:                           - Prior to the procedure, a History and Physical                            was performed, and patient medications and                            allergies were reviewed. The patient's tolerance of                            previous anesthesia was also reviewed. The risks                            and benefits of the procedure and the sedation                            options and risks were discussed with the patient.                            All questions were answered, and informed consent                            was obtained. Prior Anticoagulants: The patient has                            taken Xarelto  (rivaroxaban ), last dose was 2 days                            prior to procedure. ASA Grade Assessment: III - A                            patient with severe systemic disease. After  reviewing the risks and benefits, the patient was                            deemed in satisfactory condition to undergo the                            procedure.                           After obtaining informed consent, the endoscope was                            passed under direct vision. Throughout the                            procedure, the  patient's blood pressure, pulse, and                            oxygen  saturations were monitored continuously. The                            [id number]-[id number] ([id number]) Olympus colonoscope was                            introduced through the mouth and advanced to the                            proximal jejunum. The small bowel enteroscopy was                            accomplished without difficulty. The patient                            tolerated the procedure well. Scope In: Scope Out: Findings:      LA Grade A (one or more mucosal breaks less than 5 mm, not extending       between tops of 2 mucosal folds) esophagitis with no bleeding was found       in the distal esophagus.      A hiatal hernia was present.      Four angioectasias with no bleeding were found in the second portion of       the duodenum and in the fourth portion of the duodenum. Coagulation for       bleeding prevention using argon plasma at 0.8 liters/minute and 20 watts       was successful.      Two angioectasias with no bleeding were found in the proximal jejunum.       Coagulation for bleeding prevention using argon plasma at 0.8       liters/minute and 20 watts was successful. Impression:               - LA Grade A esophagitis with no bleeding.                           - Hiatal hernia.                           -  Four non-bleeding angioectasias in the duodenum.                            Treated with argon plasma coagulation (APC).                           - Two non-bleeding angioectasias in the jejunum.                            Treated with argon plasma coagulation (APC).                           - No specimens collected. Recommendation:           - Return patient to hospital ward for ongoing care.                           - It is suspected that the patient's anemia is due                            to bleeding from small bowel angioectasias.                           - PPI BID for 10 weeks, then QD.                            - Will start oral iron supplements.                           - Okay to restart Xarelto  tomorrow if no additional                            signs of bleeding.                           - I messaged Dr. Kristie and Dr. Rollin to request                            outpatient GI follow up of her IDA to make sure                            that she does not need any further work up.                           - The findings and recommendations were discussed                            with the patient. Procedure Code(s):        --- Professional ---                           [phone], Small intestinal endoscopy, enteroscopy                            beyond second  portion of duodenum, not including                            ileum; with control of bleeding (eg, injection,                            bipolar cautery, unipolar cautery, laser, heater                            probe, stapler, plasma coagulator) Diagnosis Code(s):        --- Professional ---                           K20.90, Esophagitis, unspecified without bleeding                           K44.9, Diaphragmatic hernia without obstruction or                            gangrene                           K31.819, Angiodysplasia of stomach and duodenum                            without bleeding                           K55.20, Angiodysplasia of colon without hemorrhage                           D50.9, Iron deficiency anemia, unspecified CPT copyright 2022 American Medical Association. All rights reserved. The codes documented in this report are preliminary and upon coder review may  be revised to meet current compliance requirements. Dr Estefana Federico Rosario Estefana Federico,  11/05/2023 5:35:46 PM Number of Addenda: 0

## 2023-11-05 NOTE — Anesthesia Preprocedure Evaluation (Addendum)
 Anesthesia Evaluation  Patient identified by MRN, date of birth, ID band Patient awake    Reviewed: Allergy & Precautions, NPO status , Patient's Chart, lab work & pertinent test results  Airway Mallampati: II  TM Distance: >3 FB Neck ROM: Full    Dental  (+) Edentulous Lower, Edentulous Upper   Pulmonary COPD, Current Smoker and Patient abstained from smoking.   Pulmonary exam normal        Cardiovascular hypertension, Normal cardiovascular exam+ dysrhythmias Atrial Fibrillation + Valvular Problems/Murmurs AS and MR   ECHO: 1. Left ventricular ejection fraction, by estimation, is 60 to 65%. The  left ventricle has normal function. The left ventricle has no regional  wall motion abnormalities. Left ventricular diastolic function could not  be evaluated. Elevated left atrial  pressure.   2. Right ventricular systolic function is normal. The right ventricular  size is normal.   3. Left atrial size was moderately dilated.   4. The mitral valve is normal in structure. Moderate mitral valve  regurgitation. No evidence of mitral stenosis.   5. Tricuspid valve regurgitation is moderate.   6. The aortic valve is calcified. Aortic valve regurgitation is moderate.  Mild to moderate aortic valve stenosis.   7. The inferior vena cava is normal in size with greater than 50%  respiratory variability, suggesting right atrial pressure of 3 mmHg.     Neuro/Psych  Headaches PSYCHIATRIC DISORDERS       Neuromuscular disease    GI/Hepatic Neg liver ROS, hiatal hernia,GERD  ,,  Endo/Other  Hypothyroidism    Renal/GU Renal InsufficiencyRenal disease     Musculoskeletal  (+) Arthritis ,    Abdominal   Peds  Hematology  (+) Blood dyscrasia (Xarelto ), anemia   Anesthesia Other Findings anemia, heme positive stool on Xarelto , Hx of AVM's  Reproductive/Obstetrics                              Anesthesia  Physical Anesthesia Plan  ASA: 4  Anesthesia Plan: MAC   Post-op Pain Management:    Induction:   PONV Risk Score and Plan: 1 and Propofol  infusion and Treatment may vary due to age or medical condition  Airway Management Planned: Nasal Cannula  Additional Equipment:   Intra-op Plan:   Post-operative Plan:   Informed Consent: I have reviewed the patients History and Physical, chart, labs and discussed the procedure including the risks, benefits and alternatives for the proposed anesthesia with the patient or authorized representative who has indicated his/her understanding and acceptance.       Plan Discussed with: CRNA  Anesthesia Plan Comments:         Anesthesia Quick Evaluation

## 2023-11-05 NOTE — Anesthesia Postprocedure Evaluation (Signed)
 Anesthesia Post Note  Patient: Joann Barnes  Procedure(s) Performed: ENTEROSCOPY EGD, WITH ARGON PLASMA COAGULATION     Patient location during evaluation: Endoscopy Anesthesia Type: MAC Level of consciousness: awake Pain management: pain level controlled Vital Signs Assessment: post-procedure vital signs reviewed and stable Respiratory status: spontaneous breathing, nonlabored ventilation and respiratory function stable Cardiovascular status: blood pressure returned to baseline and stable Postop Assessment: no apparent nausea or vomiting Anesthetic complications: no   There were no known notable events for this encounter.  Last Vitals:  Vitals:   11/05/23 1731 11/05/23 1740  BP: (!) 113/90 107/78  Pulse: 68 63  Resp: (!) 21 18  Temp:    SpO2: 94% (!) 82%    Last Pain:  Vitals:   11/05/23 1725  TempSrc: Axillary  PainSc: 0-No pain                 Joann Barnes

## 2023-11-05 NOTE — Progress Notes (Signed)
 [facility]                                                                              Joann Barnes, is a 83 y.o. 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  Patient was newly diagnosed with A-fib in February 2025.  She was initially started on Eliquis , underwent cardioversion on 3/26 converting to NSR.   Per patient, over the last 2 weeks, had significant lightheadedness on standing and with exertion, fatigue and shortness of breath. There was concern that this was a side effect of Eliquis  and she was switched to Xarelto  about 1.5 weeks ago. Per patient, she had labs obtained by her nephrologist for her upcoming appointment and hemoglobin was down to 6.2.  Patient states that she has not seen any obvious bleeding.  She does have a history of prior GI bleed from small bowel AVMs treated during small bowel endoscopy in November 2022.   Assessment & Plan    Symptomatic acute on chronic anemia History of GI bleed from small bowel AVMs: - Hemoglobin 6.0 on admission compared to baseline 9-10.  FOBT positive  - On anticoagulation with Xarelto , placed on hold  - Transfused 2 units packed RBCs, hemoglobin stable, 10.4 today - GI consulted, continue IV PPI -Plan for small bowel enteroscopy today   Paroxysmal atrial fibrillation: Remains in sinus rhythm on admission with controlled rate.  - Holding Xarelto .  Continue verapamil .   CKD stage IIIb: - Renal function appears to be borderline stage IIIb-IV and relatively stable.    COPD: -Currently no acute wheezing - continue Trelegy Ellipta and albuterol  as needed.   Hypertension: Continue verapamil .   Hyperlipidemia: Continue pravastatin .   Hypothyroidism: Continue Synthroid .   Depression/anxiety: Continue Lexapro .   Estimated body mass index is 21.28 kg/m as calculated from the following:   Height as of this encounter: 5' 3 (1.6 m).   Weight as of this encounter: 54.5 kg.  Code Status: Full code DVT Prophylaxis:  SCDs Start: 11/03/23 2331   Level of Care: Level of care: Telemetry Medical Family Communication:  Disposition Plan:      Remains inpatient appropriate:   Pending GI evaluation   Procedures:    Consultants:   GI  Antimicrobials:   Anti-infectives (From admission, onward)    None          Medications  sodium chloride    Intravenous Once   budesonide -glycopyrrolate -formoterol   2 puff Inhalation BID   escitalopram   20 mg Oral QHS   levothyroxine   25 mcg Oral Q0600   pantoprazole  (PROTONIX ) IV  40 mg Intravenous Q12H   pravastatin   80 mg Oral QPM   sodium chloride  flush  3 mL Intravenous Q12H   verapamil   120 mg Oral Daily      Subjective:   Elle Vezina was seen and examined today.  No acute complaints today, awaiting endoscopy.  No nausea vomiting, chest pain, abdominal pain or any active bleeding.    Objective:   Vitals:   11/04/23 2113 11/04/23 2132 11/05/23 0616 11/05/23 0837  BP:  (!) 158/44 (!) 157/33 (!) 146/63  Pulse:  70 71 67  Resp:  18 18   Temp:  98.1 F (36.7 C) 98 F (36.7 C) 98.2 F (36.8 C)  TempSrc:      SpO2: 96% 93% 95% 94%  Weight:   54.5 kg   Height:        Intake/Output Summary (Last 24 hours) at 11/05/2023 1240 Last data filed at 11/05/2023 0840 Gross per 24 hour  Intake 0 ml  Output --  Net 0 ml     Wt Readings from Last 3 Encounters:  11/05/23 54.5 kg  10/24/23 56.7 kg  08/15/23 56.2 kg    Physical Exam General: Alert and oriented x 3, NAD Cardiovascular: S1 S2  clear, RRR.  Respiratory: CTAB, no wheezing, rales or rhonchi Gastrointestinal: Soft, nontender, nondistended, NBS Ext: no pedal edema bilaterally Neuro: no new deficits Psych: Normal affect      Data Reviewed:  I have personally reviewed following labs    CBC Lab Results  Component Value Date   WBC 8.0 11/04/2023   RBC 3.50 (L) 11/04/2023   HGB 10.4 (L) 11/05/2023   HCT 32.5 (L) 11/05/2023   MCV 82.0 11/04/2023   MCH 26.9 11/04/2023   PLT 280 11/04/2023   MCHC 32.8 11/04/2023   RDW 17.4 (H) 11/04/2023   LYMPHSABS 1.4 11/03/2023   MONOABS 0.8 11/03/2023   EOSABS 0.1 11/03/2023   BASOSABS 0.1 11/03/2023     Last metabolic panel Lab Results  Component Value Date   NA 139 11/04/2023   K 3.8 11/04/2023   CL 109 11/04/2023   CO2 22 11/04/2023   BUN 20 11/04/2023   CREATININE 1.48 (H) 11/04/2023   GLUCOSE 87 11/04/2023   GFRNONAA 35 (L) 11/04/2023   GFRAA 49 (L) 12/15/2017   CALCIUM  8.5 (L) 11/04/2023   PROT 5.8 (L) 11/03/2023   ALBUMIN  3.5 11/03/2023   BILITOT 0.4 11/03/2023   ALKPHOS 38 11/03/2023   AST 15 11/03/2023   ALT 10 11/03/2023   ANIONGAP 8 11/04/2023    CBG (last 3)  No results for input(s): GLUCAP in the last 72 hours.    Coagulation Profile: No results for input(s): INR, PROTIME in the last 168 hours.   Radiology Studies: I have personally reviewed the imaging studies  DG Chest 2 View Result Date: 11/03/2023 CLINICAL DATA:  Shortness of breath EXAM: CHEST - 2 VIEW COMPARISON:  January 14, 2022, Sep 03, 2021, February 05, 2021 FINDINGS: Nodular opacities in both upper lung zones on the frontal radiograph, without correlate on the lateral radiograph, likely artifact from costochondral calcification. No focal airspace consolidation, pleural effusion, or pneumothorax. Mild cardiomegaly. Tortuous aorta with aortic atherosclerosis. No acute fracture or destructive lesions. Multilevel degenerative disc disease of the spine. Osteopenia. IMPRESSION:  1. No acute cardiopulmonary abnormality. 2. Nodular opacities in both upper lung zones on the frontal radiograph, without correlate on the lateral radiograph, likely artifact from costochondral calcification. Nonemergent chest CT recommended for confirmation. Electronically Signed   By: Rogelia Carlean HERO.D.  On: 11/03/2023 18:22       Kadisha Goodine M.D. [facility] 11/05/2023, 12:40 PM  Available via Epic secure chat 7am-7pm After 7 pm, please refer to night coverage provider listed on amion.

## 2023-11-06 ENCOUNTER — Encounter (HOSPITAL_COMMUNITY): Payer: Self-pay | Admitting: Internal Medicine

## 2023-11-07 DIAGNOSIS — E785 Hyperlipidemia, unspecified: Secondary | ICD-10-CM | POA: Diagnosis not present

## 2023-11-07 DIAGNOSIS — N189 Chronic kidney disease, unspecified: Secondary | ICD-10-CM | POA: Diagnosis not present

## 2023-11-07 DIAGNOSIS — D631 Anemia in chronic kidney disease: Secondary | ICD-10-CM | POA: Diagnosis not present

## 2023-11-07 DIAGNOSIS — N1832 Chronic kidney disease, stage 3b: Secondary | ICD-10-CM | POA: Diagnosis not present

## 2023-11-07 DIAGNOSIS — N2581 Secondary hyperparathyroidism of renal origin: Secondary | ICD-10-CM | POA: Diagnosis not present

## 2023-11-07 DIAGNOSIS — I129 Hypertensive chronic kidney disease with stage 1 through stage 4 chronic kidney disease, or unspecified chronic kidney disease: Secondary | ICD-10-CM | POA: Diagnosis not present

## 2023-11-10 DIAGNOSIS — K922 Gastrointestinal hemorrhage, unspecified: Secondary | ICD-10-CM | POA: Diagnosis not present

## 2023-11-10 DIAGNOSIS — E782 Mixed hyperlipidemia: Secondary | ICD-10-CM | POA: Diagnosis not present

## 2023-11-10 DIAGNOSIS — Z09 Encounter for follow-up examination after completed treatment for conditions other than malignant neoplasm: Secondary | ICD-10-CM | POA: Diagnosis not present

## 2023-11-10 DIAGNOSIS — I350 Nonrheumatic aortic (valve) stenosis: Secondary | ICD-10-CM | POA: Diagnosis not present

## 2023-11-10 DIAGNOSIS — D692 Other nonthrombocytopenic purpura: Secondary | ICD-10-CM | POA: Diagnosis not present

## 2023-11-10 DIAGNOSIS — E538 Deficiency of other specified B group vitamins: Secondary | ICD-10-CM | POA: Diagnosis not present

## 2023-11-10 DIAGNOSIS — J449 Chronic obstructive pulmonary disease, unspecified: Secondary | ICD-10-CM | POA: Diagnosis not present

## 2023-11-10 DIAGNOSIS — D500 Iron deficiency anemia secondary to blood loss (chronic): Secondary | ICD-10-CM | POA: Diagnosis not present

## 2023-11-10 DIAGNOSIS — E039 Hypothyroidism, unspecified: Secondary | ICD-10-CM | POA: Diagnosis not present

## 2023-11-10 DIAGNOSIS — N1832 Chronic kidney disease, stage 3b: Secondary | ICD-10-CM | POA: Diagnosis not present

## 2023-11-10 DIAGNOSIS — I6523 Occlusion and stenosis of bilateral carotid arteries: Secondary | ICD-10-CM | POA: Diagnosis not present

## 2023-11-10 DIAGNOSIS — I700 Atherosclerosis of aorta: Secondary | ICD-10-CM | POA: Diagnosis not present

## 2023-11-17 DIAGNOSIS — D500 Iron deficiency anemia secondary to blood loss (chronic): Secondary | ICD-10-CM | POA: Diagnosis not present

## 2023-11-24 DIAGNOSIS — D500 Iron deficiency anemia secondary to blood loss (chronic): Secondary | ICD-10-CM | POA: Diagnosis not present

## 2023-11-29 DIAGNOSIS — K5904 Chronic idiopathic constipation: Secondary | ICD-10-CM | POA: Diagnosis not present

## 2023-11-29 DIAGNOSIS — K5520 Angiodysplasia of colon without hemorrhage: Secondary | ICD-10-CM | POA: Diagnosis not present

## 2023-11-29 DIAGNOSIS — Z860100 Personal history of colon polyps, unspecified: Secondary | ICD-10-CM | POA: Diagnosis not present

## 2023-11-29 DIAGNOSIS — D509 Iron deficiency anemia, unspecified: Secondary | ICD-10-CM | POA: Diagnosis not present

## 2023-11-29 DIAGNOSIS — K2100 Gastro-esophageal reflux disease with esophagitis, without bleeding: Secondary | ICD-10-CM | POA: Diagnosis not present

## 2023-11-29 DIAGNOSIS — K52831 Collagenous colitis: Secondary | ICD-10-CM | POA: Diagnosis not present

## 2023-12-06 DIAGNOSIS — K5520 Angiodysplasia of colon without hemorrhage: Secondary | ICD-10-CM | POA: Diagnosis not present

## 2023-12-06 DIAGNOSIS — D509 Iron deficiency anemia, unspecified: Secondary | ICD-10-CM | POA: Diagnosis not present

## 2024-01-05 DIAGNOSIS — D500 Iron deficiency anemia secondary to blood loss (chronic): Secondary | ICD-10-CM | POA: Diagnosis not present

## 2024-01-20 DIAGNOSIS — Z23 Encounter for immunization: Secondary | ICD-10-CM | POA: Diagnosis not present

## 2024-01-27 ENCOUNTER — Other Ambulatory Visit (HOSPITAL_COMMUNITY): Payer: Self-pay | Admitting: Physician Assistant

## 2024-02-07 DIAGNOSIS — D649 Anemia, unspecified: Secondary | ICD-10-CM | POA: Diagnosis not present

## 2024-02-24 ENCOUNTER — Inpatient Hospital Stay: Attending: Hematology and Oncology | Admitting: Hematology and Oncology

## 2024-02-24 ENCOUNTER — Encounter: Payer: Self-pay | Admitting: Hematology and Oncology

## 2024-02-24 ENCOUNTER — Inpatient Hospital Stay

## 2024-02-24 VITALS — BP 138/40 | HR 68 | Temp 98.2°F | Resp 18 | Ht 63.0 in | Wt 123.6 lb

## 2024-02-24 DIAGNOSIS — D539 Nutritional anemia, unspecified: Secondary | ICD-10-CM | POA: Insufficient documentation

## 2024-02-24 DIAGNOSIS — Z803 Family history of malignant neoplasm of breast: Secondary | ICD-10-CM | POA: Diagnosis not present

## 2024-02-24 DIAGNOSIS — Z801 Family history of malignant neoplasm of trachea, bronchus and lung: Secondary | ICD-10-CM | POA: Insufficient documentation

## 2024-02-24 DIAGNOSIS — Z7901 Long term (current) use of anticoagulants: Secondary | ICD-10-CM | POA: Diagnosis not present

## 2024-02-24 DIAGNOSIS — K5520 Angiodysplasia of colon without hemorrhage: Secondary | ICD-10-CM | POA: Diagnosis not present

## 2024-02-24 DIAGNOSIS — D649 Anemia, unspecified: Secondary | ICD-10-CM

## 2024-02-24 DIAGNOSIS — I480 Paroxysmal atrial fibrillation: Secondary | ICD-10-CM | POA: Insufficient documentation

## 2024-02-24 DIAGNOSIS — F17210 Nicotine dependence, cigarettes, uncomplicated: Secondary | ICD-10-CM | POA: Diagnosis not present

## 2024-02-24 DIAGNOSIS — Z720 Tobacco use: Secondary | ICD-10-CM

## 2024-02-24 LAB — CBC WITH DIFFERENTIAL (CANCER CENTER ONLY)
Abs Immature Granulocytes: 0.02 K/uL (ref 0.00–0.07)
Basophils Absolute: 0.1 K/uL (ref 0.0–0.1)
Basophils Relative: 1 %
Eosinophils Absolute: 0.1 K/uL (ref 0.0–0.5)
Eosinophils Relative: 1 %
HCT: 19.4 % — ABNORMAL LOW (ref 36.0–46.0)
Hemoglobin: 6 g/dL — CL (ref 12.0–15.0)
Immature Granulocytes: 0 %
Lymphocytes Relative: 18 %
Lymphs Abs: 1.4 K/uL (ref 0.7–4.0)
MCH: 25.9 pg — ABNORMAL LOW (ref 26.0–34.0)
MCHC: 30.9 g/dL (ref 30.0–36.0)
MCV: 83.6 fL (ref 80.0–100.0)
Monocytes Absolute: 0.9 K/uL (ref 0.1–1.0)
Monocytes Relative: 12 %
Neutro Abs: 5.3 K/uL (ref 1.7–7.7)
Neutrophils Relative %: 68 %
Platelet Count: 298 K/uL (ref 150–400)
RBC: 2.32 MIL/uL — ABNORMAL LOW (ref 3.87–5.11)
RDW: 19.6 % — ABNORMAL HIGH (ref 11.5–15.5)
WBC Count: 7.8 K/uL (ref 4.0–10.5)
nRBC: 0 % (ref 0.0–0.2)

## 2024-02-24 LAB — IRON AND IRON BINDING CAPACITY (CC-WL,HP ONLY)
Iron: 10 ug/dL — ABNORMAL LOW (ref 28–170)
Saturation Ratios: 2 % — ABNORMAL LOW (ref 10.4–31.8)
TIBC: 442 ug/dL (ref 250–450)
UIBC: 432 ug/dL (ref 148–442)

## 2024-02-24 LAB — SAMPLE TO BLOOD BANK

## 2024-02-24 LAB — PREPARE RBC (CROSSMATCH)

## 2024-02-24 LAB — FERRITIN: Ferritin: 14 ng/mL (ref 11–307)

## 2024-02-24 NOTE — Assessment & Plan Note (Signed)
 Due to high risk of stroke, she will remain on anticoagulation therapy for now

## 2024-02-24 NOTE — Assessment & Plan Note (Addendum)
 The cause of her anemia is multifactorial, likely combination of iron deficiency and anemia of chronic kidney disease The first order business would be to get her out of trouble;  We discussed some of the risks, benefits, and alternatives of blood transfusions. The patient is symptomatic from anemia and the hemoglobin level is critically low.  Some of the side-effects to be expected including risks of transfusion reactions, chills, infection, syndrome of volume overload and risk of hospitalization from various reasons and the patient is willing to proceed and went ahead to sign consent today. I will order 2 units of blood today  Given recent hospitalization, I will monitor her blood count carefully I plan to replenish iron supply with intravenous iron infusion for rapid response After blood transfusion, she should get intravenous iron infusion The patient has been receiving intravenous iron infusion through her primary care doctor's office We will contact his office to see if they can continue IV iron infusion or not If not, we will get IV iron infusion scheduled with infusion center on [address]  Once she has adequate intravenous iron replacement therapy, if she is still anemic with hemoglobin less than 11, I will prescribe darbepoetin injection to treat anemia chronic kidney disease  She likely has chronic GI bleed secondary to chronic anticoagulation therapy and presence of abnormal AVMs

## 2024-02-24 NOTE — Progress Notes (Signed)
 [facility] CONSULT NOTE  Patient Care Team: Verdia Lombard, MD as PCP - General (Internal Medicine) Ladona Heinz, MD as PCP - Cardiology (Cardiology)  ASSESSMENT & PLAN:  Deficiency anemia The cause of her anemia is multifactorial, likely combination of iron deficiency and anemia of chronic kidney disease The first order business would be to get her out of trouble;  We discussed some of the risks, benefits, and alternatives of blood transfusions. The patient is symptomatic from anemia and the hemoglobin level is critically low.  Some of the side-effects to be expected including risks of transfusion reactions, chills, infection, syndrome of volume overload and risk of hospitalization from various reasons and the patient is willing to proceed and went ahead to sign consent today. I will order 2 units of blood today  Given recent hospitalization, I will monitor her blood count carefully I plan to replenish iron supply with intravenous iron infusion for rapid response After blood transfusion, she should get intravenous iron infusion The patient has been receiving intravenous iron infusion through her primary care doctor's office We will contact his office to see if they can continue IV iron infusion or not If not, we will get IV iron infusion scheduled with infusion center on [address]  Once she has adequate intravenous iron replacement therapy, if she is still anemic with hemoglobin less than 11, I will prescribe darbepoetin injection to treat anemia chronic kidney disease  She likely has chronic GI bleed secondary to chronic anticoagulation therapy and presence of abnormal AVMs  AVM (arteriovenous malformation) of small bowel, acquired Her recent small bowel procedure on July 5 revealed evidence of esophagitis and angioectasias She will continue close monitoring and follow-up with GI physician I will reach out to her gastroenterologist to see if repeat EGD and  ablation is indicated given significant drop in her blood count  Paroxysmal atrial fibrillation (HCC) Due to high risk of stroke, she will remain on anticoagulation therapy for now  Tobacco use We discussed importance of nicotine  cessation Orders Placed This Encounter  Procedures   CBC with Differential (Cancer Center Only)    Standing Status:   Future    Number of Occurrences:   1    Expiration Date:   02/23/2025   Iron and Iron Binding Capacity ([facility] only)    Standing Status:   Future    Number of Occurrences:   1    Expiration Date:   02/23/2025   Ferritin    Standing Status:   Future    Number of Occurrences:   1    Expiration Date:   02/23/2025   Informed Consent Details: Physician/Practitioner Attestation; Transcribe to consent form and obtain patient signature    Standing Status:   Future    Expiration Date:   02/23/2025    Physician/Practitioner attestation of informed consent for blood and or blood product transfusion:   I, the physician/practitioner, attest that I have discussed with the patient the benefits, risks, side effects, alternatives, likelihood of achieving goals and potential problems during recovery for the procedure that I have provided informed consent.    Product(s):   All Product(s)   Care order/instruction    Transfuse Parameters    Standing Status:   Future    Expiration Date:   02/23/2025   Sample to Blood Bank    Standing Status:   Standing    Number of Occurrences:   33    Expiration Date:   02/23/2025   Type and screen  Standing Status:   Future    Number of Occurrences:   1    Expected Date:   02/24/2024    Expiration Date:   02/23/2025   Prepare RBC (crossmatch)    Standing Status:   Standing    Number of Occurrences:   1    # of Units:   2 units    Transfusion Indications:   Hemoglobin < 7 gm/dL and symptomatic    Special Requirements:   Autologous / Directed    Number of Units to Keep Ahead:   NO units ahead    If emergent  release call blood bank:   [facility] [phone]    All questions were answered. The patient knows to call the clinic with any problems, questions or concerns.  The total time spent in the appointment was 80 minutes encounter with patients including review of chart and various tests results, discussions about plan of care and coordination of care plan  Almarie Bedford, MD 10/24/20253:23 PM   CHIEF COMPLAINTS/PURPOSE OF CONSULTATION:  Anemia  HISTORY OF PRESENTING ILLNESS:  Joann Barnes 83 y.o. female is here because of anemia  She was found to have abnormal CBC from recent blood work I have the opportunity to review her CBC for the past few years At baseline on March 29, 2007, she had normal hemoglobin of 13.7 Starting in 2017, she has intermittent anemia On May 24, 2017, hemoglobin was 8.3 Since 2021, her hemoglobin has dropped to approximately 10.9 at the highest On February 03, 2021, she has severe anemia with hemoglobin of 6.8 Recently on November 03, 2023, she had low hemoglobin again at 6.0  Her most recent blood count by her primary care doctor came back 7.5 She complained of excessive fatigue and shortness of breath on minimal exertion She denies recent chest pain on exertion, pre-syncopal episodes, or palpitations. She had not noticed any recent bleeding such as epistaxis, hematuria or hematochezia.  However, she noted passage of dark stool recently The patient denies over the counter NSAID ingestion. She is not on antiplatelets agents but she is on chronic anticoagulation therapy with Xarelto  due to high risk of stroke from atrial fibrillation She had several endoscopy evaluation performed, the most recent one was on November 05, 2023 which show evidence of angioectasias and severe esophagitis She had no prior history or diagnosis of cancer. Her age appropriate screening programs are up-to-date. She denies any pica and eats a variety of diet. She never donated blood but had  received blood transfusion The patient was prescribed oral iron supplements and she takes up until her hospitalization in July.  She received blood transfusion and intravenous iron infusion  MEDICAL HISTORY:  Past Medical History:  Diagnosis Date   Aortic stenosis    Mild AS 02/2016 echo   Cancer [facility])    skin   Chronic kidney disease, stage 3b (HCC) 11/03/2023   COPD (chronic obstructive pulmonary disease) (HCC)    Dyspnea    GERD (gastroesophageal reflux disease)    Hematuria    Hernia, hiatal    HTN (hypertension)    Hyperlipidemia    Hypothyroid    Migraine    OA (osteoarthritis)    OP (osteoporosis)    Other and unspecified angina pectoris    02/2016 - had non-ischemic stress test   Paroxysmal atrial fibrillation (HCC) 11/03/2023   Pneumonia    Spondylolisthesis of lumbar region     SURGICAL HISTORY: Past Surgical History:  Procedure Laterality Date  APPENDECTOMY     CARDIOVERSION N/A 07/27/2023   Procedure: CARDIOVERSION;  Surgeon: Mona Vinie BROCKS, MD;  Location: [facility] INVASIVE CV LAB;  Service: Cardiovascular;  Laterality: N/A;   CATARACT EXTRACTION, BILATERAL     CERVICAL DISC ARTHROPLASTY     x 2   COLONOSCOPY     ENTEROSCOPY N/A 03/06/2021   Procedure: ENTEROSCOPY;  Surgeon: Rollin Dover, MD;  Location: [facility] ENDOSCOPY;  Service: Endoscopy;  Laterality: N/A;   ENTEROSCOPY N/A 11/05/2023   Procedure: ENTEROSCOPY;  Surgeon: Federico Rosario BROCKS, MD;  Location: [facility] ENDOSCOPY;  Service: Gastroenterology;  Laterality: N/A;   ESOPHAGOGASTRODUODENOSCOPY  07/19/00   HAND SURGERY Right 2014   HOT HEMOSTASIS N/A 03/06/2021   Procedure: HOT HEMOSTASIS (ARGON PLASMA COAGULATION/BICAP);  Surgeon: Rollin Dover, MD;  Location: [facility];  Service: Endoscopy;  Laterality: N/A;   HOT HEMOSTASIS N/A 11/05/2023   Procedure: EGD, WITH ARGON PLASMA COAGULATION;  Surgeon: Federico Rosario BROCKS, MD;  Location: [facility] END[facility];  Service: Gastroenterology;  Laterality: N/A;   LAMINECTOMY  05/23/2017    L4-5, L5-S1    SOCIAL HISTORY: Social History   Socioeconomic History   Marital status: Widowed    Spouse name: Not on file   Number of children: 0   Years of education: Not on file   Highest education level: Not on file  Occupational History   Occupation: retired    Associate Professor: RETIRED  Tobacco Use   Smoking status: Every Day    Current packs/day: 1.00    Average packs/day: 1 pack/day for 45.0 years (45.0 ttl pk-yrs)    Types: Cigarettes   Smokeless tobacco: Never   Tobacco comments:    0.5ppd as of 10/24/2023  Vaping Use   Vaping status: Never Used  Substance and Sexual Activity   Alcohol use: No   Drug use: No   Sexual activity: Not on file  Other Topics Concern   Not on file  Social History Narrative   12/05/18 Lives with sister   Caffeine- 2-3 cups daily   Social Drivers of Corporate investment banker Strain: Not on file  Food Insecurity: No Food Insecurity (11/04/2023)   Hunger Vital Sign    Worried About Running Out of Food in the Last Year: Never true    Ran Out of Food in the Last Year: Never true  Transportation Needs: No Transportation Needs (11/04/2023)   PRAPARE - Administrator, Civil Service (Medical): No    Lack of Transportation (Non-Medical): No  Physical Activity: Not on file  Stress: Not on file  Social Connections: Socially Isolated (11/04/2023)   Social Connection and Isolation Panel    Frequency of Communication with Friends and Family: More than three times a week    Frequency of Social Gatherings with Friends and Family: More than three times a week    Attends Religious Services: Never    Database administrator or Organizations: No    Attends Banker Meetings: Never    Marital Status: Widowed  Intimate Partner Violence: Not At Risk (11/04/2023)   Humiliation, Afraid, Rape, and Kick questionnaire    Fear of Current or Ex-Partner: No    Emotionally Abused: No    Physically Abused: No    Sexually Abused: No    FAMILY  HISTORY: Family History  Problem Relation Age of Onset   Heart disease Mother    Heart disease Father    Cancer Sister        breast   Cancer  Sister        lung   Heart disease Brother     ALLERGIES:  is allergic to bactrim [sulfamethoxazole-trimethoprim], nicotine , atorvastatin , codeine, chantix  [varenicline  tartrate], wellbutrin  [bupropion ], and zolpidem tartrate.  MEDICATIONS:  Current Outpatient Medications  Medication Sig Dispense Refill   pantoprazole  (PROTONIX ) 40 MG tablet Take 40 mg by mouth daily.     acetaminophen  (TYLENOL ) 500 MG tablet Take 500-1,000 mg by mouth as needed for mild pain (pain score 1-3) or headache.     albuterol  (ACCUNEB ) 1.25 MG/3ML nebulizer solution Take 1 ampule by nebulization 2 (two) times daily as needed for wheezing or shortness of breath.     AZO-CRANBERRY PO Take 2 tablets by mouth daily as needed (for urinary symptoms).     calcium  carbonate (TUMS - DOSED IN MG ELEMENTAL CALCIUM ) 500 MG chewable tablet Chew 1 tablet by mouth daily as needed for heartburn.     Cholecalciferol (VITAMIN D3) 2000 units TABS Take 2,000 Units by mouth daily.     escitalopram  (LEXAPRO ) 20 MG tablet Take 20 mg by mouth at bedtime.     famotidine  (PEPCID ) 40 MG tablet Take 40 mg by mouth 2 (two) times daily.     Fluticasone -Umeclidin-Vilant 100-62.5-25 MCG/INH AEPB Inhale 1 puff into the lungs daily.     furosemide  (LASIX ) 20 MG tablet Take 20-40 mg by mouth daily as needed for edema.     levothyroxine  (SYNTHROID ) 25 MCG tablet Take 25 mcg by mouth daily before breakfast.     pravastatin  (PRAVACHOL ) 80 MG tablet Take 80 mg by mouth every evening.     PROAIR  HFA 108 (90 Base) MCG/ACT inhaler Inhale 2 puffs into the lungs every 6 (six) hours as needed for wheezing or shortness of breath.     Rivaroxaban  (XARELTO ) 15 MG TABS tablet Take 1 tablet (15 mg total) by mouth daily with supper. 30 tablet 6   verapamil  (CALAN -SR) 120 MG CR tablet TAKE 1 TABLET BY MOUTH EVERY DAY 90  tablet 2   zoledronic  acid (RECLAST ) 5 MG/100ML SOLN injection Inject 5 mg into the vein once. Takes once a year     No current facility-administered medications for this visit.    REVIEW OF SYSTEMS:   Constitutional: Denies fevers, chills or abnormal night sweats Eyes: Denies blurriness of vision, double vision or watery eyes Ears, nose, mouth, throat, and face: Denies mucositis or sore throat Respiratory: Denies cough, dyspnea or wheezes Cardiovascular: Denies palpitation, chest discomfort or lower extremity swelling Gastrointestinal:  Denies nausea, heartburn or change in bowel habits Skin: Denies abnormal skin rashes Lymphatics: Denies new lymphadenopathy or easy bruising Neurological:Denies numbness, tingling or new weaknesses Behavioral/Psych: Mood is stable, no new changes  All other systems were reviewed with the patient and are negative.  PHYSICAL EXAMINATION: ECOG PERFORMANCE STATUS: 1 - Symptomatic but completely ambulatory  Vitals:   02/24/24 1403  BP: (!) 138/40  Pulse: 68  Resp: 18  Temp: 98.2 F (36.8 C)  SpO2: 100%   Filed Weights   02/24/24 1403  Weight: 123 lb 9.6 oz (56.1 kg)    GENERAL:alert, no distress and comfortable. She looks pale SKIN: skin color, texture, turgor are normal, no rashes or significant lesions EYES: normal, conjunctiva are pink and non-injected, sclera clear OROPHARYNX:no exudate, no erythema and lips, buccal mucosa, and tongue normal  NECK: supple, thyroid  normal size, non-tender, without nodularity LYMPH:  no palpable lymphadenopathy in the cervical, axillary or inguinal LUNGS: clear to auscultation and percussion with normal breathing effort  HEART: regular rate & rhythm with Ejection systolic murmurs and no lower extremity edema ABDOMEN:abdomen soft, non-tender and normal bowel sounds Musculoskeletal:no cyanosis of digits and no clubbing  PSYCH: alert & oriented x 3 with fluent speech NEURO: no focal motor/sensory deficits

## 2024-02-24 NOTE — Assessment & Plan Note (Signed)
 We discussed importance of nicotine cessation

## 2024-02-24 NOTE — Progress Notes (Signed)
 CRITICAL VALUE STICKER  CRITICAL VALUE: Hgb 6.  RECEIVER (on-site recipient of call): Erminio Louder, RN  DATE & TIME NOTIFIED: 02/24/24 at 1502  MESSENGER (representative from lab): Holley Olive   MD NOTIFIED: Dr. Lonn  TIME OF NOTIFICATION: 02/24/24 at 1503  RESPONSE:  Will order blood transfusion.

## 2024-02-24 NOTE — Assessment & Plan Note (Addendum)
 Her recent small bowel procedure on July 5 revealed evidence of esophagitis and angioectasias She will continue close monitoring and follow-up with GI physician I will reach out to her gastroenterologist to see if repeat EGD and ablation is indicated given significant drop in her blood count

## 2024-02-25 ENCOUNTER — Inpatient Hospital Stay

## 2024-02-25 DIAGNOSIS — D649 Anemia, unspecified: Secondary | ICD-10-CM

## 2024-02-25 DIAGNOSIS — D539 Nutritional anemia, unspecified: Secondary | ICD-10-CM | POA: Diagnosis not present

## 2024-02-25 DIAGNOSIS — K5520 Angiodysplasia of colon without hemorrhage: Secondary | ICD-10-CM

## 2024-02-25 MED ORDER — DIPHENHYDRAMINE HCL 25 MG PO CAPS
25.0000 mg | ORAL_CAPSULE | Freq: Once | ORAL | Status: AC
  Administered 2024-02-25: 25 mg via ORAL
  Filled 2024-02-25: qty 1

## 2024-02-25 MED ORDER — SODIUM CHLORIDE 0.9% IV SOLUTION: 250.0000 mL | INTRAVENOUS | Status: DC

## 2024-02-25 MED ORDER — ACETAMINOPHEN 325 MG PO TABS
650.0000 mg | ORAL_TABLET | Freq: Once | ORAL | Status: AC
  Administered 2024-02-25: 650 mg via ORAL
  Filled 2024-02-25: qty 2

## 2024-02-25 NOTE — Patient Instructions (Signed)

## 2024-02-27 ENCOUNTER — Telehealth: Payer: Self-pay

## 2024-02-27 ENCOUNTER — Other Ambulatory Visit: Payer: Self-pay | Admitting: Hematology and Oncology

## 2024-02-27 LAB — TYPE AND SCREEN
ABO/RH(D): A POS
Antibody Screen: NEGATIVE
Unit division: 0
Unit division: 0

## 2024-02-27 LAB — BPAM RBC
Blood Product Expiration Date: 202511152359
Blood Product Expiration Date: 202511152359
ISSUE DATE / TIME: 202510250953
ISSUE DATE / TIME: 202510250953
Unit Type and Rh: 6200
Unit Type and Rh: 6200

## 2024-02-27 NOTE — Telephone Encounter (Signed)
-----   Message from Almarie Bedford sent at 02/27/2024  9:35 AM EDT ----- Can you call and ask how she is doing? Also, she wants IV iron with her pCP office, are they able to give it to her?

## 2024-02-27 NOTE — Telephone Encounter (Signed)
 Called and left a message asking her to call the office back with how she is feeling. Called Dr. Verdia office and spoke with nurse. They are agreeable to order IV iron and will call the office back if unable to order IV iron.

## 2024-02-27 NOTE — Telephone Encounter (Signed)
 She called back and after the blood transfusion she is feeling better. She will call the office back if PCP does not schedule IV iron.

## 2024-03-01 ENCOUNTER — Other Ambulatory Visit: Payer: Self-pay | Admitting: Gastroenterology

## 2024-03-01 ENCOUNTER — Telehealth (HOSPITAL_BASED_OUTPATIENT_CLINIC_OR_DEPARTMENT_OTHER): Payer: Self-pay | Admitting: *Deleted

## 2024-03-01 DIAGNOSIS — D500 Iron deficiency anemia secondary to blood loss (chronic): Secondary | ICD-10-CM | POA: Diagnosis not present

## 2024-03-01 NOTE — Telephone Encounter (Signed)
   Pre-operative Risk Assessment    Patient Name: Joann Barnes  DOB: Jun 08, 1940 MRN: [id number]   Date of last office visit: 10/24/23 DR. LADONA Date of next office visit: NONE   Request for Surgical Clearance    Procedure:  ENTEROSCOPY  Date of Surgery:  Clearance 03/23/24                                Surgeon:  DR. HUNG Surgeon's Group or Practice Name:  [facility] Phone number:  [phone] Fax number:  [phone]   Type of Clearance Requested:   - Medical  - Pharmacy:  Hold Rivaroxaban  (Xarelto )     Type of Anesthesia:  PROPOFOL    Additional requests/questions:    Bonney Niels Jest   03/01/2024, 5:01 PM

## 2024-03-06 DIAGNOSIS — D485 Neoplasm of uncertain behavior of skin: Secondary | ICD-10-CM | POA: Diagnosis not present

## 2024-03-06 DIAGNOSIS — D1801 Hemangioma of skin and subcutaneous tissue: Secondary | ICD-10-CM | POA: Diagnosis not present

## 2024-03-06 DIAGNOSIS — L820 Inflamed seborrheic keratosis: Secondary | ICD-10-CM | POA: Diagnosis not present

## 2024-03-06 DIAGNOSIS — Z08 Encounter for follow-up examination after completed treatment for malignant neoplasm: Secondary | ICD-10-CM | POA: Diagnosis not present

## 2024-03-06 DIAGNOSIS — D0461 Carcinoma in situ of skin of right upper limb, including shoulder: Secondary | ICD-10-CM | POA: Diagnosis not present

## 2024-03-06 DIAGNOSIS — Z85828 Personal history of other malignant neoplasm of skin: Secondary | ICD-10-CM | POA: Diagnosis not present

## 2024-03-06 DIAGNOSIS — L814 Other melanin hyperpigmentation: Secondary | ICD-10-CM | POA: Diagnosis not present

## 2024-03-06 DIAGNOSIS — L570 Actinic keratosis: Secondary | ICD-10-CM | POA: Diagnosis not present

## 2024-03-06 DIAGNOSIS — L821 Other seborrheic keratosis: Secondary | ICD-10-CM | POA: Diagnosis not present

## 2024-03-06 DIAGNOSIS — L538 Other specified erythematous conditions: Secondary | ICD-10-CM | POA: Diagnosis not present

## 2024-03-06 DIAGNOSIS — R208 Other disturbances of skin sensation: Secondary | ICD-10-CM | POA: Diagnosis not present

## 2024-03-06 DIAGNOSIS — L905 Scar conditions and fibrosis of skin: Secondary | ICD-10-CM | POA: Diagnosis not present

## 2024-03-09 DIAGNOSIS — D500 Iron deficiency anemia secondary to blood loss (chronic): Secondary | ICD-10-CM | POA: Diagnosis not present

## 2024-03-12 ENCOUNTER — Inpatient Hospital Stay: Admitting: Hematology and Oncology

## 2024-03-12 ENCOUNTER — Encounter: Payer: Self-pay | Admitting: Hematology and Oncology

## 2024-03-12 ENCOUNTER — Inpatient Hospital Stay

## 2024-03-12 ENCOUNTER — Inpatient Hospital Stay: Attending: Hematology and Oncology

## 2024-03-12 ENCOUNTER — Telehealth: Payer: Self-pay

## 2024-03-12 VITALS — BP 122/65 | HR 83 | Resp 18 | Ht 63.0 in | Wt 121.8 lb

## 2024-03-12 DIAGNOSIS — D539 Nutritional anemia, unspecified: Secondary | ICD-10-CM

## 2024-03-12 DIAGNOSIS — N189 Chronic kidney disease, unspecified: Secondary | ICD-10-CM | POA: Insufficient documentation

## 2024-03-12 DIAGNOSIS — K921 Melena: Secondary | ICD-10-CM | POA: Diagnosis not present

## 2024-03-12 DIAGNOSIS — Z7901 Long term (current) use of anticoagulants: Secondary | ICD-10-CM | POA: Diagnosis not present

## 2024-03-12 DIAGNOSIS — D649 Anemia, unspecified: Secondary | ICD-10-CM

## 2024-03-12 LAB — CBC WITH DIFFERENTIAL/PLATELET
Abs Immature Granulocytes: 0.04 K/uL (ref 0.00–0.07)
Basophils Absolute: 0.1 K/uL (ref 0.0–0.1)
Basophils Relative: 1 %
Eosinophils Absolute: 0.2 K/uL (ref 0.0–0.5)
Eosinophils Relative: 2 %
HCT: 25.9 % — ABNORMAL LOW (ref 36.0–46.0)
Hemoglobin: 8 g/dL — ABNORMAL LOW (ref 12.0–15.0)
Immature Granulocytes: 1 %
Lymphocytes Relative: 10 %
Lymphs Abs: 0.7 K/uL (ref 0.7–4.0)
MCH: 28.2 pg (ref 26.0–34.0)
MCHC: 30.9 g/dL (ref 30.0–36.0)
MCV: 91.2 fL (ref 80.0–100.0)
Monocytes Absolute: 0.7 K/uL (ref 0.1–1.0)
Monocytes Relative: 10 %
Neutro Abs: 5.3 K/uL (ref 1.7–7.7)
Neutrophils Relative %: 76 %
Platelets: 302 K/uL (ref 150–400)
RBC: 2.84 MIL/uL — ABNORMAL LOW (ref 3.87–5.11)
RDW: 25.4 % — ABNORMAL HIGH (ref 11.5–15.5)
WBC: 6.9 K/uL (ref 4.0–10.5)
nRBC: 0 % (ref 0.0–0.2)

## 2024-03-12 LAB — SAMPLE TO BLOOD BANK

## 2024-03-12 LAB — PREPARE RBC (CROSSMATCH)

## 2024-03-12 MED ORDER — DIPHENHYDRAMINE HCL 25 MG PO CAPS
25.0000 mg | ORAL_CAPSULE | Freq: Once | ORAL | Status: AC
  Administered 2024-03-12: 25 mg via ORAL
  Filled 2024-03-12: qty 1

## 2024-03-12 MED ORDER — ACETAMINOPHEN 325 MG PO TABS
650.0000 mg | ORAL_TABLET | Freq: Once | ORAL | Status: AC
  Administered 2024-03-12: 650 mg via ORAL
  Filled 2024-03-12: qty 2

## 2024-03-12 MED ORDER — SODIUM CHLORIDE 0.9% IV SOLUTION
250.0000 mL | INTRAVENOUS | Status: DC
  Administered 2024-03-12: 100 mL via INTRAVENOUS

## 2024-03-12 MED ORDER — SODIUM CHLORIDE 0.9% FLUSH: 3.0000 mL | INTRAVENOUS | Status: DC | PRN

## 2024-03-12 NOTE — Telephone Encounter (Signed)
 Called Dr. Verdia and spoke with office staff, Alfonse needs more IV iron due to bleeding/ getting blood transfusion. Left office # for call back. Faxed office note [phone], received fax confirmation.

## 2024-03-12 NOTE — Telephone Encounter (Signed)
   Name: Joann Barnes  DOB: [birth year]  MRN: [id number]  Primary Cardiologist: Gordy Bergamo, MD   Preoperative team, please contact this patient and set up a phone call appointment for further preoperative risk assessment. Please obtain consent and complete medication review. Thank you for your help.  I confirm that guidance regarding antiplatelet and oral anticoagulation therapy has been completed and, if necessary, noted below.  Per Pharm D, patient has not had an Afib/aflutter ablation within the last 3 months, DCCV within the last 4 weeks, or Watchman in the last 45 days. Patient may hold Xarelto  for 3 days prior to procedure.    I also confirmed the patient resides in the state of [place] . As per [facility] Medical Board telemedicine laws, the patient must reside in the state in which the provider is licensed.    [name], NP 03/12/2024, 4:33 PM [facility]

## 2024-03-12 NOTE — Telephone Encounter (Signed)
 Dr. Verdia office called back and is aware for the need for more IV iron.

## 2024-03-12 NOTE — Telephone Encounter (Signed)
 Patient with diagnosis of afib on Xarelto  for anticoagulation.    Procedure: ENTEROSCOPY  Date of procedure: 03/23/24   CHA2DS2-VASc Score = 5   This indicates a 7.2% annual risk of stroke. The patient's score is based upon: CHF History: 0 HTN History: 1 Diabetes History: 0 Stroke History: 0 Vascular Disease History: 1 Age Score: 2 Gender Score: 1      CrCl 24.6 ml/min Platelet count 302  Patient has not had an Afib/aflutter ablation in the last 3 months, DCCV within the last 4 weeks or a watchman implanted in the last 45 days   Per office protocol, patient can hold Xarelto  for 3 days prior to procedure.    **This guidance is not considered finalized until pre-operative APP has relayed final recommendations.**

## 2024-03-12 NOTE — Telephone Encounter (Signed)
   Name: Joann Barnes  DOB: 11/15/1940  MRN: [id number]  Primary Cardiologist: Gordy Bergamo, MD   Preoperative team, please contact this patient and set up a phone call appointment for further preoperative risk assessment. Please obtain consent and complete medication review. Thank you for your help.  I confirm that guidance regarding antiplatelet and oral anticoagulation therapy has been completed and, if necessary, noted below.  Patient has not had an Afib/aflutter ablation in the last 3 months, DCCV within the last 4 weeks or a watchman implanted in the last 45 days    Per office protocol, patient can hold Xarelto  for 3 days prior to procedure.   I also confirmed the patient resides in the state of  . As per [facility] Medical Board telemedicine laws, the patient must reside in the state in which the provider is licensed.   Kemonte Ullman E Bertram Haddix, NP 03/12/2024, 4:33 PM [facility]

## 2024-03-12 NOTE — Assessment & Plan Note (Addendum)
 The cause of her anemia is multifactorial, likely combination of iron deficiency and anemia of chronic kidney disease She was recent leg given 2 units of blood 2 weeks ago and she had received 2 doses of intravenous iron through her primary care doctor She is still having active bleeding with ongoing passage of dark stool Her blood count today is low at 8  We discussed some of the risks, benefits, and alternatives of blood transfusions. The patient is symptomatic from anemia and the hemoglobin level is critically low.  Some of the side-effects to be expected including risks of transfusion reactions, chills, infection, syndrome of volume overload and risk of hospitalization from various reasons and the patient is willing to proceed and went ahead to sign consent today. I will order 1 unit of blood today  Given recent hospitalization, I will monitor her blood count carefully I will get her to return in 2 weeks for possible further blood transfusion support I recommend she contact her primary care doctor for more intravenous iron infusion  Once she has adequate intravenous iron replacement therapy, if she is still anemic with hemoglobin less than 11, I will prescribe darbepoetin injection to treat anemia chronic kidney disease  She likely has chronic GI bleed secondary to chronic anticoagulation therapy and presence of abnormal AVMs.  Repeat EGD evaluation is pending end of next week

## 2024-03-12 NOTE — Telephone Encounter (Signed)
 Please advise holding Xarelto  prior to enteroscopy on 11/21.  Last labs November 2025.   Thank you!  DW

## 2024-03-12 NOTE — Progress Notes (Signed)
 [facility] OFFICE PROGRESS NOTE  Verdia Lombard, MD  ASSESSMENT & PLAN:  Assessment & Plan Deficiency anemia The cause of her anemia is multifactorial, likely combination of iron deficiency and anemia of chronic kidney disease She was recent leg given 2 units of blood 2 weeks ago and she had received 2 doses of intravenous iron through her primary care doctor She is still having active bleeding with ongoing passage of dark stool Her blood count today is low at 8  We discussed some of the risks, benefits, and alternatives of blood transfusions. The patient is symptomatic from anemia and the hemoglobin level is critically low.  Some of the side-effects to be expected including risks of transfusion reactions, chills, infection, syndrome of volume overload and risk of hospitalization from various reasons and the patient is willing to proceed and went ahead to sign consent today. I will order 1 unit of blood today  Given recent hospitalization, I will monitor her blood count carefully I will get her to return in 2 weeks for possible further blood transfusion support I recommend she contact her primary care doctor for more intravenous iron infusion  Once she has adequate intravenous iron replacement therapy, if she is still anemic with hemoglobin less than 11, I will prescribe darbepoetin injection to treat anemia chronic kidney disease  She likely has chronic GI bleed secondary to chronic anticoagulation therapy and presence of abnormal AVMs.  Repeat EGD evaluation is pending end of next week    Orders Placed This Encounter  Procedures   Informed Consent Details: Physician/Practitioner Attestation; Transcribe to consent form and obtain patient signature    Standing Status:   Future    Expiration Date:   03/12/2025    Physician/Practitioner attestation of informed consent for blood and or blood product transfusion:   I, the physician/practitioner, attest that I have  discussed with the patient the benefits, risks, side effects, alternatives, likelihood of achieving goals and potential problems during recovery for the procedure that I have provided informed consent.    Product(s):   All Product(s)   Care order/instruction    Transfuse Parameters    Standing Status:   Future    Expiration Date:   03/12/2025   Type and screen         Standing Status:   Future    Number of Occurrences:   1    Expiration Date:   03/12/2025   Prepare RBC (crossmatch)    Standing Status:   Standing    Number of Occurrences:   1    # of Units:   1 unit    Transfusion Indications:   Hemoglobin 8 gm/dL or less and orthopedic or cardiac surgery or pre-existing cardiac condition    Number of Units to Keep Ahead:   NO units ahead    Instructions::   Transfuse    If emergent release call blood bank:   Not emergent release    INTERVAL HISTORY: Patient returns for recurrent anemia Symptoms of anemia includes dyspnea and pallor She complained of feeling tired and out of breath She noticed ongoing passage of dark stool We reviewed CBC result  SUMMARY OF HEMATOLOGIC HISTORY:  She was found to have abnormal CBC from recent blood work I have the opportunity to review her CBC for the past few years At baseline on March 29, 2007, she had normal hemoglobin of 13.7 Starting in 2017, she has intermittent anemia On May 24, 2017, hemoglobin was 8.3 Since 2021,  her hemoglobin has dropped to approximately 10.9 at the highest On February 03, 2021, she has severe anemia with hemoglobin of 6.8 Recently on November 03, 2023, she had low hemoglobin again at 6.0  Her most recent blood count by her primary care doctor came back 7.5 She complained of excessive fatigue and shortness of breath on minimal exertion She denies recent chest pain on exertion, pre-syncopal episodes, or palpitations. She had not noticed any recent bleeding such as epistaxis, hematuria or hematochezia.  However, she  noted passage of dark stool recently The patient denies over the counter NSAID ingestion. She is not on antiplatelets agents but she is on chronic anticoagulation therapy with Xarelto  due to high risk of stroke from atrial fibrillation She had several endoscopy evaluation performed, the most recent one was on November 05, 2023 which show evidence of angioectasias and severe esophagitis She had no prior history or diagnosis of cancer. Her age appropriate screening programs are up-to-date. She denies any pica and eats a variety of diet. She never donated blood but had received blood transfusion The patient was prescribed oral iron supplements and she takes up until her hospitalization in July.  She received blood transfusion and intravenous iron infusion  Lab Results  Component Value Date   VITAMINB12 136 (L) 02/05/2021   FERRITIN 14 02/24/2024   HGB 8.0 (L) 03/12/2024   RBC 2.84 (L) 03/12/2024   Vitals:   03/12/24 1205  BP: 122/65  Pulse: 83  Resp: 18  SpO2: 95%

## 2024-03-13 ENCOUNTER — Telehealth: Payer: Self-pay

## 2024-03-13 LAB — BPAM RBC
Blood Product Expiration Date: 202512022359
ISSUE DATE / TIME: 202511101338
Unit Type and Rh: 6200

## 2024-03-13 LAB — TYPE AND SCREEN
ABO/RH(D): A POS
Antibody Screen: NEGATIVE
Unit division: 0

## 2024-03-13 NOTE — Telephone Encounter (Signed)
 Med Rec and Consent done     Patient Consent for Virtual Visit        Joann Barnes has provided verbal consent on 03/13/2024 for a virtual visit (video or telephone).   CONSENT FOR VIRTUAL VISIT FOR:  Joann Barnes  By participating in this virtual visit I agree to the following:  I hereby voluntarily request, consent and authorize [facility] and its employed or contracted physicians, physician assistants, nurse practitioners or other licensed health care professionals (the Practitioner), to provide me with telemedicine health care services (the "Services) as deemed necessary by the treating Practitioner. I acknowledge and consent to receive the Services by the Practitioner via telemedicine. I understand that the telemedicine visit will involve communicating with the Practitioner through live audiovisual communication technology and the disclosure of certain medical information by electronic transmission. I acknowledge that I have been given the opportunity to request an in-person assessment or other available alternative prior to the telemedicine visit and am voluntarily participating in the telemedicine visit.  I understand that I have the right to withhold or withdraw my consent to the use of telemedicine in the course of my care at any time, without affecting my right to future care or treatment, and that the Practitioner or I may terminate the telemedicine visit at any time. I understand that I have the right to inspect all information obtained and/or recorded in the course of the telemedicine visit and may receive copies of available information for a reasonable fee.  I understand that some of the potential risks of receiving the Services via telemedicine include:  Delay or interruption in medical evaluation due to technological equipment failure or disruption; Information transmitted may not be sufficient (e.g. poor resolution of images) to allow for appropriate medical decision  making by the Practitioner; and/or  In rare instances, security protocols could fail, causing a breach of personal health information.  Furthermore, I acknowledge that it is my responsibility to provide information about my medical history, conditions and care that is complete and accurate to the best of my ability. I acknowledge that Practitioner's advice, recommendations, and/or decision may be based on factors not within their control, such as incomplete or inaccurate data provided by me or distortions of diagnostic images or specimens that may result from electronic transmissions. I understand that the practice of medicine is not an exact science and that Practitioner makes no warranties or guarantees regarding treatment outcomes. I acknowledge that a copy of this consent can be made available to me via my patient portal [facility] MyChart), or I can request a printed copy by calling the office of [facility].    I understand that my insurance will be billed for this visit.   I have read or had this consent read to me. I understand the contents of this consent, which adequately explains the benefits and risks of the Services being provided via telemedicine.  I have been provided ample opportunity to ask questions regarding this consent and the Services and have had my questions answered to my satisfaction. I give my informed consent for the services to be provided through the use of telemedicine in my medical care

## 2024-03-13 NOTE — Telephone Encounter (Signed)
 S/W pt and scheduled TELE preop appt 03/19/24. Med Rec and Consent done   Will update the surgeons office.

## 2024-03-14 DIAGNOSIS — M150 Primary generalized (osteo)arthritis: Secondary | ICD-10-CM | POA: Diagnosis not present

## 2024-03-14 DIAGNOSIS — K52831 Collagenous colitis: Secondary | ICD-10-CM | POA: Diagnosis not present

## 2024-03-14 DIAGNOSIS — E039 Hypothyroidism, unspecified: Secondary | ICD-10-CM | POA: Diagnosis not present

## 2024-03-14 DIAGNOSIS — M810 Age-related osteoporosis without current pathological fracture: Secondary | ICD-10-CM | POA: Diagnosis not present

## 2024-03-14 DIAGNOSIS — J432 Centrilobular emphysema: Secondary | ICD-10-CM | POA: Diagnosis not present

## 2024-03-14 DIAGNOSIS — E538 Deficiency of other specified B group vitamins: Secondary | ICD-10-CM | POA: Diagnosis not present

## 2024-03-14 DIAGNOSIS — I6523 Occlusion and stenosis of bilateral carotid arteries: Secondary | ICD-10-CM | POA: Diagnosis not present

## 2024-03-14 DIAGNOSIS — I700 Atherosclerosis of aorta: Secondary | ICD-10-CM | POA: Diagnosis not present

## 2024-03-14 DIAGNOSIS — D500 Iron deficiency anemia secondary to blood loss (chronic): Secondary | ICD-10-CM | POA: Diagnosis not present

## 2024-03-14 DIAGNOSIS — D692 Other nonthrombocytopenic purpura: Secondary | ICD-10-CM | POA: Diagnosis not present

## 2024-03-14 DIAGNOSIS — N1832 Chronic kidney disease, stage 3b: Secondary | ICD-10-CM | POA: Diagnosis not present

## 2024-03-14 DIAGNOSIS — E782 Mixed hyperlipidemia: Secondary | ICD-10-CM | POA: Diagnosis not present

## 2024-03-16 ENCOUNTER — Encounter (HOSPITAL_COMMUNITY): Payer: Self-pay | Admitting: Gastroenterology

## 2024-03-19 ENCOUNTER — Ambulatory Visit: Attending: Cardiovascular Disease

## 2024-03-19 DIAGNOSIS — Z01810 Encounter for preprocedural cardiovascular examination: Secondary | ICD-10-CM

## 2024-03-19 NOTE — Progress Notes (Signed)
 Virtual Visit via Telephone Note   Because of Joann Barnes co-morbid illnesses, she is at least at moderate risk for complications without adequate follow up.  This format is felt to be most appropriate for this patient at this time.  Due to technical limitations with video connection (technology), today's appointment will be conducted as an audio only telehealth visit, and Joann Barnes verbally agreed to proceed in this manner.   All issues noted in this document were discussed and addressed.  No physical exam could be performed with this format.  Evaluation Performed:  Preoperative cardiovascular risk assessment _____________   Date:  03/19/2024   Patient ID:  Joann Barnes, DOB [birth year], MRN [id number] Patient Location:  Home Provider location:   Office  Primary Care Provider:  Verdia Lombard, MD Primary Cardiologist:  Gordy Bergamo, MD  Chief Complaint / Patient Profile   83 y.o. y/o female with a h/o paroxysmal atrial fibrillation, dizziness, HTN who is pending endoscopy procedure and presents today for telephonic preoperative cardiovascular risk assessment.  History of Present Illness    Joann Barnes is a 83 y.o. female who presents via audio/video conferencing for a telehealth visit today.  Pt was last seen in cardiology clinic on 10/24/2023 by Dr. Bergamo.  At that time Joann Barnes was doing well .  The patient is now pending procedure as outlined above. Since her last visit, she continues to be stable from a cardiac standpoint.  Today she denies chest pain, shortness of breath,  fatigue, palpitations, hematuria, hemoptysis, diaphoresis, weakness, presyncope, syncope, orthopnea, and PND.   Past Medical History    Past Medical History:  Diagnosis Date   Aortic stenosis    Mild AS 02/2016 echo   Cancer [facility])    skin   Chronic kidney disease, stage 3b (HCC) 11/03/2023   COPD (chronic obstructive pulmonary disease) (HCC)    Dyspnea    GERD (gastroesophageal reflux  disease)    Hematuria    Hernia, hiatal    HTN (hypertension)    Hyperlipidemia    Hypothyroid    Migraine    OA (osteoarthritis)    OP (osteoporosis)    Other and unspecified angina pectoris    02/2016 - had non-ischemic stress test   Paroxysmal atrial fibrillation (HCC) 11/03/2023   Pneumonia    Spondylolisthesis of lumbar region    Past Surgical History:  Procedure Laterality Date   APPENDECTOMY     CARDIOVERSION N/A 07/27/2023   Procedure: CARDIOVERSION;  Surgeon: Mona Vinie BROCKS, MD;  [facility]: [facility] INVASIVE CV LAB;  Service: Cardiovascular;  Laterality: N/A;   CATARACT EXTRACTION, BILATERAL     CERVICAL DISC ARTHROPLASTY     x 2   COLONOSCOPY     ENTEROSCOPY N/A 03/06/2021   Procedure: ENTEROSCOPY;  Surgeon: Rollin Dover, MD;  Location: [facility] ENDOSCOPY;  Service: Endoscopy;  Laterality: N/A;   ENTEROSCOPY N/A 11/05/2023   Procedure: ENTEROSCOPY;  Surgeon: Federico Rosario BROCKS, MD;  Location: [facility] ENDOSCOPY;  Service: Gastroenterology;  Laterality: N/A;   ESOPHAGOGASTRODUODENOSCOPY  07/19/00   HAND SURGERY Right 2014   HOT HEMOSTASIS N/A 03/06/2021   Procedure: HOT HEMOSTASIS (ARGON PLASMA COAGULATION/BICAP);  Surgeon: Rollin Dover, MD;  Location: [facility];  Service: Endoscopy;  Laterality: N/A;   HOT HEMOSTASIS N/A 11/05/2023   Procedure: EGD, WITH ARGON PLASMA COAGULATION;  Surgeon: Federico Rosario BROCKS, MD;  Location: [facility] ENDOSCOPY;  Service: Gastroenterology;  Laterality: N/A;   LAMINECTOMY  05/23/2017   L4-5, L5-S1  Allergies  Allergies  Allergen Reactions   Bactrim [Sulfamethoxazole-Trimethoprim] Shortness Of Breath   Nicotine  Anaphylaxis, Itching, Rash and Other (See Comments)    Rash and itch with patch locally; Chewing the gum closed her throat   Atorvastatin  Other (See Comments)    Muscle aches and leg edema   Codeine Itching and Nausea And Vomiting   Chantix  [Varenicline  Tartrate] Hives, Itching, Nausea And Vomiting, Rash and Other (See Comments)    Mental status changes    Wellbutrin  [Bupropion ] Rash and Other (See Comments)    Slight rash around ankles   Zolpidem Tartrate Other (See Comments)    Bad dreams    Home Medications    Prior to Admission medications   Medication Sig Start Date End Date Taking? Authorizing Provider  acetaminophen  (TYLENOL ) 500 MG tablet Take 500-1,000 mg by mouth as needed for mild pain (pain score 1-3) or headache.    [provider]  albuterol  (ACCUNEB ) 1.25 MG/3ML nebulizer solution Take 1 ampule by nebulization 2 (two) times daily as needed for wheezing or shortness of breath.    [provider]  AZO-CRANBERRY PO Take 2 tablets by mouth daily as needed (for urinary symptoms).    [provider]  calcium  carbonate (TUMS - DOSED IN MG ELEMENTAL CALCIUM ) 500 MG chewable tablet Chew 1 tablet by mouth daily as needed for heartburn.    [provider]  Cholecalciferol (VITAMIN D3) 2000 units TABS Take 2,000 Units by mouth daily.    [provider]  escitalopram  (LEXAPRO ) 20 MG tablet Take 20 mg by mouth at bedtime. 08/15/17   [provider]  famotidine  (PEPCID ) 40 MG tablet Take 40 mg by mouth 2 (two) times daily. 03/31/21   [provider]  Fluticasone -Umeclidin-Vilant 100-62.5-25 MCG/INH AEPB Inhale 1 puff into the lungs daily.    [provider]  furosemide  (LASIX ) 20 MG tablet Take 20-40 mg by mouth daily as needed for edema.    [provider]  levothyroxine  (SYNTHROID ) 25 MCG tablet Take 25 mcg by mouth daily before breakfast.    [provider]  pantoprazole  (PROTONIX ) 40 MG tablet Take 40 mg by mouth daily. 11/29/23   [provider]  pravastatin  (PRAVACHOL ) 80 MG tablet Take 80 mg by mouth every evening.    [provider]  PROAIR  HFA 108 (90 Base) MCG/ACT inhaler Inhale 2 puffs into the lungs every 6 (six) hours as needed for wheezing or shortness of breath. 08/25/17   [provider]  Rivaroxaban  (XARELTO ) 15  MG TABS tablet Take 1 tablet (15 mg total) by mouth daily with supper. 10/24/23   Ladona Heinz, MD  verapamil  (CALAN -SR) 120 MG CR tablet TAKE 1 TABLET BY MOUTH EVERY DAY 01/27/24   Ladona Heinz, MD  zoledronic  acid (RECLAST ) 5 MG/100ML SOLN injection Inject 5 mg into the vein once. Takes once a year    [provider]    Physical Exam    Vital Signs:  Joann Barnes does not have vital signs available for review today.  Given telephonic nature of communication, physical exam is limited. AAOx3. NAD. Normal affect.  Speech and respirations are unlabored.  Accessory Clinical Findings    None  Assessment & Plan    1.  Preoperative Cardiovascular Risk Assessment: ENTEROSCOPY   Date of Surgery:  Clearance 03/23/24  Surgeon:  DR. HUNG Surgeon's Group or Practice Name:  [facility] Phone number:  [phone] Fax number:  [phone]    Primary Cardiologist: Gordy Bergamo, MD  Chart reviewed as part of pre-operative protocol coverage. Given past medical history and time since last visit, based on ACC/AHA guidelines, Joann Barnes would be at acceptable risk for the planned procedure without further cardiovascular testing.   Patient was advised that if she develops new symptoms prior to surgery to contact our office to arrange a follow-up appointment.  He verbalized understanding.  Her Xarelto  may be held for 3 days prior to her procedure.  Please resume as soon as hemostasis is achieved.  I will route this recommendation to the requesting party via Epic fax function and remove from pre-op pool.       Time:   Today, I have spent 7 minutes with the patient with telehealth technology discussing medical history, symptoms, and management plan.  I spent 10 minutes reviewing patient's past cardiac history and cardiac medications.    Joann CHRISTELLA Beauvais, NP  03/19/2024, 8:00 AM

## 2024-03-21 DIAGNOSIS — N1832 Chronic kidney disease, stage 3b: Secondary | ICD-10-CM | POA: Diagnosis not present

## 2024-03-21 DIAGNOSIS — Z0000 Encounter for general adult medical examination without abnormal findings: Secondary | ICD-10-CM | POA: Diagnosis not present

## 2024-03-21 DIAGNOSIS — D500 Iron deficiency anemia secondary to blood loss (chronic): Secondary | ICD-10-CM | POA: Diagnosis not present

## 2024-03-21 DIAGNOSIS — M810 Age-related osteoporosis without current pathological fracture: Secondary | ICD-10-CM | POA: Diagnosis not present

## 2024-03-21 DIAGNOSIS — E538 Deficiency of other specified B group vitamins: Secondary | ICD-10-CM | POA: Diagnosis not present

## 2024-03-21 DIAGNOSIS — J432 Centrilobular emphysema: Secondary | ICD-10-CM | POA: Diagnosis not present

## 2024-03-21 DIAGNOSIS — E039 Hypothyroidism, unspecified: Secondary | ICD-10-CM | POA: Diagnosis not present

## 2024-03-21 DIAGNOSIS — E782 Mixed hyperlipidemia: Secondary | ICD-10-CM | POA: Diagnosis not present

## 2024-03-21 DIAGNOSIS — I700 Atherosclerosis of aorta: Secondary | ICD-10-CM | POA: Diagnosis not present

## 2024-03-21 DIAGNOSIS — Z23 Encounter for immunization: Secondary | ICD-10-CM | POA: Diagnosis not present

## 2024-03-21 DIAGNOSIS — I6523 Occlusion and stenosis of bilateral carotid arteries: Secondary | ICD-10-CM | POA: Diagnosis not present

## 2024-03-21 DIAGNOSIS — D692 Other nonthrombocytopenic purpura: Secondary | ICD-10-CM | POA: Diagnosis not present

## 2024-03-23 ENCOUNTER — Other Ambulatory Visit: Payer: Self-pay

## 2024-03-23 ENCOUNTER — Encounter (HOSPITAL_COMMUNITY): Admission: RE | Disposition: A | Payer: Self-pay | Source: Home / Self Care | Attending: Gastroenterology

## 2024-03-23 ENCOUNTER — Ambulatory Visit (HOSPITAL_COMMUNITY): Admitting: Certified Registered"

## 2024-03-23 ENCOUNTER — Ambulatory Visit (HOSPITAL_COMMUNITY)
Admission: RE | Admit: 2024-03-23 | Discharge: 2024-03-23 | Disposition: A | Discharge status: Home / Self Care | Attending: Gastroenterology | Admitting: Gastroenterology

## 2024-03-23 ENCOUNTER — Encounter (HOSPITAL_COMMUNITY): Payer: Self-pay | Admitting: Gastroenterology

## 2024-03-23 DIAGNOSIS — K2100 Gastro-esophageal reflux disease with esophagitis, without bleeding: Secondary | ICD-10-CM | POA: Insufficient documentation

## 2024-03-23 DIAGNOSIS — E039 Hypothyroidism, unspecified: Secondary | ICD-10-CM | POA: Diagnosis not present

## 2024-03-23 DIAGNOSIS — K449 Diaphragmatic hernia without obstruction or gangrene: Secondary | ICD-10-CM | POA: Diagnosis not present

## 2024-03-23 DIAGNOSIS — I129 Hypertensive chronic kidney disease with stage 1 through stage 4 chronic kidney disease, or unspecified chronic kidney disease: Secondary | ICD-10-CM | POA: Diagnosis not present

## 2024-03-23 DIAGNOSIS — K5520 Angiodysplasia of colon without hemorrhage: Secondary | ICD-10-CM | POA: Diagnosis not present

## 2024-03-23 DIAGNOSIS — I10 Essential (primary) hypertension: Secondary | ICD-10-CM

## 2024-03-23 DIAGNOSIS — K31819 Angiodysplasia of stomach and duodenum without bleeding: Secondary | ICD-10-CM

## 2024-03-23 DIAGNOSIS — I480 Paroxysmal atrial fibrillation: Secondary | ICD-10-CM | POA: Diagnosis not present

## 2024-03-23 DIAGNOSIS — D509 Iron deficiency anemia, unspecified: Secondary | ICD-10-CM | POA: Insufficient documentation

## 2024-03-23 DIAGNOSIS — Z7901 Long term (current) use of anticoagulants: Secondary | ICD-10-CM | POA: Insufficient documentation

## 2024-03-23 DIAGNOSIS — D500 Iron deficiency anemia secondary to blood loss (chronic): Secondary | ICD-10-CM | POA: Diagnosis present

## 2024-03-23 DIAGNOSIS — F17210 Nicotine dependence, cigarettes, uncomplicated: Secondary | ICD-10-CM

## 2024-03-23 DIAGNOSIS — K222 Esophageal obstruction: Secondary | ICD-10-CM | POA: Insufficient documentation

## 2024-03-23 DIAGNOSIS — J449 Chronic obstructive pulmonary disease, unspecified: Secondary | ICD-10-CM | POA: Diagnosis not present

## 2024-03-23 DIAGNOSIS — N1832 Chronic kidney disease, stage 3b: Secondary | ICD-10-CM | POA: Insufficient documentation

## 2024-03-23 DIAGNOSIS — I4819 Other persistent atrial fibrillation: Secondary | ICD-10-CM | POA: Diagnosis not present

## 2024-03-23 HISTORY — PX: ENTEROSCOPY: SHX5533

## 2024-03-23 SURGERY — ENTEROSCOPY
Anesthesia: Monitor Anesthesia Care

## 2024-03-23 MED ORDER — SODIUM CHLORIDE 0.9 % IV SOLN: INTRAVENOUS | Status: DC

## 2024-03-23 MED ORDER — LIDOCAINE 2% (20 MG/ML) 5 ML SYRINGE
INTRAMUSCULAR | Status: DC | PRN
  Administered 2024-03-23: 60 mg via INTRAVENOUS

## 2024-03-23 MED ORDER — PROPOFOL 500 MG/50ML IV EMUL
INTRAVENOUS | Status: AC
Start: 2024-03-23 — End: 2024-03-23
  Filled 2024-03-23: qty 50

## 2024-03-23 MED ORDER — SODIUM CHLORIDE 0.9 % IV SOLN
INTRAVENOUS | Status: AC | PRN
  Administered 2024-03-23: 500 mL via INTRAMUSCULAR

## 2024-03-23 MED ORDER — PROPOFOL 500 MG/50ML IV EMUL
INTRAVENOUS | Status: DC | PRN
  Administered 2024-03-23: 150 ug/kg/min via INTRAVENOUS
  Administered 2024-03-23: 10 mg via INTRAVENOUS
  Administered 2024-03-23: 30 mg via INTRAVENOUS

## 2024-03-23 MED ORDER — SPOT INK MARKER SYRINGE KIT
PACK | SUBMUCOSAL | Status: DC | PRN
  Administered 2024-03-23: 2 mL via SUBMUCOSAL

## 2024-03-23 MED ORDER — GLUCAGON HCL RDNA (DIAGNOSTIC) 1 MG IJ SOLR
INTRAMUSCULAR | Status: DC | PRN
  Administered 2024-03-23: .5 mg via INTRAVENOUS

## 2024-03-23 NOTE — H&P (Signed)
 Joann Barnes HPI: This 83 year old white female presents to the office for a hospital follow up. She had labs done by her Dr. Lynette her nephrologist on 11/01/2022 and was advised that she has a hemoglobin 6.5 gm/dl when she was ordered to have a blood transfusion. She was seen at [facility] ER and was given 2 units of PRBC's. She was noted to be guaiac positive at that time. She had a small bowel enteroscopy done 11/05/2023 by Dr. Rosario Barnes that revealed LA grade A esophagitis with no bleeding, a small hiatal hernia, four non-bleeding angioectasis in the duodenum; treated with argon plasma coagulation and two non bleeding angioectasias in the jejunum also treated with APC. She signed herself out the hospital after the procedure. She was seen by her PCP, Dr. Verdia and was given iron supplements for IDA. She had 2 iron infusions on 11/17/2023 and 11/24/2023. She has discontinued the oral iron supplements since then. At her baseline, she has 1 BM every 3 days. There is obvious blood or mucus in the stool. She has dull periumbilical pain usually post-prandially usually lasting 30 minutes. The pain does not radiate and the severity has reached 5/10 at its worst. She takes Famotidine  40 mg BID for acid reflux with good control. She has a poor appetite. She has lost 16 pounds over the last 10 months. She denies having any complaints of nausea, vomiting, dysphagia or odynophagia. She denies having a family history of colon cancer, celiac sprue or IBD. She had a colonoscopy done on 02/18/2021 when tubular adenomas were removed. She had an enteroscopy done on 03/06/2021 that revealed a 4 cm hiatal hernia, gastritis, multiple bleeding angiodysplastic lesion in the duodenum and multiple non-bleeding angiodysplastic lesions in the jejunum; these were all treated with a monoplolar probe.   Past Medical History:  Diagnosis Date   Aortic stenosis    Mild AS 02/2016 echo   Cancer [facility])    skin   Chronic Barnes disease, stage  3b (HCC) 11/03/2023   COPD (chronic obstructive pulmonary disease) (HCC)    Dyspnea    GERD (gastroesophageal reflux disease)    Hematuria    Hernia, hiatal    HTN (hypertension)    Hyperlipidemia    Hypothyroid    Migraine    OA (osteoarthritis)    OP (osteoporosis)    Other and unspecified angina pectoris    02/2016 - had non-ischemic stress test   Paroxysmal atrial fibrillation (HCC) 11/03/2023   Pneumonia    Spondylolisthesis of lumbar region     Past Surgical History:  Procedure Laterality Date   APPENDECTOMY     CARDIOVERSION N/A 07/27/2023   Procedure: CARDIOVERSION;  Surgeon: Mona Vinie BROCKS, MD;  Location: [facility] INVASIVE CV LAB;  Service: Cardiovascular;  Laterality: N/A;   CATARACT EXTRACTION, BILATERAL     CERVICAL DISC ARTHROPLASTY     x 2   COLONOSCOPY     ENTEROSCOPY N/A 03/06/2021   Procedure: ENTEROSCOPY;  Surgeon: Rollin Dover, MD;  Location: [facility] ENDOSCOPY;  Service: Endoscopy;  Laterality: N/A;   ENTEROSCOPY N/A 11/05/2023   Procedure: ENTEROSCOPY;  Surgeon: Barnes Joann BROCKS, MD;  Location: [facility] ENDOSCOPY;  Service: Gastroenterology;  Laterality: N/A;   ESOPHAGOGASTRODUODENOSCOPY  07/19/00   HAND SURGERY Right 2014   HOT HEMOSTASIS N/A 03/06/2021   Procedure: HOT HEMOSTASIS (ARGON PLASMA COAGULATION/BICAP);  Surgeon: Rollin Dover, MD;  Location: [facility];  Service: Endoscopy;  Laterality: N/A;   HOT HEMOSTASIS N/A 11/05/2023   Procedure: EGD, WITH  ARGON PLASMA COAGULATION;  Surgeon: Federico Joann BROCKS, MD;  Location: [facility] ENDOSCOPY;  Service: Gastroenterology;  Laterality: N/A;   LAMINECTOMY  05/23/2017   L4-5, L5-S1    Family History  Problem Relation Age of Onset   Heart disease Mother    Heart disease Father    Cancer Sister        breast   Cancer Sister        lung   Heart disease Brother     Social History:  reports that she has been smoking cigarettes. She has a 45 pack-year smoking history. She has never used smokeless tobacco. She reports that she does  not drink alcohol and does not use drugs.  Allergies:  Allergies  Allergen Reactions   Bactrim [Sulfamethoxazole-Trimethoprim] Shortness Of Breath   Nicotine  Anaphylaxis, Itching, Rash and Other (See Comments)    Rash and itch with patch locally; Chewing the gum closed her throat   Atorvastatin  Other (See Comments)    Muscle aches and leg edema   Codeine Itching and Nausea And Vomiting   Chantix  [Varenicline  Tartrate] Hives, Itching, Nausea And Vomiting, Rash and Other (See Comments)    Mental status changes   Wellbutrin  [Bupropion ] Rash and Other (See Comments)    Slight rash around ankles   Zolpidem Tartrate Other (See Comments)    Bad dreams    Medications: Scheduled: Continuous:  sodium chloride       No results found for this or any previous visit (from the past 24 hours).   No results found.  ROS:  As stated above in the HPI otherwise negative.  There were no vitals taken for this visit.    PE: Gen: NAD, Alert and Oriented HEENT:  [place]/AT, EOMI Neck: Supple, no LAD Lungs: CTA Bilaterally CV: RRR without M/G/R ABD: Soft, NTND, +BS Ext: No C/C/E  Assessment/Plan: 1) IDA. 2) Weight loss. 3) History of AVMs.  Plan: 1) Enteroscopy with APC.  Jessamy Torosyan D 03/23/2024, 8:02 AM

## 2024-03-23 NOTE — Transfer of Care (Signed)
 Immediate Anesthesia Transfer of Care Note  Patient: Joann Barnes  Procedure(s) Performed: ENTEROSCOPY  Patient Location: PACU  Anesthesia Type:MAC  Level of Consciousness: awake, alert , and oriented  Airway & Oxygen  Therapy: Patient Spontanous Breathing and Patient connected to face mask oxygen   Post-op Assessment: Report given to RN and Post -op Vital signs reviewed and stable  Post vital signs: Reviewed and stable  Last Vitals:  Vitals Value Taken Time  BP 108/38 03/23/24 09:15  Temp    Pulse 59 03/23/24 09:17  Resp 20 03/23/24 09:17  SpO2 100 % 03/23/24 09:17  Vitals shown include unfiled device data.  Last Pain:  Vitals:   03/23/24 0755  TempSrc: Temporal  PainSc: 0-No pain         Complications: No notable events documented.

## 2024-03-23 NOTE — Anesthesia Preprocedure Evaluation (Addendum)
 Anesthesia Evaluation  Patient identified by MRN, date of birth, ID band Patient awake    Reviewed: Allergy & Precautions, H&P , NPO status , Patient's Chart, lab work & pertinent test results  Airway Mallampati: II  TM Distance: >3 FB Neck ROM: Full    Dental  (+) Edentulous Lower, Edentulous Upper   Pulmonary neg pulmonary ROS, COPD, Current Smoker and Patient abstained from smoking.   Pulmonary exam normal        Cardiovascular hypertension, Pt. on medications + angina  Normal cardiovascular exam+ dysrhythmias Atrial Fibrillation + Valvular Problems/Murmurs AS and MR   ECHO: 1. Left ventricular ejection fraction, by estimation, is 60 to 65%. The  left ventricle has normal function. The left ventricle has no regional  wall motion abnormalities. Left ventricular diastolic function could not  be evaluated. Elevated left atrial  pressure.   2. Right ventricular systolic function is normal. The right ventricular  size is normal.   3. Left atrial size was moderately dilated.   4. The mitral valve is normal in structure. Moderate mitral valve  regurgitation. No evidence of mitral stenosis.   5. Tricuspid valve regurgitation is moderate.   6. The aortic valve is calcified. Aortic valve regurgitation is moderate.  Mild to moderate aortic valve stenosis.   7. The inferior vena cava is normal in size with greater than 50%  respiratory variability, suggesting right atrial pressure of 3 mmHg.     Neuro/Psych  Headaches PSYCHIATRIC DISORDERS       Neuromuscular disease negative neurological ROS  negative psych ROS   GI/Hepatic negative GI ROS, Neg liver ROS, hiatal hernia,GERD  ,,  Endo/Other  negative endocrine ROSHypothyroidism    Renal/GU Renal InsufficiencyRenal diseasenegative Renal ROS  negative genitourinary   Musculoskeletal negative musculoskeletal ROS (+) Arthritis ,    Abdominal   Peds negative pediatric ROS (+)   Hematology negative hematology ROS (+) Blood dyscrasia (Xarelto ), anemia   Anesthesia Other Findings anemia, heme positive stool on Xarelto , Hx of AVM's  Reproductive/Obstetrics negative OB ROS                              Anesthesia Physical Anesthesia Plan  ASA: 4  Anesthesia Plan: MAC   Post-op Pain Management:    Induction: Intravenous  PONV Risk Score and Plan: 1 and Propofol  infusion and Treatment may vary due to age or medical condition  Airway Management Planned: Nasal Cannula  Additional Equipment:   Intra-op Plan:   Post-operative Plan:   Informed Consent: I have reviewed the patients History and Physical, chart, labs and discussed the procedure including the risks, benefits and alternatives for the proposed anesthesia with the patient or authorized representative who has indicated his/her understanding and acceptance.       Plan Discussed with: CRNA  Anesthesia Plan Comments:          Anesthesia Quick Evaluation

## 2024-03-23 NOTE — Anesthesia Postprocedure Evaluation (Signed)
 Anesthesia Post Note  Patient: Joann Barnes  Procedure(s) Performed: ENTEROSCOPY     Patient location during evaluation: PACU Anesthesia Type: MAC Level of consciousness: awake and alert Pain management: pain level controlled Vital Signs Assessment: post-procedure vital signs reviewed and stable Respiratory status: spontaneous breathing, nonlabored ventilation and respiratory function stable Cardiovascular status: blood pressure returned to baseline and stable Postop Assessment: no apparent nausea or vomiting Anesthetic complications: no   No notable events documented.  Last Vitals:  Vitals:   03/23/24 0931 03/23/24 0940  BP: (!) 128/41 (!) 122/48  Pulse: 70 65  Resp: 18 17  Temp:    SpO2: 95% 98%    Last Pain:  Vitals:   03/23/24 0940  TempSrc:   PainSc: 0-No pain                 Butler Levander Pinal

## 2024-03-23 NOTE — Op Note (Signed)
 [facility] Patient Name: Joann Barnes Procedure Date: 03/23/2024 MRN: [id number] Attending MD: Belvie Just , MD, [id number] Date of Birth: Mar 10, 1941 CSN: [id number] Age: 83 Admit Type: Outpatient Procedure:                Small bowel enteroscopy Indications:              Iron deficiency anemia Providers:                Belvie Just, MD, Jacquelyn Jaci Pierce, RN,                            Haskel Chris, Technician Referring MD:              Medicines:                 Complications:            No immediate complications. Estimated Blood Loss:     Estimated blood loss: none. Procedure:                Pre-Anesthesia Assessment:                           - Prior to the procedure, a History and Physical                            was performed, and patient medications and                            allergies were reviewed. The patient's tolerance of                            previous anesthesia was also reviewed. The risks                            and benefits of the procedure and the sedation                            options and risks were discussed with the patient.                            All questions were answered, and informed consent                            was obtained. Prior Anticoagulants: The patient has                            taken no anticoagulant or antiplatelet agents. ASA                            Grade Assessment: III - A patient with severe                            systemic disease. After reviewing the risks and  benefits, the patient was deemed in satisfactory                            condition to undergo the procedure.                           - Sedation was administered by an anesthesia                            professional. Deep sedation was attained.                           After obtaining informed consent, the endoscope was                            passed under direct vision. Throughout the                             procedure, the patient's blood pressure, pulse, and                            oxygen  saturations were monitored continuously. The                            [id number]-[id number] ([id number] Olympus colonoscope was                            introduced through the mouth and advanced to the                            mid-jejunum. The small bowel enteroscopy was                            accomplished without difficulty. The patient                            tolerated the procedure well. Scope In: Scope Out: Findings:      One benign-appearing, intrinsic mild stenosis was found at the       gastroesophageal junction. This stenosis measured 1.6 cm (inner       diameter) x less than one cm (in length). The stenosis was traversed.      A 3 cm hiatal hernia was present.      The stomach was normal.      A few angiodysplastic lesions with no bleeding were found in the second       portion of the duodenum and in the third portion of the duodenum.       Coagulation for tissue destruction using monopolar probe was successful.      A few angiodysplastic lesions with no bleeding were found in the       proximal jejunum and in the mid-jejunum. Coagulation for tissue       destruction using monopolar probe was successful. Area was tattooed with       an injection of 2 mL of Spot (carbon black).      Multiple nonbleeding AVMs were identified. All visualized AVMs were  ablated with APC. Impression:               - Benign-appearing esophageal stenosis.                           - 3 cm hiatal hernia.                           - Normal stomach.                           - A few non-bleeding angiodysplastic lesions in the                            duodenum. Treated with a monopolar probe.                           - A few non-bleeding angiodysplastic lesions in the                            jejunum. Treated with a monopolar probe. Tattooed.                           - No specimens  collected. Recommendation:           - Patient has a contact number available for                            emergencies. The signs and symptoms of potential                            delayed complications were discussed with the                            patient. Return to normal activities tomorrow.                            Written discharge instructions were provided to the                            patient.                           - Resume regular diet.                           - Continue present medications.                           - Iron supplementation.                           - VCE for her recurrent and severe IDA.                           - If the VCE is negative, consider a CTE. The  patient reported a 16 lbs weight loss. Procedure Code(s):        --- Professional ---                           [phone], Small intestinal endoscopy, enteroscopy                            beyond second portion of duodenum, not including                            ileum; with ablation of tumor(s), polyp(s), or                            other lesion(s) not amenable to removal by hot                            biopsy forceps, bipolar cautery or snare technique                           44799, Unlisted procedure, small intestine Diagnosis Code(s):        --- Professional ---                           K22.2, Esophageal obstruction                           K44.9, Diaphragmatic hernia without obstruction or                            gangrene                           K31.819, Angiodysplasia of stomach and duodenum                            without bleeding                           K55.20, Angiodysplasia of colon without hemorrhage                           D50.9, Iron deficiency anemia, unspecified CPT copyright 2022 American [employer] Association. All rights reserved. The codes documented in this report are preliminary and upon coder review may  be revised to meet  current compliance requirements. Belvie Just, MD Belvie Just, MD 03/23/2024 9:17:17 AM This report has been signed electronically. Number of Addenda: 0

## 2024-03-23 NOTE — Discharge Instructions (Signed)

## 2024-03-25 ENCOUNTER — Encounter (HOSPITAL_COMMUNITY): Payer: Self-pay | Admitting: Gastroenterology

## 2024-03-26 ENCOUNTER — Encounter: Payer: Self-pay | Admitting: Hematology and Oncology

## 2024-03-26 ENCOUNTER — Inpatient Hospital Stay: Admitting: Hematology and Oncology

## 2024-03-26 ENCOUNTER — Inpatient Hospital Stay

## 2024-03-26 VITALS — BP 146/52 | HR 86 | Temp 97.6°F | Resp 16 | Ht 63.0 in | Wt 118.2 lb

## 2024-03-26 DIAGNOSIS — D539 Nutritional anemia, unspecified: Secondary | ICD-10-CM | POA: Diagnosis not present

## 2024-03-26 DIAGNOSIS — D649 Anemia, unspecified: Secondary | ICD-10-CM

## 2024-03-26 LAB — CBC WITH DIFFERENTIAL/PLATELET
Abs Immature Granulocytes: 0.01 K/uL (ref 0.00–0.07)
Basophils Absolute: 0.1 K/uL (ref 0.0–0.1)
Basophils Relative: 1 %
Eosinophils Absolute: 0.2 K/uL (ref 0.0–0.5)
Eosinophils Relative: 3 %
HCT: 33.7 % — ABNORMAL LOW (ref 36.0–46.0)
Hemoglobin: 10.8 g/dL — ABNORMAL LOW (ref 12.0–15.0)
Immature Granulocytes: 0 %
Lymphocytes Relative: 13 %
Lymphs Abs: 0.6 K/uL — ABNORMAL LOW (ref 0.7–4.0)
MCH: 30.3 pg (ref 26.0–34.0)
MCHC: 32 g/dL (ref 30.0–36.0)
MCV: 94.4 fL (ref 80.0–100.0)
Monocytes Absolute: 0.5 K/uL (ref 0.1–1.0)
Monocytes Relative: 11 %
Neutro Abs: 3.2 K/uL (ref 1.7–7.7)
Neutrophils Relative %: 72 %
Platelets: 204 K/uL (ref 150–400)
RBC: 3.57 MIL/uL — ABNORMAL LOW (ref 3.87–5.11)
RDW: 20.3 % — ABNORMAL HIGH (ref 11.5–15.5)
WBC: 4.5 K/uL (ref 4.0–10.5)
nRBC: 0 % (ref 0.0–0.2)

## 2024-03-26 LAB — SAMPLE TO BLOOD BANK

## 2024-03-26 MED ORDER — SODIUM CHLORIDE 0.9% FLUSH: 10.0000 mL | INTRAVENOUS | Status: AC | PRN

## 2024-03-26 NOTE — Progress Notes (Signed)
 [facility] OFFICE PROGRESS NOTE  Joann Lombard, MD  ASSESSMENT & PLAN:  Assessment & Plan Deficiency anemia The cause of her anemia is multifactorial, likely combination of iron deficiency and anemia of chronic kidney disease She was recently given 3 units of blood and she had received 2 doses of intravenous iron through her primary care doctor She is still having active bleeding with ongoing passage of dark stool Her blood count today is low at 8  She had recent repeat EGD which showed multiple AVMs, treated Her risk for recurrent iron deficiency anemia is very high I recommend further intravenous iron infusion The most likely cause of her anemia is due to chronic blood loss/malabsorption syndrome. We discussed some of the risks, benefits, and alternatives of intravenous iron infusions. The patient is symptomatic from anemia and the iron level is critically low. She tolerated oral iron supplement poorly and desires to achieved higher levels of iron faster for adequate hematopoesis. Some of the side-effects to be expected including risks of infusion reactions, phlebitis, headaches, nausea and fatigue.  The patient is willing to proceed. Patient education material was dispensed.  Goal is to keep ferritin level greater than 50 and resolution of anemia I will order 2 doses of intravenous iron Feraheme  and see her back in 2 months for further follow-up     Orders Placed This Encounter  Procedures   Iron and Iron Binding Capacity ([facility] only)    Standing Status:   Standing    Number of Occurrences:   4    Expiration Date:   03/26/2025   Ferritin    Standing Status:   Standing    Number of Occurrences:   4    Expiration Date:   03/26/2025   Vitamin B12    Standing Status:   Standing    Number of Occurrences:   2    Expiration Date:   03/26/2025    INTERVAL HISTORY: Patient returns for recurrent anemia Symptoms of anemia includes fatigue We reviewed CBC,  EGD result  SUMMARY OF HEMATOLOGIC HISTORY:  She was found to have abnormal CBC from recent blood work I have the opportunity to review her CBC for the past few years At baseline on March 29, 2007, she had normal hemoglobin of 13.7 Starting in 2017, she has intermittent anemia On May 24, 2017, hemoglobin was 8.3 Since 2021, her hemoglobin has dropped to approximately 10.9 at the highest On February 03, 2021, she has severe anemia with hemoglobin of 6.8 Recently on November 03, 2023, she had low hemoglobin again at 6.0  Her most recent blood count by her primary care doctor came back 7.5 She complained of excessive fatigue and shortness of breath on minimal exertion She denies recent chest pain on exertion, pre-syncopal episodes, or palpitations. She had not noticed any recent bleeding such as epistaxis, hematuria or hematochezia.  However, she noted passage of dark stool recently The patient denies over the counter NSAID ingestion. She is not on antiplatelets agents but she is on chronic anticoagulation therapy with Xarelto  due to high risk of stroke from atrial fibrillation She had several endoscopy evaluation performed, the most recent one was on November 05, 2023 which show evidence of angioectasias and severe esophagitis She had no prior history or diagnosis of cancer. Her age appropriate screening programs are up-to-date. She denies any pica and eats a variety of diet. She never donated blood but had received blood transfusion The patient was prescribed oral iron supplements and  she takes up until her hospitalization in July.  She received blood transfusion and intravenous iron infusion On March 23, 2024, she underwent small bowel endoscopy and had multiple AVM treated  Lab Results  Component Value Date   VITAMINB12 136 (L) 02/05/2021   FERRITIN 14 02/24/2024   HGB 10.8 (L) 03/26/2024   RBC 3.57 (L) 03/26/2024   Vitals:   03/26/24 1029 03/26/24 1030  BP: (!) 158/69 (!) 146/52   Pulse: 86   Resp: 16   Temp: 97.6 F (36.4 C)   SpO2: 97%

## 2024-03-26 NOTE — Progress Notes (Signed)
 Per dr Lonn no transfusion today, will see pt in the office today

## 2024-03-26 NOTE — Assessment & Plan Note (Addendum)
 The cause of her anemia is multifactorial, likely combination of iron deficiency and anemia of chronic kidney disease She was recently given 3 units of blood and she had received 2 doses of intravenous iron through her primary care doctor She is still having active bleeding with ongoing passage of dark stool Her blood count today is low at 8  She had recent repeat EGD which showed multiple AVMs, treated Her risk for recurrent iron deficiency anemia is very high I recommend further intravenous iron infusion The most likely cause of her anemia is due to chronic blood loss/malabsorption syndrome. We discussed some of the risks, benefits, and alternatives of intravenous iron infusions. The patient is symptomatic from anemia and the iron level is critically low. She tolerated oral iron supplement poorly and desires to achieved higher levels of iron faster for adequate hematopoesis. Some of the side-effects to be expected including risks of infusion reactions, phlebitis, headaches, nausea and fatigue.  The patient is willing to proceed. Patient education material was dispensed.  Goal is to keep ferritin level greater than 50 and resolution of anemia I will order 2 doses of intravenous iron Feraheme  and see her back in 2 months for further follow-up

## 2024-03-28 ENCOUNTER — Telehealth: Payer: Self-pay | Admitting: Pharmacy Technician

## 2024-03-28 NOTE — Telephone Encounter (Signed)
 Auth Submission: NO AUTH NEEDED Site of care: Site of care: [facility] Payer: [employer] Medication & CPT/J Code(s) submitted: Feraheme  (ferumoxytol ) [id number] Diagnosis Code: D50.9 Route of submission (phone, fax, portal):  Phone # Fax # Auth type: Buy/Bill PB Units/visits requested: X2 DOSES Reference number:  Approval from: 03/28/24 to 05/02/24

## 2024-04-04 ENCOUNTER — Ambulatory Visit (INDEPENDENT_AMBULATORY_CARE_PROVIDER_SITE_OTHER)

## 2024-04-04 VITALS — BP 138/54 | HR 72 | Temp 98.2°F | Resp 16 | Ht 63.0 in | Wt 114.2 lb

## 2024-04-04 DIAGNOSIS — D508 Other iron deficiency anemias: Secondary | ICD-10-CM | POA: Diagnosis not present

## 2024-04-04 DIAGNOSIS — D539 Nutritional anemia, unspecified: Secondary | ICD-10-CM

## 2024-04-04 DIAGNOSIS — K5520 Angiodysplasia of colon without hemorrhage: Secondary | ICD-10-CM | POA: Diagnosis not present

## 2024-04-04 MED ORDER — DIPHENHYDRAMINE HCL 25 MG PO CAPS: 25.0000 mg | ORAL_CAPSULE | Freq: Once | ORAL | Status: DC

## 2024-04-04 MED ORDER — ACETAMINOPHEN 325 MG PO TABS: 650.0000 mg | ORAL_TABLET | Freq: Once | ORAL | Status: DC

## 2024-04-04 MED ORDER — SODIUM CHLORIDE 0.9 % IV SOLN
510.0000 mg | Freq: Once | INTRAVENOUS | Status: AC
  Administered 2024-04-04: 510 mg via INTRAVENOUS
  Filled 2024-04-04: qty 17

## 2024-04-04 NOTE — Progress Notes (Signed)
 Diagnosis: Iron Deficiency Anemia  Provider:  Praveen Mannam MD  Procedure: IV Infusion  IV Type: Peripheral, IV Location: L Forearm   Feraheme  (Ferumoxytol ), Dose: 510 mg  Infusion Start Time: 1507  Infusion Stop Time: 1524  Post Infusion IV Care: Patient declined observation and Peripheral IV Discontinued  Discharge: Condition: Good, Destination: Home . AVS Provided  Performed by:  Leita FORBES Miles, LPN

## 2024-04-05 DIAGNOSIS — L538 Other specified erythematous conditions: Secondary | ICD-10-CM | POA: Diagnosis not present

## 2024-04-05 DIAGNOSIS — Z09 Encounter for follow-up examination after completed treatment for conditions other than malignant neoplasm: Secondary | ICD-10-CM | POA: Diagnosis not present

## 2024-04-05 DIAGNOSIS — D0461 Carcinoma in situ of skin of right upper limb, including shoulder: Secondary | ICD-10-CM | POA: Diagnosis not present

## 2024-04-05 DIAGNOSIS — L820 Inflamed seborrheic keratosis: Secondary | ICD-10-CM | POA: Diagnosis not present

## 2024-04-05 DIAGNOSIS — Z872 Personal history of diseases of the skin and subcutaneous tissue: Secondary | ICD-10-CM | POA: Diagnosis not present

## 2024-04-05 DIAGNOSIS — D485 Neoplasm of uncertain behavior of skin: Secondary | ICD-10-CM | POA: Diagnosis not present

## 2024-04-11 ENCOUNTER — Ambulatory Visit (INDEPENDENT_AMBULATORY_CARE_PROVIDER_SITE_OTHER)

## 2024-04-11 VITALS — BP 121/69 | HR 76 | Temp 98.2°F | Resp 16 | Ht 63.0 in | Wt 111.0 lb

## 2024-04-11 DIAGNOSIS — K5520 Angiodysplasia of colon without hemorrhage: Secondary | ICD-10-CM | POA: Diagnosis not present

## 2024-04-11 DIAGNOSIS — D539 Nutritional anemia, unspecified: Secondary | ICD-10-CM | POA: Diagnosis not present

## 2024-04-11 MED ORDER — SODIUM CHLORIDE 0.9 % IV SOLN
510.0000 mg | Freq: Once | INTRAVENOUS | Status: AC
  Administered 2024-04-11: 510 mg via INTRAVENOUS
  Filled 2024-04-11: qty 17

## 2024-04-11 MED ORDER — ACETAMINOPHEN 325 MG PO TABS: 650.0000 mg | ORAL_TABLET | Freq: Once | ORAL | Status: DC

## 2024-04-11 MED ORDER — DIPHENHYDRAMINE HCL 25 MG PO CAPS: 25.0000 mg | ORAL_CAPSULE | Freq: Once | ORAL | Status: DC

## 2024-04-11 NOTE — Progress Notes (Signed)
 Diagnosis: Iron Deficiency Anemia  Provider:  Praveen Mannam MD  Procedure: IV Infusion  IV Type: Peripheral, IV Location: R Antecubital   Feraheme  (Ferumoxytol ), Dose: 510 mg  Infusion Start Time: 1255  Infusion Stop Time: 1310  Post Infusion IV Care: Patient declined observation and Peripheral IV Discontinued  Discharge: Condition: Good, Destination: Home . AVS Declined  Performed by:  Leita FORBES Miles, LPN

## 2024-04-27 NOTE — Progress Notes (Signed)
 "  [facility]  Urgent Care Provider Note   Provider at bedside: 3:34 PM  History obtained from the: Patient  HISTORY   PATIENT ID: Joann Barnes is a 83 y.o. female.  CHIEF COMPLAINT: Chief Complaint  Patient presents with   Animal Bite    Patient was bitten by her neighbors dog this morning. Patient knows her vaccines are up to date. Dog is up to date to vaccines. No fall.      ALLERGIES: Allergies[1]   PAST MEDICAL HISTORY: No past medical history on file.  CURRENT MEDICATIONS: Current Medications[2]  ROS  All other symptoms are reviewed and are negative except those listed in HPI   HPI   Joann Barnes is a 83 y.o. female  presents to Urgent care   History of Present Illness The patient with atrial fibrillation presents with a dog bite to the head.  Dog Bite - Bitten by nephew's [employer], causing a head laceration that bled. - Wound initially cleaned with a damp cloth but bled again en route to clinic. - Experienced headache managed with Tylenol . - Reported faintness after climbing stairs and sitting down. - Applying Neosporin to wound. - No other injuries or falls.  Atrial Fibrillation - Diagnosed with atrial fibrillation this year. - On reduced dose of Xarelto  5 mg. - Received 5 units of blood since 11/2023.  Using cream for skin cancer on forehead and ear. Recently received tetanus vaccine.  FAMILY HISTORY Sister bedridden at age 68 due to stroke and brain hemorrhage.  PHYSICAL EXAM   Vitals:   04/27/24 1521  BP: (!) 176/55  Pulse: 86  Resp: 16  Temp: 97.5 F (36.4 C)  TempSrc: Oral  SpO2: 99%  Weight: 49.9 kg (110 lb)  Height: 1.6 m (5' 3)     Physical Exam Vitals and nursing note reviewed.  Constitutional:      General: She is not in acute distress.    Appearance: Normal appearance. She is not ill-appearing, toxic-appearing or diaphoretic.  HENT:     Head: Normocephalic.     Comments: Scalp with small  amount of bruising and S shaped wound 2.5 cm mid scalp into hairline.  Active venous oozing without arterial bleeding, no evidence of foreign body.  Step-off deformities palpable in scalp.  Wound lays with margins connecting.  Smaller laceration 1.5 cm medial to left eye no active bleeding no obvious bruising.    Right Ear: External ear normal.     Left Ear: External ear normal.     Nose: Nose normal.     Mouth/Throat:     Mouth: Mucous membranes are moist.  Cardiovascular:     Rate and Rhythm: Normal rate and regular rhythm.  Pulmonary:     Effort: Pulmonary effort is normal. No respiratory distress.  Abdominal:     General: Bowel sounds are normal. There is no distension.     Palpations: Abdomen is soft.     Tenderness: There is no abdominal tenderness. There is no right CVA tenderness or left CVA tenderness.  Musculoskeletal:     Cervical back: Neck supple.  Skin:    General: Skin is warm and dry.     Capillary Refill: Capillary refill takes less than 2 seconds.  Neurological:     General: No focal deficit present.     Mental Status: She is alert and oriented to person, place, and time.  Psychiatric:        Mood and Affect: Mood normal.  Laceration repair  Date/Time: 04/27/2024 6:30 PM  Performed by: Channing Macario Sero, FNP Authorized by: Channing Macario Sero, FNP   Consent:    Consent obtained:  Verbal   Consent given by:  Patient   Risks, benefits, and alternatives were discussed: yes     Risks discussed:  Infection, pain, retained foreign body, poor cosmetic result, need for additional repair and poor wound healing   Alternatives discussed:  No treatment Universal protocol:    Procedure explained and questions answered to patient or proxy's satisfaction: yes   Anesthesia:    Anesthesia method:  None Laceration details:    Location:  Scalp   Scalp location:  Frontal   Length (cm):  3   Depth (mm):  2 Pre-procedure details:    Preparation:  Patient was prepped and  draped in usual sterile fashion Exploration:    Contaminated: no   Treatment:    Area cleansed with:  Chlorhexidine    Amount of cleaning:  Standard   Irrigation solution:  Sterile saline   Debridement:  None   Undermining:  None Skin repair:    Repair method:  Steri-Strips Approximation:    Approximation:  Close Repair type:    Repair type:  Simple Post-procedure details:    Dressing:  Non-adherent dressing   Procedure completion:  Tolerated well, no immediate complications     RESULTS  No results found for this visit on 04/27/24.   ASSESSMENT/PLAN/MDM   1. Animal bite   2. Facial laceration, initial encounter      Joann Barnes is a 83 y.o. female Coumadin for A-fib presents to Urgent care with dog bite to the upper scalp and forehead x 2 today by neighbors dog who is well vaccinated.  On exam patient appears in no acute distress she is alert and oriented x 3.  No neurodeficits are identified.  Blood pressure is elevated 176/55.  No midline spine tenderness.  No tachycardia or fever.  Wounds are slightly oozing but not actively bleeding.  See procedure note.  Wounds cleaned and Steri-Strips applied.  Compression dressing is placed and bleeding is controlled.  Wound care instructions are provided including head injury.  Patient will go to the emergency room if any concerning signs as discussed increasing headache visual changes      UC DISPOSITION   Follow up with PCP  Patient Instructions  Antibiotics to prevent infection although only 5% of dog bites become infected.  Keep wound clean and dry do not remove Steri-Strips and less concerning signs of infection and then the wound will need to heal by secondary intention or from the underside out.  Keep the Steri-Strips in place but if they fall off that is okay.  To ER for head CT to rule out skull fracture or intracranial bleeds if any concerning signs for head injury   Hand out provided, I discussed the findings today,  diagnosis/differential diagnosis, plan and red flags that require return for reevaluation with PCP,  Urgent care or EMERGENCY. Patient/representitive was agreeable to outlined plan. Questions were answered and patient is stable for discharge.  Provider time spent in patient care today, inclusive of but not limited to clinical reassessment, review of diagnostic studies, and discharge preparation, was less than 30 minutes.  This document was created using the aid of voice recognition Scientist, clinical (histocompatibility and immunogenetics).  Electrically signed by Channing Sero ENP-C MSN at 4:03 PM        [1] Allergies Allergen Reactions   Nicotine  Anaphylaxis, Itching, Other (See  Comments) and Rash    Rash and itch with patch locally; Chewing the gum closed her throat   Sulfamethoxazole-Trimethoprim Shortness Of Breath   Atorvastatin  Other (See Comments)    Muscle aches and leg edema   Codeine Itching and Nausea And Vomiting   Bupropion  Other (See Comments) and Rash    Slight rash around ankles   Varenicline  Tartrate Hives, Itching, Nausea And Vomiting, Other (See Comments) and Rash    Mental status changes   Zolpidem Tartrate Other (See Comments)    Bad dreams  [2]   acetaminophen  (TYLENOL ) 500 mg tablet   albuterol  1.25 mg/3 mL nebulizer solution   calcium  carbonate (TUMS) 500 mg (200 mg calcium ) chewable tablet   cholecalciferol (VITAMIN D3) 2,000 unit tablet   escitalopram  (LEXAPRO ) 20 mg tablet   famotidine  (PEPCID ) 40 mg tablet   fluorouraciL (EFUDEX) 5 % cream   fluticasone -umeclidinium-vilanterol (TRELEGY ELLIPTA) 100-62.5-25 mcg inhaler   furosemide  (LASIX ) 20 mg tablet   levothyroxine  (SYNTHROID ) 25 mcg tablet   pantoprazole  (PROTONIX ) 40 mg EC tablet   pravastatin  (PRAVACHOL ) 80 mg tablet   verapamil  SR (CALAN -SR) 120 mg CR tablet   Xarelto  15 mg tablet   zoledronic  acid-mannitoL-water (RECLAST ) 5 mg/100 mL pgbk "

## 2024-05-12 ENCOUNTER — Other Ambulatory Visit: Payer: Self-pay | Admitting: Cardiology

## 2024-05-12 DIAGNOSIS — I480 Paroxysmal atrial fibrillation: Secondary | ICD-10-CM

## 2024-05-25 ENCOUNTER — Inpatient Hospital Stay: Admitting: Hematology and Oncology

## 2024-05-25 ENCOUNTER — Inpatient Hospital Stay: Attending: Hematology and Oncology

## 2024-05-25 VITALS — BP 138/51 | HR 70 | Resp 18 | Ht 63.0 in | Wt 107.0 lb

## 2024-05-25 DIAGNOSIS — N189 Chronic kidney disease, unspecified: Secondary | ICD-10-CM | POA: Insufficient documentation

## 2024-05-25 DIAGNOSIS — Z7901 Long term (current) use of anticoagulants: Secondary | ICD-10-CM | POA: Insufficient documentation

## 2024-05-25 DIAGNOSIS — I4891 Unspecified atrial fibrillation: Secondary | ICD-10-CM | POA: Diagnosis not present

## 2024-05-25 DIAGNOSIS — D539 Nutritional anemia, unspecified: Secondary | ICD-10-CM

## 2024-05-25 DIAGNOSIS — D649 Anemia, unspecified: Secondary | ICD-10-CM

## 2024-05-25 LAB — VITAMIN B12: Vitamin B-12: 341 pg/mL (ref 180–914)

## 2024-05-25 LAB — IRON AND IRON BINDING CAPACITY (CC-WL,HP ONLY)
Iron: 56 ug/dL (ref 28–170)
Saturation Ratios: 23 % (ref 10.4–31.8)
TIBC: 246 ug/dL — ABNORMAL LOW (ref 250–450)
UIBC: 190 ug/dL

## 2024-05-25 LAB — CBC WITH DIFFERENTIAL/PLATELET
Abs Immature Granulocytes: 0.04 K/uL (ref 0.00–0.07)
Basophils Absolute: 0.1 K/uL (ref 0.0–0.1)
Basophils Relative: 1 %
Eosinophils Absolute: 0.2 K/uL (ref 0.0–0.5)
Eosinophils Relative: 3 %
HCT: 33.9 % — ABNORMAL LOW (ref 36.0–46.0)
Hemoglobin: 11.4 g/dL — ABNORMAL LOW (ref 12.0–15.0)
Immature Granulocytes: 1 %
Lymphocytes Relative: 9 %
Lymphs Abs: 0.7 K/uL (ref 0.7–4.0)
MCH: 30.8 pg (ref 26.0–34.0)
MCHC: 33.6 g/dL (ref 30.0–36.0)
MCV: 91.6 fL (ref 80.0–100.0)
Monocytes Absolute: 0.8 K/uL (ref 0.1–1.0)
Monocytes Relative: 10 %
Neutro Abs: 6 K/uL (ref 1.7–7.7)
Neutrophils Relative %: 76 %
Platelets: 244 K/uL (ref 150–400)
RBC: 3.7 MIL/uL — ABNORMAL LOW (ref 3.87–5.11)
RDW: 15.8 % — ABNORMAL HIGH (ref 11.5–15.5)
WBC: 7.7 K/uL (ref 4.0–10.5)
nRBC: 0 % (ref 0.0–0.2)

## 2024-05-25 LAB — FERRITIN: Ferritin: 645 ng/mL — ABNORMAL HIGH (ref 11–307)

## 2024-05-25 LAB — SAMPLE TO BLOOD BANK

## 2024-05-25 NOTE — Progress Notes (Signed)
" ° °  [facility] OFFICE PROGRESS NOTE  Joann Lombard, MD  ASSESSMENT & PLAN:  Assessment & Plan Deficiency anemia The cause of her anemia is multifactorial, likely combination of iron deficiency and anemia of chronic kidney disease In 2025, she received multiple units of blood transfusion and IV iron She had recent repeat EGD which showed multiple AVMs, treated Repeat blood count today show improvement of her hemoglobin Iron studies show adequate iron store She has no recent bleeding I plan to see her again in 6 months for further follow-up She does not need darbepoetin injection for anemia of chronic kidney disease as her hemoglobin is greater than 11 with adequate iron replacement     No orders of the defined types were placed in this encounter.   INTERVAL HISTORY: Patient returns for recurrent anemia Symptoms of anemia includes none We reviewed CBC and iron studies result  SUMMARY OF HEMATOLOGIC HISTORY:  She was found to have abnormal CBC from recent blood work I have the opportunity to review her CBC for the past few years At baseline on March 29, 2007, she had normal hemoglobin of 13.7 Starting in 2017, she has intermittent anemia On May 24, 2017, hemoglobin was 8.3 Since 2021, her hemoglobin has dropped to approximately 10.9 at the highest On February 03, 2021, she has severe anemia with hemoglobin of 6.8 Recently on November 03, 2023, she had low hemoglobin again at 6.0  Her most recent blood count by her primary care doctor came back 7.5 She complained of excessive fatigue and shortness of breath on minimal exertion She denies recent chest pain on exertion, pre-syncopal episodes, or palpitations. She had not noticed any recent bleeding such as epistaxis, hematuria or hematochezia.  However, she noted passage of dark stool recently The patient denies over the counter NSAID ingestion. She is not on antiplatelets agents but she is on chronic  anticoagulation therapy with Xarelto  due to high risk of stroke from atrial fibrillation She had several endoscopy evaluation performed, the most recent one was on November 05, 2023 which show evidence of angioectasias and severe esophagitis She had no prior history or diagnosis of cancer. Her age appropriate screening programs are up-to-date. She denies any pica and eats a variety of diet. She never donated blood but had received blood transfusion The patient was prescribed oral iron supplements and she takes up until her hospitalization in July.  She received blood transfusion and intravenous iron infusion On March 23, 2024, she underwent small bowel endoscopy and had multiple AVM treated She received multiple doses of intravenous iron in December 2025  Lab Results  Component Value Date   VITAMINB12 136 (L) 02/05/2021   FERRITIN 645 (H) 05/25/2024   HGB 11.4 (L) 05/25/2024   RBC 3.70 (L) 05/25/2024   Vitals:   05/25/24 1043  BP: (!) 138/51  Pulse: 70  Resp: 18  SpO2: 99%   "

## 2024-05-25 NOTE — Assessment & Plan Note (Addendum)
 The cause of her anemia is multifactorial, likely combination of iron deficiency and anemia of chronic kidney disease In 2025, she received multiple units of blood transfusion and IV iron She had recent repeat EGD which showed multiple AVMs, treated Repeat blood count today show improvement of her hemoglobin Iron studies show adequate iron store She has no recent bleeding I plan to see her again in 6 months for further follow-up She does not need darbepoetin injection for anemia of chronic kidney disease as her hemoglobin is greater than 11 with adequate iron replacement

## 2024-05-29 ENCOUNTER — Telehealth: Payer: Self-pay | Admitting: *Deleted

## 2024-05-29 NOTE — Telephone Encounter (Signed)
-----   Message from Almarie Bedford, MD sent at 05/25/2024  3:20 PM EST ----- Let her know iron and B12 levels are all good

## 2024-05-29 NOTE — Telephone Encounter (Signed)
Per Dr.Gorsuch, called pt with message below. Pt verbalized understanding 

## 2024-11-22 ENCOUNTER — Inpatient Hospital Stay: Attending: Hematology and Oncology

## 2024-11-22 ENCOUNTER — Inpatient Hospital Stay: Admitting: Hematology and Oncology
